# Patient Record
Sex: Male | Born: 1940
Health system: Southern US, Community
[De-identification: ages and names within clinical notes are randomized; demographics above are authoritative.]

## PROBLEM LIST (undated history)

## (undated) DIAGNOSIS — Z794 Long term (current) use of insulin: Secondary | ICD-10-CM

## (undated) DIAGNOSIS — Z973 Presence of spectacles and contact lenses: Secondary | ICD-10-CM

## (undated) DIAGNOSIS — M1990 Unspecified osteoarthritis, unspecified site: Secondary | ICD-10-CM

## (undated) DIAGNOSIS — Z972 Presence of dental prosthetic device (complete) (partial): Secondary | ICD-10-CM

## (undated) DIAGNOSIS — N182 Chronic kidney disease, stage 2 (mild): Secondary | ICD-10-CM

## (undated) DIAGNOSIS — H40003 Preglaucoma, unspecified, bilateral: Secondary | ICD-10-CM

## (undated) DIAGNOSIS — E782 Mixed hyperlipidemia: Secondary | ICD-10-CM

## (undated) DIAGNOSIS — N401 Enlarged prostate with lower urinary tract symptoms: Secondary | ICD-10-CM

## (undated) DIAGNOSIS — E119 Type 2 diabetes mellitus without complications: Secondary | ICD-10-CM

## (undated) DIAGNOSIS — E785 Hyperlipidemia, unspecified: Secondary | ICD-10-CM

## (undated) DIAGNOSIS — I10 Essential (primary) hypertension: Secondary | ICD-10-CM

## (undated) DIAGNOSIS — T8859XA Other complications of anesthesia, initial encounter: Secondary | ICD-10-CM

## (undated) HISTORY — PX: FOOT FRACTURE SURGERY: SHX645

## (undated) HISTORY — PX: OTHER SURGICAL HISTORY: SHX169

## (undated) HISTORY — DX: Essential (primary) hypertension: I10

## (undated) HISTORY — DX: Hyperlipidemia, unspecified: E78.5

## (undated) HISTORY — DX: Type 2 diabetes mellitus without complications: E11.9

---

## 2004-06-24 ENCOUNTER — Ambulatory Visit: Payer: Self-pay | Admitting: Family Medicine

## 2004-07-09 ENCOUNTER — Ambulatory Visit: Payer: Self-pay | Admitting: Family Medicine

## 2004-08-20 ENCOUNTER — Ambulatory Visit: Payer: Self-pay | Admitting: Family Medicine

## 2004-11-27 ENCOUNTER — Ambulatory Visit: Payer: Self-pay | Admitting: Family Medicine

## 2005-03-06 ENCOUNTER — Ambulatory Visit: Payer: Self-pay | Admitting: Family Medicine

## 2005-06-16 HISTORY — PX: INGUINAL HERNIA REPAIR: SUR1180

## 2005-06-17 ENCOUNTER — Ambulatory Visit: Payer: Self-pay | Admitting: Family Medicine

## 2005-07-15 ENCOUNTER — Ambulatory Visit: Payer: Self-pay | Admitting: Family Medicine

## 2005-10-02 ENCOUNTER — Ambulatory Visit: Payer: Self-pay | Admitting: Family Medicine

## 2006-01-08 ENCOUNTER — Ambulatory Visit: Payer: Self-pay | Admitting: Family Medicine

## 2006-04-10 ENCOUNTER — Ambulatory Visit: Payer: Self-pay | Admitting: Family Medicine

## 2006-04-16 ENCOUNTER — Ambulatory Visit: Payer: Self-pay | Admitting: Family Medicine

## 2006-04-30 ENCOUNTER — Ambulatory Visit: Payer: Self-pay | Admitting: Family Medicine

## 2006-09-30 ENCOUNTER — Ambulatory Visit: Payer: Self-pay | Admitting: Family Medicine

## 2006-12-08 ENCOUNTER — Ambulatory Visit: Payer: Self-pay | Admitting: Family Medicine

## 2008-11-20 ENCOUNTER — Ambulatory Visit (HOSPITAL_COMMUNITY): Admission: RE | Admit: 2008-11-20 | Discharge: 2008-11-20 | Payer: Self-pay | Admitting: Ophthalmology

## 2011-06-23 ENCOUNTER — Ambulatory Visit: Payer: Medicare PPO | Attending: Family Medicine | Admitting: Physical Therapy

## 2011-06-23 DIAGNOSIS — M545 Low back pain, unspecified: Secondary | ICD-10-CM | POA: Insufficient documentation

## 2011-06-23 DIAGNOSIS — IMO0001 Reserved for inherently not codable concepts without codable children: Secondary | ICD-10-CM | POA: Insufficient documentation

## 2011-06-23 DIAGNOSIS — R5381 Other malaise: Secondary | ICD-10-CM | POA: Insufficient documentation

## 2011-06-27 ENCOUNTER — Ambulatory Visit: Payer: Medicare PPO | Admitting: Physical Therapy

## 2011-07-02 ENCOUNTER — Ambulatory Visit: Payer: Medicare PPO | Admitting: Physical Therapy

## 2011-07-04 ENCOUNTER — Encounter: Payer: Medicare PPO | Admitting: *Deleted

## 2011-07-09 ENCOUNTER — Encounter: Payer: Medicare PPO | Admitting: Physical Therapy

## 2011-07-11 ENCOUNTER — Ambulatory Visit: Payer: Medicare PPO | Admitting: *Deleted

## 2011-07-16 ENCOUNTER — Ambulatory Visit: Payer: Medicare PPO | Admitting: Physical Therapy

## 2011-07-18 ENCOUNTER — Ambulatory Visit: Payer: Medicare PPO | Attending: Family Medicine | Admitting: Physical Therapy

## 2011-07-18 DIAGNOSIS — R5381 Other malaise: Secondary | ICD-10-CM | POA: Insufficient documentation

## 2011-07-18 DIAGNOSIS — IMO0001 Reserved for inherently not codable concepts without codable children: Secondary | ICD-10-CM | POA: Insufficient documentation

## 2011-07-18 DIAGNOSIS — M545 Low back pain, unspecified: Secondary | ICD-10-CM | POA: Insufficient documentation

## 2011-07-23 ENCOUNTER — Ambulatory Visit: Payer: Medicare PPO | Admitting: Physical Therapy

## 2011-07-25 ENCOUNTER — Ambulatory Visit: Payer: Medicare PPO | Admitting: Physical Therapy

## 2011-07-30 ENCOUNTER — Ambulatory Visit: Payer: Medicare PPO | Admitting: Physical Therapy

## 2011-08-01 ENCOUNTER — Ambulatory Visit: Payer: Medicare PPO | Admitting: *Deleted

## 2011-08-05 ENCOUNTER — Ambulatory Visit: Payer: Medicare PPO | Admitting: Physical Therapy

## 2011-08-08 ENCOUNTER — Encounter: Payer: Medicare PPO | Admitting: *Deleted

## 2013-11-02 DIAGNOSIS — E1121 Type 2 diabetes mellitus with diabetic nephropathy: Secondary | ICD-10-CM | POA: Insufficient documentation

## 2013-11-02 DIAGNOSIS — E1149 Type 2 diabetes mellitus with other diabetic neurological complication: Secondary | ICD-10-CM | POA: Insufficient documentation

## 2013-11-02 DIAGNOSIS — E119 Type 2 diabetes mellitus without complications: Secondary | ICD-10-CM | POA: Insufficient documentation

## 2013-11-02 DIAGNOSIS — B359 Dermatophytosis, unspecified: Secondary | ICD-10-CM | POA: Insufficient documentation

## 2013-11-02 DIAGNOSIS — I10 Essential (primary) hypertension: Secondary | ICD-10-CM | POA: Insufficient documentation

## 2014-08-17 ENCOUNTER — Encounter: Payer: Medicare HMO | Attending: "Endocrinology | Admitting: Nutrition

## 2014-08-17 DIAGNOSIS — E118 Type 2 diabetes mellitus with unspecified complications: Secondary | ICD-10-CM | POA: Insufficient documentation

## 2014-08-17 DIAGNOSIS — Z713 Dietary counseling and surveillance: Secondary | ICD-10-CM | POA: Diagnosis not present

## 2014-08-17 DIAGNOSIS — E1165 Type 2 diabetes mellitus with hyperglycemia: Secondary | ICD-10-CM

## 2014-08-17 DIAGNOSIS — IMO0002 Reserved for concepts with insufficient information to code with codable children: Secondary | ICD-10-CM

## 2014-08-17 NOTE — Progress Notes (Signed)
  Medical Nutrition Therapy:  Appt start time: 1400 end time:  1430.  Assessment:  Primary concerns today: Diabetes Type 2 Walk in from Dr. Dorris Fetch. Unable to obtain safety questions. Lives by himself and doesn't cook a lot. Willing to buy Healthy Choice or Smart Ones for meals. Likes to walk for exercise.   Unknown A1C other than it was more than 7% per patient.   PCP: Dr. Edrick Oh. Take medications as prescribed. PMH: Type 2 DM, HTN and Hyperlipidemia.  Preferred Learning Style:   Auditory  Visual  Hands on   Learning Readiness:  Not ready  Contemplating  Ready  Change in progress   MEDICATIONS: See list   DIETARY INTAKE:  Doesn't cook. Eats out a lot. Willing to change what he eats and make better choices to improve blood sugars. Wants to use Health Choice or Smart Ones for meals to improve food choices.  Usual physical activity: walks  Estimated energy needs: 1800 calories 200 g carbohydrates 135 g protein 50 g fat  Progress Towards Goal(s):  In progress.   Nutritional Diagnosis:  NB-1.1 Food and nutrition-related knowledge deficit As related to Diabetes.  As evidenced by A1C > 7%..    Intervention:  Nutrition counseling on meal planning, carb counting and MY Plate. Will educate on disease process and further education at next vist. Goals:  Follow The Plate Method  Choose Healthy Choice or Smart Ones for meals at lunch or dinner  Increase water intake  Increase fresh fruits and vegetables.  Cut out fried foods, sweets, cakes, cookies, pies, desserts and regular sodas or tea.  Do not skip meals.  Eat three meals per day  Monitor glucose levels as instructed by your doctor  Aim for 30 mins of physical activity daily  Write down what you are eating a few days before you come next visit.  Bring food record and glucose log to your next nutrition visit  Get A1C to 7% or below in the next three months.   Teaching Method Utilized:   Visual Auditory Hands on  Handouts given during visit include:  The Plate Method  The Meal Plan Card.  Barriers to learning/adherence to lifestyle change: none  Demonstrated degree of understanding via:  Teach Back   Monitoring/Evaluation:  Dietary intake, exercise, SBG and body weight in 1 month(s).

## 2014-08-17 NOTE — Patient Instructions (Signed)
Goals:  Follow The Plate Method  Choose Healthy Choice or Smart Ones for meals at lunch or dinner  Increase water intake  Increase fresh fruits and vegetables.  Cut out fried foods, sweets, cakes, cookies, pies, desserts and regular sodas or tea.  Do not skip meals.  Eat three meals per day  Monitor glucose levels as instructed by your doctor  Aim for 30 mins of physical activity daily  Write down what you are eating a few days before you come next visit.  Bring food record and glucose log to your next nutrition visit  Get A1C to 7% or below in the next three months.

## 2014-08-28 ENCOUNTER — Ambulatory Visit: Payer: Medicare PPO | Admitting: Nutrition

## 2014-09-18 ENCOUNTER — Encounter: Payer: Self-pay | Admitting: Nutrition

## 2014-09-18 ENCOUNTER — Encounter: Payer: Medicare HMO | Attending: "Endocrinology | Admitting: Nutrition

## 2014-09-18 VITALS — Ht 66.0 in | Wt 200.8 lb

## 2014-09-18 DIAGNOSIS — Z713 Dietary counseling and surveillance: Secondary | ICD-10-CM | POA: Diagnosis not present

## 2014-09-18 DIAGNOSIS — E118 Type 2 diabetes mellitus with unspecified complications: Secondary | ICD-10-CM | POA: Diagnosis present

## 2014-09-18 DIAGNOSIS — E1165 Type 2 diabetes mellitus with hyperglycemia: Secondary | ICD-10-CM

## 2014-09-18 DIAGNOSIS — IMO0002 Reserved for concepts with insufficient information to code with codable children: Secondary | ICD-10-CM

## 2014-09-18 DIAGNOSIS — E669 Obesity, unspecified: Secondary | ICD-10-CM | POA: Insufficient documentation

## 2014-09-18 DIAGNOSIS — Z6832 Body mass index (BMI) 32.0-32.9, adult: Secondary | ICD-10-CM | POA: Diagnosis not present

## 2014-09-18 NOTE — Patient Instructions (Signed)
Goals: Keep up the good work!!    Continue to Follow The Plate Method  Choose Healthy Choice or Smart Ones for meals at lunch or dinner  Increase fresh fruits and vegetables.  Cut out fried foods, sweets, cakes, cookies, pies, desserts and regular sodas or tea.  Do not skip meals.  Eat three meals per day  Aim for 30 mins of physical activity daily  Lose 2-3 lbs per month  Get A1C to 7% or below in the next three months.

## 2014-09-18 NOTE — Progress Notes (Signed)
  Medical Nutrition Therapy:  Appt start time: 1100 end time:  1115.  Assessment:  Primary concerns today: Diabetes Type 2 Cut out diet sodas, cut out sweets, desserts and fried foods. Eats Lean Cuisine for lunch and dinner since he doesn't cook much.  Occasionally testing blood sugars:  FBS 160's  A1C was 7%. Hasn't had it taken again til May. Has stopped taking Glipizide and has cut down of Metformin now 500 mg twice a day. Still taking Invokana 300 mg once a day. Drinking lots of water.   Has back problems and limited with walking. Goes to [employer] and works out. Feels better and stronger and has lost  6 lbs since last visit.     Taking 4000 mg fish oil daily. (Lovaza) and low dose Crestor.  Walk in from Dr. Dorris Fetch. Unable to obtain safety questions. Lives by himself and doesn't cook a lot. Willing to buy Healthy Choice or Smart Ones for meals. Likes to walk for exercise.  Most recent A1C was 8.2% per patient. Suppose to get it checked again in May with Dr. Dorris Fetch.  PCP: Dr. Edrick Oh. Take medications as prescribed. PMH: Type 2 DM, HTN and Hyperlipidemia.       Diet needs more fruit intake and more lower carb vegetables.   Preferred Learning Style:   Auditory  Visual  Hands on   Learning Readiness:  Ready  Change in progress   MEDICATIONS: See list   DIETARY INTAKE:  Has made a lot of good chances with his diet. Cut out junk food,sweets and processed foods for the most part.   Breakfast:  Old Fashion Oatmeal with apples, Or cherrios with Skim milk   Lunch:  Lean Cuisine OR boiled egg or sweet potato1/2, toss salad,  Water or UnSweet tea  Dinner:  [employer] Cruisine and boiled egg, and toss salad  Usual physical activity: walks  Estimated energy needs: 1800 calories 200 g carbohydrates 135 g protein 50 g fat  Progress Towards Goal(s):  In progress.   Nutritional Diagnosis:  NB-1.1 Food and nutrition-related knowledge deficit As related to Diabetes.  As evidenced by A1C >  7%..    Intervention:  Nutrition counseling on meal planning, carb counting and MY Plate. Stressed need for increased fruit and low carb vegetables.  Goals: Keep up the good work!!    Continue to Follow The Plate Method  Choose Healthy Choice or Smart Ones for meals at lunch or dinner  Increase fresh fruits and vegetables.  Cut out fried foods, sweets, cakes, cookies, pies, desserts and regular sodas or tea.  Do not skip meals.  Eat three meals per day  Aim for 30 mins of physical activity daily  Lose 2-3 lbs per month  Get A1C to 7% or below in the next three months.   Teaching Method Utilized:  Visual Auditory Hands on  Handouts given during visit include:  The Plate Method  The Meal Plan Card.  Barriers to learning/adherence to lifestyle change: none  Demonstrated degree of understanding via:  Teach Back   Monitoring/Evaluation:  Dietary intake, exercise, SBG and body weight in PRN.Marland Kitchen

## 2015-05-16 ENCOUNTER — Ambulatory Visit (INDEPENDENT_AMBULATORY_CARE_PROVIDER_SITE_OTHER): Payer: Medicare HMO | Admitting: "Endocrinology

## 2015-05-16 ENCOUNTER — Encounter: Payer: Self-pay | Admitting: "Endocrinology

## 2015-05-16 VITALS — BP 115/70 | HR 74 | Ht 66.0 in | Wt 195.0 lb

## 2015-05-16 DIAGNOSIS — I10 Essential (primary) hypertension: Secondary | ICD-10-CM | POA: Diagnosis not present

## 2015-05-16 DIAGNOSIS — IMO0001 Reserved for inherently not codable concepts without codable children: Secondary | ICD-10-CM | POA: Insufficient documentation

## 2015-05-16 DIAGNOSIS — E1165 Type 2 diabetes mellitus with hyperglycemia: Secondary | ICD-10-CM

## 2015-05-16 DIAGNOSIS — E785 Hyperlipidemia, unspecified: Secondary | ICD-10-CM | POA: Insufficient documentation

## 2015-05-16 DIAGNOSIS — E7800 Pure hypercholesterolemia, unspecified: Secondary | ICD-10-CM | POA: Insufficient documentation

## 2015-05-16 MED ORDER — CANAGLIFLOZIN 100 MG PO TABS: 100.0000 mg | ORAL_TABLET | Freq: Every day | ORAL | Status: DC

## 2015-05-16 NOTE — Patient Instructions (Signed)

## 2015-05-16 NOTE — Progress Notes (Signed)
Subjective:    Patient ID: Patrick Moss, male    DOB: 1941-03-26, PCP Sherrie Mustache, MD   Past Medical History  Diagnosis Date  . Diabetes mellitus without complication ([place])    No past surgical history on file. Social History   Social History  . Marital Status: Divorced    Spouse Name: N/A  . Number of Children: N/A  . Years of Education: N/A   Social History Main Topics  . Smoking status: Never Smoker   . Smokeless tobacco: None  . Alcohol Use: None  . Drug Use: None  . Sexual Activity: Not Asked   Other Topics Concern  . None   Social History Narrative   Outpatient Encounter Prescriptions as of 05/16/2015  Medication Sig  . amLODipine (NORVASC) 5 MG tablet Take 5 mg by mouth daily.  Marland Kitchen aspirin 81 MG tablet Take 81 mg by mouth daily.  . canagliflozin (INVOKANA) 100 MG TABS tablet Take 1 tablet (100 mg total) by mouth daily before breakfast.  . losartan-hydrochlorothiazide (HYZAAR) 100-25 MG per tablet Take 1 tablet by mouth daily.  . metformin (FORTAMET) 1000 MG (OSM) 24 hr tablet Take 1,000 mg by mouth daily with breakfast.  . omega-3 acid ethyl esters (LOVAZA) 1 G capsule Take by mouth 2 (two) times daily.  . Omega-3 Fatty Acids (FISH OIL) 1000 MG CAPS Take 1,000 mg by mouth 2 (two) times daily.  . rosuvastatin (CRESTOR) 5 MG tablet Take 5 mg by mouth daily.  . [DISCONTINUED] canagliflozin (INVOKANA) 300 MG TABS tablet Take 300 mg by mouth daily before breakfast.  . [DISCONTINUED] glipiZIDE (GLUCOTROL) 5 MG tablet Take by mouth daily before breakfast.   No facility-administered encounter medications on file as of 05/16/2015.   ALLERGIES: Allergies not on file VACCINATION STATUS:  There is no immunization history on file for this patient.  Diabetes He presents for his follow-up diabetic visit. He has type 2 diabetes mellitus. Onset time: He was diagnosed at approximate age of 72 years. His disease course has been improving. There are no  hypoglycemic associated symptoms. Pertinent negatives for hypoglycemia include no confusion, headaches, pallor or seizures. There are no diabetic associated symptoms. Pertinent negatives for diabetes include no chest pain, no fatigue, no polydipsia, no polyphagia, no polyuria and no weakness. There are no hypoglycemic complications. Symptoms are improving. There are no diabetic complications. Risk factors for coronary artery disease include diabetes mellitus, dyslipidemia, male sex, obesity and sedentary lifestyle. He is compliant with treatment most of the time. His weight is decreasing steadily. He is following a generally unhealthy diet. He participates in exercise intermittently. His overall blood glucose range is 140-180 mg/dl. An ACE inhibitor/angiotensin II receptor blocker is being taken.  Hyperlipidemia This is a chronic problem. The current episode started more than 1 year ago. Pertinent negatives include no chest pain, myalgias or shortness of breath. Current antihyperlipidemic treatment includes statins. Risk factors for coronary artery disease include dyslipidemia, hypertension, male sex, obesity and a sedentary lifestyle.  Hypertension Pertinent negatives include no chest pain, headaches, neck pain, palpitations or shortness of breath.     Review of Systems  Constitutional: Negative for fatigue and unexpected weight change.  HENT: Negative for dental problem, mouth sores and trouble swallowing.   Eyes: Negative for visual disturbance.  Respiratory: Negative for cough, choking, chest tightness, shortness of breath and wheezing.   Cardiovascular: Negative for chest pain, palpitations and leg swelling.  Gastrointestinal: Negative for nausea, vomiting, abdominal pain, diarrhea, constipation and abdominal  distention.  Endocrine: Negative for polydipsia, polyphagia and polyuria.  Genitourinary: Negative for dysuria, urgency, hematuria and flank pain.  Musculoskeletal: Negative for myalgias,  back pain, gait problem and neck pain.  Skin: Negative for pallor, rash and wound.  Neurological: Negative for seizures, syncope, weakness, numbness and headaches.  Psychiatric/Behavioral: Negative.  Negative for confusion and dysphoric mood.    Objective:    BP 115/70 mmHg  Pulse 74  Ht 5\' 6"  (1.676 m)  Wt 195 lb (88.451 kg)  BMI 31.49 kg/m2  Wt Readings from Last 3 Encounters:  05/16/15 195 lb (88.451 kg)  09/18/14 200 lb 12.8 oz (91.082 kg)    Physical Exam  Constitutional: He is oriented to person, place, and time. He appears well-developed and well-nourished. He is cooperative. No distress.  HENT:  Head: Normocephalic and atraumatic.  Eyes: EOM are normal.  Neck: Normal range of motion. Neck supple. No tracheal deviation present. No thyromegaly present.  Cardiovascular: Normal rate, S1 normal, S2 normal and normal heart sounds.  Exam reveals no gallop.   No murmur heard. Pulses:      Dorsalis pedis pulses are 1+ on the right side, and 1+ on the left side.       Posterior tibial pulses are 1+ on the right side, and 1+ on the left side.  Pulmonary/Chest: Breath sounds normal. No respiratory distress. He has no wheezes.  Abdominal: Soft. Bowel sounds are normal. He exhibits no distension. There is no tenderness. There is no guarding and no CVA tenderness.  Musculoskeletal: He exhibits no edema.       Right shoulder: He exhibits no swelling and no deformity.  Neurological: He is alert and oriented to person, place, and time. He has normal strength and normal reflexes. No cranial nerve deficit or sensory deficit. Gait normal.  Skin: Skin is warm and dry. No rash noted. No cyanosis. Nails show no clubbing.  Psychiatric: He has a normal mood and affect. His speech is normal and behavior is normal. Judgment and thought content normal. Cognition and memory are normal.    Assessment & Plan:   1. Uncontrolled type 2 diabetes mellitus without complication, without long-term current  use of insulin (HCC)   Pt remains at a high risk for more acute and chronic complications of diabetes which include CAD, CVA, CKD, retinopathy, and neuropathy. These are all discussed in detail with the patient.  Patient came with improved A1c of 7.6%.  Recent labs reviewed.   - I have re-counseled the patient on diet management and weight loss  by adopting a carbohydrate restricted / protein rich  Diet.  - Suggestion is made for patient to avoid simple carbohydrates   from their diet including Cakes , Desserts, Ice Cream,  Soda (  diet and regular) , Sweet Tea , Candies,  Chips, Cookies, Artificial Sweeteners,   and "Sugar-free" Products .  This will help patient to have stable blood glucose profile and potentially avoid unintended  Weight gain.  - Patient is advised to stick to a routine mealtimes to eat 3 meals  a day and avoid unnecessary snacks ( to snack only to correct hypoglycemia).  - The patient  has been  scheduled with Jearld Fenton, RDN, CDE for individualized DM education.  - I have approached patient with the following individualized plan to manage diabetes and patient agrees.  -I will continue MTF ER 1000mg  po qday, Invokana  100 mg po qam.  - SE discussed with the patient. He will need basal  insulin if he  loses control  - Patient specific target  for A1c; LDL, HDL, Triglycerides, and  Waist Circumference were discussed in detail.  2) BP/HTN: Controlled. Continue current medications including ACEI/ARB. 3) Lipids/HPL:  continue statins. 4)  Weight/Diet: CDE consult in progress, exercise, and carbohydrates information provided.  5) Chronic Care/Health Maintenance:  -Patient is on ACEI/ARB and Statin medications and encouraged to continue to follow up with Ophthalmology, Podiatrist at least yearly or according to recommendations, and advised to  stay away from smoking. I have recommended yearly flu vaccine and pneumonia vaccination at least every 5 years; moderate intensity  exercise for up to 150 minutes weekly; and  sleep for at least 7 hours a day.  - 25 minutes of time was spent on the care of this patient , 50% of which was applied for counseling on diabetes complications and their preventions.  - I advised patient to maintain close follow up with Sherrie Mustache, MD for primary care needs.  Patient is asked to bring meter and  blood glucose logs during their next visit.   Follow up plan: -Return in about 3 months (around 08/14/2015) for follow up with pre-visit labs, diabetes, high blood pressure, high cholesterol.  Glade Lloyd, MD Phone: [phone]  Fax: [phone]   05/16/2015, 2:19 PM

## 2015-08-14 ENCOUNTER — Ambulatory Visit: Payer: Medicare HMO | Admitting: "Endocrinology

## 2015-09-12 ENCOUNTER — Ambulatory Visit (INDEPENDENT_AMBULATORY_CARE_PROVIDER_SITE_OTHER): Payer: Medicare HMO | Admitting: "Endocrinology

## 2015-09-12 ENCOUNTER — Encounter: Payer: Self-pay | Admitting: "Endocrinology

## 2015-09-12 VITALS — BP 124/78 | HR 69 | Ht 66.0 in | Wt 189.0 lb

## 2015-09-12 DIAGNOSIS — E785 Hyperlipidemia, unspecified: Secondary | ICD-10-CM

## 2015-09-12 DIAGNOSIS — I10 Essential (primary) hypertension: Secondary | ICD-10-CM | POA: Diagnosis not present

## 2015-09-12 DIAGNOSIS — E1165 Type 2 diabetes mellitus with hyperglycemia: Secondary | ICD-10-CM

## 2015-09-12 DIAGNOSIS — IMO0001 Reserved for inherently not codable concepts without codable children: Secondary | ICD-10-CM

## 2015-09-12 NOTE — Patient Instructions (Signed)

## 2015-09-12 NOTE — Progress Notes (Signed)
Subjective:    Patient ID: Patrick Moss, male    DOB: 03-18-1941, PCP Sherrie Mustache, MD   Past Medical History  Diagnosis Date  . Diabetes mellitus without complication ([place])   . Hyperlipidemia   . Hypertension    Past Surgical History  Procedure Laterality Date  . R foot fx     Social History   Social History  . Marital Status: Divorced    Spouse Name: N/A  . Number of Children: N/A  . Years of Education: N/A   Social History Main Topics  . Smoking status: Never Smoker   . Smokeless tobacco: Not on file  . Alcohol Use: Not on file  . Drug Use: Not on file  . Sexual Activity: Not on file   Other Topics Concern  . Not on file   Social History Narrative   Outpatient Encounter Prescriptions as of 09/12/2015  Medication Sig  . amLODipine (NORVASC) 5 MG tablet Take 5 mg by mouth daily.  Marland Kitchen aspirin 81 MG tablet Take 81 mg by mouth daily.  . canagliflozin (INVOKANA) 100 MG TABS tablet Take 1 tablet (100 mg total) by mouth daily before breakfast.  . losartan-hydrochlorothiazide (HYZAAR) 100-25 MG per tablet Take 1 tablet by mouth daily.  . metFORMIN (GLUCOPHAGE-XR) 500 MG 24 hr tablet Take 1,000 mg by mouth daily after breakfast.  . Omega-3 Fatty Acids (FISH OIL) 1000 MG CAPS Take 1,000 mg by mouth 2 (two) times daily.  . rosuvastatin (CRESTOR) 5 MG tablet Take 5 mg by mouth daily.  . [DISCONTINUED] metformin (FORTAMET) 1000 MG (OSM) 24 hr tablet Take 1,000 mg by mouth daily with breakfast.  . [DISCONTINUED] omega-3 acid ethyl esters (LOVAZA) 1 G capsule Take by mouth 2 (two) times daily.   No facility-administered encounter medications on file as of 09/12/2015.   ALLERGIES: No Known Allergies VACCINATION STATUS:  There is no immunization history on file for this patient.  Diabetes He presents for his follow-up diabetic visit. He has type 2 diabetes mellitus. Onset time: He was diagnosed at approximate age of 58 years. His disease course has been  improving. There are no hypoglycemic associated symptoms. Pertinent negatives for hypoglycemia include no confusion, headaches, pallor or seizures. There are no diabetic associated symptoms. Pertinent negatives for diabetes include no chest pain, no fatigue, no polydipsia, no polyphagia, no polyuria and no weakness. There are no hypoglycemic complications. Symptoms are improving. There are no diabetic complications. Risk factors for coronary artery disease include diabetes mellitus, dyslipidemia, male sex, obesity and sedentary lifestyle. He is compliant with treatment most of the time. His weight is decreasing steadily. He is following a generally unhealthy diet. He participates in exercise intermittently. His overall blood glucose range is 140-180 mg/dl. An ACE inhibitor/angiotensin II receptor blocker is being taken.  Hyperlipidemia This is a chronic problem. The current episode started more than 1 year ago. Pertinent negatives include no chest pain, myalgias or shortness of breath. Current antihyperlipidemic treatment includes statins. Risk factors for coronary artery disease include dyslipidemia, hypertension, male sex, obesity and a sedentary lifestyle.  Hypertension Pertinent negatives include no chest pain, headaches, neck pain, palpitations or shortness of breath.     Review of Systems  Constitutional: Negative for fatigue and unexpected weight change.  HENT: Negative for dental problem, mouth sores and trouble swallowing.   Eyes: Negative for visual disturbance.  Respiratory: Negative for cough, choking, chest tightness, shortness of breath and wheezing.   Cardiovascular: Negative for chest pain, palpitations and  leg swelling.  Gastrointestinal: Negative for nausea, vomiting, abdominal pain, diarrhea, constipation and abdominal distention.  Endocrine: Negative for polydipsia, polyphagia and polyuria.  Genitourinary: Negative for dysuria, urgency, hematuria and flank pain.  Musculoskeletal:  Negative for myalgias, back pain, gait problem and neck pain.  Skin: Negative for pallor, rash and wound.  Neurological: Negative for seizures, syncope, weakness, numbness and headaches.  Psychiatric/Behavioral: Negative.  Negative for confusion and dysphoric mood.    Objective:    BP 124/78 mmHg  Pulse 69  Ht 5\' 6"  (1.676 m)  Wt 189 lb (85.73 kg)  BMI 30.52 kg/m2  SpO2 98%  Wt Readings from Last 3 Encounters:  09/12/15 189 lb (85.73 kg)  05/16/15 195 lb (88.451 kg)  09/18/14 200 lb 12.8 oz (91.082 kg)    Physical Exam  Constitutional: He is oriented to person, place, and time. He appears well-developed and well-nourished. He is cooperative. No distress.  HENT:  Head: Normocephalic and atraumatic.  Eyes: EOM are normal.  Neck: Normal range of motion. Neck supple. No tracheal deviation present. No thyromegaly present.  Cardiovascular: Normal rate, S1 normal, S2 normal and normal heart sounds.  Exam reveals no gallop.   No murmur heard. Pulses:      Dorsalis pedis pulses are 1+ on the right side, and 1+ on the left side.       Posterior tibial pulses are 1+ on the right side, and 1+ on the left side.  Pulmonary/Chest: Breath sounds normal. No respiratory distress. He has no wheezes.  Abdominal: Soft. Bowel sounds are normal. He exhibits no distension. There is no tenderness. There is no guarding and no CVA tenderness.  Musculoskeletal: He exhibits no edema.       Right shoulder: He exhibits no swelling and no deformity.  Neurological: He is alert and oriented to person, place, and time. He has normal strength and normal reflexes. No cranial nerve deficit or sensory deficit. Gait normal.  Skin: Skin is warm and dry. No rash noted. No cyanosis. Nails show no clubbing.  Psychiatric: He has a normal mood and affect. His speech is normal and behavior is normal. Judgment and thought content normal. Cognition and memory are normal.    Assessment & Plan:   1. Uncontrolled type 2  diabetes mellitus without complication, without long-term current use of insulin (HCC)   Pt remains at a high risk for more acute and chronic complications of diabetes which include CAD, CVA, CKD, retinopathy, and neuropathy. These are all discussed in detail with the patient.  Patient came with improved A1c of 7.3% Overall improving from 8.9%.  Recent labs reviewed.   - I have re-counseled the patient on diet management and weight loss  by adopting a carbohydrate restricted / protein rich  Diet.  - Suggestion is made for patient to avoid simple carbohydrates   from their diet including Cakes , Desserts, Ice Cream,  Soda (  diet and regular) , Sweet Tea , Candies,  Chips, Cookies, Artificial Sweeteners,   and "Sugar-free" Products .  This will help patient to have stable blood glucose profile and potentially avoid unintended  Weight gain.  - Patient is advised to stick to a routine mealtimes to eat 3 meals  a day and avoid unnecessary snacks ( to snack only to correct hypoglycemia).  - The patient  has been  scheduled with Jearld Fenton, RDN, CDE for individualized DM education.  - I have approached patient with the following individualized plan to manage diabetes and patient  agrees.  -I will continue MTF ER 1000mg  po qday, Invokana  100 mg po qam.  - SE discussed with the patient. He will need basal insulin if he  loses control  - Patient specific target  for A1c; LDL, HDL, Triglycerides, and  Waist Circumference were discussed in detail.  2) BP/HTN: Controlled. Continue current medications including ACEI/ARB. 3) Lipids/HPL:  continue statins. 4)  Weight/Diet: CDE consult in progress, exercise, and carbohydrates information provided.  5) Chronic Care/Health Maintenance:  -Patient is on ACEI/ARB and Statin medications and encouraged to continue to follow up with Ophthalmology, Podiatrist at least yearly or according to recommendations, and advised to  stay away from smoking. I have  recommended yearly flu vaccine and pneumonia vaccination at least every 5 years; moderate intensity exercise for up to 150 minutes weekly; and  sleep for at least 7 hours a day.  - 25 minutes of time was spent on the care of this patient , 50% of which was applied for counseling on diabetes complications and their preventions.  - I advised patient to maintain close follow up with Sherrie Mustache, MD for primary care needs.  Patient is asked to bring meter and  blood glucose logs during their next visit.   Follow up plan: -Return in about 3 months (around 12/13/2015) for diabetes, high blood pressure, high cholesterol, follow up with pre-visit labs.  Glade Lloyd, MD Phone: [phone]  Fax: [phone]   09/12/2015, 4:35 PM

## 2015-12-05 LAB — HEMOGLOBIN A1C: HEMOGLOBIN A1C: 8.5

## 2015-12-17 ENCOUNTER — Ambulatory Visit (INDEPENDENT_AMBULATORY_CARE_PROVIDER_SITE_OTHER): Payer: Medicare HMO | Admitting: "Endocrinology

## 2015-12-17 ENCOUNTER — Encounter: Payer: Self-pay | Admitting: "Endocrinology

## 2015-12-17 VITALS — BP 141/80 | HR 72 | Ht 66.0 in | Wt 195.0 lb

## 2015-12-17 DIAGNOSIS — E1165 Type 2 diabetes mellitus with hyperglycemia: Secondary | ICD-10-CM

## 2015-12-17 DIAGNOSIS — E785 Hyperlipidemia, unspecified: Secondary | ICD-10-CM | POA: Diagnosis not present

## 2015-12-17 DIAGNOSIS — IMO0001 Reserved for inherently not codable concepts without codable children: Secondary | ICD-10-CM

## 2015-12-17 DIAGNOSIS — I10 Essential (primary) hypertension: Secondary | ICD-10-CM

## 2015-12-17 NOTE — Progress Notes (Signed)
Subjective:    Patient ID: Patrick Moss, male    DOB: 02/14/1941, PCP Sherrie Mustache, MD   Past Medical History  Diagnosis Date  . Diabetes mellitus without complication ([place])   . Hyperlipidemia   . Hypertension    Past Surgical History  Procedure Laterality Date  . R foot fx     Social History   Social History  . Marital Status: Divorced    Spouse Name: N/A  . Number of Children: N/A  . Years of Education: N/A   Social History Main Topics  . Smoking status: Never Smoker   . Smokeless tobacco: None  . Alcohol Use: None  . Drug Use: None  . Sexual Activity: Not Asked   Other Topics Concern  . None   Social History Narrative   Outpatient Encounter Prescriptions as of 12/17/2015  Medication Sig  . amLODipine (NORVASC) 5 MG tablet Take 5 mg by mouth daily.  Marland Kitchen aspirin 81 MG tablet Take 81 mg by mouth daily.  . canagliflozin (INVOKANA) 100 MG TABS tablet Take 1 tablet (100 mg total) by mouth daily before breakfast.  . losartan-hydrochlorothiazide (HYZAAR) 100-25 MG per tablet Take 1 tablet by mouth daily.  . metFORMIN (GLUCOPHAGE-XR) 500 MG 24 hr tablet Take 1,000 mg by mouth daily after breakfast.  . Omega-3 Fatty Acids (FISH OIL) 1000 MG CAPS Take 1,000 mg by mouth 2 (two) times daily.  . rosuvastatin (CRESTOR) 5 MG tablet Take 5 mg by mouth daily.   No facility-administered encounter medications on file as of 12/17/2015.   ALLERGIES: No Known Allergies VACCINATION STATUS:  There is no immunization history on file for this patient.  Diabetes He presents for his follow-up diabetic visit. He has type 2 diabetes mellitus. Onset time: He was diagnosed at approximate age of 80 years. His disease course has been worsening. There are no hypoglycemic associated symptoms. Pertinent negatives for hypoglycemia include no confusion, headaches, pallor or seizures. There are no diabetic associated symptoms. Pertinent negatives for diabetes include no chest pain, no  fatigue, no polydipsia, no polyphagia, no polyuria and no weakness. There are no hypoglycemic complications. Symptoms are worsening. There are no diabetic complications. Risk factors for coronary artery disease include diabetes mellitus, dyslipidemia, male sex, obesity and sedentary lifestyle. He is compliant with treatment most of the time. His weight is decreasing steadily. He is following a generally unhealthy diet. He participates in exercise intermittently. An ACE inhibitor/angiotensin II receptor blocker is being taken.  Hyperlipidemia This is a chronic problem. The current episode started more than 1 year ago. Pertinent negatives include no chest pain, myalgias or shortness of breath. Current antihyperlipidemic treatment includes statins. Risk factors for coronary artery disease include dyslipidemia, hypertension, male sex, obesity and a sedentary lifestyle.  Hypertension Pertinent negatives include no chest pain, headaches, neck pain, palpitations or shortness of breath.     Review of Systems  Constitutional: Negative for fatigue and unexpected weight change.  HENT: Negative for dental problem, mouth sores and trouble swallowing.   Eyes: Negative for visual disturbance.  Respiratory: Negative for cough, choking, chest tightness, shortness of breath and wheezing.   Cardiovascular: Negative for chest pain, palpitations and leg swelling.  Gastrointestinal: Negative for nausea, vomiting, abdominal pain, diarrhea, constipation and abdominal distention.  Endocrine: Negative for polydipsia, polyphagia and polyuria.  Genitourinary: Negative for dysuria, urgency, hematuria and flank pain.  Musculoskeletal: Negative for myalgias, back pain, gait problem and neck pain.  Skin: Negative for pallor, rash and wound.  Neurological: Negative for seizures, syncope, weakness, numbness and headaches.  Psychiatric/Behavioral: Negative.  Negative for confusion and dysphoric mood.    Objective:    BP  141/80 mmHg  Pulse 72  Ht 5\' 6"  (1.676 m)  Wt 195 lb (88.451 kg)  BMI 31.49 kg/m2  Wt Readings from Last 3 Encounters:  12/17/15 195 lb (88.451 kg)  09/12/15 189 lb (85.73 kg)  05/16/15 195 lb (88.451 kg)    Physical Exam  Constitutional: He is oriented to person, place, and time. He appears well-developed and well-nourished. He is cooperative. No distress.  HENT:  Head: Normocephalic and atraumatic.  Eyes: EOM are normal.  Neck: Normal range of motion. Neck supple. No tracheal deviation present. No thyromegaly present.  Cardiovascular: Normal rate, S1 normal, S2 normal and normal heart sounds.  Exam reveals no gallop.   No murmur heard. Pulses:      Dorsalis pedis pulses are 1+ on the right side, and 1+ on the left side.       Posterior tibial pulses are 1+ on the right side, and 1+ on the left side.  Pulmonary/Chest: Breath sounds normal. No respiratory distress. He has no wheezes.  Abdominal: Soft. Bowel sounds are normal. He exhibits no distension. There is no tenderness. There is no guarding and no CVA tenderness.  Musculoskeletal: He exhibits no edema.       Right shoulder: He exhibits no swelling and no deformity.  Neurological: He is alert and oriented to person, place, and time. He has normal strength and normal reflexes. No cranial nerve deficit or sensory deficit. Gait normal.  Skin: Skin is warm and dry. No rash noted. No cyanosis. Nails show no clubbing.  Psychiatric: He has a normal mood and affect. His speech is normal and behavior is normal. Judgment and thought content normal. Cognition and memory are normal.    Assessment & Plan:   1. Uncontrolled type 2 diabetes mellitus without complication, without long-term current use of insulin (HCC)   Pt remains at a high risk for more acute and chronic complications of diabetes which include CAD, CVA, CKD, retinopathy, and neuropathy. These are all discussed in detail with the patient.  Patient came with  A1c of 8.5%  increasing from  7.3% ,  after Overall improving from 8.9%.  Recent labs reviewed.   - I have re-counseled the patient on diet management and weight loss  by adopting a carbohydrate restricted / protein rich  Diet.  - Suggestion is made for patient to avoid simple carbohydrates   from their diet including Cakes , Desserts, Ice Cream,  Soda (  diet and regular) , Sweet Tea , Candies,  Chips, Cookies, Artificial Sweeteners,   and "Sugar-free" Products .  This will help patient to have stable blood glucose profile and potentially avoid unintended  Weight gain.  - Patient is advised to stick to a routine mealtimes to eat 3 meals  a day and avoid unnecessary snacks ( to snack only to correct hypoglycemia).  - The patient  has been  scheduled with Jearld Fenton, RDN, CDE for individualized DM education.  - I have approached patient with the following individualized plan to manage diabetes and patient agrees.  -I will continue MTF ER 1000mg  po qday, Invokana  100 mg po qam.  - he will start monitoring blood glucose before meals and at bedtime and return in 1 week for reevaluation. -If his glycemic burden is significantly above target, he will be considered for brief period of  therapy with insulin.  - Patient specific target  for A1c; LDL, HDL, Triglycerides, and  Waist Circumference were discussed in detail.  2) BP/HTN: Controlled. Continue current medications including ACEI/ARB. 3) Lipids/HPL:  continue statins. 4)  Weight/Diet: CDE consult in progress, exercise, and carbohydrates information provided.  5) Chronic Care/Health Maintenance:  -Patient is on ACEI/ARB and Statin medications and encouraged to continue to follow up with Ophthalmology, Podiatrist at least yearly or according to recommendations, and advised to  stay away from smoking. I have recommended yearly flu vaccine and pneumonia vaccination at least every 5 years; moderate intensity exercise for up to 150 minutes weekly; and  sleep  for at least 7 hours a day.  - 25 minutes of time was spent on the care of this patient , 50% of which was applied for counseling on diabetes complications and their preventions.  - I advised patient to maintain close follow up with Sherrie Mustache, MD for primary care needs.  Patient is asked to bring meter and  blood glucose logs during their next visit.   Follow up plan: -Return in about 1 week (around 12/24/2015) for follow up with meter and logs- no labs.  Glade Lloyd, MD Phone: [phone]  Fax: [phone]   12/17/2015, 11:48 AM

## 2015-12-28 ENCOUNTER — Ambulatory Visit (INDEPENDENT_AMBULATORY_CARE_PROVIDER_SITE_OTHER): Payer: Medicare HMO | Admitting: "Endocrinology

## 2015-12-28 ENCOUNTER — Encounter: Payer: Self-pay | Admitting: "Endocrinology

## 2015-12-28 VITALS — BP 134/74 | HR 72 | Ht 66.0 in | Wt 194.0 lb

## 2015-12-28 DIAGNOSIS — IMO0001 Reserved for inherently not codable concepts without codable children: Secondary | ICD-10-CM

## 2015-12-28 DIAGNOSIS — E785 Hyperlipidemia, unspecified: Secondary | ICD-10-CM | POA: Diagnosis not present

## 2015-12-28 DIAGNOSIS — E1165 Type 2 diabetes mellitus with hyperglycemia: Secondary | ICD-10-CM | POA: Diagnosis not present

## 2015-12-28 DIAGNOSIS — I10 Essential (primary) hypertension: Secondary | ICD-10-CM

## 2015-12-28 NOTE — Progress Notes (Signed)
Subjective:    Patient ID: Patrick Moss, male    DOB: [birth year], PCP Sherrie Mustache, MD   Past Medical History  Diagnosis Date  . Diabetes mellitus without complication ([place])   . Hyperlipidemia   . Hypertension    Past Surgical History  Procedure Laterality Date  . R foot fx     Social History   Social History  . Marital Status: Divorced    Spouse Name: N/A  . Number of Children: N/A  . Years of Education: N/A   Social History Main Topics  . Smoking status: Never Smoker   . Smokeless tobacco: None  . Alcohol Use: None  . Drug Use: None  . Sexual Activity: Not Asked   Other Topics Concern  . None   Social History Narrative   Outpatient Encounter Prescriptions as of 12/28/2015  Medication Sig  . amLODipine (NORVASC) 5 MG tablet Take 5 mg by mouth daily.  Marland Kitchen aspirin 81 MG tablet Take 81 mg by mouth daily.  . canagliflozin (INVOKANA) 100 MG TABS tablet Take 1 tablet (100 mg total) by mouth daily before breakfast.  . losartan-hydrochlorothiazide (HYZAAR) 100-25 MG per tablet Take 1 tablet by mouth daily.  . metFORMIN (GLUCOPHAGE-XR) 500 MG 24 hr tablet Take 1,000 mg by mouth daily after breakfast.  . Omega-3 Fatty Acids (FISH OIL) 1000 MG CAPS Take 1,000 mg by mouth 2 (two) times daily.  . rosuvastatin (CRESTOR) 5 MG tablet Take 5 mg by mouth daily.   No facility-administered encounter medications on file as of 12/28/2015.   ALLERGIES: No Known Allergies VACCINATION STATUS:  There is no immunization history on file for this patient.  Diabetes He presents for his follow-up diabetic visit. He has type 2 diabetes mellitus. Onset time: He was diagnosed at approximate age of 60 years. His disease course has been improving. There are no hypoglycemic associated symptoms. Pertinent negatives for hypoglycemia include no confusion, headaches, pallor or seizures. There are no diabetic associated symptoms. Pertinent negatives for diabetes include no chest pain, no  fatigue, no polydipsia, no polyphagia, no polyuria and no weakness. There are no hypoglycemic complications. Symptoms are improving. There are no diabetic complications. Risk factors for coronary artery disease include diabetes mellitus, dyslipidemia, male sex, obesity and sedentary lifestyle. He is compliant with treatment most of the time. His weight is stable. He is following a generally unhealthy diet. He participates in exercise intermittently. His home blood glucose trend is decreasing steadily. His breakfast blood glucose range is generally 180-200 mg/dl. His overall blood glucose range is 180-200 mg/dl. An ACE inhibitor/angiotensin II receptor blocker is being taken.  Hyperlipidemia This is a chronic problem. The current episode started more than 1 year ago. Pertinent negatives include no chest pain, myalgias or shortness of breath. Current antihyperlipidemic treatment includes statins. Risk factors for coronary artery disease include dyslipidemia, hypertension, male sex, obesity and a sedentary lifestyle.  Hypertension Pertinent negatives include no chest pain, headaches, neck pain, palpitations or shortness of breath.     Review of Systems  Constitutional: Negative for fatigue and unexpected weight change.  HENT: Negative for dental problem, mouth sores and trouble swallowing.   Eyes: Negative for visual disturbance.  Respiratory: Negative for cough, choking, chest tightness, shortness of breath and wheezing.   Cardiovascular: Negative for chest pain, palpitations and leg swelling.  Gastrointestinal: Negative for nausea, vomiting, abdominal pain, diarrhea, constipation and abdominal distention.  Endocrine: Negative for polydipsia, polyphagia and polyuria.  Genitourinary: Negative for dysuria, urgency, hematuria  and flank pain.  Musculoskeletal: Negative for myalgias, back pain, gait problem and neck pain.  Skin: Negative for pallor, rash and wound.  Neurological: Negative for seizures,  syncope, weakness, numbness and headaches.  Psychiatric/Behavioral: Negative.  Negative for confusion and dysphoric mood.    Objective:    BP 134/74 mmHg  Pulse 72  Ht 5\' 6"  (1.676 m)  Wt 194 lb (87.998 kg)  BMI 31.33 kg/m2  Wt Readings from Last 3 Encounters:  12/28/15 194 lb (87.998 kg)  12/17/15 195 lb (88.451 kg)  09/12/15 189 lb (85.73 kg)    Physical Exam  Constitutional: He is oriented to person, place, and time. He appears well-developed and well-nourished. He is cooperative. No distress.  HENT:  Head: Normocephalic and atraumatic.  Eyes: EOM are normal.  Neck: Normal range of motion. Neck supple. No tracheal deviation present. No thyromegaly present.  Cardiovascular: Normal rate, S1 normal, S2 normal and normal heart sounds.  Exam reveals no gallop.   No murmur heard. Pulses:      Dorsalis pedis pulses are 1+ on the right side, and 1+ on the left side.       Posterior tibial pulses are 1+ on the right side, and 1+ on the left side.  Pulmonary/Chest: Breath sounds normal. No respiratory distress. He has no wheezes.  Abdominal: Soft. Bowel sounds are normal. He exhibits no distension. There is no tenderness. There is no guarding and no CVA tenderness.  Musculoskeletal: He exhibits no edema.       Right shoulder: He exhibits no swelling and no deformity.  Neurological: He is alert and oriented to person, place, and time. He has normal strength and normal reflexes. No cranial nerve deficit or sensory deficit. Gait normal.  Skin: Skin is warm and dry. No rash noted. No cyanosis. Nails show no clubbing.  Psychiatric: He has a normal mood and affect. His speech is normal and behavior is normal. Judgment and thought content normal. Cognition and memory are normal.    Assessment & Plan:   1. Uncontrolled type 2 diabetes mellitus without complication, without long-term current use of insulin (HCC)   Pt remains at a high risk for more acute and chronic complications of diabetes  which include CAD, CVA, CKD, retinopathy, and neuropathy. These are all discussed in detail with the patient.  - His recent A1c has been above target at 8.5%, increasing from 7.3%.  -However, over the last 7 days he monitored 24 times average is better at 185.   Recent labs reviewed.   - I have re-counseled the patient on diet management and weight loss  by adopting a carbohydrate restricted / protein rich  Diet.  - Suggestion is made for patient to avoid simple carbohydrates   from their diet including Cakes , Desserts, Ice Cream,  Soda (  diet and regular) , Sweet Tea , Candies,  Chips, Cookies, Artificial Sweeteners,   and "Sugar-free" Products .  This will help patient to have stable blood glucose profile and potentially avoid unintended  Weight gain.  - Patient is advised to stick to a routine mealtimes to eat 3 meals  a day and avoid unnecessary snacks ( to snack only to correct hypoglycemia).  - The patient  has been  scheduled with Jearld Fenton, RDN, CDE for individualized DM education.  - I have approached patient with the following individualized plan to manage diabetes and patient agrees.  -I will continue MTF ER 1000mg  po qday, increase  Invokana  To 200  mg po qam, he gets samples of this medication, I advised him to take 2 pills instead of 1. - he will continue  monitoring blood glucose beforbreakfast.  -If his glycemic burden is significantly above target, he will be considered for brief period of therapy with insulin.  - Patient specific target  for A1c; LDL, HDL, Triglycerides, and  Waist Circumference were discussed in detail.  2) BP/HTN: Controlled. Continue current medications including ACEI/ARB. 3) Lipids/HPL:  continue statins. 4)  Weight/Diet: CDE consult in progress, exercise, and carbohydrates information provided.  5) Chronic Care/Health Maintenance:  -Patient is on ACEI/ARB and Statin medications and encouraged to continue to follow up with Ophthalmology,  Podiatrist at least yearly or according to recommendations, and advised to  stay away from smoking. I have recommended yearly flu vaccine and pneumonia vaccination at least every 5 years; moderate intensity exercise for up to 150 minutes weekly; and  sleep for at least 7 hours a day.  - 25 minutes of time was spent on the care of this patient , 50% of which was applied for counseling on diabetes complications and their preventions.  - I advised patient to maintain close follow up with Sherrie Mustache, MD for primary care needs.  Patient is asked to bring meter and  blood glucose logs during their next visit.   Follow up plan: -Return in about 3 months (around 03/29/2016) for follow up with pre-visit labs, meter, and logs.  Glade Lloyd, MD Phone: [phone]  Fax: [phone]   12/28/2015, 4:00 PM

## 2016-03-12 DIAGNOSIS — E1159 Type 2 diabetes mellitus with other circulatory complications: Secondary | ICD-10-CM | POA: Insufficient documentation

## 2016-03-12 DIAGNOSIS — I152 Hypertension secondary to endocrine disorders: Secondary | ICD-10-CM | POA: Insufficient documentation

## 2016-03-24 ENCOUNTER — Encounter: Payer: Self-pay | Admitting: "Endocrinology

## 2016-03-24 ENCOUNTER — Ambulatory Visit (INDEPENDENT_AMBULATORY_CARE_PROVIDER_SITE_OTHER): Payer: Medicare HMO | Admitting: "Endocrinology

## 2016-03-24 VITALS — BP 143/77 | HR 73 | Ht 66.0 in | Wt 193.0 lb

## 2016-03-24 DIAGNOSIS — E6609 Other obesity due to excess calories: Secondary | ICD-10-CM | POA: Diagnosis not present

## 2016-03-24 DIAGNOSIS — Z6832 Body mass index (BMI) 32.0-32.9, adult: Secondary | ICD-10-CM | POA: Diagnosis not present

## 2016-03-24 DIAGNOSIS — I10 Essential (primary) hypertension: Secondary | ICD-10-CM | POA: Diagnosis not present

## 2016-03-24 DIAGNOSIS — IMO0001 Reserved for inherently not codable concepts without codable children: Secondary | ICD-10-CM

## 2016-03-24 DIAGNOSIS — E1165 Type 2 diabetes mellitus with hyperglycemia: Secondary | ICD-10-CM

## 2016-03-24 DIAGNOSIS — E7800 Pure hypercholesterolemia, unspecified: Secondary | ICD-10-CM | POA: Diagnosis not present

## 2016-03-24 MED ORDER — INSULIN PEN NEEDLE 31G X 8 MM MISC: 1.0000 | 3 refills | Status: DC

## 2016-03-24 MED ORDER — INSULIN GLARGINE 100 UNIT/ML SOLOSTAR PEN: 20.0000 [IU] | PEN_INJECTOR | Freq: Every day | SUBCUTANEOUS | 2 refills | Status: DC

## 2016-03-24 NOTE — Progress Notes (Signed)
Subjective:    Patient ID: Patrick Moss, male    DOB: [birth year], PCP Sherrie Mustache, MD   Past Medical History:  Diagnosis Date  . Diabetes mellitus without complication ([place])   . Hyperlipidemia   . Hypertension    Past Surgical History:  Procedure Laterality Date  . R foot fx     Social History   Social History  . Marital status: Divorced    Spouse name: N/A  . Number of children: N/A  . Years of education: N/A   Social History Main Topics  . Smoking status: Never Smoker  . Smokeless tobacco: Never Used  . Alcohol use None  . Drug use: Unknown  . Sexual activity: Not Asked   Other Topics Concern  . None   Social History Narrative  . None   Outpatient Encounter Prescriptions as of 03/24/2016  Medication Sig  . amLODipine (NORVASC) 5 MG tablet Take 5 mg by mouth daily.  Marland Kitchen aspirin 81 MG tablet Take 81 mg by mouth daily.  . Insulin Glargine (LANTUS SOLOSTAR) 100 UNIT/ML Solostar Pen Inject 20 Units into the skin daily at 10 pm.  . Insulin Pen Needle (B-D ULTRAFINE III SHORT PEN) 31G X 8 MM MISC 1 each by Does not apply route as directed.  Marland Kitchen losartan-hydrochlorothiazide (HYZAAR) 100-25 MG per tablet Take 1 tablet by mouth daily.  . metFORMIN (GLUCOPHAGE-XR) 500 MG 24 hr tablet Take 1,000 mg by mouth daily after breakfast.  . Omega-3 Fatty Acids (FISH OIL) 1000 MG CAPS Take 1,000 mg by mouth 2 (two) times daily.  . rosuvastatin (CRESTOR) 5 MG tablet Take 5 mg by mouth daily.  . [DISCONTINUED] canagliflozin (INVOKANA) 100 MG TABS tablet Take 1 tablet (100 mg total) by mouth daily before breakfast.   No facility-administered encounter medications on file as of 03/24/2016.    ALLERGIES: No Known Allergies VACCINATION STATUS:  There is no immunization history on file for this patient.  Diabetes  He presents for his follow-up diabetic visit. He has type 2 diabetes mellitus. Onset time: He was diagnosed at approximate age of 23 years. His disease course has  been worsening. There are no hypoglycemic associated symptoms. Pertinent negatives for hypoglycemia include no confusion, headaches, pallor or seizures. There are no diabetic associated symptoms. Pertinent negatives for diabetes include no chest pain, no fatigue, no polydipsia, no polyphagia, no polyuria and no weakness. There are no hypoglycemic complications. Symptoms are worsening. There are no diabetic complications. Risk factors for coronary artery disease include diabetes mellitus, dyslipidemia, male sex, obesity and sedentary lifestyle. He is compliant with treatment most of the time. His weight is stable. He is following a generally unhealthy diet. He participates in exercise intermittently. His home blood glucose trend is increasing steadily. His breakfast blood glucose range is generally 180-200 mg/dl. His overall blood glucose range is 180-200 mg/dl. An ACE inhibitor/angiotensin II receptor blocker is being taken.  Hyperlipidemia  This is a chronic problem. The current episode started more than 1 year ago. Pertinent negatives include no chest pain, myalgias or shortness of breath. Current antihyperlipidemic treatment includes statins. Risk factors for coronary artery disease include dyslipidemia, hypertension, male sex, obesity and a sedentary lifestyle.  Hypertension  Pertinent negatives include no chest pain, headaches, neck pain, palpitations or shortness of breath.     Review of Systems  Constitutional: Negative for fatigue and unexpected weight change.  HENT: Negative for dental problem, mouth sores and trouble swallowing.   Eyes: Negative for visual disturbance.  Respiratory: Negative for cough, choking, chest tightness, shortness of breath and wheezing.   Cardiovascular: Negative for chest pain, palpitations and leg swelling.  Gastrointestinal: Negative for abdominal distention, abdominal pain, constipation, diarrhea, nausea and vomiting.  Endocrine: Negative for polydipsia,  polyphagia and polyuria.  Genitourinary: Negative for dysuria, flank pain, hematuria and urgency.  Musculoskeletal: Negative for back pain, gait problem, myalgias and neck pain.  Skin: Negative for pallor, rash and wound.  Neurological: Negative for seizures, syncope, weakness, numbness and headaches.  Psychiatric/Behavioral: Negative.  Negative for confusion and dysphoric mood.    Objective:    BP (!) 143/77   Pulse 73   Ht 5\' 6"  (1.676 m)   Wt 193 lb (87.5 kg)   BMI 31.15 kg/m   Wt Readings from Last 3 Encounters:  03/24/16 193 lb (87.5 kg)  12/28/15 194 lb (88 kg)  12/17/15 195 lb (88.5 kg)    Physical Exam  Constitutional: He is oriented to person, place, and time. He appears well-developed and well-nourished. He is cooperative. No distress.  HENT:  Head: Normocephalic and atraumatic.  Eyes: EOM are normal.  Neck: Normal range of motion. Neck supple. No tracheal deviation present. No thyromegaly present.  Cardiovascular: Normal rate, S1 normal, S2 normal and normal heart sounds.  Exam reveals no gallop.   No murmur heard. Pulses:      Dorsalis pedis pulses are 1+ on the right side, and 1+ on the left side.       Posterior tibial pulses are 1+ on the right side, and 1+ on the left side.  Pulmonary/Chest: Breath sounds normal. No respiratory distress. He has no wheezes.  Abdominal: Soft. Bowel sounds are normal. He exhibits no distension. There is no tenderness. There is no guarding and no CVA tenderness.  Musculoskeletal: He exhibits no edema.       Right shoulder: He exhibits no swelling and no deformity.  Neurological: He is alert and oriented to person, place, and time. He has normal strength and normal reflexes. No cranial nerve deficit or sensory deficit. Gait normal.  Skin: Skin is warm and dry. No rash noted. No cyanosis. Nails show no clubbing.  Psychiatric: He has a normal mood and affect. His speech is normal and behavior is normal. Judgment and thought content  normal. Cognition and memory are normal.    Assessment & Plan:   1. Uncontrolled type 2 diabetes mellitus without complication, without long-term current use of insulin (HCC)   Pt remains at a high risk for more acute and chronic complications of diabetes which include CAD, CVA, CKD, retinopathy, and neuropathy. These are all discussed in detail with the patient.  - His recent A1c has been above target at 8.8%, increasing from 7.3%.  -over the last 30 days he monitored 27 times average is better at 200   Recent labs reviewed.   - I have re-counseled the patient on diet management and weight loss  by adopting a carbohydrate restricted / protein rich  Diet.  - Suggestion is made for patient to avoid simple carbohydrates   from their diet including Cakes , Desserts, Ice Cream,  Soda (  diet and regular) , Sweet Tea , Candies,  Chips, Cookies, Artificial Sweeteners,   and "Sugar-free" Products .  This will help patient to have stable blood glucose profile and potentially avoid unintended  Weight gain.  - Patient is advised to stick to a routine mealtimes to eat 3 meals  a day and avoid unnecessary snacks ( to  snack only to correct hypoglycemia).  - The patient  has been  scheduled with Jearld Fenton, RDN, CDE for individualized DM education.  - I have approached patient with the following individualized plan to manage diabetes and patient agrees.  - His renal function is abnormal. He is losing control on diabetes with A1c of 8.8%. He will need at least basal insulin to treat his diabetes. He agrees with plan.  I will continue MTF ER 1000mg  po qday, d/c Invokana for now . - I have demonstrated insulin use for him in the exam room. I will  initiate Lantus 20 units daily at bedtime associated with monitoring of blood glucose before Breakfast and  at bedtime.  - Patient specific target  for A1c; LDL, HDL, Triglycerides, and  Waist Circumference were discussed in detail.  2) BP/HTN: Controlled.  Continue current medications including ACEI/ARB. 3) Lipids/HPL:  continue statins. 4)  Weight/Diet: CDE consult in progress, exercise, and carbohydrates information provided.  5) Chronic Care/Health Maintenance:  -Patient is on ACEI/ARB and Statin medications and encouraged to continue to follow up with Ophthalmology, Podiatrist at least yearly or according to recommendations, and advised to  stay away from smoking. I have recommended yearly flu vaccine and pneumonia vaccination at least every 5 years; moderate intensity exercise for up to 150 minutes weekly; and  sleep for at least 7 hours a day.  - 25 minutes of time was spent on the care of this patient , 50% of which was applied for counseling on diabetes complications and their preventions.  - I advised patient to maintain close follow up with Sherrie Mustache, MD for primary care needs.  Patient is asked to bring meter and  blood glucose logs during their next visit.   Follow up plan: -Return in about 1 week (around 03/31/2016) for follow up with meter and logs- no labs.  Glade Lloyd, MD Phone: [phone]  Fax: [phone]   03/24/2016, 10:21 AM

## 2016-03-24 NOTE — Patient Instructions (Signed)

## 2016-04-01 ENCOUNTER — Ambulatory Visit (INDEPENDENT_AMBULATORY_CARE_PROVIDER_SITE_OTHER): Payer: Medicare HMO | Admitting: "Endocrinology

## 2016-04-01 ENCOUNTER — Encounter: Payer: Self-pay | Admitting: "Endocrinology

## 2016-04-01 VITALS — BP 145/73 | HR 66 | Ht 66.0 in | Wt 195.0 lb

## 2016-04-01 DIAGNOSIS — IMO0001 Reserved for inherently not codable concepts without codable children: Secondary | ICD-10-CM

## 2016-04-01 DIAGNOSIS — I10 Essential (primary) hypertension: Secondary | ICD-10-CM

## 2016-04-01 DIAGNOSIS — E7800 Pure hypercholesterolemia, unspecified: Secondary | ICD-10-CM | POA: Diagnosis not present

## 2016-04-01 DIAGNOSIS — E1165 Type 2 diabetes mellitus with hyperglycemia: Secondary | ICD-10-CM

## 2016-04-01 MED ORDER — INSULIN GLARGINE 100 UNIT/ML SOLOSTAR PEN: 24.0000 [IU] | PEN_INJECTOR | Freq: Every day | SUBCUTANEOUS | 2 refills | Status: DC

## 2016-04-01 NOTE — Progress Notes (Signed)
Subjective:    Patient ID: Patrick Moss, male    DOB: [birth year], PCP Sherrie Mustache, MD   Past Medical History:  Diagnosis Date  . Diabetes mellitus without complication ([place])   . Hyperlipidemia   . Hypertension    Past Surgical History:  Procedure Laterality Date  . R foot fx     Social History   Social History  . Marital status: Divorced    Spouse name: N/A  . Number of children: N/A  . Years of education: N/A   Social History Main Topics  . Smoking status: Never Smoker  . Smokeless tobacco: Never Used  . Alcohol use None  . Drug use: Unknown  . Sexual activity: Not Asked   Other Topics Concern  . None   Social History Narrative  . None   Outpatient Encounter Prescriptions as of 04/01/2016  Medication Sig  . amLODipine (NORVASC) 5 MG tablet Take 5 mg by mouth daily.  Marland Kitchen aspirin 81 MG tablet Take 81 mg by mouth daily.  . Insulin Glargine (LANTUS SOLOSTAR) 100 UNIT/ML Solostar Pen Inject 24 Units into the skin daily at 10 pm.  . Insulin Pen Needle (B-D ULTRAFINE III SHORT PEN) 31G X 8 MM MISC 1 each by Does not apply route as directed.  Marland Kitchen losartan-hydrochlorothiazide (HYZAAR) 100-25 MG per tablet Take 1 tablet by mouth daily.  . metFORMIN (GLUCOPHAGE-XR) 500 MG 24 hr tablet Take 1,000 mg by mouth daily after breakfast.  . Omega-3 Fatty Acids (FISH OIL) 1000 MG CAPS Take 1,000 mg by mouth 2 (two) times daily.  . rosuvastatin (CRESTOR) 5 MG tablet Take 5 mg by mouth daily.  . [DISCONTINUED] Insulin Glargine (LANTUS SOLOSTAR) 100 UNIT/ML Solostar Pen Inject 20 Units into the skin daily at 10 pm.   No facility-administered encounter medications on file as of 04/01/2016.    ALLERGIES: No Known Allergies VACCINATION STATUS:  There is no immunization history on file for this patient.  Diabetes  He presents for his follow-up diabetic visit. He has type 2 diabetes mellitus. Onset time: He was diagnosed at approximate age of 45 years. His disease course  has been improving. There are no hypoglycemic associated symptoms. Pertinent negatives for hypoglycemia include no confusion, headaches, pallor or seizures. There are no diabetic associated symptoms. Pertinent negatives for diabetes include no chest pain, no fatigue, no polydipsia, no polyphagia, no polyuria and no weakness. There are no hypoglycemic complications. Symptoms are improving. There are no diabetic complications. Risk factors for coronary artery disease include diabetes mellitus, dyslipidemia, male sex, obesity and sedentary lifestyle. He is compliant with treatment most of the time. His weight is stable. He is following a generally unhealthy diet. He participates in exercise intermittently. His home blood glucose trend is increasing steadily. His breakfast blood glucose range is generally 140-180 mg/dl. His lunch blood glucose range is generally 140-180 mg/dl. His dinner blood glucose range is generally 140-180 mg/dl. His overall blood glucose range is 140-180 mg/dl. An ACE inhibitor/angiotensin II receptor blocker is being taken.  Hyperlipidemia  This is a chronic problem. The current episode started more than 1 year ago. Pertinent negatives include no chest pain, myalgias or shortness of breath. Current antihyperlipidemic treatment includes statins. Risk factors for coronary artery disease include dyslipidemia, hypertension, male sex, obesity and a sedentary lifestyle.  Hypertension  Pertinent negatives include no chest pain, headaches, neck pain, palpitations or shortness of breath.     Review of Systems  Constitutional: Negative for fatigue and unexpected weight  change.  HENT: Negative for dental problem, mouth sores and trouble swallowing.   Eyes: Negative for visual disturbance.  Respiratory: Negative for cough, choking, chest tightness, shortness of breath and wheezing.   Cardiovascular: Negative for chest pain, palpitations and leg swelling.  Gastrointestinal: Negative for  abdominal distention, abdominal pain, constipation, diarrhea, nausea and vomiting.  Endocrine: Negative for polydipsia, polyphagia and polyuria.  Genitourinary: Negative for dysuria, flank pain, hematuria and urgency.  Musculoskeletal: Negative for back pain, gait problem, myalgias and neck pain.  Skin: Negative for pallor, rash and wound.  Neurological: Negative for seizures, syncope, weakness, numbness and headaches.  Psychiatric/Behavioral: Negative.  Negative for confusion and dysphoric mood.    Objective:    BP (!) 145/73   Pulse 66   Ht 5\' 6"  (1.676 m)   Wt 195 lb (88.5 kg)   BMI 31.47 kg/m   Wt Readings from Last 3 Encounters:  04/01/16 195 lb (88.5 kg)  03/24/16 193 lb (87.5 kg)  12/28/15 194 lb (88 kg)    Physical Exam  Constitutional: He is oriented to person, place, and time. He appears well-developed and well-nourished. He is cooperative. No distress.  HENT:  Head: Normocephalic and atraumatic.  Eyes: EOM are normal.  Neck: Normal range of motion. Neck supple. No tracheal deviation present. No thyromegaly present.  Cardiovascular: Normal rate, S1 normal, S2 normal and normal heart sounds.  Exam reveals no gallop.   No murmur heard. Pulses:      Dorsalis pedis pulses are 1+ on the right side, and 1+ on the left side.       Posterior tibial pulses are 1+ on the right side, and 1+ on the left side.  Pulmonary/Chest: Breath sounds normal. No respiratory distress. He has no wheezes.  Abdominal: Soft. Bowel sounds are normal. He exhibits no distension. There is no tenderness. There is no guarding and no CVA tenderness.  Musculoskeletal: He exhibits no edema.       Right shoulder: He exhibits no swelling and no deformity.  Neurological: He is alert and oriented to person, place, and time. He has normal strength and normal reflexes. No cranial nerve deficit or sensory deficit. Gait normal.  Skin: Skin is warm and dry. No rash noted. No cyanosis. Nails show no clubbing.   Psychiatric: He has a normal mood and affect. His speech is normal and behavior is normal. Judgment and thought content normal. Cognition and memory are normal.    Assessment & Plan:   1. Uncontrolled type 2 diabetes mellitus without complication, without long-term current use of insulin ([facility])   Pt remains at a high risk for more acute and chronic complications of diabetes which include CAD, CVA, CKD, retinopathy, and neuropathy. These are all discussed in detail with the patient.  - His recent A1c has been above target at 8.8%, increasing from 7.3%.  -over the last 30 days he monitored 27 times average is better at 200   Recent labs reviewed.   - I have re-counseled the patient on diet management and weight loss  by adopting a carbohydrate restricted / protein rich  Diet.  - Suggestion is made for patient to avoid simple carbohydrates   from their diet including Cakes , Desserts, Ice Cream,  Soda (  diet and regular) , Sweet Tea , Candies,  Chips, Cookies, Artificial Sweeteners,   and "Sugar-free" Products .  This will help patient to have stable blood glucose profile and potentially avoid unintended  Weight gain.  - Patient is  advised to stick to a routine mealtimes to eat 3 meals  a day and avoid unnecessary snacks ( to snack only to correct hypoglycemia).  - The patient  has been  scheduled with Jearld Fenton, RDN, CDE for individualized DM education.  - I have approached patient with the following individualized plan to manage diabetes and patient agrees.  -  He is losing control on diabetes with A1c of 8.8%. He will  Continue to need at least basal insulin to treat his diabetes. He agrees with plan.  -I will  increase Lantus to 24 units daily at bedtime associated with monitoring of blood glucose before Breakfast and  at bedtime. I will continue MTF ER 1000mg  po qday, d/c Invokana for now . - Patient specific target  for A1c; LDL, HDL, Triglycerides, and  Waist Circumference were  discussed in detail.  2) BP/HTN: Controlled. Continue current medications including ACEI/ARB. 3) Lipids/HPL:  continue statins. 4)  Weight/Diet: CDE consult in progress, exercise, and carbohydrates information provided.  5) Chronic Care/Health Maintenance:  -Patient is on ACEI/ARB and Statin medications and encouraged to continue to follow up with Ophthalmology, Podiatrist at least yearly or according to recommendations, and advised to  stay away from smoking. I have recommended yearly flu vaccine and pneumonia vaccination at least every 5 years; moderate intensity exercise for up to 150 minutes weekly; and  sleep for at least 7 hours a day.  - 25 minutes of time was spent on the care of this patient , 50% of which was applied for counseling on diabetes complications and their preventions.  - I advised patient to maintain close follow up with Sherrie Mustache, MD for primary care needs.  Patient is asked to bring meter and  blood glucose logs during their next visit.   Follow up plan: -Return in about 3 months (around 07/02/2016) for follow up with pre-visit labs, meter, and logs.  Glade Lloyd, MD Phone: [phone]  Fax: [phone]   04/01/2016, 9:38 AM

## 2016-04-01 NOTE — Patient Instructions (Signed)

## 2016-07-03 ENCOUNTER — Ambulatory Visit: Payer: Medicare HMO | Admitting: "Endocrinology

## 2016-07-14 ENCOUNTER — Encounter: Payer: Self-pay | Admitting: "Endocrinology

## 2016-07-14 ENCOUNTER — Ambulatory Visit (INDEPENDENT_AMBULATORY_CARE_PROVIDER_SITE_OTHER): Payer: Medicare HMO | Admitting: "Endocrinology

## 2016-07-14 VITALS — BP 136/81 | HR 70 | Ht 66.0 in | Wt 202.0 lb

## 2016-07-14 DIAGNOSIS — I10 Essential (primary) hypertension: Secondary | ICD-10-CM

## 2016-07-14 DIAGNOSIS — E1165 Type 2 diabetes mellitus with hyperglycemia: Secondary | ICD-10-CM

## 2016-07-14 DIAGNOSIS — IMO0001 Reserved for inherently not codable concepts without codable children: Secondary | ICD-10-CM

## 2016-07-14 DIAGNOSIS — E7800 Pure hypercholesterolemia, unspecified: Secondary | ICD-10-CM | POA: Diagnosis not present

## 2016-07-14 MED ORDER — INSULIN GLARGINE 100 UNIT/ML SOLOSTAR PEN: 30.0000 [IU] | PEN_INJECTOR | Freq: Every day | SUBCUTANEOUS | 2 refills | Status: DC

## 2016-07-14 NOTE — Progress Notes (Addendum)
Patient has difficulty affording insulin.   When he was here last , he was given a sample of Basaglar 30 units daily at bedtime to use in place of Lantus when he was in clinic. - Basaglar samples can be given to him if Lantus is not available, until his insurance starts to cover Lantus properly.   Subjective:    Patient ID: Patrick Moss, male    DOB: [birth year], PCP Sherrie Mustache, MD   Past Medical History:  Diagnosis Date  . Diabetes mellitus without complication ([place])   . Hyperlipidemia   . Hypertension    Past Surgical History:  Procedure Laterality Date  . R foot fx     Social History   Social History  . Marital status: Divorced    Spouse name: N/A  . Number of children: N/A  . Years of education: N/A   Social History Main Topics  . Smoking status: Never Smoker  . Smokeless tobacco: Never Used  . Alcohol use None  . Drug use: Unknown  . Sexual activity: Not Asked   Other Topics Concern  . None   Social History Narrative  . None   Outpatient Encounter Prescriptions as of 07/14/2016  Medication Sig  . amLODipine (NORVASC) 5 MG tablet Take 5 mg by mouth daily.  Marland Kitchen aspirin 81 MG tablet Take 81 mg by mouth daily.  . Insulin Glargine (LANTUS SOLOSTAR) 100 UNIT/ML Solostar Pen Inject 24 Units into the skin daily at 10 pm.  . Insulin Pen Needle (B-D ULTRAFINE III SHORT PEN) 31G X 8 MM MISC 1 each by Does not apply route as directed.  Marland Kitchen losartan-hydrochlorothiazide (HYZAAR) 100-25 MG per tablet Take 1 tablet by mouth daily.  . metFORMIN (GLUCOPHAGE-XR) 500 MG 24 hr tablet Take 1,000 mg by mouth daily after breakfast.  . Omega-3 Fatty Acids (FISH OIL) 1000 MG CAPS Take 1,000 mg by mouth 2 (two) times daily.  . rosuvastatin (CRESTOR) 5 MG tablet Take 5 mg by mouth daily.   No facility-administered encounter medications on file as of 07/14/2016.    ALLERGIES: No Known Allergies VACCINATION STATUS:  There is no immunization history on file for this  patient.  Diabetes  He presents for his follow-up diabetic visit. He has type 2 diabetes mellitus. Onset time: He was diagnosed at approximate age of 67 years. His disease course has been worsening. There are no hypoglycemic associated symptoms. Pertinent negatives for hypoglycemia include no confusion, headaches, pallor or seizures. There are no diabetic associated symptoms. Pertinent negatives for diabetes include no chest pain, no fatigue, no polydipsia, no polyphagia, no polyuria and no weakness. There are no hypoglycemic complications. Symptoms are worsening. There are no diabetic complications. Risk factors for coronary artery disease include diabetes mellitus, dyslipidemia, male sex, obesity and sedentary lifestyle. He is compliant with treatment most of the time. His weight is increasing steadily. He is following a generally unhealthy diet. He participates in exercise intermittently. His home blood glucose trend is increasing steadily. His breakfast blood glucose range is generally >200 mg/dl. His dinner blood glucose range is generally >200 mg/dl. His overall blood glucose range is >200 mg/dl. An ACE inhibitor/angiotensin II receptor blocker is being taken.  Hyperlipidemia  This is a chronic problem. The current episode started more than 1 year ago. Pertinent negatives include no chest pain, myalgias or shortness of breath. Current antihyperlipidemic treatment includes statins. Risk factors for coronary artery disease include dyslipidemia, hypertension, male sex, obesity and a sedentary lifestyle.  Hypertension  Pertinent  negatives include no chest pain, headaches, neck pain, palpitations or shortness of breath.     Review of Systems  Constitutional: Negative for fatigue and unexpected weight change.  HENT: Negative for dental problem, mouth sores and trouble swallowing.   Eyes: Negative for visual disturbance.  Respiratory: Negative for cough, choking, chest tightness, shortness of breath  and wheezing.   Cardiovascular: Negative for chest pain, palpitations and leg swelling.  Gastrointestinal: Negative for abdominal distention, abdominal pain, constipation, diarrhea, nausea and vomiting.  Endocrine: Negative for polydipsia, polyphagia and polyuria.  Genitourinary: Negative for dysuria, flank pain, hematuria and urgency.  Musculoskeletal: Negative for back pain, gait problem, myalgias and neck pain.  Skin: Negative for pallor, rash and wound.  Neurological: Negative for seizures, syncope, weakness, numbness and headaches.  Psychiatric/Behavioral: Negative.  Negative for confusion and dysphoric mood.    Objective:    BP 136/81   Pulse 70   Ht 5\' 6"  (1.676 m)   Wt 202 lb (91.6 kg)   BMI 32.60 kg/m   Wt Readings from Last 3 Encounters:  07/14/16 202 lb (91.6 kg)  04/01/16 195 lb (88.5 kg)  03/24/16 193 lb (87.5 kg)    Physical Exam  Constitutional: He is oriented to person, place, and time. He appears well-developed and well-nourished. He is cooperative. No distress.  HENT:  Head: Normocephalic and atraumatic.  Eyes: EOM are normal.  Neck: Normal range of motion. Neck supple. No tracheal deviation present. No thyromegaly present.  Cardiovascular: Normal rate, S1 normal, S2 normal and normal heart sounds.  Exam reveals no gallop.   No murmur heard. Pulses:      Dorsalis pedis pulses are 1+ on the right side, and 1+ on the left side.       Posterior tibial pulses are 1+ on the right side, and 1+ on the left side.  Pulmonary/Chest: Breath sounds normal. No respiratory distress. He has no wheezes.  Abdominal: Soft. Bowel sounds are normal. He exhibits no distension. There is no tenderness. There is no guarding and no CVA tenderness.  Musculoskeletal: He exhibits no edema.       Right shoulder: He exhibits no swelling and no deformity.  Neurological: He is alert and oriented to person, place, and time. He has normal strength and normal reflexes. No cranial nerve deficit  or sensory deficit. Gait normal.  Skin: Skin is warm and dry. No rash noted. No cyanosis. Nails show no clubbing.  Psychiatric: He has a normal mood and affect. His speech is normal and behavior is normal. Judgment and thought content normal. Cognition and memory are normal.    Assessment & Plan:   1. Uncontrolled type 2 diabetes mellitus without complication, without long-term current use of insulin (HCC)   Pt remains at a high risk for more acute and chronic complications of diabetes which include CAD, CVA, CKD, retinopathy, and neuropathy. These are all discussed in detail with the patient.  - His recent A1c has been above target at 9.1%,  progressively increasing from 7.3%.  -over the last 30 days he monitored 27 times average is above 200   Recent labs reviewed.   - I have re-counseled the patient on diet management and weight loss  by adopting a carbohydrate restricted / protein rich  Diet.  - Suggestion is made for patient to avoid simple carbohydrates   from their diet including Cakes , Desserts, Ice Cream,  Soda (  diet and regular) , Sweet Tea , Candies,  Chips, Cookies, Artificial Sweeteners,  and "Sugar-free" Products .  This will help patient to have stable blood glucose profile and potentially avoid unintended  Weight gain.  - Patient is advised to stick to a routine mealtimes to eat 3 meals  a day and avoid unnecessary snacks ( to snack only to correct hypoglycemia).  - The patient  has been  scheduled with Jearld Fenton, RDN, CDE for individualized DM education.  - I have approached patient with the following individualized plan to manage diabetes and patient agrees.  - He will  Continue to need at least basal insulin to treat his diabetes. He agrees with plan.  -I will  increase Lantus to 30 units daily at bedtime associated with monitoring of blood glucose before Breakfast and  at bedtime. - He will be considered for basal/bolus insulin if he continues to lose  control. I will continue MTF ER 1000mg  po qday, d/c Invokana for now . - Patient specific target  for A1c; LDL, HDL, Triglycerides, and  Waist Circumference were discussed in detail.  2) BP/HTN: Controlled. Continue current medications including ACEI/ARB. 3) Lipids/HPL:  continue statins. 4)  Weight/Diet: Gained 7 pounds since last visit, CDE consult in progress, exercise, and carbohydrates information provided.  5) Chronic Care/Health Maintenance:  -Patient is on ACEI/ARB and Statin medications and encouraged to continue to follow up with Ophthalmology, Podiatrist at least yearly or according to recommendations, and advised to  stay away from smoking. I have recommended yearly flu vaccine and pneumonia vaccination at least every 5 years; moderate intensity exercise for up to 150 minutes weekly; and  sleep for at least 7 hours a day.  - 25 minutes of time was spent on the care of this patient , 50% of which was applied for counseling on diabetes complications and their preventions.  - I advised patient to maintain close follow up with Sherrie Mustache, MD for primary care needs.  Patient is asked to bring meter and  blood glucose logs during their next visit.   Follow up plan: -Return for follow up with pre-visit labs, meter, and logs.  Glade Lloyd, MD Phone: [phone]  Fax: [phone]   07/14/2016, 1:48 PM

## 2016-07-14 NOTE — Patient Instructions (Signed)

## 2016-10-01 LAB — LIPID PANEL
Cholesterol: 144 (ref 0–200)
HDL: 36 (ref 35–70)
LDL Cholesterol: 61
TRIGLYCERIDES: 235 — AB (ref 40–160)

## 2016-10-01 LAB — HEMOGLOBIN A1C: Hemoglobin A1C: 9.3

## 2016-10-14 ENCOUNTER — Ambulatory Visit (INDEPENDENT_AMBULATORY_CARE_PROVIDER_SITE_OTHER): Payer: Medicare HMO | Admitting: "Endocrinology

## 2016-10-14 ENCOUNTER — Encounter: Payer: Self-pay | Admitting: "Endocrinology

## 2016-10-14 VITALS — BP 145/70 | HR 73 | Ht 66.0 in | Wt 207.0 lb

## 2016-10-14 DIAGNOSIS — I10 Essential (primary) hypertension: Secondary | ICD-10-CM

## 2016-10-14 DIAGNOSIS — IMO0001 Reserved for inherently not codable concepts without codable children: Secondary | ICD-10-CM

## 2016-10-14 DIAGNOSIS — E1165 Type 2 diabetes mellitus with hyperglycemia: Secondary | ICD-10-CM | POA: Diagnosis not present

## 2016-10-14 DIAGNOSIS — E7800 Pure hypercholesterolemia, unspecified: Secondary | ICD-10-CM

## 2016-10-14 DIAGNOSIS — E6609 Other obesity due to excess calories: Secondary | ICD-10-CM | POA: Diagnosis not present

## 2016-10-14 DIAGNOSIS — Z6832 Body mass index (BMI) 32.0-32.9, adult: Secondary | ICD-10-CM

## 2016-10-14 NOTE — Progress Notes (Signed)
Subjective:    Patient ID: Patrick Moss, male    DOB: 05-12-1941, PCP Sherrie Mustache, MD   Past Medical History:  Diagnosis Date  . Diabetes mellitus without complication ([place])   . Hyperlipidemia   . Hypertension    Past Surgical History:  Procedure Laterality Date  . R foot fx     Social History   Social History  . Marital status: Divorced    Spouse name: N/A  . Number of children: N/A  . Years of education: N/A   Social History Main Topics  . Smoking status: Never Smoker  . Smokeless tobacco: Never Used  . Alcohol use None  . Drug use: Unknown  . Sexual activity: Not Asked   Other Topics Concern  . None   Social History Narrative  . None   Outpatient Encounter Prescriptions as of 10/14/2016  Medication Sig  . Insulin Glargine (LANTUS SOLOSTAR [place]) Inject 45 Units into the skin at bedtime.  Marland Kitchen amLODipine (NORVASC) 5 MG tablet Take 5 mg by mouth daily.  Marland Kitchen aspirin 81 MG tablet Take 81 mg by mouth daily.  . Insulin Pen Needle (B-D ULTRAFINE III SHORT PEN) 31G X 8 MM MISC 1 each by Does not apply route as directed.  Marland Kitchen losartan-hydrochlorothiazide (HYZAAR) 100-25 MG per tablet Take 1 tablet by mouth daily.  . metFORMIN (GLUCOPHAGE-XR) 500 MG 24 hr tablet Take 1,000 mg by mouth daily after breakfast.  . Omega-3 Fatty Acids (FISH OIL) 1000 MG CAPS Take 1,000 mg by mouth 2 (two) times daily.  . rosuvastatin (CRESTOR) 5 MG tablet Take 5 mg by mouth daily.  . [DISCONTINUED] Insulin Glargine (LANTUS SOLOSTAR) 100 UNIT/ML Solostar Pen Inject 30 Units into the skin daily at 10 pm.   No facility-administered encounter medications on file as of 10/14/2016.    ALLERGIES: No Known Allergies VACCINATION STATUS:  There is no immunization history on file for this patient.  Diabetes  He presents for his follow-up diabetic visit. He has type 2 diabetes mellitus. Onset time: He was diagnosed at approximate age of 67 years. His disease course has been worsening. There are no  hypoglycemic associated symptoms. Pertinent negatives for hypoglycemia include no confusion, headaches, pallor or seizures. There are no diabetic associated symptoms. Pertinent negatives for diabetes include no chest pain, no fatigue, no polydipsia, no polyphagia, no polyuria and no weakness. There are no hypoglycemic complications. Symptoms are worsening. There are no diabetic complications. Risk factors for coronary artery disease include diabetes mellitus, dyslipidemia, male sex, obesity and sedentary lifestyle. He is compliant with treatment most of the time. His weight is increasing steadily. He is following a generally unhealthy diet. He participates in exercise intermittently. His home blood glucose trend is increasing steadily. His breakfast blood glucose range is generally >200 mg/dl. His dinner blood glucose range is generally >200 mg/dl. His overall blood glucose range is >200 mg/dl. An ACE inhibitor/angiotensin II receptor blocker is being taken.  Hyperlipidemia  This is a chronic problem. The current episode started more than 1 year ago. Pertinent negatives include no chest pain, myalgias or shortness of breath. Current antihyperlipidemic treatment includes statins. Risk factors for coronary artery disease include dyslipidemia, hypertension, male sex, obesity and a sedentary lifestyle.  Hypertension  Pertinent negatives include no chest pain, headaches, neck pain, palpitations or shortness of breath.     Review of Systems  Constitutional: Negative for fatigue and unexpected weight change.  HENT: Negative for dental problem, mouth sores and trouble swallowing.   Eyes:  Negative for visual disturbance.  Respiratory: Negative for cough, choking, chest tightness, shortness of breath and wheezing.   Cardiovascular: Negative for chest pain, palpitations and leg swelling.  Gastrointestinal: Negative for abdominal distention, abdominal pain, constipation, diarrhea, nausea and vomiting.  Endocrine:  Negative for polydipsia, polyphagia and polyuria.  Genitourinary: Negative for dysuria, flank pain, hematuria and urgency.  Musculoskeletal: Negative for back pain, gait problem, myalgias and neck pain.  Skin: Negative for pallor, rash and wound.  Neurological: Negative for seizures, syncope, weakness, numbness and headaches.  Psychiatric/Behavioral: Negative.  Negative for confusion and dysphoric mood.    Objective:    BP (!) 145/70   Pulse 73   Ht 5\' 6"  (1.676 m)   Wt 207 lb (93.9 kg)   BMI 33.41 kg/m   Wt Readings from Last 3 Encounters:  10/14/16 207 lb (93.9 kg)  07/14/16 202 lb (91.6 kg)  04/01/16 195 lb (88.5 kg)    Physical Exam  Constitutional: He is oriented to person, place, and time. He appears well-developed and well-nourished. He is cooperative. No distress.  HENT:  Head: Normocephalic and atraumatic.  Eyes: EOM are normal.  Neck: Normal range of motion. Neck supple. No tracheal deviation present. No thyromegaly present.  Cardiovascular: Normal rate, S1 normal, S2 normal and normal heart sounds.  Exam reveals no gallop.   No murmur heard. Pulses:      Dorsalis pedis pulses are 1+ on the right side, and 1+ on the left side.       Posterior tibial pulses are 1+ on the right side, and 1+ on the left side.  Pulmonary/Chest: Breath sounds normal. No respiratory distress. He has no wheezes.  Abdominal: Soft. Bowel sounds are normal. He exhibits no distension. There is no tenderness. There is no guarding and no CVA tenderness.  Musculoskeletal: He exhibits no edema.       Right shoulder: He exhibits no swelling and no deformity.  Neurological: He is alert and oriented to person, place, and time. He has normal strength and normal reflexes. No cranial nerve deficit or sensory deficit. Gait normal.  Skin: Skin is warm and dry. No rash noted. No cyanosis. Nails show no clubbing.  Psychiatric: He has a normal mood and affect. His speech is normal and behavior is normal.  Judgment and thought content normal. Cognition and memory are normal.    Assessment & Plan:   1. Uncontrolled type 2 diabetes mellitus without complication, without long-term current use of insulin (HCC)   Pt remains at a high risk for more acute and chronic complications of diabetes which include CAD, CVA, CKD, retinopathy, and neuropathy. These are all discussed in detail with the patient.3 - His recent A1c has been above target at 9.1%,  progressively increasing from 7.3%.     Recent labs reviewed.   - I have re-counseled the patient on diet management and weight loss  by adopting a carbohydrate restricted / protein rich  Diet.  - Suggestion is made for patient to avoid simple carbohydrates   from their diet including Cakes , Desserts, Ice Cream,  Soda (  diet and regular) , Sweet Tea , Candies,  Chips, Cookies, Artificial Sweeteners,   and "Sugar-free" Products .  This will help patient to have stable blood glucose profile and potentially avoid unintended  Weight gain.  - Patient is advised to stick to a routine mealtimes to eat 3 meals  a day and avoid unnecessary snacks ( to snack only to correct hypoglycemia).  -  The patient  has been  scheduled with Jearld Fenton, RDN, CDE for individualized DM education.  - I have approached patient with the following individualized plan to manage diabetes and patient agrees.  - He is losing control of his diabetes, A1c higher at 9.3%. - He complains of cost of medications including insulin, he would be considered for premixed Novolin 70/30 after he finishes his current supply of Lantus.  - In the meantime, I advised him to increase Lantus to 45 units daily at bedtime  associated with monitoring of blood glucose 4 times a day-before meals and at bedtime and return in 1 week for reevaluation and initiation of Novolin 70/30. I will continue MTF ER 1000mg  po qday. - He did not tolerate Invokana ,  which was causing  frequent genital candidiasis and  was discontinued.   - Patient specific target  for A1c; LDL, HDL, Triglycerides, and  Waist Circumference were discussed in detail.  2) BP/HTN: Controlled. Continue current medications including ACEI/ARB. 3) Lipids/HPL:  continue statins. 4)  Weight/Diet: Gained 7 pounds since last visit, CDE consult in progress, exercise, and carbohydrates information provided.  5) Chronic Care/Health Maintenance:  -Patient is on ACEI/ARB and Statin medications and encouraged to continue to follow up with Ophthalmology, Podiatrist at least yearly or according to recommendations, and advised to  stay away from smoking. I have recommended yearly flu vaccine and pneumonia vaccination at least every 5 years; moderate intensity exercise for up to 150 minutes weekly; and  sleep for at least 7 hours a day.  - 25 minutes of time was spent on the care of this patient , 50% of which was applied for counseling on diabetes complications and their preventions.  - I advised patient to maintain close follow up with Sherrie Mustache, MD for primary care needs.  Patient is asked to bring meter and  blood glucose logs during their next visit.   Follow up plan: -Return in about 1 week (around 10/21/2016) for follow up with meter and logs- no labs.  Glade Lloyd, MD Phone: [phone]  Fax: [phone]   10/14/2016, 9:52 AM

## 2016-10-14 NOTE — Patient Instructions (Signed)

## 2016-10-20 ENCOUNTER — Ambulatory Visit (INDEPENDENT_AMBULATORY_CARE_PROVIDER_SITE_OTHER): Payer: Medicare HMO | Admitting: "Endocrinology

## 2016-10-20 ENCOUNTER — Encounter: Payer: Self-pay | Admitting: "Endocrinology

## 2016-10-20 VITALS — BP 134/75 | HR 66 | Ht 66.0 in | Wt 200.0 lb

## 2016-10-20 DIAGNOSIS — E7800 Pure hypercholesterolemia, unspecified: Secondary | ICD-10-CM | POA: Diagnosis not present

## 2016-10-20 DIAGNOSIS — E6609 Other obesity due to excess calories: Secondary | ICD-10-CM | POA: Diagnosis not present

## 2016-10-20 DIAGNOSIS — E1165 Type 2 diabetes mellitus with hyperglycemia: Secondary | ICD-10-CM | POA: Diagnosis not present

## 2016-10-20 DIAGNOSIS — I10 Essential (primary) hypertension: Secondary | ICD-10-CM | POA: Diagnosis not present

## 2016-10-20 DIAGNOSIS — Z6832 Body mass index (BMI) 32.0-32.9, adult: Secondary | ICD-10-CM

## 2016-10-20 DIAGNOSIS — IMO0001 Reserved for inherently not codable concepts without codable children: Secondary | ICD-10-CM

## 2016-10-20 NOTE — Progress Notes (Signed)
Subjective:    Patient ID: Patrick Moss, male    DOB: 03/31/1941, PCP Dione Housekeeper, MD   Past Medical History:  Diagnosis Date  . Diabetes mellitus without complication ([place])   . Hyperlipidemia   . Hypertension    Past Surgical History:  Procedure Laterality Date  . R foot fx     Social History   Social History  . Marital status: Divorced    Spouse name: N/A  . Number of children: N/A  . Years of education: N/A   Social History Main Topics  . Smoking status: Never Smoker  . Smokeless tobacco: Never Used  . Alcohol use None  . Drug use: Unknown  . Sexual activity: Not Asked   Other Topics Concern  . None   Social History Narrative  . None   Outpatient Encounter Prescriptions as of 10/20/2016  Medication Sig  . amLODipine (NORVASC) 5 MG tablet Take 5 mg by mouth daily.  Marland Kitchen aspirin 81 MG tablet Take 81 mg by mouth daily.  . Insulin Glargine (LANTUS SOLOSTAR [place]) Inject 45 Units into the skin at bedtime.  . Insulin Pen Needle (B-D ULTRAFINE III SHORT PEN) 31G X 8 MM MISC 1 each by Does not apply route as directed.  Marland Kitchen losartan-hydrochlorothiazide (HYZAAR) 100-25 MG per tablet Take 1 tablet by mouth daily.  . metFORMIN (GLUCOPHAGE-XR) 500 MG 24 hr tablet Take 1,000 mg by mouth daily after breakfast.  . Omega-3 Fatty Acids (FISH OIL) 1000 MG CAPS Take 1,000 mg by mouth 2 (two) times daily.  . rosuvastatin (CRESTOR) 5 MG tablet Take 5 mg by mouth daily.   No facility-administered encounter medications on file as of 10/20/2016.    ALLERGIES: No Known Allergies VACCINATION STATUS:  There is no immunization history on file for this patient.  Diabetes  He presents for his follow-up diabetic visit. He has type 2 diabetes mellitus. Onset time: He was diagnosed at approximate age of 20 years. His disease course has been improving. There are no hypoglycemic associated symptoms. Pertinent negatives for hypoglycemia include no confusion, headaches, pallor or seizures. There are  no diabetic associated symptoms. Pertinent negatives for diabetes include no chest pain, no fatigue, no polydipsia, no polyphagia, no polyuria and no weakness. There are no hypoglycemic complications. Symptoms are improving. There are no diabetic complications. Risk factors for coronary artery disease include diabetes mellitus, dyslipidemia, male sex, obesity and sedentary lifestyle. He is compliant with treatment most of the time. His weight is decreasing steadily. He is following a generally unhealthy diet. He participates in exercise intermittently. His home blood glucose trend is increasing steadily. His breakfast blood glucose range is generally 140-180 mg/dl. His lunch blood glucose range is generally 140-180 mg/dl. His dinner blood glucose range is generally 140-180 mg/dl. His overall blood glucose range is 140-180 mg/dl. An ACE inhibitor/angiotensin II receptor blocker is being taken.  Hyperlipidemia  This is a chronic problem. The current episode started more than 1 year ago. Pertinent negatives include no chest pain, myalgias or shortness of breath. Current antihyperlipidemic treatment includes statins. Risk factors for coronary artery disease include dyslipidemia, hypertension, male sex, obesity and a sedentary lifestyle.  Hypertension  Pertinent negatives include no chest pain, headaches, neck pain, palpitations or shortness of breath.     Review of Systems  Constitutional: Negative for fatigue and unexpected weight change.  HENT: Negative for dental problem, mouth sores and trouble swallowing.   Eyes: Negative for visual disturbance.  Respiratory: Negative for cough, choking, chest tightness,  shortness of breath and wheezing.   Cardiovascular: Negative for chest pain, palpitations and leg swelling.  Gastrointestinal: Negative for abdominal distention, abdominal pain, constipation, diarrhea, nausea and vomiting.  Endocrine: Negative for polydipsia, polyphagia and polyuria.  Genitourinary:  Negative for dysuria, flank pain, hematuria and urgency.  Musculoskeletal: Negative for back pain, gait problem, myalgias and neck pain.  Skin: Negative for pallor, rash and wound.  Neurological: Negative for seizures, syncope, weakness, numbness and headaches.  Psychiatric/Behavioral: Negative.  Negative for confusion and dysphoric mood.    Objective:    BP 134/75   Pulse 66   Ht 5\' 6"  (1.676 m)   Wt 200 lb (90.7 kg)   BMI 32.28 kg/m   Wt Readings from Last 3 Encounters:  10/20/16 200 lb (90.7 kg)  10/14/16 207 lb (93.9 kg)  07/14/16 202 lb (91.6 kg)    Physical Exam  Constitutional: He is oriented to person, place, and time. He appears well-developed and well-nourished. He is cooperative. No distress.  HENT:  Head: Normocephalic and atraumatic.  Eyes: EOM are normal.  Neck: Normal range of motion. Neck supple. No tracheal deviation present. No thyromegaly present.  Cardiovascular: Normal rate, S1 normal, S2 normal and normal heart sounds.  Exam reveals no gallop.   No murmur heard. Pulses:      Dorsalis pedis pulses are 1+ on the right side, and 1+ on the left side.       Posterior tibial pulses are 1+ on the right side, and 1+ on the left side.  Pulmonary/Chest: Breath sounds normal. No respiratory distress. He has no wheezes.  Abdominal: Soft. Bowel sounds are normal. He exhibits no distension. There is no tenderness. There is no guarding and no CVA tenderness.  Musculoskeletal: He exhibits no edema.       Right shoulder: He exhibits no swelling and no deformity.  Neurological: He is alert and oriented to person, place, and time. He has normal strength and normal reflexes. No cranial nerve deficit or sensory deficit. Gait normal.  Skin: Skin is warm and dry. No rash noted. No cyanosis. Nails show no clubbing.  Psychiatric: He has a normal mood and affect. His speech is normal and behavior is normal. Judgment and thought content normal. Cognition and memory are normal.     Assessment & Plan:   1. Uncontrolled type 2 diabetes mellitus without complication, without long-term current use of insulin (HCC)   Pt remains at a high risk for more acute and chronic complications of diabetes which include CAD, CVA, CKD, retinopathy, and neuropathy. These are all discussed in detail with the patient.3 -  He came with controlled glycemic profile after his basal insulin was recently adjusted.  - His recent A1c has been  9.3%,  progressively increasing from 7.3%.     Recent labs reviewed.   - I have re-counseled the patient on diet management and weight loss  by adopting a carbohydrate restricted / protein rich  Diet.  - Suggestion is made for patient to avoid simple carbohydrates   from their diet including Cakes , Desserts, Ice Cream,  Soda (  diet and regular) , Sweet Tea , Candies,  Chips, Cookies, Artificial Sweeteners,   and "Sugar-free" Products .  This will help patient to have stable blood glucose profile and potentially avoid unintended  Weight gain.  - Patient is advised to stick to a routine mealtimes to eat 3 meals  a day and avoid unnecessary snacks ( to snack only to correct hypoglycemia).  -  The patient  has been  scheduled with Jearld Fenton, RDN, CDE for individualized DM education.  - I have approached patient with the following individualized plan to manage diabetes and patient agrees.   - In view of his recent improvement in glycemic profile, he wishes to stay on Lantus rather than switching to premixed Novolin 70/30. - I advised him to continue Lantus  45 units daily at bedtime  associated with monitoring of blood glucose 2 times a day- daily before breakfast and at bedtime.  I will continue MTF ER 1000mg  po qday. - He did not tolerate Invokana ,  which was causing  frequent genital candidiasis and was discontinued.   - Patient specific target  for A1c; LDL, HDL, Triglycerides, and  Waist Circumference were discussed in detail.  2) BP/HTN:  Controlled. Continue current medications including ACEI/ARB. 3) Lipids/HPL:  continue statins. 4)  Weight/Diet: Gained 7 pounds since last visit, CDE consult in progress, exercise, and carbohydrates information provided.  5) Chronic Care/Health Maintenance:  -Patient is on ACEI/ARB and Statin medications and encouraged to continue to follow up with Ophthalmology, Podiatrist at least yearly or according to recommendations, and advised to  stay away from smoking. I have recommended yearly flu vaccine and pneumonia vaccination at least every 5 years; moderate intensity exercise for up to 150 minutes weekly; and  sleep for at least 7 hours a day.  - 25 minutes of time was spent on the care of this patient , 50% of which was applied for counseling on diabetes complications and their preventions.  - I advised patient to maintain close follow up with Dione Housekeeper, MD for primary care needs.  Patient is asked to bring meter and  blood glucose logs during his next visit.   Follow up plan: -Return in about 3 months (around 01/20/2017) for follow up with pre-visit labs, meter, and logs.  Glade Lloyd, MD Phone: [phone]  Fax: [phone]   10/20/2016, 9:51 AM

## 2016-10-20 NOTE — Patient Instructions (Signed)

## 2016-11-28 ENCOUNTER — Other Ambulatory Visit: Payer: Self-pay | Admitting: "Endocrinology

## 2017-01-07 LAB — LIPID PANEL
CHOLESTEROL: 117 (ref 0–200)
HDL: 35 (ref 35–70)
LDL CALC: 48
TRIGLYCERIDES: 168 — AB (ref 40–160)

## 2017-01-07 LAB — HEMOGLOBIN A1C: HEMOGLOBIN A1C: 7.4

## 2017-01-07 LAB — BASIC METABOLIC PANEL
BUN: 15 (ref 4–21)
Creatinine: 1.2 (ref <=1.3)

## 2017-01-13 ENCOUNTER — Other Ambulatory Visit: Payer: Self-pay | Admitting: "Endocrinology

## 2017-01-19 ENCOUNTER — Encounter: Payer: Self-pay | Admitting: "Endocrinology

## 2017-01-19 ENCOUNTER — Ambulatory Visit (INDEPENDENT_AMBULATORY_CARE_PROVIDER_SITE_OTHER): Payer: Medicare HMO | Admitting: "Endocrinology

## 2017-01-19 VITALS — BP 135/82 | HR 64 | Wt 202.0 lb

## 2017-01-19 DIAGNOSIS — E782 Mixed hyperlipidemia: Secondary | ICD-10-CM | POA: Diagnosis not present

## 2017-01-19 DIAGNOSIS — I10 Essential (primary) hypertension: Secondary | ICD-10-CM

## 2017-01-19 DIAGNOSIS — Z6832 Body mass index (BMI) 32.0-32.9, adult: Secondary | ICD-10-CM

## 2017-01-19 DIAGNOSIS — E1165 Type 2 diabetes mellitus with hyperglycemia: Secondary | ICD-10-CM

## 2017-01-19 DIAGNOSIS — E6609 Other obesity due to excess calories: Secondary | ICD-10-CM

## 2017-01-19 DIAGNOSIS — IMO0001 Reserved for inherently not codable concepts without codable children: Secondary | ICD-10-CM

## 2017-01-19 NOTE — Patient Instructions (Signed)

## 2017-01-19 NOTE — Progress Notes (Signed)
Subjective:    Patient ID: Patrick Moss, male    DOB: Nov 16, 1940, PCP Dione Housekeeper, MD   Past Medical History:  Diagnosis Date  . Diabetes mellitus without complication ([place])   . Hyperlipidemia   . Hypertension    Past Surgical History:  Procedure Laterality Date  . R foot fx     Social History   Social History  . Marital status: Divorced    Spouse name: N/A  . Number of children: N/A  . Years of education: N/A   Social History Main Topics  . Smoking status: Never Smoker  . Smokeless tobacco: Never Used  . Alcohol use None  . Drug use: Unknown  . Sexual activity: Not Asked   Other Topics Concern  . None   Social History Narrative  . None   Outpatient Encounter Prescriptions as of 01/19/2017  Medication Sig  . amLODipine (NORVASC) 5 MG tablet Take 5 mg by mouth daily.  Marland Kitchen aspirin 81 MG tablet Take 81 mg by mouth daily.  . B-D ULTRAFINE III SHORT PEN 31G X 8 MM MISC USE  1  PEN  NEEDLE AS DIRECTED  . Insulin Glargine (LANTUS SOLOSTAR) 100 UNIT/ML Solostar Pen Inject 45 Units into the skin at bedtime.  Marland Kitchen losartan-hydrochlorothiazide (HYZAAR) 100-25 MG per tablet Take 1 tablet by mouth daily.  . metFORMIN (GLUCOPHAGE-XR) 500 MG 24 hr tablet Take 1,000 mg by mouth daily after breakfast.  . Omega-3 Fatty Acids (FISH OIL) 1000 MG CAPS Take 1,000 mg by mouth 2 (two) times daily.  . rosuvastatin (CRESTOR) 5 MG tablet Take 5 mg by mouth daily.   No facility-administered encounter medications on file as of 01/19/2017.    ALLERGIES: No Known Allergies VACCINATION STATUS:  There is no immunization history on file for this patient.  Diabetes  He presents for his follow-up diabetic visit. He has type 2 diabetes mellitus. Onset time: He was diagnosed at approximate age of 2 years. His disease course has been improving. There are no hypoglycemic associated symptoms. Pertinent negatives for hypoglycemia include no confusion, headaches, pallor or seizures. There are no  diabetic associated symptoms. Pertinent negatives for diabetes include no chest pain, no fatigue, no polydipsia, no polyphagia, no polyuria and no weakness. There are no hypoglycemic complications. Symptoms are improving. There are no diabetic complications. Risk factors for coronary artery disease include diabetes mellitus, dyslipidemia, male sex, obesity and sedentary lifestyle. He is compliant with treatment most of the time. His weight is stable. He is following a generally unhealthy diet. He participates in exercise intermittently. His home blood glucose trend is increasing steadily. His breakfast blood glucose range is generally 140-180 mg/dl. His bedtime blood glucose range is generally 140-180 mg/dl. His overall blood glucose range is 140-180 mg/dl. An ACE inhibitor/angiotensin II receptor blocker is being taken.  Hyperlipidemia  This is a chronic problem. The current episode started more than 1 year ago. Pertinent negatives include no chest pain, myalgias or shortness of breath. Current antihyperlipidemic treatment includes statins. Risk factors for coronary artery disease include dyslipidemia, hypertension, male sex, obesity and a sedentary lifestyle.  Hypertension  Pertinent negatives include no chest pain, headaches, neck pain, palpitations or shortness of breath.     Review of Systems  Constitutional: Negative for fatigue and unexpected weight change.  HENT: Negative for dental problem, mouth sores and trouble swallowing.   Eyes: Negative for visual disturbance.  Respiratory: Negative for cough, choking, chest tightness, shortness of breath and wheezing.   Cardiovascular: Negative for  chest pain, palpitations and leg swelling.  Gastrointestinal: Negative for abdominal distention, abdominal pain, constipation, diarrhea, nausea and vomiting.  Endocrine: Negative for polydipsia, polyphagia and polyuria.  Genitourinary: Negative for dysuria, flank pain, hematuria and urgency.   Musculoskeletal: Negative for back pain, gait problem, myalgias and neck pain.  Skin: Negative for pallor, rash and wound.  Neurological: Negative for seizures, syncope, weakness, numbness and headaches.  Psychiatric/Behavioral: Negative.  Negative for confusion and dysphoric mood.    Objective:    BP 135/82   Pulse 64   Wt 202 lb (91.6 kg)   SpO2 98%   BMI 32.60 kg/m   Wt Readings from Last 3 Encounters:  01/19/17 202 lb (91.6 kg)  10/20/16 200 lb (90.7 kg)  10/14/16 207 lb (93.9 kg)    Physical Exam  Constitutional: He is oriented to person, place, and time. He appears well-developed and well-nourished. He is cooperative. No distress.  HENT:  Head: Normocephalic and atraumatic.  Eyes: EOM are normal.  Neck: Normal range of motion. Neck supple. No tracheal deviation present. No thyromegaly present.  Cardiovascular: Normal rate, S1 normal, S2 normal and normal heart sounds.  Exam reveals no gallop.   No murmur heard. Pulses:      Dorsalis pedis pulses are 1+ on the right side, and 1+ on the left side.       Posterior tibial pulses are 1+ on the right side, and 1+ on the left side.  Pulmonary/Chest: Breath sounds normal. No respiratory distress. He has no wheezes.  Abdominal: Soft. Bowel sounds are normal. He exhibits no distension. There is no tenderness. There is no guarding and no CVA tenderness.  Musculoskeletal: He exhibits no edema.       Right shoulder: He exhibits no swelling and no deformity.  Neurological: He is alert and oriented to person, place, and time. He has normal strength and normal reflexes. No cranial nerve deficit or sensory deficit. Gait normal.  Skin: Skin is warm and dry. No rash noted. No cyanosis. Nails show no clubbing.  Psychiatric: He has a normal mood and affect. His speech is normal and behavior is normal. Judgment and thought content normal. Cognition and memory are normal.   Recent Results (from the past 2160 hour(s))  Basic metabolic panel      Status: None   Collection Time: 01/07/17 12:00 AM  Result Value Ref Range   BUN 15 4 - 21   Creatinine 1.2 0.6 - 1.3  Lipid panel     Status: Abnormal   Collection Time: 01/07/17 12:00 AM  Result Value Ref Range   Triglycerides 168 (A) 40 - 160   Cholesterol 117 0 - 200   HDL 35 35 - 70   LDL Cholesterol 48   Hemoglobin A1c     Status: None   Collection Time: 01/07/17 12:00 AM  Result Value Ref Range   Hemoglobin A1C 7.4     Assessment & Plan:   1. Uncontrolled type 2 diabetes mellitus without complication, without long-term current use of insulin ([facility])   Pt remains at a high risk for more acute and chronic complications of diabetes which include CAD, CVA, CKD, retinopathy, and neuropathy. These are all discussed in detail with the patient.3 -  He came with controlled glycemic profile . - His recent A1c has to 7.4% from  9.3%,   associated with improving renal function and lipid profile.     Recent labs reviewed.   - I have re-counseled the patient on diet  management and weight loss  by adopting a carbohydrate restricted / protein rich  Diet.  - Suggestion is made for patient to avoid simple carbohydrates   from his diet including Cakes , Desserts, Ice Cream,  Soda (  diet and regular) , Sweet Tea , Candies,  Chips, Cookies, Artificial Sweeteners,   and "Sugar-free" Products .  This will help patient to have stable blood glucose profile and potentially avoid unintended  Weight gain.  - Patient is advised to stick to a routine mealtimes to eat 3 meals  a day and avoid unnecessary snacks ( to snack only to correct hypoglycemia).  - The patient  has been  scheduled with Jearld Fenton, RDN, CDE for individualized DM education.  - I have approached patient with the following individualized plan to manage diabetes and patient agrees.   -  he wishes to stay on Lantus ,  I advised him to continue Lantus  45 units daily at bedtime  associated with monitoring of blood glucose 2 times  a day- daily before breakfast and at bedtime.  I will continue  metformin ER 1000mg  po qday. - He did not tolerate Invokana ,  which was causing  frequent genital candidiasis and was discontinued.   - Patient specific target  for A1c; LDL, HDL, Triglycerides, and  Waist Circumference were discussed in detail.  2) BP/HTN: Controlled. Continue current medications including ACEI/ARB. 3) Lipids/HPL:  continue statins. 4)  Weight/Diet:  he is gaining weight,  CDE consult in progress, exercise, and carbohydrates information provided.  5) Chronic Care/Health Maintenance:  -Patient is on ACEI/ARB and Statin medications and encouraged to continue to follow up with Ophthalmology, Podiatrist at least yearly or according to recommendations, and advised to  stay away from smoking. I have recommended yearly flu vaccine and pneumonia vaccination at least every 5 years; moderate intensity exercise for up to 150 minutes weekly; and  sleep for at least 7 hours a day.  - 20 minutes of time was spent on the care of this patient , 50% of which was applied for counseling on diabetes complications and their preventions.  - I advised patient to maintain close follow up with Dione Housekeeper, MD for primary care needs.  Patient is asked to bring meter and  blood glucose logs during his next visit.   Follow up plan: -Return in about 3 months (around 04/21/2017) for meter, and logs.  Patrick Lloyd, MD Phone: [phone]  Fax: [phone]   01/19/2017, 9:37 AM

## 2017-01-27 ENCOUNTER — Other Ambulatory Visit: Payer: Self-pay

## 2017-01-27 MED ORDER — INSULIN GLARGINE 100 UNIT/ML SOLOSTAR PEN: 45.0000 [IU] | PEN_INJECTOR | Freq: Every day | SUBCUTANEOUS | 2 refills | Status: DC

## 2017-01-29 ENCOUNTER — Other Ambulatory Visit: Payer: Self-pay

## 2017-01-29 MED ORDER — INSULIN GLARGINE 100 UNIT/ML SOLOSTAR PEN: 45.0000 [IU] | PEN_INJECTOR | Freq: Every day | SUBCUTANEOUS | 2 refills | Status: DC

## 2017-02-04 ENCOUNTER — Other Ambulatory Visit: Payer: Self-pay

## 2017-02-04 MED ORDER — INSULIN GLARGINE 100 UNIT/ML SOLOSTAR PEN: 45.0000 [IU] | PEN_INJECTOR | Freq: Every day | SUBCUTANEOUS | 0 refills | Status: DC

## 2017-04-08 LAB — LIPID PANEL
CHOLESTEROL: 145 (ref 0–200)
HDL: 34 — AB (ref 35–70)
LDL Cholesterol: 63
Triglycerides: 234 — AB (ref 40–160)

## 2017-04-08 LAB — BASIC METABOLIC PANEL
BUN: 12 (ref 4–21)
CREATININE: 1.2 (ref <=1.3)

## 2017-04-08 LAB — HEMOGLOBIN A1C: Hemoglobin A1C: 8

## 2017-04-21 ENCOUNTER — Encounter: Payer: Self-pay | Admitting: "Endocrinology

## 2017-04-21 ENCOUNTER — Ambulatory Visit: Payer: Medicare HMO | Admitting: "Endocrinology

## 2017-04-21 VITALS — BP 135/73 | Ht 66.0 in | Wt 204.0 lb

## 2017-04-21 DIAGNOSIS — E782 Mixed hyperlipidemia: Secondary | ICD-10-CM

## 2017-04-21 DIAGNOSIS — E6609 Other obesity due to excess calories: Secondary | ICD-10-CM

## 2017-04-21 DIAGNOSIS — I10 Essential (primary) hypertension: Secondary | ICD-10-CM

## 2017-04-21 DIAGNOSIS — IMO0001 Reserved for inherently not codable concepts without codable children: Secondary | ICD-10-CM

## 2017-04-21 DIAGNOSIS — Z6832 Body mass index (BMI) 32.0-32.9, adult: Secondary | ICD-10-CM

## 2017-04-21 DIAGNOSIS — E1165 Type 2 diabetes mellitus with hyperglycemia: Secondary | ICD-10-CM | POA: Diagnosis not present

## 2017-04-21 MED ORDER — BASAGLAR KWIKPEN 100 UNIT/ML ~~LOC~~ SOPN: 55.0000 [IU] | PEN_INJECTOR | Freq: Every day | SUBCUTANEOUS | 2 refills | Status: DC

## 2017-04-21 NOTE — Progress Notes (Signed)
Subjective:    Patient ID: Patrick Moss, male    DOB: [birth year], PCP Dione Housekeeper, MD   Past Medical History:  Diagnosis Date  . Diabetes mellitus without complication ([place])   . Hyperlipidemia   . Hypertension    Past Surgical History:  Procedure Laterality Date  . R foot fx     Social History   Socioeconomic History  . Marital status: Divorced    Spouse name: None  . Number of children: None  . Years of education: None  . Highest education level: None  Social Needs  . Financial resource strain: None  . Food insecurity - worry: None  . Food insecurity - inability: None  . Transportation needs - medical: None  . Transportation needs - non-medical: None  Occupational History  . None  Tobacco Use  . Smoking status: Never Smoker  . Smokeless tobacco: Never Used  Substance and Sexual Activity  . Alcohol use: No    Alcohol/week: 0.0 oz    Frequency: Never  . Drug use: No  . Sexual activity: None  Other Topics Concern  . None  Social History Narrative  . None   Outpatient Encounter Medications as of 04/21/2017  Medication Sig  . amLODipine (NORVASC) 5 MG tablet Take 5 mg by mouth daily.  Marland Kitchen aspirin 81 MG tablet Take 81 mg by mouth daily.  . B-D ULTRAFINE III SHORT PEN 31G X 8 MM MISC USE  1  PEN  NEEDLE AS DIRECTED  . Insulin Glargine (BASAGLAR KWIKPEN) 100 UNIT/ML SOPN Inject 0.55 mLs (55 Units total) at bedtime into the skin.  Marland Kitchen losartan-hydrochlorothiazide (HYZAAR) 100-25 MG per tablet Take 1 tablet by mouth daily.  . metFORMIN (GLUCOPHAGE-XR) 500 MG 24 hr tablet Take 1,000 mg by mouth daily after breakfast.  . Omega-3 Fatty Acids (FISH OIL) 1000 MG CAPS Take 1,000 mg by mouth 2 (two) times daily.  . rosuvastatin (CRESTOR) 5 MG tablet Take 5 mg by mouth daily.  . [DISCONTINUED] Insulin Glargine (LANTUS SOLOSTAR) 100 UNIT/ML Solostar Pen Inject 45 Units into the skin at bedtime.   No facility-administered encounter medications on file as of 04/21/2017.     ALLERGIES: No Known Allergies VACCINATION STATUS:  There is no immunization history on file for this patient.  Diabetes  He presents for his follow-up diabetic visit. He has type 2 diabetes mellitus. Onset time: He was diagnosed at approximate age of 9 years. His disease course has been stable. There are no hypoglycemic associated symptoms. Pertinent negatives for hypoglycemia include no confusion, headaches, pallor or seizures. There are no diabetic associated symptoms. Pertinent negatives for diabetes include no chest pain, no fatigue, no polydipsia, no polyphagia, no polyuria and no weakness. There are no hypoglycemic complications. Symptoms are stable. There are no diabetic complications. Risk factors for coronary artery disease include diabetes mellitus, dyslipidemia, male sex, obesity and sedentary lifestyle. He is compliant with treatment most of the time. His weight is stable. He is following a generally unhealthy diet. He participates in exercise intermittently. His home blood glucose trend is increasing steadily. His breakfast blood glucose range is generally 140-180 mg/dl. His bedtime blood glucose range is generally 140-180 mg/dl. His overall blood glucose range is 140-180 mg/dl. An ACE inhibitor/angiotensin II receptor blocker is being taken.  Hyperlipidemia  This is a chronic problem. The current episode started more than 1 year ago. Pertinent negatives include no chest pain, myalgias or shortness of breath. Current antihyperlipidemic treatment includes statins. Risk factors for coronary artery  disease include dyslipidemia, hypertension, male sex, obesity and a sedentary lifestyle.  Hypertension  Pertinent negatives include no chest pain, headaches, neck pain, palpitations or shortness of breath.     Review of Systems  Constitutional: Negative for fatigue and unexpected weight change.  HENT: Negative for dental problem, mouth sores and trouble swallowing.   Eyes: Negative for  visual disturbance.  Respiratory: Negative for cough, choking, chest tightness, shortness of breath and wheezing.   Cardiovascular: Negative for chest pain, palpitations and leg swelling.  Gastrointestinal: Negative for abdominal distention, abdominal pain, constipation, diarrhea, nausea and vomiting.  Endocrine: Negative for polydipsia, polyphagia and polyuria.  Genitourinary: Negative for dysuria, flank pain, hematuria and urgency.  Musculoskeletal: Negative for back pain, gait problem, myalgias and neck pain.  Skin: Negative for pallor, rash and wound.  Neurological: Negative for seizures, syncope, weakness, numbness and headaches.  Psychiatric/Behavioral: Negative.  Negative for confusion and dysphoric mood.    Objective:    BP 135/73 (BP Location: Right Arm, Patient Position: Sitting, Cuff Size: Normal)   Ht 5\' 6"  (1.676 m)   Wt 204 lb (92.5 kg)   BMI 32.93 kg/m   Wt Readings from Last 3 Encounters:  04/21/17 204 lb (92.5 kg)  01/19/17 202 lb (91.6 kg)  10/20/16 200 lb (90.7 kg)    Physical Exam  Constitutional: He is oriented to person, place, and time. He appears well-developed and well-nourished. He is cooperative. No distress.  HENT:  Head: Normocephalic and atraumatic.  Eyes: EOM are normal.  Neck: Normal range of motion. Neck supple. No tracheal deviation present. No thyromegaly present.  Cardiovascular: Normal rate, S1 normal, S2 normal and normal heart sounds. Exam reveals no gallop.  No murmur heard. Pulses:      Dorsalis pedis pulses are 1+ on the right side, and 1+ on the left side.       Posterior tibial pulses are 1+ on the right side, and 1+ on the left side.  Pulmonary/Chest: Breath sounds normal. No respiratory distress. He has no wheezes.  Abdominal: Soft. Bowel sounds are normal. He exhibits no distension. There is no tenderness. There is no guarding and no CVA tenderness.  Musculoskeletal: He exhibits no edema.       Right shoulder: He exhibits no  swelling and no deformity.  Neurological: He is alert and oriented to person, place, and time. He has normal strength and normal reflexes. No cranial nerve deficit or sensory deficit. Gait normal.  Skin: Skin is warm and dry. No rash noted. No cyanosis. Nails show no clubbing.  Psychiatric: He has a normal mood and affect. His speech is normal and behavior is normal. Judgment and thought content normal. Cognition and memory are normal.   No results found for this or any previous visit (from the past 2160 hour(s)).  Assessment & Plan:   1. Uncontrolled type 2 diabetes mellitus without complication, without long-term current use of insulin ([facility])   Pt remains at a high risk for more acute and chronic complications of diabetes which include CAD, CVA, CKD, retinopathy, and neuropathy. These are all discussed in detail with the patient.  -  He came with controlled glycemic profile . - His recent A1c increases to 8 %, after progressively improving from  9.3%.    Recent labs reviewed.   - I have re-counseled the patient on diet management and weight loss  by adopting a carbohydrate restricted / protein rich  Diet.  -  Suggestion is made for him to avoid simple  carbohydrates  from his diet including Cakes, Sweet Desserts / Pastries, Ice Cream, Soda (diet and regular), Sweet Tea, Candies, Chips, Cookies, Store Bought Juices, Alcohol in Excess of  1-2 drinks a day, Artificial Sweeteners, and "Sugar-free" Products. This will help patient to have stable blood glucose profile and potentially avoid unintended weight gain.   - Patient is advised to stick to a routine mealtimes to eat 3 meals  a day and avoid unnecessary snacks ( to snack only to correct hypoglycemia).  - I have approached patient with the following individualized plan to manage diabetes and patient agrees.   -  he wishes to get the prescription for Basaglar instead of Lantus ,  I advised him to increase  Basaglar to 55  units daily at  bedtime  associated with monitoring of blood glucose 2 times a day- daily before breakfast and at bedtime.  I will continue  metformin ER 1000mg  po qday.  - Patient specific target  for A1c; LDL, HDL, Triglycerides, and  Waist Circumference were discussed in detail.  2) BP/HTN:   Controlled. I advised him to continue current medications including  ACEI/ARB. 3) Lipids/HPL:  continue statins. 4)  Weight/Diet:   no success and weight loss, CDE consult in progress, exercise, and carbohydrates information provided.  5) Chronic Care/Health Maintenance:  -Patient is on ACEI/ARB and Statin medications and encouraged to continue to follow up with Ophthalmology, Podiatrist at least yearly or according to recommendations, and advised to  stay away from smoking. I have recommended yearly flu vaccine and pneumonia vaccination at least every 5 years; moderate intensity exercise for up to 150 minutes weekly; and  sleep for at least 7 hours a day.  - I advised patient to maintain close follow up with Dione Housekeeper, MD for primary care needs.  - Time spent with the patient: 25 min, of which >50% was spent in reviewing his sugar logs , discussing his hypo- and hyper-glycemic episodes, reviewing his current and  previous labs and insulin doses and developing a plan to avoid hypo- and hyper-glycemia.    Follow up plan: -Return in about 3 months (around 07/22/2017) for meter, and logs, follow up with pre-visit labs, meter, and logs.  Glade Lloyd, MD Phone: [phone]  Fax: [phone]  -  This note was partially dictated with voice recognition software. Similar sounding words can be transcribed inadequately or may not  be corrected upon review.  04/21/2017, 9:19 AM

## 2017-04-21 NOTE — Patient Instructions (Signed)

## 2017-04-22 ENCOUNTER — Other Ambulatory Visit: Payer: Self-pay | Admitting: "Endocrinology

## 2017-04-23 NOTE — Telephone Encounter (Signed)
Basaglar is not covered. Pt is requesting Lantus again

## 2017-07-10 LAB — LIPID PANEL
Cholesterol: 139 (ref 0–200)
HDL: 36 (ref 35–70)
LDL CALC: 52
TRIGLYCERIDES: 256 — AB (ref 40–160)

## 2017-07-10 LAB — BASIC METABOLIC PANEL
BUN: 11 (ref 4–21)
Creatinine: 1.2 (ref <=1.3)

## 2017-07-10 LAB — HEMOGLOBIN A1C: HEMOGLOBIN A1C: 8.5

## 2017-07-20 ENCOUNTER — Ambulatory Visit: Payer: Medicare HMO | Admitting: "Endocrinology

## 2017-07-20 ENCOUNTER — Encounter: Payer: Self-pay | Admitting: "Endocrinology

## 2017-07-20 VITALS — BP 141/75 | HR 62 | Ht 66.0 in | Wt 207.0 lb

## 2017-07-20 DIAGNOSIS — E6609 Other obesity due to excess calories: Secondary | ICD-10-CM | POA: Diagnosis not present

## 2017-07-20 DIAGNOSIS — E782 Mixed hyperlipidemia: Secondary | ICD-10-CM | POA: Diagnosis not present

## 2017-07-20 DIAGNOSIS — E1165 Type 2 diabetes mellitus with hyperglycemia: Secondary | ICD-10-CM | POA: Diagnosis not present

## 2017-07-20 DIAGNOSIS — Z6832 Body mass index (BMI) 32.0-32.9, adult: Secondary | ICD-10-CM

## 2017-07-20 DIAGNOSIS — I10 Essential (primary) hypertension: Secondary | ICD-10-CM | POA: Diagnosis not present

## 2017-07-20 DIAGNOSIS — IMO0001 Reserved for inherently not codable concepts without codable children: Secondary | ICD-10-CM

## 2017-07-20 MED ORDER — INSULIN GLARGINE 100 UNIT/ML SOLOSTAR PEN: 60.0000 [IU] | PEN_INJECTOR | Freq: Every day | SUBCUTANEOUS | 2 refills | Status: DC

## 2017-07-20 NOTE — Progress Notes (Signed)
Subjective:    Patient ID: Patrick Moss, male    DOB: 1941/05/13, PCP Dione Housekeeper, MD   Past Medical History:  Diagnosis Date  . Diabetes mellitus without complication ([place])   . Hyperlipidemia   . Hypertension    Past Surgical History:  Procedure Laterality Date  . R foot fx     Social History   Socioeconomic History  . Marital status: Divorced    Spouse name: None  . Number of children: None  . Years of education: None  . Highest education level: None  Social Needs  . Financial resource strain: None  . Food insecurity - worry: None  . Food insecurity - inability: None  . Transportation needs - medical: None  . Transportation needs - non-medical: None  Occupational History  . None  Tobacco Use  . Smoking status: Never Smoker  . Smokeless tobacco: Never Used  Substance and Sexual Activity  . Alcohol use: No    Alcohol/week: 0.0 oz    Frequency: Never  . Drug use: No  . Sexual activity: None  Other Topics Concern  . None  Social History Narrative  . None   Outpatient Encounter Medications as of 07/20/2017  Medication Sig  . amLODipine (NORVASC) 5 MG tablet Take 5 mg by mouth daily.  Marland Kitchen aspirin 81 MG tablet Take 81 mg by mouth daily.  . B-D ULTRAFINE III SHORT PEN 31G X 8 MM MISC USE  1  PEN  NEEDLE AS DIRECTED  . Insulin Glargine (LANTUS SOLOSTAR) 100 UNIT/ML Solostar Pen Inject 60 Units into the skin daily at 10 pm.  . losartan-hydrochlorothiazide (HYZAAR) 100-25 MG per tablet Take 1 tablet by mouth daily.  . metFORMIN (GLUCOPHAGE-XR) 500 MG 24 hr tablet Take 1,000 mg by mouth daily after breakfast.  . Omega-3 Fatty Acids (FISH OIL) 1000 MG CAPS Take 1,000 mg by mouth 2 (two) times daily.  . rosuvastatin (CRESTOR) 5 MG tablet Take 5 mg by mouth daily.  . [DISCONTINUED] LANTUS SOLOSTAR 100 UNIT/ML Solostar Pen INJECT 45 UNITS INTO THE SKIN AT BEDTIME. (Patient taking differently: INJECT 55 UNITS INTO THE SKIN AT BEDTIME.)   No facility-administered  encounter medications on file as of 07/20/2017.    ALLERGIES: No Known Allergies VACCINATION STATUS:  There is no immunization history on file for this patient.  Diabetes  He presents for his follow-up diabetic visit. He has type 2 diabetes mellitus. Onset time: He was diagnosed at approximate age of 83 years. His disease course has been worsening. There are no hypoglycemic associated symptoms. Pertinent negatives for hypoglycemia include no confusion, headaches, pallor or seizures. Associated symptoms include polydipsia and polyuria. Pertinent negatives for diabetes include no chest pain, no fatigue, no polyphagia and no weakness. There are no hypoglycemic complications. Symptoms are worsening. There are no diabetic complications. Risk factors for coronary artery disease include diabetes mellitus, dyslipidemia, male sex, obesity and sedentary lifestyle. He is compliant with treatment most of the time. His weight is increasing steadily. He is following a generally unhealthy diet. He participates in exercise intermittently. His home blood glucose trend is increasing steadily. His breakfast blood glucose range is generally 140-180 mg/dl. His bedtime blood glucose range is generally 140-180 mg/dl. His overall blood glucose range is 140-180 mg/dl. An ACE inhibitor/angiotensin II receptor blocker is being taken.  Hyperlipidemia  This is a chronic problem. The current episode started more than 1 year ago. Exacerbating diseases include diabetes and obesity. Pertinent negatives include no chest pain, myalgias or shortness  of breath. Current antihyperlipidemic treatment includes statins. Risk factors for coronary artery disease include dyslipidemia, hypertension, male sex, obesity and a sedentary lifestyle.  Hypertension  This is a chronic problem. The current episode started more than 1 year ago. The problem is uncontrolled. Pertinent negatives include no chest pain, headaches, neck pain, palpitations or  shortness of breath. Risk factors for coronary artery disease include dyslipidemia, diabetes mellitus, obesity and sedentary lifestyle. Past treatments include ACE inhibitors and angiotensin blockers.     Review of Systems  Constitutional: Negative for fatigue and unexpected weight change.  HENT: Negative for dental problem, mouth sores and trouble swallowing.   Eyes: Negative for visual disturbance.  Respiratory: Negative for cough, choking, chest tightness, shortness of breath and wheezing.   Cardiovascular: Negative for chest pain, palpitations and leg swelling.  Gastrointestinal: Negative for abdominal distention, abdominal pain, constipation, diarrhea, nausea and vomiting.  Endocrine: Positive for polydipsia and polyuria. Negative for polyphagia.  Genitourinary: Negative for dysuria, flank pain, hematuria and urgency.  Musculoskeletal: Negative for back pain, gait problem, myalgias and neck pain.  Skin: Negative for pallor, rash and wound.  Neurological: Negative for seizures, syncope, weakness, numbness and headaches.  Psychiatric/Behavioral: Negative.  Negative for confusion and dysphoric mood.    Objective:    BP (!) 141/75   Pulse 62   Ht 5\' 6"  (1.676 m)   Wt 207 lb (93.9 kg)   BMI 33.41 kg/m   Wt Readings from Last 3 Encounters:  07/20/17 207 lb (93.9 kg)  04/21/17 204 lb (92.5 kg)  01/19/17 202 lb (91.6 kg)    Physical Exam  Constitutional: He is oriented to person, place, and time. He appears well-developed and well-nourished. He is cooperative. No distress.  HENT:  Head: Normocephalic and atraumatic.  Eyes: EOM are normal.  Neck: Normal range of motion. Neck supple. No tracheal deviation present. No thyromegaly present.  Cardiovascular: Normal rate, S1 normal, S2 normal and normal heart sounds. Exam reveals no gallop.  No murmur heard. Pulses:      Dorsalis pedis pulses are 1+ on the right side, and 1+ on the left side.       Posterior tibial pulses are 1+ on  the right side, and 1+ on the left side.  Pulmonary/Chest: Breath sounds normal. No respiratory distress. He has no wheezes.  Abdominal: Soft. Bowel sounds are normal. He exhibits no distension. There is no tenderness. There is no guarding and no CVA tenderness.  Musculoskeletal: He exhibits no edema.       Right shoulder: He exhibits no swelling and no deformity.  Neurological: He is alert and oriented to person, place, and time. He has normal strength and normal reflexes. No cranial nerve deficit or sensory deficit. Gait normal.  Skin: Skin is warm and dry. No rash noted. No cyanosis. Nails show no clubbing.  Psychiatric: He has a normal mood and affect. His speech is normal and behavior is normal. Judgment and thought content normal. Cognition and memory are normal.   Recent Results (from the past 2160 hour(s))  Basic metabolic panel     Status: None   Collection Time: 07/10/17 12:00 AM  Result Value Ref Range   BUN 11 4 - 21   Creatinine 1.2 0.6 - 1.3  Lipid panel     Status: Abnormal   Collection Time: 07/10/17 12:00 AM  Result Value Ref Range   Triglycerides 256 (A) 40 - 160   Cholesterol 139 0 - 200   HDL 36 35 -  70   LDL Cholesterol 52   Hemoglobin A1c     Status: None   Collection Time: 07/10/17 12:00 AM  Result Value Ref Range   Hemoglobin A1C 8.5     Assessment & Plan:   1. Uncontrolled type 2 diabetes mellitus without complication, without long-term current use of insulin ([facility])   Pt remains at a high risk for more acute and chronic complications of diabetes which include CAD, CVA, CKD, retinopathy, and neuropathy. These are all discussed in detail with the patient.  -  He came with controlled fasting, significantly above target postprandial blood glucose readings averaging 174.  Most recent A1c is higher at 8.5% from 8%  -No documented nor reported hypoglycemia.   Recent labs reviewed. - I have re-counseled the patient on diet management and weight loss  by adopting a  carbohydrate restricted / protein rich  Diet.  -  Suggestion is made for him to avoid simple carbohydrates  from his diet including Cakes, Sweet Desserts / Pastries, Ice Cream, Soda (diet and regular), Sweet Tea, Candies, Chips, Cookies, Store Bought Juices, Alcohol in Excess of  1-2 drinks a day, Artificial Sweeteners, and "Sugar-free" Products. This will help patient to have stable blood glucose profile and potentially avoid unintended weight gain.  - Patient is advised to stick to a routine mealtimes to eat 3 meals  a day and avoid unnecessary snacks ( to snack only to correct hypoglycemia).  - I have approached patient with the following individualized plan to manage diabetes and patient agrees.   -He will need higher dose of insulin for him to achieve control of diabetes target.  -he has some  room on his basal insulin before considering prandial insulin.  - I advised him to increase  Basaglar to 60  units daily at bedtime  associated with monitoring of blood glucose 2 times a day- daily before breakfast and at bedtime.  I will continue  metformin ER 1000mg  po qday.  - Patient specific target  for A1c; LDL, HDL, Triglycerides, and  Waist Circumference were discussed in detail.  2) BP/HTN: His blood pressures not controlled to target, I advised him to continue his current blood pressure medications including HCTZ/ARB.  3) Lipids/HPL:  continue statins. 4)  Weight/Diet:   no success and weight loss, CDE consult in progress, exercise, and carbohydrates information provided.  5) Chronic Care/Health Maintenance:  -Patient is on ACEI/ARB and Statin medications and encouraged to continue to follow up with Ophthalmology, Podiatrist at least yearly or according to recommendations, and advised to  stay away from smoking. I have recommended yearly flu vaccine and pneumonia vaccination at least every 5 years; moderate intensity exercise for up to 150 minutes weekly; and  sleep for at least 7 hours a  day.  - I advised patient to maintain close follow up with Dione Housekeeper, MD for primary care needs.  - Time spent with the patient: 25 min, of which >50% was spent in reviewing his blood glucose logs , discussing his hypo- and hyper-glycemic episodes, reviewing his current and  previous labs and insulin doses and developing a plan to avoid hypo- and hyper-glycemia. Please refer to Patient Instructions for Blood Glucose Monitoring and Insulin/Medications Dosing Guide"  in media tab for additional information.  Follow up plan: -Return in about 3 months (around 10/17/2017) for follow up with pre-visit labs, meter, and logs.  Glade Lloyd, MD Phone: [phone]  Fax: [phone]  -  This note was partially dictated with voice recognition  software. Similar sounding words can be transcribed inadequately or may not  be corrected upon review.  07/20/2017, 10:16 AM

## 2017-07-20 NOTE — Patient Instructions (Signed)

## 2017-10-07 LAB — LIPID PANEL
Cholesterol: 142 (ref 0–200)
HDL: 36 (ref 35–70)
LDL CALC: 57
Triglycerides: 246 — AB (ref 40–160)

## 2017-10-07 LAB — BASIC METABOLIC PANEL
BUN: 15 (ref 4–21)
CREATININE: 1.3 (ref <=1.3)

## 2017-10-07 LAB — HEMOGLOBIN A1C: Hemoglobin A1C: 8.8

## 2017-10-19 ENCOUNTER — Ambulatory Visit: Payer: Medicare HMO | Admitting: "Endocrinology

## 2017-10-20 ENCOUNTER — Ambulatory Visit: Payer: Medicare HMO | Admitting: "Endocrinology

## 2017-10-22 ENCOUNTER — Encounter: Payer: Self-pay | Admitting: "Endocrinology

## 2017-10-22 ENCOUNTER — Ambulatory Visit: Payer: Medicare HMO | Admitting: "Endocrinology

## 2017-10-22 VITALS — BP 142/70 | HR 70 | Ht 66.0 in | Wt 209.0 lb

## 2017-10-22 DIAGNOSIS — E782 Mixed hyperlipidemia: Secondary | ICD-10-CM

## 2017-10-22 DIAGNOSIS — Z6832 Body mass index (BMI) 32.0-32.9, adult: Secondary | ICD-10-CM | POA: Diagnosis not present

## 2017-10-22 DIAGNOSIS — E1165 Type 2 diabetes mellitus with hyperglycemia: Secondary | ICD-10-CM | POA: Diagnosis not present

## 2017-10-22 DIAGNOSIS — IMO0001 Reserved for inherently not codable concepts without codable children: Secondary | ICD-10-CM

## 2017-10-22 DIAGNOSIS — I10 Essential (primary) hypertension: Secondary | ICD-10-CM

## 2017-10-22 DIAGNOSIS — E6609 Other obesity due to excess calories: Secondary | ICD-10-CM

## 2017-10-22 MED ORDER — INSULIN GLARGINE 100 UNIT/ML SOLOSTAR PEN: 70.0000 [IU] | PEN_INJECTOR | Freq: Every day | SUBCUTANEOUS | 2 refills | Status: DC

## 2017-10-22 NOTE — Progress Notes (Signed)
Subjective:    Patient ID: Patrick Moss, male    DOB: 02-12-1941, PCP Dione Housekeeper, MD   Past Medical History:  Diagnosis Date  . Diabetes mellitus without complication ([place])   . Hyperlipidemia   . Hypertension    Past Surgical History:  Procedure Laterality Date  . R foot fx     Social History   Socioeconomic History  . Marital status: Divorced    Spouse name: Not on file  . Number of children: Not on file  . Years of education: Not on file  . Highest education level: Not on file  Occupational History  . Not on file  Social Needs  . Financial resource strain: Not on file  . Food insecurity:    Worry: Not on file    Inability: Not on file  . Transportation needs:    Medical: Not on file    Non-medical: Not on file  Tobacco Use  . Smoking status: Never Smoker  . Smokeless tobacco: Never Used  Substance and Sexual Activity  . Alcohol use: No    Alcohol/week: 0.0 oz    Frequency: Never  . Drug use: No  . Sexual activity: Not on file  Lifestyle  . Physical activity:    Days per week: Not on file    Minutes per session: Not on file  . Stress: Not on file  Relationships  . Social connections:    Talks on phone: Not on file    Gets together: Not on file    Attends religious service: Not on file    Active member of club or organization: Not on file    Attends meetings of clubs or organizations: Not on file    Relationship status: Not on file  Other Topics Concern  . Not on file  Social History Narrative  . Not on file   Outpatient Encounter Medications as of 10/22/2017  Medication Sig  . amLODipine (NORVASC) 5 MG tablet Take 5 mg by mouth daily.  Marland Kitchen aspirin 81 MG tablet Take 81 mg by mouth daily.  . B-D ULTRAFINE III SHORT PEN 31G X 8 MM MISC USE  1  PEN  NEEDLE AS DIRECTED  . Insulin Glargine (LANTUS SOLOSTAR) 100 UNIT/ML Solostar Pen Inject 70 Units into the skin daily at 10 pm.  . losartan-hydrochlorothiazide (HYZAAR) 100-25 MG per tablet Take 1 tablet  by mouth daily.  . metFORMIN (GLUCOPHAGE-XR) 500 MG 24 hr tablet Take 1,000 mg by mouth daily after breakfast.  . Omega-3 Fatty Acids (FISH OIL) 1000 MG CAPS Take 1,000 mg by mouth 2 (two) times daily.  . rosuvastatin (CRESTOR) 5 MG tablet Take 5 mg by mouth daily.  . [DISCONTINUED] Insulin Glargine (LANTUS SOLOSTAR) 100 UNIT/ML Solostar Pen Inject 60 Units into the skin daily at 10 pm.   No facility-administered encounter medications on file as of 10/22/2017.    ALLERGIES: No Known Allergies VACCINATION STATUS:  There is no immunization history on file for this patient.  Diabetes  He presents for his follow-up diabetic visit. He has type 2 diabetes mellitus. Onset time: He was diagnosed at approximate age of 67 years. His disease course has been worsening. There are no hypoglycemic associated symptoms. Pertinent negatives for hypoglycemia include no confusion, headaches, pallor or seizures. Associated symptoms include polydipsia and polyuria. Pertinent negatives for diabetes include no chest pain, no fatigue, no polyphagia and no weakness. There are no hypoglycemic complications. Symptoms are worsening. There are no diabetic complications. Risk factors for coronary  artery disease include diabetes mellitus, dyslipidemia, male sex, obesity and sedentary lifestyle. He is compliant with treatment most of the time. His weight is fluctuating minimally. He is following a generally unhealthy diet. He participates in exercise intermittently. His home blood glucose trend is increasing steadily. His breakfast blood glucose range is generally 140-180 mg/dl. His bedtime blood glucose range is generally 140-180 mg/dl. His overall blood glucose range is 140-180 mg/dl. (He came with average blood glucose of 161 over the last 90 days, however his A1c is 8.8%) An ACE inhibitor/angiotensin II receptor blocker is being taken.  Hyperlipidemia  This is a chronic problem. The current episode started more than 1 year ago.  The problem is uncontrolled. Exacerbating diseases include diabetes and obesity. Pertinent negatives include no chest pain, myalgias or shortness of breath. Current antihyperlipidemic treatment includes statins. Risk factors for coronary artery disease include dyslipidemia, hypertension, male sex, obesity and a sedentary lifestyle.  Hypertension  This is a chronic problem. The current episode started more than 1 year ago. The problem is uncontrolled. Pertinent negatives include no chest pain, headaches, neck pain, palpitations or shortness of breath. Risk factors for coronary artery disease include dyslipidemia, diabetes mellitus, obesity and sedentary lifestyle. Past treatments include ACE inhibitors and angiotensin blockers.     Review of Systems  Constitutional: Negative for fatigue and unexpected weight change.  HENT: Negative for dental problem, mouth sores and trouble swallowing.   Eyes: Negative for visual disturbance.  Respiratory: Negative for cough, choking, chest tightness, shortness of breath and wheezing.   Cardiovascular: Negative for chest pain, palpitations and leg swelling.  Gastrointestinal: Negative for abdominal distention, abdominal pain, constipation, diarrhea, nausea and vomiting.  Endocrine: Positive for polydipsia and polyuria. Negative for polyphagia.  Genitourinary: Negative for dysuria, flank pain, hematuria and urgency.  Musculoskeletal: Negative for back pain, gait problem, myalgias and neck pain.  Skin: Negative for pallor, rash and wound.  Neurological: Negative for seizures, syncope, weakness, numbness and headaches.  Psychiatric/Behavioral: Negative.  Negative for confusion and dysphoric mood.    Objective:    BP (!) 142/70   Pulse 70   Ht 5\' 6"  (1.676 m)   Wt 209 lb (94.8 kg)   BMI 33.73 kg/m   Wt Readings from Last 3 Encounters:  10/22/17 209 lb (94.8 kg)  07/20/17 207 lb (93.9 kg)  04/21/17 204 lb (92.5 kg)    Physical Exam  Constitutional: He  is oriented to person, place, and time. He appears well-developed and well-nourished. He is cooperative. No distress.  HENT:  Head: Normocephalic and atraumatic.  Eyes: EOM are normal.  Neck: Normal range of motion. Neck supple. No tracheal deviation present. No thyromegaly present.  Cardiovascular: Normal rate, S1 normal and S2 normal. Exam reveals no gallop.  No murmur heard. Pulses:      Dorsalis pedis pulses are 1+ on the right side, and 1+ on the left side.       Posterior tibial pulses are 1+ on the right side, and 1+ on the left side.  Pulmonary/Chest: Effort normal. No respiratory distress. He has no wheezes.  Abdominal: He exhibits no distension. There is no tenderness. There is no guarding and no CVA tenderness.  Musculoskeletal: He exhibits no edema.       Right shoulder: He exhibits no swelling and no deformity.  Neurological: He is alert and oriented to person, place, and time. He has normal strength and normal reflexes. No cranial nerve deficit or sensory deficit. Gait normal.  Skin: Skin is  warm and dry. No rash noted. No cyanosis. Nails show no clubbing.  Psychiatric: He has a normal mood and affect. His speech is normal. Judgment normal. Cognition and memory are normal.   Recent Results (from the past 2160 hour(s))  Basic metabolic panel     Status: None   Collection Time: 10/07/17 12:00 AM  Result Value Ref Range   BUN 15 4 - 21   Creatinine 1.3 0.6 - 1.3  Lipid panel     Status: Abnormal   Collection Time: 10/07/17 12:00 AM  Result Value Ref Range   Triglycerides 246 (A) 40 - 160   Cholesterol 142 0 - 200   HDL 36 35 - 70   LDL Cholesterol 57   Hemoglobin A1c     Status: None   Collection Time: 10/07/17 12:00 AM  Result Value Ref Range   Hemoglobin A1C 8.8     Assessment & Plan:   1. Uncontrolled type 2 diabetes mellitus without complication, without long-term current use of insulin ([facility])   Pt remains at a high risk for more acute and chronic complications  of diabetes which include CAD, CVA, CKD, retinopathy, and neuropathy. These are all discussed in detail with the patient.  -  He came with controlled fasting, significantly above target postprandial blood glucose readings averaging 161, improving from 174.  Most recent A1c is higher at 8.8% from 8%  -No documented nor reported hypoglycemia.   Recent labs reviewed. - I have re-counseled the patient on diet management and weight loss  by adopting a carbohydrate restricted / protein rich  Diet.  -  Suggestion is made for him to avoid simple carbohydrates  from his diet including Cakes, Sweet Desserts / Pastries, Ice Cream, Soda (diet and regular), Sweet Tea, Candies, Chips, Cookies, Store Bought Juices, Alcohol in Excess of  1-2 drinks a day, Artificial Sweeteners, and "Sugar-free" Products. This will help patient to have stable blood glucose profile and potentially avoid unintended weight gain.   - Patient is advised to stick to a routine mealtimes to eat 3 meals  a day and avoid unnecessary snacks ( to snack only to correct hypoglycemia).  - I have approached patient with the following individualized plan to manage diabetes and patient agrees.   -He will need higher dose of insulin for him to achieve control of diabetes target.  -he has some  room on his basal insulin before considering prandial insulin.  - I advised him to increase Lantus to 70  units daily at bedtime  associated with monitoring of blood glucose 2 times a day- daily before breakfast and at bedtime.  I will continue  metformin ER 1000mg  po qday.  - Patient specific target  for A1c; LDL, HDL, Triglycerides, and  Waist Circumference were discussed in detail.  2) BP/HTN: His blood pressures not controlled to target, I advised him to continue his current blood pressure medications including HCTZ/ARB.  3) Lipids/HPL: His recent lipid panel showed controlled LDL at 57, uncontrolled triglyceride 246.  He is advised to continue  Crestor 5 mg p.o. nightly.   4)  Weight/Diet:   no success and weight loss, CDE consult in progress, exercise, and carbohydrates information provided.  5) Chronic Care/Health Maintenance:  -Patient is on ACEI/ARB and Statin medications and encouraged to continue to follow up with Ophthalmology, Podiatrist at least yearly or according to recommendations, and advised to  stay away from smoking. I have recommended yearly flu vaccine and pneumonia vaccination at least every 5  years; moderate intensity exercise for up to 150 minutes weekly; and  sleep for at least 7 hours a day.  - I advised patient to maintain close follow up with Dione Housekeeper, MD for primary care needs.  - Time spent with the patient: 25 min, of which >50% was spent in reviewing his blood glucose logs , discussing his hypo- and hyper-glycemic episodes, reviewing his current and  previous labs and insulin doses and developing a plan to avoid hypo- and hyper-glycemia. Please refer to Patient Instructions for Blood Glucose Monitoring and Insulin/Medications Dosing Guide"  in media tab for additional information. Tad L Grenda participated in the discussions, expressed understanding, and voiced agreement with the above plans.  All questions were answered to his satisfaction. he is encouraged to contact clinic should he have any questions or concerns prior to his return visit.  Follow up plan: -Return in about 3 months (around 01/22/2018) for meter, and logs.  Glade Lloyd, MD Phone: [phone]  Fax: [phone]  -  This note was partially dictated with voice recognition software. Similar sounding words can be transcribed inadequately or may not  be corrected upon review.  10/22/2017, 12:12 PM

## 2017-10-22 NOTE — Patient Instructions (Signed)

## 2017-12-14 ENCOUNTER — Other Ambulatory Visit: Payer: Self-pay

## 2017-12-14 MED ORDER — GLUCOSE BLOOD VI STRP: 1.0000 | ORAL_STRIP | 1 refills | Status: DC

## 2018-01-20 LAB — BASIC METABOLIC PANEL
BUN: 15 (ref 4–21)
Creatinine: 1.3 (ref 0.6–1.3)

## 2018-01-20 LAB — HEMOGLOBIN A1C: Hemoglobin A1C: 7.4

## 2018-01-22 ENCOUNTER — Encounter: Payer: Self-pay | Admitting: "Endocrinology

## 2018-02-02 ENCOUNTER — Ambulatory Visit: Payer: Medicare HMO | Admitting: "Endocrinology

## 2018-02-05 ENCOUNTER — Ambulatory Visit: Payer: Medicare HMO | Admitting: "Endocrinology

## 2018-02-10 ENCOUNTER — Other Ambulatory Visit: Payer: Self-pay | Admitting: "Endocrinology

## 2018-02-16 ENCOUNTER — Ambulatory Visit: Payer: Medicare HMO | Admitting: "Endocrinology

## 2018-02-16 ENCOUNTER — Encounter: Payer: Self-pay | Admitting: "Endocrinology

## 2018-02-16 VITALS — BP 149/80 | HR 63 | Ht 66.0 in | Wt 203.0 lb

## 2018-02-16 DIAGNOSIS — E1165 Type 2 diabetes mellitus with hyperglycemia: Secondary | ICD-10-CM

## 2018-02-16 DIAGNOSIS — E782 Mixed hyperlipidemia: Secondary | ICD-10-CM

## 2018-02-16 DIAGNOSIS — I10 Essential (primary) hypertension: Secondary | ICD-10-CM

## 2018-02-16 DIAGNOSIS — IMO0001 Reserved for inherently not codable concepts without codable children: Secondary | ICD-10-CM

## 2018-02-16 DIAGNOSIS — E6609 Other obesity due to excess calories: Secondary | ICD-10-CM | POA: Diagnosis not present

## 2018-02-16 DIAGNOSIS — Z6832 Body mass index (BMI) 32.0-32.9, adult: Secondary | ICD-10-CM

## 2018-02-16 MED ORDER — INSULIN GLARGINE 100 UNIT/ML SOLOSTAR PEN: 60.0000 [IU] | PEN_INJECTOR | Freq: Every day | SUBCUTANEOUS | 2 refills | Status: DC

## 2018-02-16 NOTE — Patient Instructions (Signed)

## 2018-02-16 NOTE — Progress Notes (Signed)
Endocrinology follow-up note   Subjective:    Patient ID: Patrick Moss, male    DOB: 1940/07/04, PCP Dione Housekeeper, MD   Past Medical History:  Diagnosis Date  . Diabetes mellitus without complication ([place])   . Hyperlipidemia   . Hypertension    Past Surgical History:  Procedure Laterality Date  . R foot fx     Social History   Socioeconomic History  . Marital status: Divorced    Spouse name: Not on file  . Number of children: Not on file  . Years of education: Not on file  . Highest education level: Not on file  Occupational History  . Not on file  Social Needs  . Financial resource strain: Not on file  . Food insecurity:    Worry: Not on file    Inability: Not on file  . Transportation needs:    Medical: Not on file    Non-medical: Not on file  Tobacco Use  . Smoking status: Never Smoker  . Smokeless tobacco: Never Used  Substance and Sexual Activity  . Alcohol use: No    Alcohol/week: 0.0 standard drinks    Frequency: Never  . Drug use: No  . Sexual activity: Not on file  Lifestyle  . Physical activity:    Days per week: Not on file    Minutes per session: Not on file  . Stress: Not on file  Relationships  . Social connections:    Talks on phone: Not on file    Gets together: Not on file    Attends religious service: Not on file    Active member of club or organization: Not on file    Attends meetings of clubs or organizations: Not on file    Relationship status: Not on file  Other Topics Concern  . Not on file  Social History Narrative  . Not on file   Outpatient Encounter Medications as of 02/16/2018  Medication Sig  . amLODipine (NORVASC) 5 MG tablet Take 5 mg by mouth daily.  Marland Kitchen aspirin 81 MG tablet Take 81 mg by mouth daily.  . B-D ULTRAFINE III SHORT PEN 31G X 8 MM MISC USE  1  PEN  NEEDLE AS DIRECTED  . glucose blood test strip 1 each by Other route as directed. Use as instructed 2-3 x daily. E11.65 Accu-chek nano  . Insulin Glargine  (LANTUS SOLOSTAR) 100 UNIT/ML Solostar Pen Inject 60 Units into the skin at bedtime.  Marland Kitchen losartan-hydrochlorothiazide (HYZAAR) 100-25 MG per tablet Take 1 tablet by mouth daily.  . metFORMIN (GLUCOPHAGE-XR) 500 MG 24 hr tablet Take 1,000 mg by mouth daily after breakfast.  . Omega-3 Fatty Acids (FISH OIL) 1000 MG CAPS Take 1,000 mg by mouth 2 (two) times daily.  . rosuvastatin (CRESTOR) 5 MG tablet Take 5 mg by mouth daily.  . [DISCONTINUED] Insulin Glargine (LANTUS SOLOSTAR) 100 UNIT/ML Solostar Pen Inject 70 Units into the skin at bedtime. (Patient taking differently: Inject 60 Units into the skin at bedtime. )   No facility-administered encounter medications on file as of 02/16/2018.    ALLERGIES: No Known Allergies VACCINATION STATUS:  There is no immunization history on file for this patient.  Diabetes  He presents for his follow-up diabetic visit. He has type 2 diabetes mellitus. Onset time: He was diagnosed at approximate age of 82 years. His disease course has been improving. There are no hypoglycemic associated symptoms. Pertinent negatives for hypoglycemia include no confusion, headaches, pallor or seizures. Associated symptoms  include polydipsia and polyuria. Pertinent negatives for diabetes include no chest pain, no fatigue, no polyphagia and no weakness. There are no hypoglycemic complications. Symptoms are improving. There are no diabetic complications. Risk factors for coronary artery disease include diabetes mellitus, dyslipidemia, male sex, obesity and sedentary lifestyle. He is compliant with treatment most of the time. His weight is decreasing steadily. He is following a generally unhealthy diet. He participates in exercise intermittently. His home blood glucose trend is increasing steadily. His breakfast blood glucose range is generally 140-180 mg/dl. His bedtime blood glucose range is generally 140-180 mg/dl. His overall blood glucose range is 140-180 mg/dl. An ACE  inhibitor/angiotensin II receptor blocker is being taken.  Hyperlipidemia  This is a chronic problem. The current episode started more than 1 year ago. The problem is uncontrolled. Exacerbating diseases include diabetes and obesity. Pertinent negatives include no chest pain, myalgias or shortness of breath. Current antihyperlipidemic treatment includes statins. Risk factors for coronary artery disease include dyslipidemia, hypertension, male sex, obesity and a sedentary lifestyle.  Hypertension  This is a chronic problem. The current episode started more than 1 year ago. The problem is uncontrolled. Pertinent negatives include no chest pain, headaches, neck pain, palpitations or shortness of breath. Risk factors for coronary artery disease include dyslipidemia, diabetes mellitus, obesity and sedentary lifestyle. Past treatments include ACE inhibitors and angiotensin blockers.     Review of Systems  Constitutional: Negative for fatigue and unexpected weight change.  HENT: Negative for dental problem, mouth sores and trouble swallowing.   Eyes: Negative for visual disturbance.  Respiratory: Negative for cough, choking, chest tightness, shortness of breath and wheezing.   Cardiovascular: Negative for chest pain, palpitations and leg swelling.  Gastrointestinal: Negative for abdominal distention, abdominal pain, constipation, diarrhea, nausea and vomiting.  Endocrine: Positive for polydipsia and polyuria. Negative for polyphagia.  Genitourinary: Negative for dysuria, flank pain, hematuria and urgency.  Musculoskeletal: Negative for back pain, gait problem, myalgias and neck pain.  Skin: Negative for pallor, rash and wound.  Neurological: Negative for seizures, syncope, weakness, numbness and headaches.  Psychiatric/Behavioral: Negative.  Negative for confusion and dysphoric mood.    Objective:    BP (!) 149/80   Pulse 63   Ht 5\' 6"  (1.676 m)   Wt 203 lb (92.1 kg)   BMI 32.77 kg/m   Wt  Readings from Last 3 Encounters:  02/16/18 203 lb (92.1 kg)  10/22/17 209 lb (94.8 kg)  07/20/17 207 lb (93.9 kg)    Physical Exam  Constitutional: He is oriented to person, place, and time. He appears well-developed and well-nourished. He is cooperative. No distress.  HENT:  Head: Normocephalic and atraumatic.  Eyes: EOM are normal.  Neck: Normal range of motion. Neck supple. No tracheal deviation present. No thyromegaly present.  Cardiovascular: Normal rate, S1 normal and S2 normal. Exam reveals no gallop.  No murmur heard. Pulses:      Dorsalis pedis pulses are 1+ on the right side, and 1+ on the left side.       Posterior tibial pulses are 1+ on the right side, and 1+ on the left side.  Pulmonary/Chest: Effort normal. No respiratory distress. He has no wheezes.  Abdominal: He exhibits no distension. There is no tenderness. There is no guarding and no CVA tenderness.  Musculoskeletal: He exhibits no edema.       Right shoulder: He exhibits no swelling and no deformity.  Neurological: He is alert and oriented to person, place, and time. He has  normal strength and normal reflexes. No cranial nerve deficit or sensory deficit. Gait normal.  Skin: Skin is warm and dry. No rash noted. No cyanosis. Nails show no clubbing.  Psychiatric: He has a normal mood and affect. His speech is normal. Judgment normal. Cognition and memory are normal.   Recent Results (from the past 2160 hour(s))  Basic metabolic panel     Status: None   Collection Time: 01/20/18 12:00 AM  Result Value Ref Range   BUN 15 4 - 21   Creatinine 1.3 0.6 - 1.3  Hemoglobin A1c     Status: None   Collection Time: 01/20/18 12:00 AM  Result Value Ref Range   Hemoglobin A1C 7.4     Assessment & Plan:   1. Uncontrolled type 2 diabetes mellitus without complication, without long-term current use of insulin ([facility])   Pt remains at a high risk for more acute and chronic complications of diabetes which include CAD, CVA, CKD,  retinopathy, and neuropathy. These are all discussed in detail with the patient.  -  He came with near target blood glucose profile both fasting and postprandial.  His A1c is 7.4% improving from 8.8%.    -No documented nor reported hypoglycemia.  -  Recent labs reviewed. - I have re-counseled the patient on diet management and weight loss  by adopting a carbohydrate restricted / protein rich  Diet.  -  Suggestion is made for him to avoid simple carbohydrates  from his diet including Cakes, Sweet Desserts / Pastries, Ice Cream, Soda (diet and regular), Sweet Tea, Candies, Chips, Cookies, Store Bought Juices, Alcohol in Excess of  1-2 drinks a day, Artificial Sweeteners, and "Sugar-free" Products. This will help patient to have stable blood glucose profile and potentially avoid unintended weight gain.  - Patient is advised to stick to a routine mealtimes to eat 3 meals  a day and avoid unnecessary snacks ( to snack only to correct hypoglycemia).  - I have approached patient with the following individualized plan to manage diabetes and patient agrees.   -Based on his presentation with near target glycemic profile, he will be continued on his current regimen. I advised him to continue Lantus 60 units nightly associated with monitoring of blood glucose 2 times a day-daily before breakfast and at bedtime. -I advised him to continue metformin 1000 mg ER p.o. daily after breakfast.    - Patient specific target  for A1c; LDL, HDL, Triglycerides, and  Waist Circumference were discussed in detail.  2) BP/HTN: His blood pressures not controlled to target.  He is advised to continue his current blood pressure medications including amlodipine 5 mg p.o. daily, Hyzaar 100-25 mg p.o. daily.   3) Lipids/HPL: His recent lipid panel showed controlled LDL at 57, uncontrolled triglyceride 246.  He is advised to continue Crestor 5 mg p.o. nightly.   4)  Weight/Diet:   no success and weight loss, CDE consult in  progress, exercise, and carbohydrates information provided.  5) Chronic Care/Health Maintenance:  -Patient is on ACEI/ARB and Statin medications and encouraged to continue to follow up with Ophthalmology, Podiatrist at least yearly or according to recommendations, and advised to  stay away from smoking. I have recommended yearly flu vaccine and pneumonia vaccination at least every 5 years; moderate intensity exercise for up to 150 minutes weekly; and  sleep for at least 7 hours a day.  - I advised patient to maintain close follow up with Dione Housekeeper, MD for primary care needs.  -  Time spent with the patient: 25 min, of which >50% was spent in reviewing his blood glucose logs , discussing his hypo- and hyper-glycemic episodes, reviewing his current and  previous labs and insulin doses and developing a plan to avoid hypo- and hyper-glycemia. Please refer to Patient Instructions for Blood Glucose Monitoring and Insulin/Medications Dosing Guide"  in media tab for additional information. Patrick Moss participated in the discussions, expressed understanding, and voiced agreement with the above plans.  All questions were answered to his satisfaction. he is encouraged to contact clinic should he have any questions or concerns prior to his return visit.   Follow up plan: -Return in about 4 months (around 06/18/2018) for Follow up with Pre-visit Labs, Meter, and Logs.  Glade Lloyd, MD Phone: [phone]  Fax: [phone]  -  This note was partially dictated with voice recognition software. Similar sounding words can be transcribed inadequately or may not  be corrected upon review.  02/16/2018, 9:50 AM

## 2018-05-19 LAB — LIPID PANEL
CHOLESTEROL: 130 (ref 0–200)
HDL: 38 (ref 35–70)
LDL Cholesterol: 54
Triglycerides: 190 — AB (ref 40–160)

## 2018-05-19 LAB — BASIC METABOLIC PANEL
BUN: 15 (ref 4–21)
CREATININE: 1.2 (ref 0.6–1.3)

## 2018-05-19 LAB — HEMOGLOBIN A1C: Hemoglobin A1C: 7.6

## 2018-06-01 ENCOUNTER — Ambulatory Visit: Payer: Medicare HMO | Admitting: "Endocrinology

## 2018-06-01 ENCOUNTER — Encounter: Payer: Self-pay | Admitting: "Endocrinology

## 2018-06-01 VITALS — BP 133/84 | HR 77 | Ht 66.0 in | Wt 205.0 lb

## 2018-06-01 DIAGNOSIS — I10 Essential (primary) hypertension: Secondary | ICD-10-CM | POA: Diagnosis not present

## 2018-06-01 DIAGNOSIS — IMO0001 Reserved for inherently not codable concepts without codable children: Secondary | ICD-10-CM

## 2018-06-01 DIAGNOSIS — E782 Mixed hyperlipidemia: Secondary | ICD-10-CM | POA: Diagnosis not present

## 2018-06-01 DIAGNOSIS — E1165 Type 2 diabetes mellitus with hyperglycemia: Secondary | ICD-10-CM | POA: Diagnosis not present

## 2018-06-01 NOTE — Progress Notes (Signed)
Endocrinology follow-up note   Subjective:    Patient ID: Patrick Moss, male    DOB: 01-04-1941, PCP Dione Housekeeper, MD   Past Medical History:  Diagnosis Date  . Diabetes mellitus without complication ([place])   . Hyperlipidemia   . Hypertension    Past Surgical History:  Procedure Laterality Date  . R foot fx     Social History   Socioeconomic History  . Marital status: Divorced    Spouse name: Not on file  . Number of children: Not on file  . Years of education: Not on file  . Highest education level: Not on file  Occupational History  . Not on file  Social Needs  . Financial resource strain: Not on file  . Food insecurity:    Worry: Not on file    Inability: Not on file  . Transportation needs:    Medical: Not on file    Non-medical: Not on file  Tobacco Use  . Smoking status: Never Smoker  . Smokeless tobacco: Never Used  Substance and Sexual Activity  . Alcohol use: No    Alcohol/week: 0.0 standard drinks    Frequency: Never  . Drug use: No  . Sexual activity: Not on file  Lifestyle  . Physical activity:    Days per week: Not on file    Minutes per session: Not on file  . Stress: Not on file  Relationships  . Social connections:    Talks on phone: Not on file    Gets together: Not on file    Attends religious service: Not on file    Active member of club or organization: Not on file    Attends meetings of clubs or organizations: Not on file    Relationship status: Not on file  Other Topics Concern  . Not on file  Social History Narrative  . Not on file   Outpatient Encounter Medications as of 06/01/2018  Medication Sig  . amLODipine (NORVASC) 5 MG tablet Take 5 mg by mouth daily.  Marland Kitchen aspirin 81 MG tablet Take 81 mg by mouth daily.  . B-D ULTRAFINE III SHORT PEN 31G X 8 MM MISC USE  1  PEN  NEEDLE AS DIRECTED  . glucose blood test strip 1 each by Other route as directed. Use as instructed 2-3 x daily. E11.65 Accu-chek nano  . Insulin Glargine  (LANTUS SOLOSTAR) 100 UNIT/ML Solostar Pen Inject 60 Units into the skin at bedtime.  Marland Kitchen losartan-hydrochlorothiazide (HYZAAR) 100-25 MG per tablet Take 1 tablet by mouth daily.  . metFORMIN (GLUCOPHAGE-XR) 500 MG 24 hr tablet Take 1,000 mg by mouth daily after breakfast.  . Omega-3 Fatty Acids (FISH OIL) 1000 MG CAPS Take 1,000 mg by mouth 2 (two) times daily.  . rosuvastatin (CRESTOR) 5 MG tablet Take 5 mg by mouth daily.   No facility-administered encounter medications on file as of 06/01/2018.    ALLERGIES: No Known Allergies VACCINATION STATUS:  There is no immunization history on file for this patient.  Diabetes  He presents for his follow-up diabetic visit. He has type 2 diabetes mellitus. Onset time: He was diagnosed at approximate age of 21 years. His disease course has been worsening. There are no hypoglycemic associated symptoms. Pertinent negatives for hypoglycemia include no confusion, headaches, pallor or seizures. Associated symptoms include polydipsia and polyuria. Pertinent negatives for diabetes include no chest pain, no fatigue, no polyphagia and no weakness. There are no hypoglycemic complications. Symptoms are worsening. There are no diabetic  complications. Risk factors for coronary artery disease include diabetes mellitus, dyslipidemia, male sex, obesity and sedentary lifestyle. He is compliant with treatment most of the time. His weight is increasing steadily. He is following a generally unhealthy diet. He participates in exercise intermittently. His home blood glucose trend is increasing steadily. His breakfast blood glucose range is generally 140-180 mg/dl. His bedtime blood glucose range is generally 140-180 mg/dl. His overall blood glucose range is 140-180 mg/dl. An ACE inhibitor/angiotensin II receptor blocker is being taken.  Hyperlipidemia  This is a chronic problem. The current episode started more than 1 year ago. The problem is uncontrolled. Exacerbating diseases  include diabetes and obesity. Pertinent negatives include no chest pain, myalgias or shortness of breath. Current antihyperlipidemic treatment includes statins. Risk factors for coronary artery disease include dyslipidemia, hypertension, male sex, obesity and a sedentary lifestyle.  Hypertension  This is a chronic problem. The current episode started more than 1 year ago. The problem is uncontrolled. Pertinent negatives include no chest pain, headaches, neck pain, palpitations or shortness of breath. Risk factors for coronary artery disease include dyslipidemia, diabetes mellitus, obesity and sedentary lifestyle. Past treatments include ACE inhibitors and angiotensin blockers.    Review of Systems  Constitutional: Negative for fatigue and unexpected weight change.  HENT: Negative for dental problem, mouth sores and trouble swallowing.   Eyes: Negative for visual disturbance.  Respiratory: Negative for cough, choking, chest tightness, shortness of breath and wheezing.   Cardiovascular: Negative for chest pain, palpitations and leg swelling.  Gastrointestinal: Negative for abdominal distention, abdominal pain, constipation, diarrhea, nausea and vomiting.  Endocrine: Positive for polydipsia and polyuria. Negative for polyphagia.  Genitourinary: Negative for dysuria, flank pain, hematuria and urgency.  Musculoskeletal: Negative for back pain, gait problem, myalgias and neck pain.  Skin: Negative for pallor, rash and wound.  Neurological: Negative for seizures, syncope, weakness, numbness and headaches.  Psychiatric/Behavioral: Negative.  Negative for confusion and dysphoric mood.    Objective:    BP 133/84   Pulse 77   Ht 5\' 6"  (1.676 m)   Wt 205 lb (93 kg)   BMI 33.09 kg/m   Wt Readings from Last 3 Encounters:  06/01/18 205 lb (93 kg)  02/16/18 203 lb (92.1 kg)  10/22/17 209 lb (94.8 kg)    Physical Exam  Constitutional: He is oriented to person, place, and time. He appears  well-developed and well-nourished. He is cooperative. No distress.  HENT:  Head: Normocephalic and atraumatic.  Eyes: EOM are normal.  Neck: Normal range of motion. Neck supple. No tracheal deviation present. No thyromegaly present.  Cardiovascular: Normal rate, S1 normal and S2 normal. Exam reveals no gallop.  No murmur heard. Pulses:      Dorsalis pedis pulses are 1+ on the right side and 1+ on the left side.       Posterior tibial pulses are 1+ on the right side and 1+ on the left side.  Pulmonary/Chest: Effort normal. No respiratory distress. He has no wheezes.  Abdominal: He exhibits no distension. There is no abdominal tenderness. There is no guarding and no CVA tenderness.  Musculoskeletal:        General: No edema.     Right shoulder: He exhibits no swelling and no deformity.  Neurological: He is alert and oriented to person, place, and time. He has normal strength and normal reflexes. No cranial nerve deficit or sensory deficit. Gait normal.  Skin: Skin is warm and dry. No rash noted. No cyanosis. Nails show  no clubbing.  Psychiatric: He has a normal mood and affect. His speech is normal. Judgment normal. Cognition and memory are normal.   Recent Results (from the past 2160 hour(s))  Basic metabolic panel     Status: None   Collection Time: 05/19/18 12:00 AM  Result Value Ref Range   BUN 15 4 - 21   Creatinine 1.2 0.6 - 1.3  Lipid panel     Status: Abnormal   Collection Time: 05/19/18 12:00 AM  Result Value Ref Range   Triglycerides 190 (A) 40 - 160   Cholesterol 130 0 - 200   HDL 38 35 - 70   LDL Cholesterol 54   Hemoglobin A1c     Status: None   Collection Time: 05/19/18 12:00 AM  Result Value Ref Range   Hemoglobin A1C 7.6     Assessment & Plan:   1. Uncontrolled type 2 diabetes mellitus without complication, without long-term current use of insulin (HCC)   Pt remains at a high risk for more acute and chronic complications of diabetes which include CAD, CVA, CKD,  retinopathy, and neuropathy. These are all discussed in detail with the patient.  -  He came with increased A1c of 7.6% from  7.4%  After improving from 8.8%.    -No documented nor reported hypoglycemia.  -  Recent labs reviewed. - I have re-counseled the patient on diet management and weight loss  by adopting a carbohydrate restricted / protein rich  Diet.  Admits to diet indiscretions including consumption of sweetened beverages. -  Suggestion is made for him to avoid simple carbohydrates  from his diet including Cakes, Sweet Desserts / Pastries, Ice Cream, Soda (diet and regular), Sweet Tea, Candies, Chips, Cookies, Store Bought Juices, Alcohol in Excess of  1-2 drinks a day, Artificial Sweeteners, and "Sugar-free" Products. This will help patient to have stable blood glucose profile and potentially avoid unintended weight gain.  - Patient is advised to stick to a routine mealtimes to eat 3 meals  a day and avoid unnecessary snacks ( to snack only to correct hypoglycemia).  - I have approached patient with the following individualized plan to manage diabetes and patient agrees.   -He will not require prandial insulin for now.  He is fasting glycemic profile is near target, advised to continue Lantus 60 units nightly associated with strict monitoring of blood glucose 2 times a day-daily before breakfast and at bedtime.  He is advised to continue metformin 1000 mg ER p.o. daily after breakfast.  - Patient specific target  for A1c; LDL, HDL, Triglycerides, and  Waist Circumference were discussed in detail.  2) BP/HTN: His blood pressures not controlled to target.   He is advised to continue his current blood pressure medications including amlodipine 5 mg p.o. daily, Hyzaar 100-25 mg p.o. daily.   3) Lipids/HPL: His recent lipid panel showed controlled LDL at 57, uncontrolled triglyceride 246.  He is advised to continue Crestor 5 mg p.o. nightly.    4)  Weight/Diet:   no success and weight  loss, CDE consult in progress, exercise, and carbohydrates information provided.  5) Chronic Care/Health Maintenance:  -Patient is on ACEI/ARB and Statin medications and encouraged to continue to follow up with Ophthalmology, Podiatrist at least yearly or according to recommendations, and advised to  stay away from smoking. I have recommended yearly flu vaccine and pneumonia vaccination at least every 5 years; moderate intensity exercise for up to 150 minutes weekly; and  sleep for at  least 7 hours a day.  - I advised patient to maintain close follow up with Dione Housekeeper, MD for primary care needs.  - Time spent with the patient: 25 min, of which >50% was spent in reviewing his blood glucose logs , discussing his hypo- and hyper-glycemic episodes, reviewing his current and  previous labs and insulin doses and developing a plan to avoid hypo- and hyper-glycemia. Please refer to Patient Instructions for Blood Glucose Monitoring and Insulin/Medications Dosing Guide"  in media tab for additional information. Macsen L Bouyer participated in the discussions, expressed understanding, and voiced agreement with the above plans.  All questions were answered to his satisfaction. he is encouraged to contact clinic should he have any questions or concerns prior to his return visit.   Follow up plan: -Return in about 4 months (around 10/01/2018) for Meter, and Logs.  Glade Lloyd, MD Phone: [phone]  Fax: [phone]  -  This note was partially dictated with voice recognition software. Similar sounding words can be transcribed inadequately or may not  be corrected upon review.  06/01/2018, 1:24 PM

## 2018-06-01 NOTE — Patient Instructions (Signed)

## 2018-06-02 ENCOUNTER — Ambulatory Visit: Payer: Medicare HMO | Admitting: "Endocrinology

## 2018-06-29 ENCOUNTER — Other Ambulatory Visit: Payer: Self-pay | Admitting: "Endocrinology

## 2018-07-23 ENCOUNTER — Other Ambulatory Visit: Payer: Self-pay | Admitting: "Endocrinology

## 2018-09-10 DIAGNOSIS — R066 Hiccough: Secondary | ICD-10-CM | POA: Insufficient documentation

## 2018-10-04 ENCOUNTER — Ambulatory Visit: Payer: Medicare HMO | Admitting: "Endocrinology

## 2018-10-07 ENCOUNTER — Ambulatory Visit: Payer: Medicare HMO | Admitting: "Endocrinology

## 2018-10-25 ENCOUNTER — Other Ambulatory Visit: Payer: Self-pay | Admitting: "Endocrinology

## 2019-01-07 ENCOUNTER — Ambulatory Visit: Payer: Medicare HMO | Admitting: Urology

## 2019-01-26 DIAGNOSIS — H5210 Myopia, unspecified eye: Secondary | ICD-10-CM | POA: Diagnosis not present

## 2019-01-26 DIAGNOSIS — I10 Essential (primary) hypertension: Secondary | ICD-10-CM | POA: Diagnosis not present

## 2019-01-26 DIAGNOSIS — E7800 Pure hypercholesterolemia, unspecified: Secondary | ICD-10-CM | POA: Diagnosis not present

## 2019-01-26 DIAGNOSIS — Z961 Presence of intraocular lens: Secondary | ICD-10-CM | POA: Diagnosis not present

## 2019-01-26 DIAGNOSIS — H40023 Open angle with borderline findings, high risk, bilateral: Secondary | ICD-10-CM | POA: Diagnosis not present

## 2019-01-26 DIAGNOSIS — Z0100 Encounter for examination of eyes and vision without abnormal findings: Secondary | ICD-10-CM | POA: Diagnosis not present

## 2019-02-01 ENCOUNTER — Ambulatory Visit: Payer: Medicare HMO | Admitting: Family Medicine

## 2019-02-08 ENCOUNTER — Ambulatory Visit (INDEPENDENT_AMBULATORY_CARE_PROVIDER_SITE_OTHER): Payer: Medicare HMO | Admitting: Urology

## 2019-02-08 DIAGNOSIS — R972 Elevated prostate specific antigen [PSA]: Secondary | ICD-10-CM

## 2019-02-08 DIAGNOSIS — N400 Enlarged prostate without lower urinary tract symptoms: Secondary | ICD-10-CM

## 2019-02-09 DIAGNOSIS — H40023 Open angle with borderline findings, high risk, bilateral: Secondary | ICD-10-CM | POA: Diagnosis not present

## 2019-02-09 DIAGNOSIS — Z961 Presence of intraocular lens: Secondary | ICD-10-CM | POA: Diagnosis not present

## 2019-02-09 DIAGNOSIS — Z794 Long term (current) use of insulin: Secondary | ICD-10-CM | POA: Diagnosis not present

## 2019-02-09 DIAGNOSIS — E119 Type 2 diabetes mellitus without complications: Secondary | ICD-10-CM | POA: Diagnosis not present

## 2019-03-07 ENCOUNTER — Other Ambulatory Visit: Payer: Self-pay

## 2019-03-07 ENCOUNTER — Ambulatory Visit (INDEPENDENT_AMBULATORY_CARE_PROVIDER_SITE_OTHER): Payer: Medicare HMO | Admitting: Family Medicine

## 2019-03-07 ENCOUNTER — Encounter: Payer: Self-pay | Admitting: Family Medicine

## 2019-03-07 DIAGNOSIS — L240 Irritant contact dermatitis due to detergents: Secondary | ICD-10-CM | POA: Diagnosis not present

## 2019-03-07 MED ORDER — TRIAMCINOLONE ACETONIDE 0.1 % EX CREA: 1.0000 "application " | TOPICAL_CREAM | Freq: Three times a day (TID) | CUTANEOUS | 0 refills | Status: DC

## 2019-03-07 NOTE — Progress Notes (Signed)
    Subjective:    Patient ID: Patrick Moss, male    DOB: [birth year], 78 y.o.   MRN: [id number]   HPI: Patrick Moss is a 78 y.o. male presenting for rash. It was caused by a laundry detergent at underwear, on thigh ligh. Also at toes wear socks fit. Red bumps. Onset 3 days ago. Had tried a new laundry detergent additive. Putting a cream (clotrimazole and betamethasone) on it that Dr. Edrick Oh had given him in the past. Also using benadryl q 6 hours.   Depression screen [facility] 2/9 10/22/2017 04/21/2017 10/14/2016 07/14/2016 04/01/2016  Decreased Interest 0 0 0 - 0  Down, Depressed, Hopeless 0 0 0 0 0  PHQ - 2 Score 0 0 0 0 0     Relevant past medical, surgical, family and social history reviewed and updated as indicated.  Interim medical history since our last visit reviewed. Allergies and medications reviewed and updated.  ROS:  Review of Systems  Respiratory: Negative for shortness of breath.   Skin: Positive for rash (pruritic).     Social History   Tobacco Use  Smoking Status Never Smoker  Smokeless Tobacco Never Used       Objective:     Wt Readings from Last 3 Encounters:  06/01/18 205 lb (93 kg)  02/16/18 203 lb (92.1 kg)  10/22/17 209 lb (94.8 kg)     Exam deferred. Pt. Harboring due to COVID 19. Phone visit performed.   Assessment & Plan:   1. Irritant contact dermatitis due to detergent     Meds ordered this encounter  Medications  . triamcinolone cream (KENALOG) 0.1 %    Sig: Apply 1 application topically 3 (three) times daily. Avoid face and genitalia    Dispense:  45 g    Refill:  0    No orders of the defined types were placed in this encounter.     Diagnoses and all orders for this visit:  Irritant contact dermatitis due to detergent  Other orders -     triamcinolone cream (KENALOG) 0.1 %; Apply 1 application topically 3 (three) times daily. Avoid face and genitalia    Virtual Visit via telephone Note  I discussed the limitations,  risks, security and privacy concerns of performing an evaluation and management service by telephone and the availability of in person appointments. The patient was identified with two identifiers. Pt.expressed understanding and agreed to proceed. Pt. Is at home. Dr. Livia Snellen is in his office.  Follow Up Instructions:   I discussed the assessment and treatment plan with the patient. The patient was provided an opportunity to ask questions and all were answered. The patient agreed with the plan and demonstrated an understanding of the instructions.   The patient was advised to call back or seek an in-person evaluation if the symptoms worsen or if the condition fails to improve as anticipated.   Total minutes including chart review and phone contact time: 8   Follow up plan: Return in about 22 days (around 03/29/2019) for initial visit, as planned previously.  [name], MD [facility]

## 2019-03-10 ENCOUNTER — Telehealth: Payer: Self-pay | Admitting: Family Medicine

## 2019-03-10 ENCOUNTER — Other Ambulatory Visit: Payer: Self-pay | Admitting: Family Medicine

## 2019-03-10 MED ORDER — TRIAMCINOLONE ACETONIDE 0.1 % EX CREA: 1.0000 "application " | TOPICAL_CREAM | Freq: Three times a day (TID) | CUTANEOUS | 0 refills | Status: DC

## 2019-03-10 NOTE — Telephone Encounter (Signed)
I sent in the requested prescription 

## 2019-03-10 NOTE — Telephone Encounter (Signed)
What is the name of the medication? Kenalog 0.1 % Needs more called in. Has enough for today  Have you contacted your pharmacy to request a refill? NO  Which pharmacy would you like this sent to? [facility] in [place]  Patient notified that their request is being sent to the clinical staff for review and that they should receive a call once it is complete. If they do not receive a call within 24 hours they can check with their pharmacy or our office.

## 2019-03-10 NOTE — Telephone Encounter (Signed)
Patient will pay out of pocket

## 2019-03-10 NOTE — Telephone Encounter (Signed)
lmtcb

## 2019-03-16 DIAGNOSIS — Z961 Presence of intraocular lens: Secondary | ICD-10-CM | POA: Diagnosis not present

## 2019-03-16 DIAGNOSIS — H40023 Open angle with borderline findings, high risk, bilateral: Secondary | ICD-10-CM | POA: Diagnosis not present

## 2019-03-28 ENCOUNTER — Other Ambulatory Visit: Payer: Self-pay

## 2019-03-29 ENCOUNTER — Other Ambulatory Visit: Payer: Self-pay

## 2019-03-29 ENCOUNTER — Ambulatory Visit (INDEPENDENT_AMBULATORY_CARE_PROVIDER_SITE_OTHER): Payer: Medicare HMO | Admitting: Family Medicine

## 2019-03-29 ENCOUNTER — Encounter: Payer: Self-pay | Admitting: Family Medicine

## 2019-03-29 VITALS — BP 136/69 | HR 73 | Temp 98.6°F | Ht 66.0 in | Wt 202.8 lb

## 2019-03-29 DIAGNOSIS — Z794 Long term (current) use of insulin: Secondary | ICD-10-CM

## 2019-03-29 DIAGNOSIS — E1159 Type 2 diabetes mellitus with other circulatory complications: Secondary | ICD-10-CM | POA: Diagnosis not present

## 2019-03-29 DIAGNOSIS — Z6832 Body mass index (BMI) 32.0-32.9, adult: Secondary | ICD-10-CM | POA: Diagnosis not present

## 2019-03-29 DIAGNOSIS — N401 Enlarged prostate with lower urinary tract symptoms: Secondary | ICD-10-CM

## 2019-03-29 DIAGNOSIS — E119 Type 2 diabetes mellitus without complications: Secondary | ICD-10-CM | POA: Diagnosis not present

## 2019-03-29 DIAGNOSIS — E6609 Other obesity due to excess calories: Secondary | ICD-10-CM

## 2019-03-29 DIAGNOSIS — M519 Unspecified thoracic, thoracolumbar and lumbosacral intervertebral disc disorder: Secondary | ICD-10-CM | POA: Diagnosis not present

## 2019-03-29 DIAGNOSIS — R3912 Poor urinary stream: Secondary | ICD-10-CM | POA: Diagnosis not present

## 2019-03-29 DIAGNOSIS — I10 Essential (primary) hypertension: Secondary | ICD-10-CM

## 2019-03-29 DIAGNOSIS — E782 Mixed hyperlipidemia: Secondary | ICD-10-CM

## 2019-03-29 DIAGNOSIS — I152 Hypertension secondary to endocrine disorders: Secondary | ICD-10-CM

## 2019-03-29 LAB — URINALYSIS
Bilirubin, UA: NEGATIVE
Glucose, UA: NEGATIVE
Ketones, UA: NEGATIVE
Leukocytes,UA: NEGATIVE
Nitrite, UA: NEGATIVE
Protein,UA: NEGATIVE
RBC, UA: NEGATIVE
Specific Gravity, UA: 1.025 (ref 1.005–1.030)
Urobilinogen, Ur: 0.2 mg/dL (ref 0.2–1.0)
pH, UA: 5.5 (ref 5.0–7.5)

## 2019-03-29 LAB — BAYER DCA HB A1C WAIVED: HB A1C (BAYER DCA - WAIVED): 7.4 % — ABNORMAL HIGH (ref <7.0)

## 2019-03-29 NOTE — Progress Notes (Signed)
Subjective:  Patient ID: Patrick Moss, male    DOB: [birth year]  Age: 78 y.o. MRN: [id number]  CC: New Patient (Initial Visit) ([place])   HPI [place] presents for follow-up of diabetes. Patient does not check blood sugar at home Patient denies symptoms such as polyuria, polydipsia, excessive hunger, nausea No significant hypoglycemic spells noted. Medications as noted below. Taking them regularly without complication/adverse reaction being reported today.   Follow-up of hypertension. Patient has no history of headache chest pain or shortness of breath or recent cough. Patient also denies symptoms of TIA such as numbness weakness lateralizing. Patient checks  blood pressure at home and has not had any elevated readings recently. Patient denies side effects from his medication. States taking it regularly.   Patient in for follow-up of elevated cholesterol. Doing well without complaints on current medication. Denies side effects of statin including myalgia and arthralgia and nausea. Also in today for liver function testing. Currently no chest pain, shortness of breath or other cardiovascular related symptoms noted.  Patient's only other concern is that he has 2 bulging disks in his back.  They do not cause him pain routinely.  However, they do limit his ability to do strenuous activities.  Lastly he says his urine flow is rather slow.  He saw his urologist 2 weeks ago.  He is being monitored for BPH.  Depression screen [facility] 2/9 03/29/2019 10/22/2017 04/21/2017  Decreased Interest 0 0 0  Down, Depressed, Hopeless 0 0 0  PHQ - 2 Score 0 0 0    History Dy[place] has a past medical history of Diabetes mellitus without complication ([place]), Hyperlipidemia, and Hypertension.   He has a past surgical history that includes R foot fx.   His family history includes Diabetes in his brother, father, and mother.He reports that he has never smoked. He has never used smokeless tobacco. He reports that  he does not drink alcohol or use drugs.    ROS Review of Systems  Constitutional: Negative.   HENT: Negative.   Eyes: Negative for visual disturbance.  Respiratory: Negative for cough and shortness of breath.   Cardiovascular: Negative for chest pain and leg swelling.  Gastrointestinal: Negative for abdominal pain, diarrhea, nausea and vomiting.  Genitourinary: Positive for difficulty urinating (hesitancy).  Musculoskeletal: Positive for arthralgias and back pain. Negative for myalgias.  Skin: Negative for rash.  Neurological: Negative for headaches.  Psychiatric/Behavioral: Negative for sleep disturbance.    Objective:  BP 136/69   Pulse 73   Temp 98.6 F (37 C) (Temporal)   Ht 5' 6"  (1.676 m)   Wt 202 lb 12.8 oz (92 kg)   SpO2 97%   BMI 32.73 kg/m   BP Readings from Last 3 Encounters:  03/29/19 136/69  06/01/18 133/84  02/16/18 (!) 149/80    Wt Readings from Last 3 Encounters:  03/29/19 202 lb 12.8 oz (92 kg)  06/01/18 205 lb (93 kg)  02/16/18 203 lb (92.1 kg)     Physical Exam Constitutional:      General: He is not in acute distress.    Appearance: He is well-developed.  HENT:     Head: Normocephalic and atraumatic.     Right Ear: External ear normal.     Left Ear: External ear normal.     Nose: Nose normal.  Eyes:     Conjunctiva/sclera: Conjunctivae normal.     Pupils: Pupils are equal, round, and reactive to light.  Neck:     Musculoskeletal:  Normal range of motion and neck supple.  Cardiovascular:     Rate and Rhythm: Normal rate and regular rhythm.     Heart sounds: Normal heart sounds. No murmur.  Pulmonary:     Effort: Pulmonary effort is normal. No respiratory distress.     Breath sounds: Normal breath sounds. No wheezing or rales.  Abdominal:     Palpations: Abdomen is soft.     Tenderness: There is no abdominal tenderness.  Musculoskeletal: Normal range of motion.  Skin:    General: Skin is warm and dry.  Neurological:     Mental  Status: He is alert and oriented to person, place, and time.     Deep Tendon Reflexes: Reflexes are normal and symmetric.  Psychiatric:        Behavior: Behavior normal.        Thought Content: Thought content normal.        Judgment: Judgment normal.       Assessment & Plan:   Avien was seen today for new patient (initial visit).  Diagnoses and all orders for this visit:  Hypertension associated with diabetes ([place]) -     CBC with Differential/Platelet -     CMP14+EGFR -     Urinalysis  Insulin dependent type 2 diabetes mellitus (HCC) -     CBC with Differential/Platelet -     CMP14+EGFR -     Lipid panel -     PSA Total (Reflex To Free) -     Bayer DCA Hb A1c Waived -     Urinalysis  Essential hypertension, benign -     CBC with Differential/Platelet -     CMP14+EGFR  Mixed hyperlipidemia -     CBC with Differential/Platelet -     CMP14+EGFR -     Lipid panel  Class 1 obesity due to excess calories without serious comorbidity with body mass index (BMI) of 32.0 to 32.9 in adult -     CBC with Differential/Platelet -     CMP14+EGFR  Benign prostatic hyperplasia with weak urinary stream -     CBC with Differential/Platelet -     CMP14+EGFR -     PSA Total (Reflex To Free) -     Urinalysis  Lumbar disc disease -     CBC with Differential/Platelet -     CMP14+EGFR       I have discontinued Amdrew L. Centanni's Fish Oil and triamcinolone cream. I am also having him maintain his aspirin, amLODipine, rosuvastatin, losartan-hydrochlorothiazide, metFORMIN, B-D ULTRAFINE III SHORT PEN, Accu-Chek SmartView, and Lantus SoloStar.  Allergies as of 03/29/2019      Reactions   Sulfamethoxazole-trimethoprim Hives      Medication List       Accurate as of March 29, 2019  1:54 PM. If you have any questions, ask your nurse or doctor.        STOP taking these medications   Fish Oil 1000 MG Caps Stopped by: [name], MD   triamcinolone cream 0.1 % Commonly  known as: KENALOG Stopped by: [name], MD     TAKE these medications   Accu-Chek SmartView test strip Generic drug: glucose blood TEST BLOOD SUGAR TWO TO THREE TIMES DAILY AS DIRECTED   amLODipine 5 MG tablet Commonly known as: NORVASC Take 5 mg by mouth daily.   aspirin 81 MG tablet Take 81 mg by mouth daily.   B-D ULTRAFINE III SHORT PEN 31G X 8 MM Misc Generic drug: Insulin Pen  Needle USE  1  PEN  NEEDLE AS DIRECTED   Lantus SoloStar 100 UNIT/ML Solostar Pen Generic drug: Insulin Glargine INJECT 60 UNITS INTO THE SKIN AT BEDTIME (DISCARD USED PEN AFTER 28 DAYS)   losartan-hydrochlorothiazide 100-25 MG tablet Commonly known as: HYZAAR Take 1 tablet by mouth daily.   metFORMIN 500 MG 24 hr tablet Commonly known as: GLUCOPHAGE-XR Take 1,000 mg by mouth daily after breakfast.   rosuvastatin 5 MG tablet Commonly known as: CRESTOR Take 5 mg by mouth daily.        Follow-up: Return in about 6 months (around 09/27/2019).  [name], M.D.

## 2019-03-30 LAB — CBC WITH DIFFERENTIAL/PLATELET
Basophils Absolute: 0 10*3/uL (ref 0.0–0.2)
Basos: 0 %
EOS (ABSOLUTE): 0.1 10*3/uL (ref 0.0–0.4)
Eos: 1 %
Hematocrit: 45 % (ref 37.5–51.0)
Hemoglobin: 15.5 g/dL (ref 13.0–17.7)
Immature Grans (Abs): 0 10*3/uL (ref 0.0–0.1)
Immature Granulocytes: 0 %
Lymphocytes Absolute: 2.9 10*3/uL (ref 0.7–3.1)
Lymphs: 37 %
MCH: 30.9 pg (ref 26.6–33.0)
MCHC: 34.4 g/dL (ref 31.5–35.7)
MCV: 90 fL (ref 79–97)
Monocytes Absolute: 0.5 10*3/uL (ref 0.1–0.9)
Monocytes: 6 %
Neutrophils Absolute: 4.4 10*3/uL (ref 1.4–7.0)
Neutrophils: 56 %
Platelets: 227 10*3/uL (ref 150–450)
RBC: 5.01 x10E6/uL (ref 4.14–5.80)
RDW: 12.9 % (ref 11.6–15.4)
WBC: 7.9 10*3/uL (ref 3.4–10.8)

## 2019-03-30 LAB — CMP14+EGFR
ALT: 27 IU/L (ref 0–44)
AST: 18 IU/L (ref 0–40)
Albumin/Globulin Ratio: 1.8 (ref 1.2–2.2)
Albumin: 4.6 g/dL (ref 3.7–4.7)
Alkaline Phosphatase: 90 IU/L (ref 39–117)
BUN/Creatinine Ratio: 12 (ref 10–24)
BUN: 15 mg/dL (ref 8–27)
Bilirubin Total: 0.6 mg/dL (ref 0.0–1.2)
CO2: 24 mmol/L (ref 20–29)
Calcium: 10.1 mg/dL (ref 8.6–10.2)
Chloride: 102 mmol/L (ref 96–106)
Creatinine, Ser: 1.28 mg/dL — ABNORMAL HIGH (ref 0.76–1.27)
GFR calc Af Amer: 62 mL/min/{1.73_m2} (ref >59)
GFR calc non Af Amer: 54 mL/min/{1.73_m2} — ABNORMAL LOW (ref >59)
Globulin, Total: 2.5 g/dL (ref 1.5–4.5)
Glucose: 121 mg/dL — ABNORMAL HIGH (ref 65–99)
Potassium: 4.3 mmol/L (ref 3.5–5.2)
Sodium: 141 mmol/L (ref 134–144)
Total Protein: 7.1 g/dL (ref 6.0–8.5)

## 2019-03-30 LAB — FPSA% REFLEX
% FREE PSA: 18.6 %
PSA, FREE: 1.49 ng/mL

## 2019-03-30 LAB — LIPID PANEL
Chol/HDL Ratio: 3.5 ratio (ref 0.0–5.0)
Cholesterol, Total: 132 mg/dL (ref 100–199)
HDL: 38 mg/dL — ABNORMAL LOW (ref >39)
LDL Chol Calc (NIH): 66 mg/dL (ref 0–99)
Triglycerides: 166 mg/dL — ABNORMAL HIGH (ref 0–149)
VLDL Cholesterol Cal: 28 mg/dL (ref 5–40)

## 2019-03-30 LAB — PSA TOTAL (REFLEX TO FREE): Prostate Specific Ag, Serum: 8 ng/mL — ABNORMAL HIGH (ref 0.0–4.0)

## 2019-03-31 ENCOUNTER — Telehealth: Payer: Self-pay | Admitting: Family Medicine

## 2019-03-31 NOTE — Telephone Encounter (Signed)
Sent through [employer].

## 2019-04-04 ENCOUNTER — Telehealth: Payer: Self-pay | Admitting: Family Medicine

## 2019-04-04 ENCOUNTER — Other Ambulatory Visit: Payer: Self-pay | Admitting: Family Medicine

## 2019-04-04 MED ORDER — HALOBETASOL PROPIONATE 0.05 % EX CREA: TOPICAL_CREAM | Freq: Two times a day (BID) | CUTANEOUS | 0 refills | Status: DC

## 2019-04-04 NOTE — Telephone Encounter (Signed)
I sent in the requested prescription 

## 2019-04-04 NOTE — Telephone Encounter (Signed)
Please advise 

## 2019-04-04 NOTE — Telephone Encounter (Signed)
Patient notified and verbalized understanding. 

## 2019-04-06 ENCOUNTER — Telehealth: Payer: Self-pay | Admitting: "Endocrinology

## 2019-04-06 NOTE — Telephone Encounter (Signed)
Can you update lab order and hes going to [place] so I am sure its needing to be [facility]

## 2019-04-06 NOTE — Telephone Encounter (Signed)
Pt notified that we have labs

## 2019-04-07 ENCOUNTER — Other Ambulatory Visit: Payer: Self-pay

## 2019-04-07 ENCOUNTER — Ambulatory Visit (INDEPENDENT_AMBULATORY_CARE_PROVIDER_SITE_OTHER): Payer: Medicare HMO | Admitting: "Endocrinology

## 2019-04-07 ENCOUNTER — Encounter: Payer: Self-pay | Admitting: "Endocrinology

## 2019-04-07 DIAGNOSIS — E782 Mixed hyperlipidemia: Secondary | ICD-10-CM

## 2019-04-07 DIAGNOSIS — I10 Essential (primary) hypertension: Secondary | ICD-10-CM | POA: Diagnosis not present

## 2019-04-07 DIAGNOSIS — E1165 Type 2 diabetes mellitus with hyperglycemia: Secondary | ICD-10-CM | POA: Diagnosis not present

## 2019-04-07 MED ORDER — LANTUS SOLOSTAR 100 UNIT/ML ~~LOC~~ SOPN: PEN_INJECTOR | SUBCUTANEOUS | 2 refills | Status: DC

## 2019-04-08 NOTE — Progress Notes (Signed)
04/07/2019                                                    Endocrinology Telehealth Visit Follow up Note -During COVID -19 Pandemic  This visit type was conducted due to national recommendations for restrictions regarding the COVID-19 Pandemic  in an effort to limit this patient's exposure and mitigate transmission of the corona virus.  Due to his co-morbid illnesses, Patrick Moss is at  moderate to high risk for complications without adequate follow up.  This format is felt to be most appropriate for him at this time.  I connected with this patient on 04/07/2019 by telephone and verified that I am speaking with the correct person using two identifiers. Patrick Moss, 01-20-1941. he has verbally consented to this visit. All issues noted in this document were discussed and addressed. The format was not optimal for physical exam.     Subjective:    Patient ID: Patrick Moss, male    DOB: [birth year], PCP [name], MD   Past Medical History:  Diagnosis Date  . Diabetes mellitus without complication ([place])   . Hyperlipidemia   . Hypertension    Past Surgical History:  Procedure Laterality Date  . R foot fx     Social History   Socioeconomic History  . Marital status: Divorced    Spouse name: Not on file  . Number of children: Not on file  . Years of education: Not on file  . Highest education level: Not on file  Occupational History  . Not on file  Social Needs  . Financial resource strain: Not on file  . Food insecurity    Worry: Not on file    Inability: Not on file  . Transportation needs    Medical: Not on file    Non-medical: Not on file  Tobacco Use  . Smoking status: Never Smoker  . Smokeless tobacco: Never Used  Substance and Sexual Activity  . Alcohol use: No    Alcohol/week: 0.0 standard drinks    Frequency: Never  . Drug use: No  . Sexual activity: Not on file  Lifestyle  . Physical activity    Days per week: Not on file    Minutes per  session: Not on file  . Stress: Not on file  Relationships  . Social Herbalist on phone: Not on file    Gets together: Not on file    Attends religious service: Not on file    Active member of club or organization: Not on file    Attends meetings of clubs or organizations: Not on file    Relationship status: Not on file  Other Topics Concern  . Not on file  Social History Narrative  . Not on file   Outpatient Encounter Medications as of 04/07/2019  Medication Sig  . ACCU-CHEK SMARTVIEW test strip TEST BLOOD SUGAR TWO TO THREE TIMES DAILY AS DIRECTED  . amLODipine (NORVASC) 5 MG tablet Take 5 mg by mouth daily.  Marland Kitchen aspirin 81 MG tablet Take 81 mg by mouth daily.  . B-D ULTRAFINE III SHORT PEN 31G X 8 MM MISC USE  1  PEN  NEEDLE AS DIRECTED  . halobetasol (ULTRAVATE) 0.05 % cream Apply topically 2 (two) times daily.  . Insulin Glargine (LANTUS SOLOSTAR) 100  UNIT/ML Solostar Pen INJECT 70 UNITS INTO THE SKIN AT BEDTIME (DISCARD USED PEN AFTER 28 DAYS)  . losartan-hydrochlorothiazide (HYZAAR) 100-25 MG per tablet Take 1 tablet by mouth daily.  . metFORMIN (GLUCOPHAGE-XR) 500 MG 24 hr tablet Take 1,000 mg by mouth daily after breakfast.  . rosuvastatin (CRESTOR) 5 MG tablet Take 5 mg by mouth daily.  . [DISCONTINUED] LANTUS SOLOSTAR 100 UNIT/ML Solostar Pen INJECT 60 UNITS INTO THE SKIN AT BEDTIME (DISCARD USED PEN AFTER 28 DAYS)   No facility-administered encounter medications on file as of 04/07/2019.    ALLERGIES: Allergies  Allergen Reactions  . Sulfamethoxazole-Trimethoprim Hives   VACCINATION STATUS: Immunization History  Administered Date(s) Administered  . Influenza, High Dose Seasonal PF 03/28/2015, 04/07/2016, 04/30/2016, 03/20/2017, 03/17/2018, 03/07/2019  . Pneumococcal Conjugate-13 06/30/2016  . Pneumococcal Polysaccharide-23 07/15/2017    Diabetes He presents for his follow-up diabetic visit. He has type 2 diabetes mellitus. Onset time: He was diagnosed  at approximate age of 29 years. His disease course has been worsening. There are no hypoglycemic associated symptoms. Pertinent negatives for hypoglycemia include no confusion, headaches, pallor or seizures. Associated symptoms include polydipsia and polyuria. Pertinent negatives for diabetes include no chest pain, no fatigue, no polyphagia and no weakness. There are no hypoglycemic complications. Symptoms are worsening. There are no diabetic complications. Risk factors for coronary artery disease include diabetes mellitus, dyslipidemia, male sex, obesity and sedentary lifestyle. He is compliant with treatment most of the time. His weight is increasing steadily. He is following a generally unhealthy diet. He participates in exercise intermittently. His home blood glucose trend is increasing steadily. His breakfast blood glucose range is generally 140-180 mg/dl. His bedtime blood glucose range is generally 180-200 mg/dl. His overall blood glucose range is 180-200 mg/dl. An ACE inhibitor/angiotensin II receptor blocker is being taken.  Hyperlipidemia This is a chronic problem. The current episode started more than 1 year ago. The problem is uncontrolled. Exacerbating diseases include diabetes and obesity. Pertinent negatives include no chest pain, myalgias or shortness of breath. Current antihyperlipidemic treatment includes statins. Risk factors for coronary artery disease include dyslipidemia, hypertension, male sex, obesity and a sedentary lifestyle.  Hypertension This is a chronic problem. The current episode started more than 1 year ago. The problem is uncontrolled. Pertinent negatives include no chest pain, headaches, neck pain, palpitations or shortness of breath. Risk factors for coronary artery disease include dyslipidemia, diabetes mellitus, obesity and sedentary lifestyle. Past treatments include ACE inhibitors and angiotensin blockers.   Review of systems: Limited as above.  Objective:    There  were no vitals taken for this visit.  Wt Readings from Last 3 Encounters:  03/29/19 202 lb 12.8 oz (92 kg)  06/01/18 205 lb (93 kg)  02/16/18 203 lb (92.1 kg)     Recent Results (from the past 2160 hour(s))  CBC with Differential/Platelet     Status: None   Collection Time: 03/29/19  1:58 PM  Result Value Ref Range   WBC 7.9 3.4 - 10.8 x10E3/uL   RBC 5.01 4.14 - 5.80 x10E6/uL   Hemoglobin 15.5 13.0 - 17.7 g/dL   Hematocrit 45.0 37.5 - 51.0 %   MCV 90 79 - 97 fL   MCH 30.9 26.6 - 33.0 pg   MCHC 34.4 31.5 - 35.7 g/dL   RDW 12.9 11.6 - 15.4 %   Platelets 227 150 - 450 x10E3/uL   Neutrophils 56 Not Estab. %   Lymphs 37 Not Estab. %   Monocytes 6 Not Estab. %  Eos 1 Not Estab. %   Basos 0 Not Estab. %   Neutrophils Absolute 4.4 1.4 - 7.0 x10E3/uL   Lymphocytes Absolute 2.9 0.7 - 3.1 x10E3/uL   Monocytes Absolute 0.5 0.1 - 0.9 x10E3/uL   EOS (ABSOLUTE) 0.1 0.0 - 0.4 x10E3/uL   Basophils Absolute 0.0 0.0 - 0.2 x10E3/uL   Immature Granulocytes 0 Not Estab. %   Immature Grans (Abs) 0.0 0.0 - 0.1 x10E3/uL  CMP14+EGFR     Status: Abnormal   Collection Time: 03/29/19  1:58 PM  Result Value Ref Range   Glucose 121 (H) 65 - 99 mg/dL   BUN 15 8 - 27 mg/dL   Creatinine, Ser 1.28 (H) 0.76 - 1.27 mg/dL   GFR calc non Af Amer 54 (L) >59 mL/min/1.73   GFR calc Af Amer 62 >59 mL/min/1.73   BUN/Creatinine Ratio 12 10 - 24   Sodium 141 134 - 144 mmol/L   Potassium 4.3 3.5 - 5.2 mmol/L   Chloride 102 96 - 106 mmol/L   CO2 24 20 - 29 mmol/L   Calcium 10.1 8.6 - 10.2 mg/dL   Total Protein 7.1 6.0 - 8.5 g/dL   Albumin 4.6 3.7 - 4.7 g/dL   Globulin, Total 2.5 1.5 - 4.5 g/dL   Albumin/Globulin Ratio 1.8 1.2 - 2.2   Bilirubin Total 0.6 0.0 - 1.2 mg/dL   Alkaline Phosphatase 90 39 - 117 IU/L   AST 18 0 - 40 IU/L   ALT 27 0 - 44 IU/L  Lipid panel     Status: Abnormal   Collection Time: 03/29/19  1:58 PM  Result Value Ref Range   Cholesterol, Total 132 100 - 199 mg/dL   Triglycerides 166  (H) 0 - 149 mg/dL   HDL 38 (L) >39 mg/dL   VLDL Cholesterol Cal 28 5 - 40 mg/dL   LDL Chol Calc (NIH) 66 0 - 99 mg/dL   Chol/HDL Ratio 3.5 0.0 - 5.0 ratio    Comment:                                   T. Chol/HDL Ratio                                             Men  Women                               1/2 Avg.Risk  3.4    3.3                                   Avg.Risk  5.0    4.4                                2X Avg.Risk  9.6    7.1                                3X Avg.Risk 23.4   11.0   PSA Total (Reflex To Free)     Status: Abnormal   Collection Time: 03/29/19  1:58  PM  Result Value Ref Range   Prostate Specific Ag, Serum 8.0 (H) 0.0 - 4.0 ng/mL    Comment: Roche ECLIA methodology. According to the American Urological Association, Serum PSA should decrease and remain at undetectable levels after radical prostatectomy. The AUA defines biochemical recurrence as an initial PSA value 0.2 ng/mL or greater followed by a subsequent confirmatory PSA value 0.2 ng/mL or greater. Values obtained with different assay methods or kits cannot be used interchangeably. Results cannot be interpreted as absolute evidence of the presence or absence of malignant disease.    Reflex Criteria Comment     Comment: The percent free PSA is performed on a reflex basis only when the total PSA is between 4.0 and 10.0 ng/mL.   Bayer DCA Hb A1c Waived     Status: Abnormal   Collection Time: 03/29/19  1:58 PM  Result Value Ref Range   HB A1C (BAYER DCA - WAIVED) 7.4 (H) <7.0 %    Comment:                                       Diabetic Adult            <7.0                                       Healthy Adult        4.3 - 5.7                                                           (DCCT/NGSP) American Diabetes Association's Summary of Glycemic Recommendations for Adults with Diabetes: Hemoglobin A1c <7.0%. More stringent glycemic goals (A1c <6.0%) may further reduce complications at the cost of increased  risk of hypoglycemia.   %fPSA Reflex     Status: None   Collection Time: 03/29/19  1:58 PM  Result Value Ref Range   PSA, FREE 1.49 N/A ng/mL    Comment: Roche ECLIA methodology.   % FREE PSA 18.6 %    Comment: The table below lists the probability of prostate cancer for men with non-suspicious DRE results and total PSA between 4 and 10 ng/mL, by patient age Ricci Barker, [place], [id number]).                   % Free PSA       50-64 yr        65-75 yr                   0.00-10.00%        56%             55%                  10.01-15.00%        24%             35%                  15.01-20.00%        17%             23%  20.01-25.00%        10%             20%                       >25.00%         5%              9% Please note:  Catalona et al did not make specific               recommendations regarding the use of               percent free PSA for any other population               of men.   Urinalysis     Status: None   Collection Time: 03/29/19  2:19 PM  Result Value Ref Range   Specific Gravity, UA 1.025 1.005 - 1.030   pH, UA 5.5 5.0 - 7.5   Color, UA Yellow Yellow   Appearance Ur Clear Clear   Leukocytes,UA Negative Negative   Protein,UA Negative Negative/Trace   Glucose, UA Negative Negative   Ketones, UA Negative Negative   RBC, UA Negative Negative   Bilirubin, UA Negative Negative   Urobilinogen, Ur 0.2 0.2 - 1.0 mg/dL   Nitrite, UA Negative Negative    Assessment & Plan:   1. Uncontrolled type 2 diabetes mellitus without complication, without long-term current use of insulin ([facility])   Pt remains at a high risk for more acute and chronic complications of diabetes which include CAD, CVA, CKD, retinopathy, and neuropathy. These are all discussed in detail with the patient.  -He missed his appointment since December 2019.  He reports above target glycemic profile and his A1c is higher at 8% increasing from 7.4%.     -No documented nor reported  hypoglycemia.  -  Recent labs reviewed. - I have re-counseled the patient on diet management and weight loss  by adopting a carbohydrate restricted / protein rich  Diet.  - he  admits there is a room for improvement in his diet and drink choices. -  Suggestion is made for him to avoid simple carbohydrates  from his diet including Cakes, Sweet Desserts / Pastries, Ice Cream, Soda (diet and regular), Sweet Tea, Candies, Chips, Cookies, Sweet Pastries,  Store Bought Juices, Alcohol in Excess of  1-2 drinks a day, Artificial Sweeteners, Coffee Creamer, and "Sugar-free" Products. This will help patient to have stable blood glucose profile and potentially avoid unintended weight gain.   - Patient is advised to stick to a routine mealtimes to eat 3 meals  a day and avoid unnecessary snacks ( to snack only to correct hypoglycemia).  - I have approached patient with the following individualized plan to manage diabetes and patient agrees.   -He will not require prandial insulin for now.  He is advised to increase his Lantus to 70 units nightly  associated with strict monitoring of blood glucose 2 times a day-daily before breakfast and at bedtime.  He is advised to continue metformin 1000 mg ER p.o. daily after breakfast.    -He expresses concern on cost of insulin, will be considered for either Novolin 70/30 to use twice a day or addition of glipizide if his A1c remains above 8% during his next visit.  - Patient specific target  for A1c; LDL, HDL, Triglycerides, and  Waist Circumference were discussed in detail.  2) BP/HTN: he  is advised to home monitor blood pressure and report if > 140/90 on 2 separate readings.    He is advised to continue his current blood pressure medications including amlodipine 5 mg p.o. daily, Hyzaar 100-25 mg p.o. daily.   3) Lipids/HPL: His recent lipid panel showed controlled LDL at 57, uncontrolled triglyceride 246.  He is advised to continue Crestor 5 mg p.o. nightly.       4)  Weight/Diet:   no success and weight loss, CDE consult in progress, exercise, and carbohydrates information provided.  5) Chronic Care/Health Maintenance:  -Patient is on ACEI/ARB and Statin medications and encouraged to continue to follow up with Ophthalmology, Podiatrist at least yearly or according to recommendations, and advised to  stay away from smoking. I have recommended yearly flu vaccine and pneumonia vaccination at least every 5 years; moderate intensity exercise for up to 150 minutes weekly; and  sleep for at least 7 hours a day.  - I advised patient to maintain close follow up with [name], MD for primary care needs.  - Patient Care Time Today:  25 min, of which >50% was spent in  counseling and the rest reviewing his  current and  previous labs/studies, previous treatments, his blood glucose readings, and medications' doses and developing a plan for long-term care based on the latest recommendations for standards of care.   Patrick Moss participated in the discussions, expressed understanding, and voiced agreement with the above plans.  All questions were answered to his satisfaction. he is encouraged to contact clinic should he have any questions or concerns prior to his return visit.  Follow up plan: -Return in about 4 months (around 08/08/2019) for Follow up with Meter and Logs Only - no Labs, Include 8 log sheets.  Glade Lloyd, MD Phone: [phone]  Fax: [phone]  -  This note was partially dictated with voice recognition software. Similar sounding words can be transcribed inadequately or may not  be corrected upon review.  04/08/2019, 12:33 PM

## 2019-07-11 ENCOUNTER — Other Ambulatory Visit: Payer: Self-pay | Admitting: *Deleted

## 2019-07-11 MED ORDER — LOSARTAN POTASSIUM-HCTZ 100-25 MG PO TABS: 1.0000 | ORAL_TABLET | Freq: Every day | ORAL | 3 refills | Status: DC

## 2019-07-11 MED ORDER — ROSUVASTATIN CALCIUM 5 MG PO TABS: 5.0000 mg | ORAL_TABLET | Freq: Every day | ORAL | 1 refills | Status: DC

## 2019-07-11 MED ORDER — AMLODIPINE BESYLATE 5 MG PO TABS: 5.0000 mg | ORAL_TABLET | Freq: Every day | ORAL | 1 refills | Status: DC

## 2019-08-12 ENCOUNTER — Other Ambulatory Visit: Payer: Self-pay

## 2019-08-12 ENCOUNTER — Ambulatory Visit: Payer: Medicare HMO | Admitting: "Endocrinology

## 2019-08-12 ENCOUNTER — Encounter: Payer: Self-pay | Admitting: "Endocrinology

## 2019-08-12 VITALS — BP 165/81 | HR 74 | Ht 66.0 in | Wt 202.8 lb

## 2019-08-12 DIAGNOSIS — E1165 Type 2 diabetes mellitus with hyperglycemia: Secondary | ICD-10-CM | POA: Diagnosis not present

## 2019-08-12 DIAGNOSIS — E782 Mixed hyperlipidemia: Secondary | ICD-10-CM

## 2019-08-12 DIAGNOSIS — I10 Essential (primary) hypertension: Secondary | ICD-10-CM | POA: Diagnosis not present

## 2019-08-12 LAB — POCT GLYCOSYLATED HEMOGLOBIN (HGB A1C): Hemoglobin A1C: 7.6 % — AB (ref 4.0–5.6)

## 2019-08-12 NOTE — Patient Instructions (Signed)

## 2019-08-12 NOTE — Progress Notes (Signed)
04/07/2019                                                    Endocrinology Telehealth Visit Follow up Note -During COVID -19 Pandemic  This visit type was conducted due to national recommendations for restrictions regarding the COVID-19 Pandemic  in an effort to limit this patient's exposure and mitigate transmission of the corona virus.  Due to his co-morbid illnesses, Patrick Moss is at  moderate to high risk for complications without adequate follow up.  This format is felt to be most appropriate for him at this time.  I connected with this patient on 04/07/2019 by telephone and verified that I am speaking with the correct person using two identifiers. Patrick Moss, 1940/11/26. he has verbally consented to this visit. All issues noted in this document were discussed and addressed. The format was not optimal for physical exam.     Subjective:    Patient ID: Patrick Moss, male    DOB: 07-22-1940, PCP [name], MD   Past Medical History:  Diagnosis Date  . Diabetes mellitus without complication ([place])   . Hyperlipidemia   . Hypertension    Past Surgical History:  Procedure Laterality Date  . R foot fx     Social History   Socioeconomic History  . Marital status: Divorced    Spouse name: Not on file  . Number of children: Not on file  . Years of education: Not on file  . Highest education level: Not on file  Occupational History  . Not on file  Tobacco Use  . Smoking status: Never Smoker  . Smokeless tobacco: Never Used  Substance and Sexual Activity  . Alcohol use: No    Alcohol/week: 0.0 standard drinks  . Drug use: No  . Sexual activity: Not on file  Other Topics Concern  . Not on file  Social History Narrative  . Not on file   Social Determinants of Health   Financial Resource Strain:   . Difficulty of Paying Living Expenses: Not on file  Food Insecurity:   . Worried About Charity fundraiser in the Last Year: Not on file  . Ran Out of Food in  the Last Year: Not on file  Transportation Needs:   . Lack of Transportation (Medical): Not on file  . Lack of Transportation (Non-Medical): Not on file  Physical Activity:   . Days of Exercise per Week: Not on file  . Minutes of Exercise per Session: Not on file  Stress:   . Feeling of Stress : Not on file  Social Connections:   . Frequency of Communication with Friends and Family: Not on file  . Frequency of Social Gatherings with Friends and Family: Not on file  . Attends Religious Services: Not on file  . Active Member of Clubs or Organizations: Not on file  . Attends Archivist Meetings: Not on file  . Marital Status: Not on file   Outpatient Encounter Medications as of 08/12/2019  Medication Sig  . ACCU-CHEK SMARTVIEW test strip TEST BLOOD SUGAR TWO TO THREE TIMES DAILY AS DIRECTED  . amLODipine (NORVASC) 5 MG tablet Take 1 tablet (5 mg total) by mouth daily.  Marland Kitchen aspirin 81 MG tablet Take 81 mg by mouth daily.  . B-D ULTRAFINE III SHORT PEN  31G X 8 MM MISC USE  1  PEN  NEEDLE AS DIRECTED  . halobetasol (ULTRAVATE) 0.05 % cream Apply topically 2 (two) times daily.  . Insulin Glargine (LANTUS SOLOSTAR) 100 UNIT/ML Solostar Pen INJECT 70 UNITS INTO THE SKIN AT BEDTIME (DISCARD USED PEN AFTER 28 DAYS)  . losartan-hydrochlorothiazide (HYZAAR) 100-25 MG tablet Take 1 tablet by mouth daily.  . metFORMIN (GLUCOPHAGE-XR) 500 MG 24 hr tablet Take 1,000 mg by mouth daily after breakfast.  . rosuvastatin (CRESTOR) 5 MG tablet Take 1 tablet (5 mg total) by mouth daily.   No facility-administered encounter medications on file as of 08/12/2019.   ALLERGIES: Allergies  Allergen Reactions  . Sulfamethoxazole-Trimethoprim Hives   VACCINATION STATUS: Immunization History  Administered Date(s) Administered  . Influenza, High Dose Seasonal PF 03/28/2015, 04/07/2016, 04/30/2016, 03/20/2017, 03/17/2018, 03/07/2019  . Pneumococcal Conjugate-13 06/30/2016  . Pneumococcal  Polysaccharide-23 07/15/2017    Diabetes He presents for his follow-up diabetic visit. He has type 2 diabetes mellitus. Onset time: He was diagnosed at approximate age of 22 years. His disease course has been worsening. There are no hypoglycemic associated symptoms. Pertinent negatives for hypoglycemia include no confusion, headaches, pallor or seizures. Associated symptoms include polydipsia and polyuria. Pertinent negatives for diabetes include no chest pain, no fatigue, no polyphagia and no weakness. There are no hypoglycemic complications. Symptoms are improving. There are no diabetic complications. Risk factors for coronary artery disease include diabetes mellitus, dyslipidemia, male sex, obesity and sedentary lifestyle. He is compliant with treatment most of the time. His weight is increasing steadily. He is following a generally unhealthy diet. He participates in exercise intermittently. His home blood glucose trend is increasing steadily. His breakfast blood glucose range is generally 140-180 mg/dl. His bedtime blood glucose range is generally 140-180 mg/dl. His overall blood glucose range is 140-180 mg/dl. (Presents with controlled glycemic profile at fasting and at bedtime.  His point-of-care A1c 7.6%, unchanged from his last time A1c.) An ACE inhibitor/angiotensin II receptor blocker is being taken.  Hyperlipidemia This is a chronic problem. The current episode started more than 1 year ago. The problem is uncontrolled. Exacerbating diseases include diabetes and obesity. Pertinent negatives include no chest pain, myalgias or shortness of breath. Current antihyperlipidemic treatment includes statins. Risk factors for coronary artery disease include dyslipidemia, hypertension, male sex, obesity and a sedentary lifestyle.  Hypertension This is a chronic problem. The current episode started more than 1 year ago. The problem is uncontrolled. Pertinent negatives include no chest pain, headaches, neck  pain, palpitations or shortness of breath. Risk factors for coronary artery disease include dyslipidemia, diabetes mellitus, obesity and sedentary lifestyle. Past treatments include ACE inhibitors and angiotensin blockers.    Review of systems  Constitutional: + Minimally fluctuating body weight,  current  Body mass index is 32.73 kg/m. , no fatigue, no subjective hyperthermia, no subjective hypothermia Eyes: no blurry vision, no xerophthalmia ENT: no sore throat, no nodules palpated in throat, no dysphagia/odynophagia, no hoarseness Cardiovascular: no Chest Pain, no Shortness of Breath, no palpitations, no leg swelling Respiratory: no cough, no shortness of breath Gastrointestinal: no Nausea/Vomiting/Diarhhea Musculoskeletal: no muscle/joint aches Skin: no rashes, no hyperemia Neurological: no tremors, no numbness, no tingling, no dizziness Psychiatric: no depression, no anxiety   Objective:    BP (!) 165/81   Pulse 74   Ht 5\' 6"  (1.676 m)   Wt 202 lb 12.8 oz (92 kg)   BMI 32.73 kg/m   Wt Readings from Last 3 Encounters:  08/12/19 202  lb 12.8 oz (92 kg)  03/29/19 202 lb 12.8 oz (92 kg)  06/01/18 205 lb (93 kg)     Physical Exam- Limited  Constitutional:  Body mass index is 32.73 kg/m. , not in acute distress, normal state of mind Eyes:  EOMI, no exophthalmos Neck: Supple Thyroid: No gross goiter Respiratory: Adequate breathing efforts Musculoskeletal: no gross deformities, strength intact in all four extremities, no gross restriction of joint movements Skin:  no rashes, no hyperemia Neurological: no tremor with outstretched hands,   Recent Results (from the past 2160 hour(s))  HgB A1c     Status: Abnormal   Collection Time: 08/12/19 11:55 AM  Result Value Ref Range   Hemoglobin A1C 7.6 (A) 4.0 - 5.6 %   HbA1c POC (<> result, manual entry)     HbA1c, POC (prediabetic range)     HbA1c, POC (controlled diabetic range)      Assessment & Plan:   1. Uncontrolled  type 2 diabetes mellitus without complication, without long-term current use of insulin ([facility])   Pt remains at a high risk for more acute and chronic complications of diabetes which include CAD, CVA, CKD, retinopathy, and neuropathy. These are all discussed in detail with the patient. Presents with controlled glycemic profile at fasting and at bedtime.  His point-of-care A1c 7.6%, unchanged from his last time A1c.  -No documented nor reported hypoglycemia.  -  Recent labs reviewed. - I have re-counseled the patient on diet management and weight loss  by adopting a carbohydrate restricted / protein rich  Diet.  - he  admits there is a room for improvement in his diet and drink choices. -  Suggestion is made for him to avoid simple carbohydrates  from his diet including Cakes, Sweet Desserts / Pastries, Ice Cream, Soda (diet and regular), Sweet Tea, Candies, Chips, Cookies, Sweet Pastries,  Store Bought Juices, Alcohol in Excess of  1-2 drinks a day, Artificial Sweeteners, Coffee Creamer, and "Sugar-free" Products. This will help patient to have stable blood glucose profile and potentially avoid unintended weight gain.   - Patient is advised to stick to a routine mealtimes to eat 3 meals  a day and avoid unnecessary snacks ( to snack only to correct hypoglycemia).  - I have approached patient with the following individualized plan to manage diabetes and patient agrees.   -Based on his presentation with near target glycemic profile, he will not need prandial insulin for now.  He is advised to continue  Lantus  70 units nightly  associated with strict monitoring of blood glucose 2 times a day-daily before breakfast and at bedtime.  He is advised to continue metformin 1000 mg ER p.o. daily after breakfast.    -He expresses concern on cost of insulin, will be considered for either Novolin 70/30 to use twice a day or addition of glipizide if his A1c remains above 8% during his next visit.  - Patient  specific target  for A1c; LDL, HDL, Triglycerides, and  Waist Circumference were discussed in detail.  2) BP/HTN:  His blood pressure is not controlled to target.    He is advised to continue his current blood pressure medications including amlodipine 5 mg p.o. daily, Hyzaar 100-25 mg p.o. daily.   3) Lipids/HPL: His recent lipid panel showed controlled LDL at 57, uncontrolled triglyceride 246.  He is advised to continue Crestor 5 mg p.o. nightly.      4)  Weight/Diet:   no success and weight loss, CDE consult in  progress, exercise, and carbohydrates information provided.  5) Chronic Care/Health Maintenance:  -Patient is on ACEI/ARB and Statin medications and encouraged to continue to follow up with Ophthalmology, Podiatrist at least yearly or according to recommendations, and advised to  stay away from smoking. I have recommended yearly flu vaccine and pneumonia vaccination at least every 5 years; moderate intensity exercise for up to 150 minutes weekly; and  sleep for at least 7 hours a day.  - I advised patient to maintain close follow up with [name], MD for primary care needs.  - Time spent on this patient care encounter:  35 min, of which > 50% was spent in  counseling and the rest reviewing his blood glucose logs , discussing his hypoglycemia and hyperglycemia episodes, reviewing his current and  previous labs / studies  ( including abstraction from other facilities) and medications  doses and developing a  long term treatment plan and documenting his care.   Please refer to Patient Instructions for Blood Glucose Monitoring and Insulin/Medications Dosing Guide"  in media tab for additional information. Please  also refer to " Patient Self Inventory" in the Media  tab for reviewed elements of pertinent patient history.  Patrick Moss participated in the discussions, expressed understanding, and voiced agreement with the above plans.  All questions were answered to his satisfaction.  he is encouraged to contact clinic should he have any questions or concerns prior to his return visit.  Follow up plan: -Return in about 4 months (around 12/10/2019) for Bring Meter and Logs- A1c in Office, Follow up with Pre-visit Labs.  Glade Lloyd, MD Phone: [phone]  Fax: [phone]  -  This note was partially dictated with voice recognition software. Similar sounding words can be transcribed inadequately or may not  be corrected upon review.  08/12/2019, 1:58 PM

## 2019-08-29 ENCOUNTER — Ambulatory Visit (INDEPENDENT_AMBULATORY_CARE_PROVIDER_SITE_OTHER): Payer: Medicare HMO | Admitting: Nurse Practitioner

## 2019-08-29 ENCOUNTER — Encounter: Payer: Self-pay | Admitting: Nurse Practitioner

## 2019-08-29 ENCOUNTER — Other Ambulatory Visit: Payer: Self-pay

## 2019-08-29 VITALS — BP 132/70 | HR 71 | Temp 97.3°F | Ht 66.0 in | Wt 204.8 lb

## 2019-08-29 DIAGNOSIS — E559 Vitamin D deficiency, unspecified: Secondary | ICD-10-CM | POA: Diagnosis not present

## 2019-08-29 DIAGNOSIS — E1165 Type 2 diabetes mellitus with hyperglycemia: Secondary | ICD-10-CM | POA: Diagnosis not present

## 2019-08-29 DIAGNOSIS — M5441 Lumbago with sciatica, right side: Secondary | ICD-10-CM

## 2019-08-29 MED ORDER — DICLOFENAC SODIUM 1 % EX GEL: 4.0000 g | Freq: Four times a day (QID) | CUTANEOUS | 1 refills | Status: DC

## 2019-08-29 NOTE — Patient Instructions (Signed)
Acute Back Pain, Adult Acute back pain is sudden and usually short-lived. It is often caused by an injury to the muscles and tissues in the back. The injury may result from:  A muscle or ligament getting overstretched or torn (strained). Ligaments are tissues that connect bones to each other. Lifting something improperly can cause a back strain.  Wear and tear (degeneration) of the spinal disks. Spinal disks are circular tissue that provides cushioning between the bones of the spine (vertebrae).  Twisting motions, such as while playing sports or doing yard work.  A hit to the back.  Arthritis. You may have a physical exam, lab tests, and imaging tests to find the cause of your pain. Acute back pain usually goes away with rest and home care. Follow these instructions at home: Managing pain, stiffness, and swelling  Take over-the-counter and prescription medicines only as told by your health care provider.  Your health care provider may recommend applying ice during the first 24-48 hours after your pain starts. To do this: ? Put ice in a plastic bag. ? Place a towel between your skin and the bag. ? Leave the ice on for 20 minutes, 2-3 times a day.  If directed, apply heat to the affected area as often as told by your health care provider. Use the heat source that your health care provider recommends, such as a moist heat pack or a heating pad. ? Place a towel between your skin and the heat source. ? Leave the heat on for 20-30 minutes. ? Remove the heat if your skin turns bright red. This is especially important if you are unable to feel pain, heat, or cold. You have a greater risk of getting burned. Activity   Do not stay in bed. Staying in bed for more than 1-2 days can delay your recovery.  Sit up and stand up straight. Avoid leaning forward when you sit, or hunching over when you stand. ? If you work at a desk, sit close to it so you do not need to lean over. Keep your chin tucked  in. Keep your neck drawn back, and keep your elbows bent at a right angle. Your arms should look like the letter "L." ? Sit high and close to the steering wheel when you drive. Add lower back (lumbar) support to your car seat, if needed.  Take short walks on even surfaces as soon as you are able. Try to increase the length of time you walk each day.  Do not sit, drive, or stand in one place for more than 30 minutes at a time. Sitting or standing for long periods of time can put stress on your back.  Do not drive or use heavy machinery while taking prescription pain medicine.  Use proper lifting techniques. When you bend and lift, use positions that put less stress on your back: ? Bend your knees. ? Keep the load close to your body. ? Avoid twisting.  Exercise regularly as told by your health care provider. Exercising helps your back heal faster and helps prevent back injuries by keeping muscles strong and flexible.  Work with a physical therapist to make a safe exercise program, as recommended by your health care provider. Do any exercises as told by your physical therapist. Lifestyle  Maintain a healthy weight. Extra weight puts stress on your back and makes it difficult to have good posture.  Avoid activities or situations that make you feel anxious or stressed. Stress and anxiety increase muscle   tension and can make back pain worse. Learn ways to manage anxiety and stress, such as through exercise. General instructions  Sleep on a firm mattress in a comfortable position. Try lying on your side with your knees slightly bent. If you lie on your back, put a pillow under your knees.  Follow your treatment plan as told by your health care provider. This may include: ? Cognitive or behavioral therapy. ? Acupuncture or massage therapy. ? Meditation or yoga. Contact a health care provider if:  You have pain that is not relieved with rest or medicine.  You have increasing pain going down  into your legs or buttocks.  Your pain does not improve after 2 weeks.  You have pain at night.  You lose weight without trying.  You have a fever or chills. Get help right away if:  You develop new bowel or bladder control problems.  You have unusual weakness or numbness in your arms or legs.  You develop nausea or vomiting.  You develop abdominal pain.  You feel faint. Summary  Acute back pain is sudden and usually short-lived.  Use proper lifting techniques. When you bend and lift, use positions that put less stress on your back.  Take over-the-counter and prescription medicines and apply heat or ice as directed by your health care provider. This information is not intended to replace advice given to you by your health care provider. Make sure you discuss any questions you have with your health care provider. Document Revised: 09/21/2018 Document Reviewed: 01/14/2017 Elsevier Patient Education  2020 [employer]  

## 2019-08-29 NOTE — Progress Notes (Signed)
   Subjective:    Patient ID: Patrick Moss, male    DOB: 19-Apr-1941, 79 y.o.   MRN: [id number]   Chief Complaint: Back Pain   HPI Patient come sin c/o low back pain. He has history of bulging dic that has recently flared up. He describes the pain stabbing pain. Radiates down his right leg when he sits down. Rates pain 0/10 currently but at night  Rated a 4/10. Just enough pain to keep you awake. It has improved the last 2 days with ibuprofen.   Review of Systems  Constitutional: Negative for diaphoresis.  Eyes: Negative for pain.  Respiratory: Negative for shortness of breath.   Cardiovascular: Negative for chest pain, palpitations and leg swelling.  Gastrointestinal: Negative for abdominal pain.  Endocrine: Negative for polydipsia.  Musculoskeletal: Positive for back pain.  Skin: Negative for rash.  Neurological: Negative for dizziness, weakness and headaches.  Hematological: Does not bruise/bleed easily.  Psychiatric/Behavioral: Negative.   All other systems reviewed and are negative.      Objective:   Physical Exam Vitals and nursing note reviewed.  Constitutional:      Appearance: Normal appearance.  Cardiovascular:     Rate and Rhythm: Normal rate and regular rhythm.     Heart sounds: Normal heart sounds.  Pulmonary:     Breath sounds: Normal breath sounds.  Musculoskeletal:     Comments: FROM of lumbar spine with pain on rotation (-) SLR bil motor strength and sensation distally intact  Skin:    General: Skin is warm and dry.  Neurological:     General: No focal deficit present.     Mental Status: He is alert and oriented to person, place, and time.    BP 132/70   Pulse 71   Temp (!) 97.3 F (36.3 C) (Temporal)   Ht 5\' 6"  (1.676 m)   Wt 204 lb 12.8 oz (92.9 kg)   SpO2 98%   BMI 33.06 kg/m          Assessment & Plan:  Patrick Moss in today with chief complaint of Back Pain   1. Acute right-sided low back pain with right-sided  sciatica Moist heat Rest Meds ordered this encounter  Medications  . diclofenac Sodium (VOLTAREN) 1 % GEL    Sig: Apply 4 g topically 4 (four) times daily.    Dispense:  350 g    Refill:  1    Order Specific Question:   Supervising Provider    Answer:   Caryl Pina A [id number]   Follow up with DR. [name] as needed    The above assessment and management plan was discussed with the patient. The patient verbalized understanding of and has agreed to the management plan. Patient is aware to call the clinic if symptoms persist or worsen. Patient is aware when to return to the clinic for a follow-up visit. Patient educated on when it is appropriate to go to the emergency department.   Mary-Margaret Hassell Done, FNP

## 2019-08-30 ENCOUNTER — Other Ambulatory Visit: Payer: Self-pay | Admitting: "Endocrinology

## 2019-08-30 LAB — COMPREHENSIVE METABOLIC PANEL
ALT: 21 IU/L (ref 0–44)
AST: 18 IU/L (ref 0–40)
Albumin/Globulin Ratio: 2.1 (ref 1.2–2.2)
Albumin: 4.8 g/dL — ABNORMAL HIGH (ref 3.7–4.7)
Alkaline Phosphatase: 82 IU/L (ref 39–117)
BUN/Creatinine Ratio: 15 (ref 10–24)
BUN: 21 mg/dL (ref 8–27)
Bilirubin Total: 0.6 mg/dL (ref 0.0–1.2)
CO2: 22 mmol/L (ref 20–29)
Calcium: 10 mg/dL (ref 8.6–10.2)
Chloride: 100 mmol/L (ref 96–106)
Creatinine, Ser: 1.42 mg/dL — ABNORMAL HIGH (ref 0.76–1.27)
GFR calc Af Amer: 55 mL/min/{1.73_m2} — ABNORMAL LOW (ref >59)
GFR calc non Af Amer: 47 mL/min/{1.73_m2} — ABNORMAL LOW (ref >59)
Globulin, Total: 2.3 g/dL (ref 1.5–4.5)
Glucose: 116 mg/dL — ABNORMAL HIGH (ref 65–99)
Potassium: 4.6 mmol/L (ref 3.5–5.2)
Sodium: 139 mmol/L (ref 134–144)
Total Protein: 7.1 g/dL (ref 6.0–8.5)

## 2019-08-30 LAB — VITAMIN D 25 HYDROXY (VIT D DEFICIENCY, FRACTURES): Vit D, 25-Hydroxy: 14.7 ng/mL — ABNORMAL LOW (ref 30.0–100.0)

## 2019-08-30 LAB — MICROALBUMIN / CREATININE URINE RATIO
Creatinine, Urine: 140.5 mg/dL
Microalb/Creat Ratio: 20 mg/g creat (ref 0–29)
Microalbumin, Urine: 28.8 ug/mL

## 2019-08-30 LAB — T4, FREE: Free T4: 1.27 ng/dL (ref 0.82–1.77)

## 2019-08-30 LAB — TSH: TSH: 3.45 u[IU]/mL (ref 0.450–4.500)

## 2019-08-30 MED ORDER — VITAMIN D (ERGOCALCIFEROL) 1.25 MG (50000 UNIT) PO CAPS: 50000.0000 [IU] | ORAL_CAPSULE | ORAL | 0 refills | Status: DC

## 2019-09-12 DIAGNOSIS — H40023 Open angle with borderline findings, high risk, bilateral: Secondary | ICD-10-CM | POA: Diagnosis not present

## 2019-09-12 DIAGNOSIS — E119 Type 2 diabetes mellitus without complications: Secondary | ICD-10-CM | POA: Diagnosis not present

## 2019-09-12 DIAGNOSIS — Z961 Presence of intraocular lens: Secondary | ICD-10-CM | POA: Diagnosis not present

## 2019-09-12 DIAGNOSIS — Z794 Long term (current) use of insulin: Secondary | ICD-10-CM | POA: Diagnosis not present

## 2019-09-15 LAB — HM DIABETES EYE EXAM

## 2019-09-27 ENCOUNTER — Encounter: Payer: Self-pay | Admitting: Family Medicine

## 2019-09-27 ENCOUNTER — Ambulatory Visit (INDEPENDENT_AMBULATORY_CARE_PROVIDER_SITE_OTHER): Payer: Medicare HMO | Admitting: Family Medicine

## 2019-09-27 ENCOUNTER — Other Ambulatory Visit: Payer: Self-pay

## 2019-09-27 VITALS — BP 153/75 | HR 66 | Temp 98.2°F | Resp 20 | Ht 66.0 in | Wt 205.2 lb

## 2019-09-27 DIAGNOSIS — E1149 Type 2 diabetes mellitus with other diabetic neurological complication: Secondary | ICD-10-CM | POA: Diagnosis not present

## 2019-09-27 DIAGNOSIS — E6609 Other obesity due to excess calories: Secondary | ICD-10-CM | POA: Diagnosis not present

## 2019-09-27 DIAGNOSIS — Z6832 Body mass index (BMI) 32.0-32.9, adult: Secondary | ICD-10-CM | POA: Diagnosis not present

## 2019-09-27 DIAGNOSIS — M47816 Spondylosis without myelopathy or radiculopathy, lumbar region: Secondary | ICD-10-CM | POA: Diagnosis not present

## 2019-09-27 DIAGNOSIS — I10 Essential (primary) hypertension: Secondary | ICD-10-CM | POA: Diagnosis not present

## 2019-09-27 DIAGNOSIS — E782 Mixed hyperlipidemia: Secondary | ICD-10-CM

## 2019-09-27 DIAGNOSIS — I152 Hypertension secondary to endocrine disorders: Secondary | ICD-10-CM

## 2019-09-27 DIAGNOSIS — E1159 Type 2 diabetes mellitus with other circulatory complications: Secondary | ICD-10-CM | POA: Diagnosis not present

## 2019-09-27 MED ORDER — AMLODIPINE BESYLATE 5 MG PO TABS: 5.0000 mg | ORAL_TABLET | Freq: Every day | ORAL | 1 refills | Status: DC

## 2019-09-27 MED ORDER — ROSUVASTATIN CALCIUM 5 MG PO TABS: 5.0000 mg | ORAL_TABLET | Freq: Every day | ORAL | 1 refills | Status: DC

## 2019-09-27 MED ORDER — VALSARTAN-HYDROCHLOROTHIAZIDE 320-25 MG PO TABS: 1.0000 | ORAL_TABLET | Freq: Every day | ORAL | 2 refills | Status: DC

## 2019-09-27 MED ORDER — CELECOXIB 200 MG PO CAPS: 200.0000 mg | ORAL_CAPSULE | Freq: Every day | ORAL | 5 refills | Status: DC

## 2019-09-27 NOTE — Progress Notes (Signed)
Subjective:  Patient ID: Patrick Moss,  male    DOB: 12-Nov-1940  Age: 79 y.o.    CC: Medical Management of Chronic Issues   HPI Patrick Moss presents for  follow-up of hypertension. Patient has no history of headache chest pain or shortness of breath or recent cough. Patient also denies symptoms of TIA such as numbness weakness lateralizing. Patient denies side effects from medication. States taking it regularly.  Patient also  in for follow-up of elevated cholesterol. Doing well without complaints on current medication. Denies side effects  including myalgia and arthralgia and nausea. Also in today for liver function testing. Currently no chest pain, shortness of breath or other cardiovascular related symptoms noted.  Follow-up of diabetes. Patient does check blood sugar at home. Readings run between 90 and 125 fasting and 148-180 PP.  Patient denies symptoms such as excessive hunger or urinary frequency, excessive hunger, nausea No significant hypoglycemic spells noted. Medications reviewed. Pt reports taking them regularly. Pt. denies complication/adverse reaction today.    History Patrick Moss has a past medical history of Diabetes mellitus without complication ([place]), Hyperlipidemia, and Hypertension.   He has a past surgical history that includes R foot fx.   His family history includes Diabetes in his brother, father, and mother.He reports that he has never smoked. He has never used smokeless tobacco. He reports that he does not drink alcohol or use drugs.  Current Outpatient Medications on File Prior to Visit  Medication Sig Dispense Refill  . ACCU-CHEK SMARTVIEW test strip TEST BLOOD SUGAR TWO TO THREE TIMES DAILY AS DIRECTED 300 each 1  . aspirin 81 MG tablet Take 81 mg by mouth daily.    . B-D ULTRAFINE III SHORT PEN 31G X 8 MM MISC USE  1  PEN  NEEDLE AS DIRECTED 270 each 3  . Insulin Glargine (LANTUS SOLOSTAR) 100 UNIT/ML Solostar Pen INJECT 70 UNITS INTO THE SKIN AT  BEDTIME (DISCARD USED PEN AFTER 28 DAYS) 60 mL 2  . metFORMIN (GLUCOPHAGE-XR) 500 MG 24 hr tablet Take 1,000 mg by mouth daily after breakfast.    . Vitamin D, Ergocalciferol, (DRISDOL) 1.25 MG (50000 UNIT) CAPS capsule Take 1 capsule (50,000 Units total) by mouth every 7 (seven) days. 12 capsule 0   No current facility-administered medications on file prior to visit.    ROS Review of Systems  Constitutional: Negative.   HENT: Negative.   Eyes: Negative for visual disturbance.  Respiratory: Negative for cough and shortness of breath.   Cardiovascular: Negative for chest pain and leg swelling.  Gastrointestinal: Negative for abdominal pain, diarrhea, nausea and vomiting.  Genitourinary: Negative for difficulty urinating.  Musculoskeletal: Negative for arthralgias and myalgias.  Skin: Negative for rash.  Neurological: Positive for numbness (legs go numb with exertion, but no cramping). Negative for headaches.  Psychiatric/Behavioral: Negative for sleep disturbance.    Objective:  BP (!) 153/75   Pulse 66   Temp 98.2 F (36.8 C) (Oral)   Resp 20   Ht _0  (1.676 m)   Wt 205 lb 4 oz (93.1 kg)   SpO2 97%   BMI 33.13 kg/m   BP Readings from Last 3 Encounters:  09/27/19 (!) 153/75  08/29/19 132/70  08/12/19 (!) 165/81    Wt Readings from Last 3 Encounters:  09/27/19 205 lb 4 oz (93.1 kg)  08/29/19 204 lb 12.8 oz (92.9 kg)  08/12/19 202 lb 12.8 oz (92 kg)     Physical Exam Constitutional:  General: He is not in acute distress.    Appearance: He is well-developed.  HENT:     Head: Normocephalic and atraumatic.     Right Ear: External ear normal.     Left Ear: External ear normal.     Nose: Nose normal.  Eyes:     Conjunctiva/sclera: Conjunctivae normal.     Pupils: Pupils are equal, round, and reactive to light.  Cardiovascular:     Rate and Rhythm: Normal rate and regular rhythm.     Heart sounds: Normal heart sounds. No murmur.  Pulmonary:     Effort:  Pulmonary effort is normal. No respiratory distress.     Breath sounds: Normal breath sounds. No wheezing or rales.  Abdominal:     Palpations: Abdomen is soft.     Tenderness: There is no abdominal tenderness.  Musculoskeletal:        General: Normal range of motion.     Cervical back: Normal range of motion and neck supple.  Skin:    General: Skin is warm and dry.  Neurological:     Mental Status: He is alert and oriented to person, place, and time.     Deep Tendon Reflexes: Reflexes are normal and symmetric.  Psychiatric:        Behavior: Behavior normal.        Thought Content: Thought content normal.        Judgment: Judgment normal.     Diabetic Foot Exam - Simple   Simple Foot Form Diabetic Foot exam was performed with the following findings: Yes 09/27/2019  9:25 AM  Visual Inspection No deformities, no ulcerations, no other skin breakdown bilaterally: Yes Sensation Testing See comments: Yes Pulse Check Posterior Tibialis and Dorsalis pulse intact bilaterally: Yes Comments No sensation at tip of great toe bilaterally. Diminished at the ball of each foot.        Assessment & Plan:   Patrick Moss was seen today for medical management of chronic issues.  Diagnoses and all orders for this visit:  Class 1 obesity due to excess calories without serious comorbidity with body mass index (BMI) of 32.0 to 32.9 in adult -     CBC with Differential/Platelet -     CMP14+EGFR  Mixed hyperlipidemia -     rosuvastatin (CRESTOR) 5 MG tablet; Take 1 tablet (5 mg total) by mouth daily. -     CBC with Differential/Platelet -     CMP14+EGFR -     Lipid panel  Hypertension associated with diabetes (HCC) -     amLODipine (NORVASC) 5 MG tablet; Take 1 tablet (5 mg total) by mouth daily. -     valsartan-hydrochlorothiazide (DIOVAN HCT) 320-25 MG tablet; Take 1 tablet by mouth daily. For Blood Pressure -     CBC with Differential/Platelet -     CMP14+EGFR  Type 2 diabetes mellitus with  neurological complications (HCC) -     CBC with Differential/Platelet -     CMP14+EGFR  Lumbar spondylosis -     celecoxib (CELEBREX) 200 MG capsule; Take 1 capsule (200 mg total) by mouth daily. With food -     CBC with Differential/Platelet -     CMP14+EGFR   I have discontinued Amos L. Kiel's losartan-hydrochlorothiazide and diclofenac Sodium. I am also having him start on celecoxib and valsartan-hydrochlorothiazide. Additionally, I am having him maintain his aspirin, metFORMIN, B-D ULTRAFINE III SHORT PEN, Accu-Chek SmartView, Lantus SoloStar, Vitamin D (Ergocalciferol), rosuvastatin, and amLODipine.  Meds ordered this encounter  Medications  . rosuvastatin (CRESTOR) 5 MG tablet    Sig: Take 1 tablet (5 mg total) by mouth daily.    Dispense:  90 tablet    Refill:  1  . amLODipine (NORVASC) 5 MG tablet    Sig: Take 1 tablet (5 mg total) by mouth daily.    Dispense:  90 tablet    Refill:  1  . celecoxib (CELEBREX) 200 MG capsule    Sig: Take 1 capsule (200 mg total) by mouth daily. With food    Dispense:  30 capsule    Refill:  5  . valsartan-hydrochlorothiazide (DIOVAN HCT) 320-25 MG tablet    Sig: Take 1 tablet by mouth daily. For Blood Pressure    Dispense:  30 tablet    Refill:  2   In light of decreased sensation, we discussed diabetes foot care.  Follow-up: Return in about 6 months (around 03/28/2020).  [name], M.D.

## 2019-09-28 LAB — CBC WITH DIFFERENTIAL/PLATELET
Basophils Absolute: 0 10*3/uL (ref 0.0–0.2)
Basos: 1 %
EOS (ABSOLUTE): 0.1 10*3/uL (ref 0.0–0.4)
Eos: 2 %
Hematocrit: 46.1 % (ref 37.5–51.0)
Hemoglobin: 15.9 g/dL (ref 13.0–17.7)
Immature Grans (Abs): 0 10*3/uL (ref 0.0–0.1)
Immature Granulocytes: 0 %
Lymphocytes Absolute: 2.3 10*3/uL (ref 0.7–3.1)
Lymphs: 35 %
MCH: 30.9 pg (ref 26.6–33.0)
MCHC: 34.5 g/dL (ref 31.5–35.7)
MCV: 90 fL (ref 79–97)
Monocytes Absolute: 0.4 10*3/uL (ref 0.1–0.9)
Monocytes: 7 %
Neutrophils Absolute: 3.6 10*3/uL (ref 1.4–7.0)
Neutrophils: 55 %
Platelets: 234 10*3/uL (ref 150–450)
RBC: 5.15 x10E6/uL (ref 4.14–5.80)
RDW: 12.8 % (ref 11.6–15.4)
WBC: 6.4 10*3/uL (ref 3.4–10.8)

## 2019-09-28 LAB — CMP14+EGFR
ALT: 23 IU/L (ref 0–44)
AST: 13 IU/L (ref 0–40)
Albumin/Globulin Ratio: 1.7 (ref 1.2–2.2)
Albumin: 4.6 g/dL (ref 3.7–4.7)
Alkaline Phosphatase: 100 IU/L (ref 39–117)
BUN/Creatinine Ratio: 11 (ref 10–24)
BUN: 14 mg/dL (ref 8–27)
Bilirubin Total: 0.5 mg/dL (ref 0.0–1.2)
CO2: 21 mmol/L (ref 20–29)
Calcium: 10.1 mg/dL (ref 8.6–10.2)
Chloride: 102 mmol/L (ref 96–106)
Creatinine, Ser: 1.25 mg/dL (ref 0.76–1.27)
GFR calc Af Amer: 64 mL/min/{1.73_m2} (ref >59)
GFR calc non Af Amer: 55 mL/min/{1.73_m2} — ABNORMAL LOW (ref >59)
Globulin, Total: 2.7 g/dL (ref 1.5–4.5)
Glucose: 166 mg/dL — ABNORMAL HIGH (ref 65–99)
Potassium: 4.3 mmol/L (ref 3.5–5.2)
Sodium: 141 mmol/L (ref 134–144)
Total Protein: 7.3 g/dL (ref 6.0–8.5)

## 2019-09-28 LAB — LIPID PANEL
Chol/HDL Ratio: 3.5 ratio (ref 0.0–5.0)
Cholesterol, Total: 137 mg/dL (ref 100–199)
HDL: 39 mg/dL — ABNORMAL LOW (ref >39)
LDL Chol Calc (NIH): 70 mg/dL (ref 0–99)
Triglycerides: 166 mg/dL — ABNORMAL HIGH (ref 0–149)
VLDL Cholesterol Cal: 28 mg/dL (ref 5–40)

## 2019-09-28 NOTE — Progress Notes (Signed)
Hello Patrick Moss,  Your lab result is normal and/or stable.Some minor variations that are not significant are commonly marked abnormal, but do not represent any medical problem for you.  Best regards, Paizley Ramella, M.D.

## 2019-09-30 ENCOUNTER — Ambulatory Visit (INDEPENDENT_AMBULATORY_CARE_PROVIDER_SITE_OTHER): Payer: Medicare HMO | Admitting: *Deleted

## 2019-09-30 DIAGNOSIS — Z0000 Encounter for general adult medical examination without abnormal findings: Secondary | ICD-10-CM

## 2019-09-30 NOTE — Progress Notes (Signed)
MEDICARE ANNUAL WELLNESS VISIT  09/30/2019  Telephone Visit Disclaimer This Medicare AWV was conducted by telephone due to national recommendations for restrictions regarding the COVID-19 Pandemic (e.g. social distancing).  I verified, using two identifiers, that I am speaking with Patrick Moss or their authorized healthcare agent. I discussed the limitations, risks, security, and privacy concerns of performing an evaluation and management service by telephone and the potential availability of an in-person appointment in the future. The patient expressed understanding and agreed to proceed.   Subjective:  Patrick Moss is a 79 y.o. male patient of [name], [name], MD who had a Medicare Annual Wellness Visit today via telephone. Patrick Moss is Retired and lives alone. Patrick Moss has 2 children. Patrick Moss reports that Patrick Moss is socially active and does interact with friends/family regularly. Patrick Moss is minimally physically active and enjoys woodworking.  Patient Care Team: [name], MD as PCP - General (Family Medicine)  Advanced Directives 09/30/2019 09/18/2014  Does Patient Have a Medical Advance Directive? Yes No  Type of Paramedic of [place];Living will -  Does patient want to make changes to medical advance directive? No - Patient declined -  Copy of [place] in Chart? No - copy requested -  Would patient like information on creating a medical advance directive? - No - patient declined information    Hospital Utilization Over the Past 12 Months: # of hospitalizations or ER visits: 0 # of surgeries: 0  Review of Systems    Patient reports that his overall health is unchanged compared to last year.  History obtained from chart review and the patient General ROS: negative  Patient Reported Readings (BP, Pulse, CBG, Weight, etc) none  Pain Assessment Pain : No/denies pain     Current Medications & Allergies (verified) Allergies as of 09/30/2019     Reactions   Sulfamethoxazole-trimethoprim Hives      Medication List       Accurate as of September 30, 2019  8:56 AM. If you have any questions, ask your nurse or doctor.        Accu-Chek SmartView test strip Generic drug: glucose blood TEST BLOOD SUGAR TWO TO THREE TIMES DAILY AS DIRECTED   amLODipine 5 MG tablet Commonly known as: NORVASC Take 1 tablet (5 mg total) by mouth daily.   aspirin 81 MG tablet Take 81 mg by mouth daily.   B-D ULTRAFINE III SHORT PEN 31G X 8 MM Misc Generic drug: Insulin Pen Needle USE  1  PEN  NEEDLE AS DIRECTED   celecoxib 200 MG capsule Commonly known as: CeleBREX Take 1 capsule (200 mg total) by mouth daily. With food   Lantus SoloStar 100 UNIT/ML Solostar Pen Generic drug: insulin glargine INJECT 70 UNITS INTO THE SKIN AT BEDTIME (DISCARD USED PEN AFTER 28 DAYS)   metFORMIN 500 MG 24 hr tablet Commonly known as: GLUCOPHAGE-XR Take 1,000 mg by mouth daily after breakfast.   rosuvastatin 5 MG tablet Commonly known as: CRESTOR Take 1 tablet (5 mg total) by mouth daily.   valsartan-hydrochlorothiazide 320-25 MG tablet Commonly known as: Diovan HCT Take 1 tablet by mouth daily. For Blood Pressure   Vitamin D (Ergocalciferol) 1.25 MG (50000 UNIT) Caps capsule Commonly known as: DRISDOL Take 1 capsule (50,000 Units total) by mouth every 7 (seven) days.       History (reviewed): Past Medical History:  Diagnosis Date  . Diabetes mellitus without complication ([place])   . Hyperlipidemia   . Hypertension  Past Surgical History:  Procedure Laterality Date  . R foot fx     Family History  Problem Relation Age of Onset  . Diabetes Mother   . Diabetes Father   . Diabetes Brother    Social History   Socioeconomic History  . Marital status: Divorced    Spouse name: Not on file  . Number of children: 2  . Years of education: 10  . Highest education level: 10th grade  Occupational History  . Not on file  Tobacco Use  .  Smoking status: Never Smoker  . Smokeless tobacco: Never Used  Substance and Sexual Activity  . Alcohol use: No    Alcohol/week: 0.0 standard drinks  . Drug use: No  . Sexual activity: Not Currently  Other Topics Concern  . Not on file  Social History Narrative  . Not on file   Social Determinants of Health   Financial Resource Strain: Low Risk   . Difficulty of Paying Living Expenses: Not hard at all  Food Insecurity: No Food Insecurity  . Worried About Charity fundraiser in the Last Year: Never true  . Ran Out of Food in the Last Year: Never true  Transportation Needs: No Transportation Needs  . Lack of Transportation (Medical): No  . Lack of Transportation (Non-Medical): No  Physical Activity: Inactive  . Days of Exercise per Week: 0 days  . Minutes of Exercise per Session: 0 min  Stress: No Stress Concern Present  . Feeling of Stress : Not at all  Social Connections: Slightly Isolated  . Frequency of Communication with Friends and Family: More than three times a week  . Frequency of Social Gatherings with Friends and Family: More than three times a week  . Attends Religious Services: More than 4 times per year  . Active Member of Clubs or Organizations: Yes  . Attends Archivist Meetings: More than 4 times per year  . Marital Status: Divorced    Activities of Daily Living In your present state of health, do you have any difficulty performing the following activities: 09/30/2019  Hearing? N  Vision? Y  Difficulty concentrating or making decisions? Y  Walking or climbing stairs? Y  Dressing or bathing? N  Doing errands, shopping? N  Preparing Food and eating ? N  Using the Toilet? N  In the past six months, have you accidently leaked urine? N  Do you have problems with loss of bowel control? N  Managing your Medications? N  Managing your Finances? N  Housekeeping or managing your Housekeeping? N  Some recent data might be hidden    Patient Education/  Literacy How often do you need to have someone help you when you read instructions, pamphlets, or other written materials from your doctor or pharmacy?: 1 - Never What is the last grade level you completed in school?: 10th Grade  Exercise Current Exercise Habits: The patient does not participate in regular exercise at present, Exercise limited by: None identified  Diet Patient reports consuming 3 meals a day and 1 snack(s) a day Patient reports that his primary diet is: Regular Patient reports that she does have regular access to food.   Depression Screen PHQ 2/9 Scores 09/30/2019 09/27/2019 03/29/2019 10/22/2017 04/21/2017 10/14/2016 07/14/2016  PHQ - 2 Score 0 0 0 0 0 0 0     Fall Risk Fall Risk  09/30/2019 09/27/2019 08/29/2019 03/29/2019 10/22/2017  Falls in the past year? 0 0 0 0 No  Number falls  in past yr: 0 - - - -  Injury with Fall? 0 - - - -  Risk for fall due to : No Fall Risks - - - -  Follow up Falls evaluation completed - - - -     Objective:  Patrick Moss seemed alert and oriented and Patrick Moss participated appropriately during our telephone visit.  Blood Pressure Weight BMI  BP Readings from Last 3 Encounters:  09/27/19 (!) 153/75  08/29/19 132/70  08/12/19 (!) 165/81   Wt Readings from Last 3 Encounters:  09/27/19 205 lb 4 oz (93.1 kg)  08/29/19 204 lb 12.8 oz (92.9 kg)  08/12/19 202 lb 12.8 oz (92 kg)   BMI Readings from Last 1 Encounters:  09/27/19 33.13 kg/m    *Unable to obtain current vital signs, weight, and BMI due to telephone visit type  Hearing/Vision  . Patrick Moss did not seem to have difficulty with hearing/understanding during the telephone conversation . Reports that Patrick Moss has had a formal eye exam by an eye care professional within the past year . Reports that Patrick Moss has not had a formal hearing evaluation within the past year *Unable to fully assess hearing and vision during telephone visit type  Cognitive Function: 6CIT Screen 09/30/2019  What Year? 0 points    What month? 0 points  What time? 0 points  Count back from 20 0 points  Months in reverse 0 points  Repeat phrase 2 points  Total Score 2   (Normal:0-7, Significant for Dysfunction: >8)  Normal Cognitive Function Screening: Yes   Immunization & Health Maintenance Record Immunization History  Administered Date(s) Administered  . Influenza, High Dose Seasonal PF 03/28/2015, 04/07/2016, 04/30/2016, 03/20/2017, 03/17/2018, 03/07/2019  . Pneumococcal Conjugate-13 06/30/2016  . Pneumococcal Polysaccharide-23 07/15/2017    Health Maintenance  Topic Date Due  . OPHTHALMOLOGY EXAM  Never done  . TETANUS/TDAP  03/28/2020 (Originally 02/28/1960)  . INFLUENZA VACCINE  01/15/2020  . HEMOGLOBIN A1C  02/09/2020  . FOOT EXAM  09/26/2020  . PNA vac Low Risk Adult  Completed       Assessment  This is a routine wellness examination for Patrick Moss.  Health Maintenance: Due or Overdue Health Maintenance Due  Topic Date Due  . OPHTHALMOLOGY EXAM  Never done    Patrick Moss does not need a referral for Community Assistance: Care Management:   no Social Work:    no Prescription Assistance:  no Nutrition/Diabetes Education:  no   Plan:  Personalized Goals Goals Addressed            This Visit's Progress   . Exercise 3x per week (30 min per time)       09/30/2019 AWV Goal: Exercise for General Health   Patient will verbalize understanding of the benefits of increased physical activity:  Exercising regularly is important. It will improve your overall fitness, flexibility, and endurance.  Regular exercise also will improve your overall health. It can help you control your weight, reduce stress, and improve your bone density.  Over the next year, patient will increase physical activity as tolerated with a goal of at least 150 minutes of moderate physical activity per week.   You can tell that you are exercising at a moderate intensity if your heart starts beating faster  and you start breathing faster but can still hold a conversation.  Moderate-intensity exercise ideas include:  Walking 1 mile (1.6 km) in about 15 minutes  Biking  Hiking  Golfing  Dancing  Water aerobics  Patient will verbalize understanding of everyday activities that increase physical activity by providing examples like the following: ? Yard work, such as: ? Pushing a Conservation officer, nature ? Raking and bagging leaves ? Washing your car ? Pushing a stroller ? Shoveling snow ? Gardening ? Washing windows or floors  Patient will be able to explain general safety guidelines for exercising:   Before you start a new exercise program, talk with your health care provider.  Do not exercise so much that you hurt yourself, feel dizzy, or get very short of breath.  Wear comfortable clothes and wear shoes with good support.  Drink plenty of water while you exercise to prevent dehydration or heat stroke.  Work out until your breathing and your heartbeat get faster.       Personalized Health Maintenance & Screening Recommendations  Td vaccine Glaucoma screening Shingrix  Lung Cancer Screening Recommended: no (Low Dose CT Chest recommended if Age 43-80 years, 30 pack-year currently smoking OR have quit w/in past 15 years) Hepatitis C Screening recommended: no HIV Screening recommended: no  Advanced Directives: Written information was not prepared per patient's request.  Referrals & Orders No orders of the defined types were placed in this encounter.   Follow-up Plan . Follow-up with [name], MD as planned    I have personally reviewed and noted the following in the patient's chart:   . Medical and social history . Use of alcohol, tobacco or illicit drugs  . Current medications and supplements . Functional ability and status . Nutritional status . Physical activity . Advanced directives . List of other physicians . Hospitalizations, surgeries, and ER visits in  previous 12 months . Vitals . Screenings to include cognitive, depression, and falls . Referrals and appointments  In addition, I have reviewed and discussed with Patrick Moss certain preventive protocols, quality metrics, and best practice recommendations. A written personalized care plan for preventive services as well as general preventive health recommendations is available and can be mailed to the patient at his request.      Patrick Heath, LPN  [id number]    AVS printed and mailed to patient

## 2019-09-30 NOTE — Patient Instructions (Signed)
  [place] Maintenance Summary and Written Plan of Care  [name] ,  Thank you for allowing me to perform your Medicare Annual Wellness Visit and for your ongoing commitment to your health.   Health Maintenance & Immunization History Health Maintenance  Topic Date Due  . OPHTHALMOLOGY EXAM  Never done  . TETANUS/TDAP  03/28/2020 (Originally 02/28/1960)  . INFLUENZA VACCINE  01/15/2020  . HEMOGLOBIN A1C  02/09/2020  . FOOT EXAM  09/26/2020  . PNA vac Low Risk Adult  Completed   Immunization History  Administered Date(s) Administered  . Influenza, High Dose Seasonal PF 03/28/2015, 04/07/2016, 04/30/2016, 03/20/2017, 03/17/2018, 03/07/2019  . Pneumococcal Conjugate-13 06/30/2016  . Pneumococcal Polysaccharide-23 07/15/2017    These are the patient goals that we discussed: Goals Addressed            This Visit's Progress   . Exercise 3x per week (30 min per time)       09/30/2019 AWV Goal: Exercise for General Health   Patient will verbalize understanding of the benefits of increased physical activity:  Exercising regularly is important. It will improve your overall fitness, flexibility, and endurance.  Regular exercise also will improve your overall health. It can help you control your weight, reduce stress, and improve your bone density.  Over the next year, patient will increase physical activity as tolerated with a goal of at least 150 minutes of moderate physical activity per week.   You can tell that you are exercising at a moderate intensity if your heart starts beating faster and you start breathing faster but can still hold a conversation.  Moderate-intensity exercise ideas include:  Walking 1 mile (1.6 km) in about 15 minutes  Biking  Hiking  Golfing  Dancing  Water aerobics  Patient will verbalize understanding of everyday activities that increase physical activity by providing examples like the following: ? Yard work,  such as: ? Pushing a Conservation officer, nature ? Raking and bagging leaves ? Washing your car ? Pushing a stroller ? Shoveling snow ? Gardening ? Washing windows or floors  Patient will be able to explain general safety guidelines for exercising:   Before you start a new exercise program, talk with your health care provider.  Do not exercise so much that you hurt yourself, feel dizzy, or get very short of breath.  Wear comfortable clothes and wear shoes with good support.  Drink plenty of water while you exercise to prevent dehydration or heat stroke.  Work out until your breathing and your heartbeat get faster.         This is a list of Health Maintenance Items that are overdue or due now: Health Maintenance Due  Topic Date Due  . OPHTHALMOLOGY EXAM  Never done     Orders/Referrals Placed Today: No orders of the defined types were placed in this encounter.  (Contact our referral department at [phone] if you have not spoken with someone about your referral appointment within the next 5 days)    Follow-up Plan Follow up with Dr. Livia Snellen as planned

## 2019-10-05 ENCOUNTER — Telehealth: Payer: Self-pay | Admitting: Family Medicine

## 2019-10-05 NOTE — Telephone Encounter (Signed)
Pt aware - per notes he will stay on both the amlodipine and the valsartan -hctz

## 2019-10-07 ENCOUNTER — Other Ambulatory Visit: Payer: Self-pay | Admitting: *Deleted

## 2019-10-07 DIAGNOSIS — E1159 Type 2 diabetes mellitus with other circulatory complications: Secondary | ICD-10-CM

## 2019-10-07 MED ORDER — VALSARTAN-HYDROCHLOROTHIAZIDE 320-25 MG PO TABS: 1.0000 | ORAL_TABLET | Freq: Every day | ORAL | 0 refills | Status: DC

## 2019-10-19 ENCOUNTER — Other Ambulatory Visit: Payer: Self-pay | Admitting: *Deleted

## 2019-10-19 DIAGNOSIS — M47816 Spondylosis without myelopathy or radiculopathy, lumbar region: Secondary | ICD-10-CM

## 2019-10-19 MED ORDER — CELECOXIB 200 MG PO CAPS: 200.0000 mg | ORAL_CAPSULE | Freq: Every day | ORAL | 1 refills | Status: DC

## 2019-12-02 ENCOUNTER — Other Ambulatory Visit: Payer: Self-pay

## 2019-12-02 ENCOUNTER — Other Ambulatory Visit: Payer: Medicare HMO

## 2019-12-02 DIAGNOSIS — E1165 Type 2 diabetes mellitus with hyperglycemia: Secondary | ICD-10-CM

## 2019-12-02 DIAGNOSIS — I10 Essential (primary) hypertension: Secondary | ICD-10-CM

## 2019-12-02 DIAGNOSIS — E559 Vitamin D deficiency, unspecified: Secondary | ICD-10-CM | POA: Diagnosis not present

## 2019-12-02 DIAGNOSIS — E6609 Other obesity due to excess calories: Secondary | ICD-10-CM

## 2019-12-03 LAB — MICROALBUMIN / CREATININE URINE RATIO
Creatinine, Urine: 196.7 mg/dL
Microalb/Creat Ratio: 21 mg/g creat (ref 0–29)
Microalbumin, Urine: 41.3 ug/mL

## 2019-12-03 LAB — COMPREHENSIVE METABOLIC PANEL
ALT: 32 IU/L (ref 0–44)
AST: 20 IU/L (ref 0–40)
Albumin/Globulin Ratio: 2 (ref 1.2–2.2)
Albumin: 4.5 g/dL (ref 3.7–4.7)
Alkaline Phosphatase: 78 IU/L (ref 48–121)
BUN/Creatinine Ratio: 10 (ref 10–24)
BUN: 13 mg/dL (ref 8–27)
Bilirubin Total: 0.6 mg/dL (ref 0.0–1.2)
CO2: 22 mmol/L (ref 20–29)
Calcium: 9.6 mg/dL (ref 8.6–10.2)
Chloride: 103 mmol/L (ref 96–106)
Creatinine, Ser: 1.24 mg/dL (ref 0.76–1.27)
GFR calc Af Amer: 64 mL/min/{1.73_m2} (ref >59)
GFR calc non Af Amer: 56 mL/min/{1.73_m2} — ABNORMAL LOW (ref >59)
Globulin, Total: 2.2 g/dL (ref 1.5–4.5)
Glucose: 135 mg/dL — ABNORMAL HIGH (ref 65–99)
Potassium: 4.5 mmol/L (ref 3.5–5.2)
Sodium: 143 mmol/L (ref 134–144)
Total Protein: 6.7 g/dL (ref 6.0–8.5)

## 2019-12-03 LAB — T4, FREE: Free T4: 1.41 ng/dL (ref 0.82–1.77)

## 2019-12-03 LAB — VITAMIN D 25 HYDROXY (VIT D DEFICIENCY, FRACTURES): Vit D, 25-Hydroxy: 31.6 ng/mL (ref 30.0–100.0)

## 2019-12-03 LAB — TSH: TSH: 3.61 u[IU]/mL (ref 0.450–4.500)

## 2019-12-12 ENCOUNTER — Encounter: Payer: Self-pay | Admitting: "Endocrinology

## 2019-12-12 ENCOUNTER — Other Ambulatory Visit: Payer: Self-pay

## 2019-12-12 ENCOUNTER — Ambulatory Visit (INDEPENDENT_AMBULATORY_CARE_PROVIDER_SITE_OTHER): Payer: Medicare HMO | Admitting: "Endocrinology

## 2019-12-12 VITALS — BP 148/71 | HR 80 | Ht 66.0 in | Wt 205.8 lb

## 2019-12-12 DIAGNOSIS — I10 Essential (primary) hypertension: Secondary | ICD-10-CM

## 2019-12-12 DIAGNOSIS — E1165 Type 2 diabetes mellitus with hyperglycemia: Secondary | ICD-10-CM

## 2019-12-12 DIAGNOSIS — E782 Mixed hyperlipidemia: Secondary | ICD-10-CM

## 2019-12-12 DIAGNOSIS — E559 Vitamin D deficiency, unspecified: Secondary | ICD-10-CM | POA: Diagnosis not present

## 2019-12-12 LAB — POCT GLYCOSYLATED HEMOGLOBIN (HGB A1C): Hemoglobin A1C: 7.9 % — AB (ref 4.0–5.6)

## 2019-12-12 MED ORDER — LANTUS SOLOSTAR 100 UNIT/ML ~~LOC~~ SOPN: PEN_INJECTOR | SUBCUTANEOUS | 1 refills | Status: DC

## 2019-12-12 NOTE — Patient Instructions (Signed)

## 2019-12-12 NOTE — Progress Notes (Signed)
04/07/2019       Endocrinology follow-up note   Subjective:    Patient ID: Patrick Moss, male    DOB: [birth year], PCP Patrick Fraise, MD   Past Medical History:  Diagnosis Date  . Diabetes mellitus without complication ([place])   . Hyperlipidemia   . Hypertension    Past Surgical History:  Procedure Laterality Date  . R foot fx     Social History   Socioeconomic History  . Marital status: Divorced    Spouse name: Not on file  . Number of children: 2  . Years of education: 10  . Highest education level: 10th grade  Occupational History  . Not on file  Tobacco Use  . Smoking status: Never Smoker  . Smokeless tobacco: Never Used  Vaping Use  . Vaping Use: Never used  Substance and Sexual Activity  . Alcohol use: No    Alcohol/week: 0.0 standard drinks  . Drug use: No  . Sexual activity: Not Currently  Other Topics Concern  . Not on file  Social History Narrative  . Not on file   Social Determinants of Health   Financial Resource Strain: Low Risk   . Difficulty of Paying Living Expenses: Not hard at all  Food Insecurity: No Food Insecurity  . Worried About Charity fundraiser in the Last Year: Never true  . Ran Out of Food in the Last Year: Never true  Transportation Needs: No Transportation Needs  . Lack of Transportation (Medical): No  . Lack of Transportation (Non-Medical): No  Physical Activity: Inactive  . Days of Exercise per Week: 0 days  . Minutes of Exercise per Session: 0 min  Stress: No Stress Concern Present  . Feeling of Stress : Not at all  Social Connections: Moderately Integrated  . Frequency of Communication with Friends and Family: More than three times a week  . Frequency of Social Gatherings with Friends and Family: More than three times a week  . Attends Religious Services: More than 4 times per year  . Active Member of Clubs or Organizations: Yes  . Attends Archivist Meetings: More than 4 times per year  . Marital Status:  Divorced   Outpatient Encounter Medications as of 12/12/2019  Medication Sig  . ACCU-CHEK SMARTVIEW test strip TEST BLOOD SUGAR TWO TO THREE TIMES DAILY AS DIRECTED  . amLODipine (NORVASC) 5 MG tablet Take 1 tablet (5 mg total) by mouth daily.  Marland Kitchen aspirin 81 MG tablet Take 81 mg by mouth daily.  . B-D ULTRAFINE III SHORT PEN 31G X 8 MM MISC USE  1  PEN  NEEDLE AS DIRECTED  . celecoxib (CELEBREX) 200 MG capsule Take 1 capsule (200 mg total) by mouth daily. With food  . insulin glargine (LANTUS SOLOSTAR) 100 UNIT/ML Solostar Pen INJECT 80 UNITS INTO THE SKIN AT BEDTIME (DISCARD USED PEN AFTER 28 DAYS)  . metFORMIN (GLUCOPHAGE-XR) 500 MG 24 hr tablet Take 1,000 mg by mouth daily after breakfast.  . rosuvastatin (CRESTOR) 5 MG tablet Take 1 tablet (5 mg total) by mouth daily.  . valsartan-hydrochlorothiazide (DIOVAN HCT) 320-25 MG tablet Take 1 tablet by mouth daily. For Blood Pressure  . Vitamin D, Ergocalciferol, (DRISDOL) 1.25 MG (50000 UNIT) CAPS capsule Take 1 capsule (50,000 Units total) by mouth every 7 (seven) days.  . [DISCONTINUED] Insulin Glargine (LANTUS SOLOSTAR) 100 UNIT/ML Solostar Pen INJECT 70 UNITS INTO THE SKIN AT BEDTIME (DISCARD USED PEN AFTER 28 DAYS)   No facility-administered encounter  medications on file as of 12/12/2019.   ALLERGIES: Allergies  Allergen Reactions  . Sulfamethoxazole-Trimethoprim Hives   VACCINATION STATUS: Immunization History  Administered Date(s) Administered  . Influenza, High Dose Seasonal PF 03/28/2015, 04/07/2016, 04/30/2016, 03/20/2017, 03/17/2018, 03/07/2019  . Pneumococcal Conjugate-13 06/30/2016  . Pneumococcal Polysaccharide-23 07/15/2017    Diabetes He presents for his follow-up diabetic visit. He has type 2 diabetes mellitus. Onset time: He was diagnosed at approximate age of 56 years. His disease course has been fluctuating. There are no hypoglycemic associated symptoms. Pertinent negatives for hypoglycemia include no confusion,  headaches, pallor or seizures. Associated symptoms include polydipsia and polyuria. Pertinent negatives for diabetes include no chest pain, no fatigue, no polyphagia and no weakness. There are no hypoglycemic complications. Symptoms are improving. There are no diabetic complications. Risk factors for coronary artery disease include diabetes mellitus, dyslipidemia, male sex, obesity and sedentary lifestyle. He is compliant with treatment most of the time. His weight is fluctuating minimally. He is following a generally unhealthy diet. He participates in exercise intermittently. His home blood glucose trend is fluctuating minimally. His breakfast blood glucose range is generally 130-140 mg/dl. His bedtime blood glucose range is generally 140-180 mg/dl. His overall blood glucose range is 140-180 mg/dl. (Presents with slightly above target glycemic profile both fasting and postprandial, point-of-care A1c of 7.9%.  He did not document or report hypoglycemia. ) An ACE inhibitor/angiotensin II receptor blocker is being taken.  Hyperlipidemia This is a chronic problem. The current episode started more than 1 year ago. The problem is uncontrolled. Exacerbating diseases include diabetes and obesity. Pertinent negatives include no chest pain, myalgias or shortness of breath. Current antihyperlipidemic treatment includes statins. Risk factors for coronary artery disease include dyslipidemia, hypertension, male sex, obesity and a sedentary lifestyle.  Hypertension This is a chronic problem. The current episode started more than 1 year ago. The problem is uncontrolled. Pertinent negatives include no chest pain, headaches, neck pain, palpitations or shortness of breath. Risk factors for coronary artery disease include dyslipidemia, diabetes mellitus, obesity and sedentary lifestyle. Past treatments include ACE inhibitors and angiotensin blockers.    Review of systems  Constitutional: + Minimally fluctuating body weight,   current  Body mass index is 33.22 kg/m. , no fatigue, no subjective hyperthermia, no subjective hypothermia Eyes: no blurry vision, no xerophthalmia ENT: no sore throat, no nodules palpated in throat, no dysphagia/odynophagia, no hoarseness Cardiovascular: no Chest Pain, no Shortness of Breath, no palpitations, no leg swelling Respiratory: no cough, no shortness of breath Gastrointestinal: no Nausea/Vomiting/Diarhhea Musculoskeletal: no muscle/joint aches Skin: no rashes, no hyperemia Neurological: no tremors, no numbness, no tingling, no dizziness Psychiatric: no depression, no anxiety   Objective:    BP (!) 148/71   Pulse 80   Ht 5' 6"  (1.676 m)   Wt 205 lb 12.8 oz (93.4 kg)   BMI 33.22 kg/m   Wt Readings from Last 3 Encounters:  12/12/19 205 lb 12.8 oz (93.4 kg)  09/27/19 205 lb 4 oz (93.1 kg)  08/29/19 204 lb 12.8 oz (92.9 kg)      Physical Exam- Limited  Constitutional:  Body mass index is 33.22 kg/m. , not in acute distress, normal state of mind Eyes:  EOMI, no exophthalmos Neck: Supple Thyroid: No gross goiter Respiratory: Adequate breathing efforts Musculoskeletal: no gross deformities, strength intact in all four extremities, no gross restriction of joint movements Skin:  no rashes, no hyperemia Neurological: no tremor with outstretched hands   Recent Results (from the past 2160 hour(s))  HM DIABETES EYE EXAM     Status: None   Collection Time: 09/15/19 12:00 AM  Result Value Ref Range   HM Diabetic Eye Exam No Retinopathy No Retinopathy    Comment: Warden Fillers, MD  CBC with Differential/Platelet     Status: None   Collection Time: 09/27/19  9:35 AM  Result Value Ref Range   WBC 6.4 3.4 - 10.8 x10E3/uL   RBC 5.15 4.14 - 5.80 x10E6/uL   Hemoglobin 15.9 13.0 - 17.7 g/dL   Hematocrit 46.1 37.5 - 51.0 %   MCV 90 79 - 97 fL   MCH 30.9 26.6 - 33.0 pg   MCHC 34.5 31 - 35 g/dL   RDW 12.8 11.6 - 15.4 %   Platelets 234 150 - 450 x10E3/uL   Neutrophils  55 Not Estab. %   Lymphs 35 Not Estab. %   Monocytes 7 Not Estab. %   Eos 2 Not Estab. %   Basos 1 Not Estab. %   Neutrophils Absolute 3.6 1 - 7 x10E3/uL   Lymphocytes Absolute 2.3 0 - 3 x10E3/uL   Monocytes Absolute 0.4 0 - 0 x10E3/uL   EOS (ABSOLUTE) 0.1 0.0 - 0.4 x10E3/uL   Basophils Absolute 0.0 0 - 0 x10E3/uL   Immature Granulocytes 0 Not Estab. %   Immature Grans (Abs) 0.0 0.0 - 0.1 x10E3/uL  CMP14+EGFR     Status: Abnormal   Collection Time: 09/27/19  9:35 AM  Result Value Ref Range   Glucose 166 (H) 65 - 99 mg/dL   BUN 14 8 - 27 mg/dL   Creatinine, Ser 1.25 0.76 - 1.27 mg/dL   GFR calc non Af Amer 55 (L) >59 mL/min/1.73   GFR calc Af Amer 64 >59 mL/min/1.73   BUN/Creatinine Ratio 11 10 - 24   Sodium 141 134 - 144 mmol/L   Potassium 4.3 3.5 - 5.2 mmol/L   Chloride 102 96 - 106 mmol/L   CO2 21 20 - 29 mmol/L   Calcium 10.1 8.6 - 10.2 mg/dL   Total Protein 7.3 6.0 - 8.5 g/dL   Albumin 4.6 3.7 - 4.7 g/dL   Globulin, Total 2.7 1.5 - 4.5 g/dL   Albumin/Globulin Ratio 1.7 1.2 - 2.2   Bilirubin Total 0.5 0.0 - 1.2 mg/dL   Alkaline Phosphatase 100 39 - 117 IU/L   AST 13 0 - 40 IU/L   ALT 23 0 - 44 IU/L  Lipid panel     Status: Abnormal   Collection Time: 09/27/19  9:35 AM  Result Value Ref Range   Cholesterol, Total 137 100 - 199 mg/dL   Triglycerides 166 (H) 0 - 149 mg/dL   HDL 39 (L) >39 mg/dL   VLDL Cholesterol Cal 28 5 - 40 mg/dL   LDL Chol Calc (NIH) 70 0 - 99 mg/dL   Chol/HDL Ratio 3.5 0.0 - 5.0 ratio    Comment:                                   T. Chol/HDL Ratio                                             Men  Women  1/2 Avg.Risk  3.4    3.3                                   Avg.Risk  5.0    4.4                                2X Avg.Risk  9.6    7.1                                3X Avg.Risk 23.4   11.0   TSH     Status: None   Collection Time: 12/02/19  9:10 AM  Result Value Ref Range   TSH 3.610 0.450 - 4.500 uIU/mL  T4, Free      Status: None   Collection Time: 12/02/19  9:10 AM  Result Value Ref Range   Free T4 1.41 0.82 - 1.77 ng/dL  VITAMIN D 25 Hydroxy (Vit-D Deficiency, Fractures)     Status: None   Collection Time: 12/02/19  9:10 AM  Result Value Ref Range   Vit D, 25-Hydroxy 31.6 30.0 - 100.0 ng/mL    Comment: Vitamin D deficiency has been defined by the [place] and an Endocrine Society practice guideline as a level of serum 25-OH vitamin D less than 20 ng/mL (1,2). The Endocrine Society went on to further define vitamin D insufficiency as a level between 21 and 29 ng/mL (2). 1. IOM (Institute of Medicine). 2010. Dietary reference    intakes for calcium and D. [place]: The    [employer]. 2. Holick MF, Binkley Seymour, Bischoff-Ferrari HA, et al.    Evaluation, treatment, and prevention of vitamin D    deficiency: an Endocrine Society clinical practice    guideline. JCEM. 2011 Jul; 96(7):1911-30.   Comprehensive metabolic panel     Status: Abnormal   Collection Time: 12/02/19  9:10 AM  Result Value Ref Range   Glucose 135 (H) 65 - 99 mg/dL   BUN 13 8 - 27 mg/dL   Creatinine, Ser 1.24 0.76 - 1.27 mg/dL   GFR calc non Af Amer 56 (L) >59 mL/min/1.73   GFR calc Af Amer 64 >59 mL/min/1.73    Comment: **[facility] currently reports eGFR in compliance with the current**   recommendations of the [employer]. [facility] will   update reporting as new guidelines are published from the NKF-ASN   Task force.    BUN/Creatinine Ratio 10 10 - 24   Sodium 143 134 - 144 mmol/L   Potassium 4.5 3.5 - 5.2 mmol/L   Chloride 103 96 - 106 mmol/L   CO2 22 20 - 29 mmol/L   Calcium 9.6 8.6 - 10.2 mg/dL   Total Protein 6.7 6.0 - 8.5 g/dL   Albumin 4.5 3.7 - 4.7 g/dL   Globulin, Total 2.2 1.5 - 4.5 g/dL   Albumin/Globulin Ratio 2.0 1.2 - 2.2   Bilirubin Total 0.6 0.0 - 1.2 mg/dL   Alkaline Phosphatase 78 48 - 121 IU/L   AST 20 0 - 40 IU/L   ALT 32 0 - 44 IU/L   Microalbumin/Creatinine Ratio, Urine     Status: None   Collection Time: 12/02/19  9:10 AM  Result Value Ref Range   Creatinine, Urine 196.7 Not Estab. mg/dL   Microalbumin,  Urine 41.3 Not Estab. ug/mL   Microalb/Creat Ratio 21 0 - 29 mg/g creat    Comment:                        Normal:                0 -  29                        Moderately increased: 30 - 300                        Severely increased:       >300   HgB A1c     Status: Abnormal   Collection Time: 12/12/19  9:34 AM  Result Value Ref Range   Hemoglobin A1C 7.9 (A) 4.0 - 5.6 %   HbA1c POC (<> result, manual entry)     HbA1c, POC (prediabetic range)     HbA1c, POC (controlled diabetic range)      Assessment & Plan:   1. Uncontrolled type 2 diabetes mellitus without complication, without long-term current use of insulin (HCC)   Pt remains at a high risk for more acute and chronic complications of diabetes which include CAD, CVA, CKD, retinopathy, and neuropathy. These are all discussed in detail with the patient.  Presents with slightly above target glycemic profile both fasting and postprandial, point-of-care A1c of 7.9%.  He did not document or report hypoglycemia.  He anticipates busy yardwork season coming up.   -  Recent labs reviewed. - I have re-counseled the patient on diet management and weight loss  by adopting a carbohydrate restricted / protein rich  Diet.  - he  admits there is a room for improvement in his diet and drink choices. -  Suggestion is made for him to avoid simple carbohydrates  from his diet including Cakes, Sweet Desserts / Pastries, Ice Cream, Soda (diet and regular), Sweet Tea, Candies, Chips, Cookies, Sweet Pastries,  Store Bought Juices, Alcohol in Excess of  1-2 drinks a day, Artificial Sweeteners, Coffee Creamer, and "Sugar-free" Products. This will help patient to have stable blood glucose profile and potentially avoid unintended weight gain.  - Patient is advised to stick to a  routine mealtimes to eat 3 meals  a day and avoid unnecessary snacks ( to snack only to correct hypoglycemia).  - I have approached patient with the following individualized plan to manage diabetes and patient agrees.   -Based on his presentation with above target glycemic profile, he will tolerate  higher dose of basal insulin.   -I discussed and increase his Lantus to 80 units nightly,  associated with strict monitoring of blood glucose 2 times a day-daily before breakfast and at bedtime. -Sample pens of Toujeo max were given to him from clinic.  He is advised to continue metformin 1000 mg ER p.o. daily after breakfast.    -He expresses concern on cost of insulin, will be considered for either Novolin 70/30 to use twice a day .  He has documented allergy to sulfa-based medications, not a candidate for glipizide.    - Patient specific target  for A1c; LDL, HDL, Triglycerides were discussed in detail.  2) BP/HTN:  -His blood pressure is not controlled to target.    He is advised to continue his current blood pressure medications including amlodipine 5 mg p.o. daily, Hyzaar 100-25 mg p.o. daily.   3)  Lipids/HPL: His recent lipid panel showed controlled LDL at 57, uncontrolled triglyceride 246.  He is advised to continue Crestor 5 mg p.o. nightly.  Side effects and precautions discussed with him.     4)  Weight/Diet: His current BMI 33.2, a candidate for some weight loss.  No success and weight loss, CDE consult in progress, exercise, and carbohydrates information provided.  5) Chronic Care/Health Maintenance:  -Patient is on ACEI/ARB and Statin medications and encouraged to continue to follow up with Ophthalmology, Podiatrist at least yearly or according to recommendations, and advised to  stay away from smoking. I have recommended yearly flu vaccine and pneumonia vaccination at least every 5 years; moderate intensity exercise for up to 150 minutes weekly; and  sleep for at least 7 hours a  day.  - I advised patient to maintain close follow up with Patrick Fraise, MD for primary care needs.  - Time spent on this patient care encounter:  35 min, of which > 50% was spent in  counseling and the rest reviewing his blood glucose logs , discussing his hypoglycemia and hyperglycemia episodes, reviewing his current and  previous labs / studies  ( including abstraction from other facilities) and medications  doses and developing a  long term treatment plan and documenting his care.   Please refer to Patient Instructions for Blood Glucose Monitoring and Insulin/Medications Dosing Guide"  in media tab for additional information. Please  also refer to " Patient Self Inventory" in the Media  tab for reviewed elements of pertinent patient history.  Patrick Moss participated in the discussions, expressed understanding, and voiced agreement with the above plans.  All questions were answered to his satisfaction. he is encouraged to contact clinic should he have any questions or concerns prior to his return visit.   Follow up plan: -Return in about 4 months (around 04/12/2020) for [place] Office Urine MA, Bring Meter and Logs- A1c in Office.  Glade Lloyd, MD Phone: [phone]  Fax: [phone]  -  This note was partially dictated with voice recognition software. Similar sounding words can be transcribed inadequately or may not  be corrected upon review.  12/12/2019, 9:59 AM

## 2020-01-06 ENCOUNTER — Other Ambulatory Visit: Payer: Self-pay | Admitting: Family Medicine

## 2020-01-06 DIAGNOSIS — E1159 Type 2 diabetes mellitus with other circulatory complications: Secondary | ICD-10-CM

## 2020-01-06 NOTE — Telephone Encounter (Signed)
[name] NTBS 6 mos ckup should have been in April mail order not sent

## 2020-01-16 ENCOUNTER — Other Ambulatory Visit: Payer: Self-pay | Admitting: *Deleted

## 2020-01-16 DIAGNOSIS — E1159 Type 2 diabetes mellitus with other circulatory complications: Secondary | ICD-10-CM

## 2020-01-16 DIAGNOSIS — I152 Hypertension secondary to endocrine disorders: Secondary | ICD-10-CM

## 2020-01-16 MED ORDER — VALSARTAN-HYDROCHLOROTHIAZIDE 320-25 MG PO TABS: 1.0000 | ORAL_TABLET | Freq: Every day | ORAL | 0 refills | Status: DC

## 2020-01-16 NOTE — Telephone Encounter (Signed)
OV 09/27/19

## 2020-03-13 DIAGNOSIS — H40023 Open angle with borderline findings, high risk, bilateral: Secondary | ICD-10-CM | POA: Diagnosis not present

## 2020-03-13 DIAGNOSIS — Z794 Long term (current) use of insulin: Secondary | ICD-10-CM | POA: Diagnosis not present

## 2020-03-13 DIAGNOSIS — E119 Type 2 diabetes mellitus without complications: Secondary | ICD-10-CM | POA: Diagnosis not present

## 2020-03-13 DIAGNOSIS — Z961 Presence of intraocular lens: Secondary | ICD-10-CM | POA: Diagnosis not present

## 2020-03-14 ENCOUNTER — Other Ambulatory Visit: Payer: Self-pay | Admitting: Family Medicine

## 2020-03-14 DIAGNOSIS — M47816 Spondylosis without myelopathy or radiculopathy, lumbar region: Secondary | ICD-10-CM

## 2020-03-19 ENCOUNTER — Other Ambulatory Visit: Payer: Self-pay | Admitting: "Endocrinology

## 2020-03-25 ENCOUNTER — Other Ambulatory Visit: Payer: Self-pay | Admitting: Family Medicine

## 2020-03-25 DIAGNOSIS — E1159 Type 2 diabetes mellitus with other circulatory complications: Secondary | ICD-10-CM

## 2020-03-29 ENCOUNTER — Encounter: Payer: Self-pay | Admitting: Family Medicine

## 2020-03-29 ENCOUNTER — Ambulatory Visit (INDEPENDENT_AMBULATORY_CARE_PROVIDER_SITE_OTHER): Payer: Medicare HMO | Admitting: Family Medicine

## 2020-03-29 ENCOUNTER — Other Ambulatory Visit: Payer: Self-pay

## 2020-03-29 VITALS — BP 131/74 | HR 77 | Temp 98.0°F | Ht 66.0 in | Wt 203.8 lb

## 2020-03-29 DIAGNOSIS — E119 Type 2 diabetes mellitus without complications: Secondary | ICD-10-CM | POA: Diagnosis not present

## 2020-03-29 DIAGNOSIS — E559 Vitamin D deficiency, unspecified: Secondary | ICD-10-CM | POA: Diagnosis not present

## 2020-03-29 DIAGNOSIS — Z794 Long term (current) use of insulin: Secondary | ICD-10-CM

## 2020-03-29 DIAGNOSIS — E6609 Other obesity due to excess calories: Secondary | ICD-10-CM

## 2020-03-29 DIAGNOSIS — Z23 Encounter for immunization: Secondary | ICD-10-CM

## 2020-03-29 DIAGNOSIS — Z1159 Encounter for screening for other viral diseases: Secondary | ICD-10-CM | POA: Diagnosis not present

## 2020-03-29 DIAGNOSIS — E1149 Type 2 diabetes mellitus with other diabetic neurological complication: Secondary | ICD-10-CM | POA: Diagnosis not present

## 2020-03-29 DIAGNOSIS — E1159 Type 2 diabetes mellitus with other circulatory complications: Secondary | ICD-10-CM

## 2020-03-29 DIAGNOSIS — E782 Mixed hyperlipidemia: Secondary | ICD-10-CM | POA: Diagnosis not present

## 2020-03-29 DIAGNOSIS — I152 Hypertension secondary to endocrine disorders: Secondary | ICD-10-CM

## 2020-03-29 DIAGNOSIS — Z6832 Body mass index (BMI) 32.0-32.9, adult: Secondary | ICD-10-CM

## 2020-03-29 LAB — BAYER DCA HB A1C WAIVED: HB A1C (BAYER DCA - WAIVED): 7.5 % — ABNORMAL HIGH (ref <7.0)

## 2020-03-29 NOTE — Progress Notes (Signed)
Subjective:  Patient ID: Patrick Moss,  male    DOB: 08/09/1940  Age: 80 y.o.    CC: Follow-up (6 month)   HPI Patrick Moss presents for  follow-up of hypertension. Patient has no history of headache chest pain or shortness of breath or recent cough. Patient also denies symptoms of TIA such as numbness weakness lateralizing. Patient denies side effects from medication. States taking it regularly.  Patient also  in for follow-up of elevated cholesterol. Doing well without complaints on current medication. Denies side effects  including myalgia and arthralgia and nausea. Also in today for liver function testing. Currently no chest pain, shortness of breath or other cardiovascular related symptoms noted.  Follow-up of diabetes. Patient does check blood sugar at home.  No readings available.  Patrick Moss did increase his insulin to 80 per Dr. Dorris Fetch.  Patient denies symptoms such as excessive hunger or urinary frequency, excessive hunger, nausea No significant hypoglycemic spells noted. Medications reviewed. Pt reports taking them regularly. Pt. denies complication/adverse reaction today.    History Patrick Moss has a past medical history of Diabetes mellitus without complication ([place]), Hyperlipidemia, and Hypertension.   Patrick Moss has a past surgical history that includes R foot fx.   His family history includes Diabetes in his brother, father, and mother.Patrick Moss reports that Patrick Moss has never smoked. Patrick Moss has never used smokeless tobacco. Patrick Moss reports that Patrick Moss does not drink alcohol and does not use drugs.  Current Outpatient Medications on File Prior to Visit  Medication Sig Dispense Refill  . ACCU-CHEK SMARTVIEW test strip TEST BLOOD SUGAR TWO TO THREE TIMES DAILY AS DIRECTED 300 each 1  . amLODipine (NORVASC) 5 MG tablet Take 1 tablet (5 mg total) by mouth daily. 90 tablet 1  . aspirin 81 MG tablet Take 81 mg by mouth daily.    . B-D ULTRAFINE III SHORT PEN 31G X 8 MM MISC USE  1  PEN  NEEDLE AS DIRECTED 270 each 3    . celecoxib (CELEBREX) 200 MG capsule TAKE 1 CAPSULE (200 MG TOTAL) BY MOUTH DAILY. WITH FOOD 90 capsule 0  . LANTUS SOLOSTAR 100 UNIT/ML Solostar Pen INJECT 80 UNITS INTO THE SKIN AT BEDTIME (DISCARD USED PEN AFTER 28 DAYS) 75 mL 0  . metFORMIN (GLUCOPHAGE-XR) 500 MG 24 hr tablet Take 1,000 mg by mouth daily after breakfast.    . rosuvastatin (CRESTOR) 5 MG tablet Take 1 tablet (5 mg total) by mouth daily. 90 tablet 1  . valsartan-hydrochlorothiazide (DIOVAN-HCT) 320-25 MG tablet TAKE 1 TABLET BY MOUTH DAILY. FOR BLOOD PRESSURE 90 tablet 0   No current facility-administered medications on file prior to visit.    ROS Review of Systems  Constitutional: Negative for fever.  Eyes: Positive for visual disturbance (macular degeneration with ongoainevauation).  Respiratory: Negative for shortness of breath.   Cardiovascular: Negative for chest pain.  Musculoskeletal: Negative for arthralgias.  Skin: Negative for rash.    Objective:  BP 131/74   Pulse 77   Temp 98 F (36.7 C) (Temporal)   Ht 5' 6"  (1.676 m)   Wt 203 lb 12.8 oz (92.4 kg)   BMI 32.89 kg/m   BP Readings from Last 3 Encounters:  03/29/20 131/74  12/12/19 (!) 148/71  09/27/19 (!) 153/75    Wt Readings from Last 3 Encounters:  03/29/20 203 lb 12.8 oz (92.4 kg)  12/12/19 205 lb 12.8 oz (93.4 kg)  09/27/19 205 lb 4 oz (93.1 kg)     Physical Exam Vitals reviewed.  Constitutional:      Appearance: Patrick Moss is well-developed.  HENT:     Head: Normocephalic and atraumatic.     Right Ear: External ear normal.     Left Ear: External ear normal.     Mouth/Throat:     Pharynx: No oropharyngeal exudate or posterior oropharyngeal erythema.  Eyes:     Pupils: Pupils are equal, round, and reactive to light.  Cardiovascular:     Rate and Rhythm: Normal rate and regular rhythm.     Heart sounds: No murmur heard.   Pulmonary:     Effort: No respiratory distress.     Breath sounds: Normal breath sounds.  Musculoskeletal:      Cervical back: Normal range of motion and neck supple.  Neurological:     Mental Status: Patrick Moss is alert and oriented to person, place, and time.     Diabetic Foot Exam - Simple   No data filed        Assessment & Plan:   Patrick Moss was seen today for follow-up.  Diagnoses and all orders for this visit:  Class 1 obesity due to excess calories without serious comorbidity with body mass index (BMI) of 32.0 to 32.9 in adult -     CBC with Differential/Platelet -     CMP14+EGFR -     VITAMIN D 25 Hydroxy (Vit-D Deficiency, Fractures) -     Lipid panel  Mixed hyperlipidemia -     CBC with Differential/Platelet -     CMP14+EGFR -     VITAMIN D 25 Hydroxy (Vit-D Deficiency, Fractures) -     Lipid panel  Hypertension associated with diabetes ([place]) -     CBC with Differential/Platelet -     CMP14+EGFR -     VITAMIN D 25 Hydroxy (Vit-D Deficiency, Fractures) -     Lipid panel  Type 2 diabetes mellitus with neurological complications (HCC) -     CBC with Differential/Platelet -     CMP14+EGFR -     VITAMIN D 25 Hydroxy (Vit-D Deficiency, Fractures) -     Bayer DCA Hb A1c Waived -     Lipid panel  Insulin dependent type 2 diabetes mellitus (HCC) -     CBC with Differential/Platelet -     CMP14+EGFR -     VITAMIN D 25 Hydroxy (Vit-D Deficiency, Fractures) -     Lipid panel  Need for hepatitis C screening test -     Hepatitis C antibody  Need for immunization against influenza -     Flu Vaccine QUAD High Dose([facility])   I am having Patrick Moss maintain his aspirin, metFORMIN, B-D ULTRAFINE III SHORT PEN, Accu-Chek SmartView, rosuvastatin, amLODipine, celecoxib, Lantus SoloStar, and valsartan-hydrochlorothiazide.  No orders of the defined types were placed in this encounter.    Follow-up: Return in about 6 months (around 09/27/2020).  [name], M.D.

## 2020-03-30 LAB — CMP14+EGFR
ALT: 29 IU/L (ref 0–44)
AST: 17 IU/L (ref 0–40)
Albumin/Globulin Ratio: 2 (ref 1.2–2.2)
Albumin: 4.6 g/dL (ref 3.7–4.7)
Alkaline Phosphatase: 92 IU/L (ref 44–121)
BUN/Creatinine Ratio: 16 (ref 10–24)
BUN: 23 mg/dL (ref 8–27)
Bilirubin Total: 0.4 mg/dL (ref 0.0–1.2)
CO2: 23 mmol/L (ref 20–29)
Calcium: 10.1 mg/dL (ref 8.6–10.2)
Chloride: 104 mmol/L (ref 96–106)
Creatinine, Ser: 1.42 mg/dL — ABNORMAL HIGH (ref 0.76–1.27)
GFR calc Af Amer: 54 mL/min/{1.73_m2} — ABNORMAL LOW (ref >59)
GFR calc non Af Amer: 47 mL/min/{1.73_m2} — ABNORMAL LOW (ref >59)
Globulin, Total: 2.3 g/dL (ref 1.5–4.5)
Glucose: 191 mg/dL — ABNORMAL HIGH (ref 65–99)
Potassium: 4.6 mmol/L (ref 3.5–5.2)
Sodium: 141 mmol/L (ref 134–144)
Total Protein: 6.9 g/dL (ref 6.0–8.5)

## 2020-03-30 LAB — CBC WITH DIFFERENTIAL/PLATELET
Basophils Absolute: 0 10*3/uL (ref 0.0–0.2)
Basos: 0 %
EOS (ABSOLUTE): 0.1 10*3/uL (ref 0.0–0.4)
Eos: 2 %
Hematocrit: 42.5 % (ref 37.5–51.0)
Hemoglobin: 14.8 g/dL (ref 13.0–17.7)
Immature Grans (Abs): 0 10*3/uL (ref 0.0–0.1)
Immature Granulocytes: 0 %
Lymphocytes Absolute: 2.3 10*3/uL (ref 0.7–3.1)
Lymphs: 34 %
MCH: 31.5 pg (ref 26.6–33.0)
MCHC: 34.8 g/dL (ref 31.5–35.7)
MCV: 90 fL (ref 79–97)
Monocytes Absolute: 0.5 10*3/uL (ref 0.1–0.9)
Monocytes: 7 %
Neutrophils Absolute: 3.9 10*3/uL (ref 1.4–7.0)
Neutrophils: 57 %
Platelets: 212 10*3/uL (ref 150–450)
RBC: 4.7 x10E6/uL (ref 4.14–5.80)
RDW: 12.3 % (ref 11.6–15.4)
WBC: 6.9 10*3/uL (ref 3.4–10.8)

## 2020-03-30 LAB — LIPID PANEL
Chol/HDL Ratio: 3.7 ratio (ref 0.0–5.0)
Cholesterol, Total: 142 mg/dL (ref 100–199)
HDL: 38 mg/dL — ABNORMAL LOW (ref >39)
LDL Chol Calc (NIH): 65 mg/dL (ref 0–99)
Triglycerides: 243 mg/dL — ABNORMAL HIGH (ref 0–149)
VLDL Cholesterol Cal: 39 mg/dL (ref 5–40)

## 2020-03-30 LAB — HEPATITIS C ANTIBODY: Hep C Virus Ab: <0.1 s/co ratio (ref 0.0–0.9)

## 2020-03-30 LAB — VITAMIN D 25 HYDROXY (VIT D DEFICIENCY, FRACTURES): Vit D, 25-Hydroxy: 19.4 ng/mL — ABNORMAL LOW (ref 30.0–100.0)

## 2020-04-01 ENCOUNTER — Other Ambulatory Visit: Payer: Self-pay | Admitting: Family Medicine

## 2020-04-01 ENCOUNTER — Encounter: Payer: Self-pay | Admitting: Family Medicine

## 2020-04-01 MED ORDER — VITAMIN D (ERGOCALCIFEROL) 1.25 MG (50000 UNIT) PO CAPS
50000.0000 [IU] | ORAL_CAPSULE | ORAL | 0 refills | Status: DC
Start: 2020-04-01 — End: 2020-07-26

## 2020-04-16 ENCOUNTER — Ambulatory Visit: Payer: Medicare HMO | Admitting: "Endocrinology

## 2020-04-16 ENCOUNTER — Encounter: Payer: Self-pay | Admitting: "Endocrinology

## 2020-04-16 ENCOUNTER — Other Ambulatory Visit: Payer: Self-pay

## 2020-04-16 VITALS — BP 142/82 | HR 76 | Ht 66.0 in | Wt 204.0 lb

## 2020-04-16 DIAGNOSIS — I10 Essential (primary) hypertension: Secondary | ICD-10-CM

## 2020-04-16 DIAGNOSIS — E559 Vitamin D deficiency, unspecified: Secondary | ICD-10-CM

## 2020-04-16 DIAGNOSIS — E782 Mixed hyperlipidemia: Secondary | ICD-10-CM

## 2020-04-16 DIAGNOSIS — E1165 Type 2 diabetes mellitus with hyperglycemia: Secondary | ICD-10-CM | POA: Diagnosis not present

## 2020-04-16 NOTE — Progress Notes (Signed)
04/07/2019       Endocrinology follow-up note   Subjective:    Patient ID: Patrick Moss, male    DOB: 07-14-1940, PCP [name], MD   Past Medical History:  Diagnosis Date  . Diabetes mellitus without complication ([place])   . Hyperlipidemia   . Hypertension    Past Surgical History:  Procedure Laterality Date  . R foot fx     Social History   Socioeconomic History  . Marital status: Divorced    Spouse name: Not on file  . Number of children: 2  . Years of education: 10  . Highest education level: 10th grade  Occupational History  . Not on file  Tobacco Use  . Smoking status: Never Smoker  . Smokeless tobacco: Never Used  Vaping Use  . Vaping Use: Never used  Substance and Sexual Activity  . Alcohol use: No    Alcohol/week: 0.0 standard drinks  . Drug use: No  . Sexual activity: Not Currently  Other Topics Concern  . Not on file  Social History Narrative  . Not on file   Social Determinants of Health   Financial Resource Strain: Low Risk   . Difficulty of Paying Living Expenses: Not hard at all  Food Insecurity: No Food Insecurity  . Worried About Charity fundraiser in the Last Year: Never true  . Ran Out of Food in the Last Year: Never true  Transportation Needs: No Transportation Needs  . Lack of Transportation (Medical): No  . Lack of Transportation (Non-Medical): No  Physical Activity: Inactive  . Days of Exercise per Week: 0 days  . Minutes of Exercise per Session: 0 min  Stress: No Stress Concern Present  . Feeling of Stress : Not at all  Social Connections: Moderately Integrated  . Frequency of Communication with Friends and Family: More than three times a week  . Frequency of Social Gatherings with Friends and Family: More than three times a week  . Attends Religious Services: More than 4 times per year  . Active Member of Clubs or Organizations: Yes  . Attends Archivist Meetings: More than 4 times per year  . Marital Status:  Divorced   Outpatient Encounter Medications as of 04/16/2020  Medication Sig  . ACCU-CHEK SMARTVIEW test strip TEST BLOOD SUGAR TWO TO THREE TIMES DAILY AS DIRECTED  . amLODipine (NORVASC) 5 MG tablet Take 1 tablet (5 mg total) by mouth daily.  Marland Kitchen aspirin 81 MG tablet Take 81 mg by mouth daily.  . B-D ULTRAFINE III SHORT PEN 31G X 8 MM MISC USE  1  PEN  NEEDLE AS DIRECTED  . celecoxib (CELEBREX) 200 MG capsule TAKE 1 CAPSULE (200 MG TOTAL) BY MOUTH DAILY. WITH FOOD  . LANTUS SOLOSTAR 100 UNIT/ML Solostar Pen INJECT 80 UNITS INTO THE SKIN AT BEDTIME (DISCARD USED PEN AFTER 28 DAYS)  . metFORMIN (GLUCOPHAGE-XR) 500 MG 24 hr tablet Take 1,000 mg by mouth daily after breakfast.  . rosuvastatin (CRESTOR) 5 MG tablet Take 1 tablet (5 mg total) by mouth daily.  . valsartan-hydrochlorothiazide (DIOVAN-HCT) 320-25 MG tablet TAKE 1 TABLET BY MOUTH DAILY. FOR BLOOD PRESSURE  . Vitamin D, Ergocalciferol, (DRISDOL) 1.25 MG (50000 UNIT) CAPS capsule Take 1 capsule (50,000 Units total) by mouth every 7 (seven) days.   No facility-administered encounter medications on file as of 04/16/2020.   ALLERGIES: Allergies  Allergen Reactions  . Sulfamethoxazole-Trimethoprim Hives   VACCINATION STATUS: Immunization History  Administered Date(s) Administered  .  Fluad Quad(high Dose 65+) 03/29/2020  . Influenza, High Dose Seasonal PF 03/28/2015, 04/07/2016, 04/30/2016, 03/20/2017, 03/17/2018, 03/07/2019  . Influenza,inj,Quad PF,6+ Mos 04/07/2016, 04/30/2016, 03/20/2017, 03/17/2018  . Moderna SARS-COVID-2 Vaccination 07/06/2019, 08/03/2019  . Pneumococcal Conjugate-13 06/30/2016  . Pneumococcal Polysaccharide-23 07/15/2017  . Tdap 12/27/2014    Diabetes He presents for his follow-up diabetic visit. He has type 2 diabetes mellitus. Onset time: He was diagnosed at approximate age of 37 years. His disease course has been improving. There are no hypoglycemic associated symptoms. Pertinent negatives for hypoglycemia  include no confusion, headaches, pallor or seizures. Associated symptoms include polydipsia and polyuria. Pertinent negatives for diabetes include no chest pain, no fatigue, no polyphagia and no weakness. There are no hypoglycemic complications. Symptoms are improving. There are no diabetic complications. Risk factors for coronary artery disease include diabetes mellitus, dyslipidemia, male sex, obesity and sedentary lifestyle. He is compliant with treatment most of the time. His weight is fluctuating minimally. He is following a generally unhealthy diet. When asked about meal planning, he reported none. He participates in exercise intermittently. His home blood glucose trend is decreasing steadily. His breakfast blood glucose range is generally 130-140 mg/dl. His bedtime blood glucose range is generally 140-180 mg/dl. His overall blood glucose range is 140-180 mg/dl. (Patient presents with improving glycemic profile both fasting and postprandial.  His previsit labs show A1c of 7.5%, overall improving.  He did not document or report hypoglycemia.   ) An ACE inhibitor/angiotensin II receptor blocker is being taken.  Hyperlipidemia This is a chronic problem. The current episode started more than 1 year ago. The problem is uncontrolled. Exacerbating diseases include diabetes and obesity. Pertinent negatives include no chest pain, myalgias or shortness of breath. Current antihyperlipidemic treatment includes statins. Risk factors for coronary artery disease include dyslipidemia, hypertension, male sex, obesity and a sedentary lifestyle.  Hypertension This is a chronic problem. The current episode started more than 1 year ago. The problem is uncontrolled. Pertinent negatives include no chest pain, headaches, neck pain, palpitations or shortness of breath. Risk factors for coronary artery disease include dyslipidemia, diabetes mellitus, obesity and sedentary lifestyle. Past treatments include ACE inhibitors and  angiotensin blockers.    Review of systems  Constitutional: + Minimally fluctuating body weight,  current  Body mass index is 32.93 kg/m. , no fatigue, no subjective hyperthermia, no subjective hypothermia Eyes: no blurry vision, no xerophthalmia ENT: no sore throat, no nodules palpated in throat, no dysphagia/odynophagia, no hoarseness Cardiovascular: no Chest Pain, no Shortness of Breath, no palpitations, no leg swelling Respiratory: no cough, no shortness of breath Gastrointestinal: no Nausea/Vomiting/Diarhhea Musculoskeletal: no muscle/joint aches Skin: no rashes, no hyperemia Neurological: no tremors, no numbness, no tingling, no dizziness Psychiatric: no depression, no anxiety   Objective:    BP (!) 142/82   Pulse 76   Ht 5' 6"  (1.676 m)   Wt 204 lb (92.5 kg)   BMI 32.93 kg/m   Wt Readings from Last 3 Encounters:  04/16/20 204 lb (92.5 kg)  03/29/20 203 lb 12.8 oz (92.4 kg)  12/12/19 205 lb 12.8 oz (93.4 kg)      Physical Exam- Limited  Constitutional:  Body mass index is 32.93 kg/m. , not in acute distress, normal state of mind Eyes:  EOMI, no exophthalmos Neck: Supple Thyroid: No gross goiter Respiratory: Adequate breathing efforts Musculoskeletal: no gross deformities, strength intact in all four extremities, no gross restriction of joint movements Skin:  no rashes, no hyperemia Neurological: no tremor with outstretched hands  Recent Results (from the past 2160 hour(s))  Bayer DCA Hb A1c Waived     Status: Abnormal   Collection Time: 03/29/20  9:40 AM  Result Value Ref Range   HB A1C (BAYER DCA - WAIVED) 7.5 (H) <7.0 %    Comment:                                       Diabetic Adult            <7.0                                       Healthy Adult        4.3 - 5.7                                                           (DCCT/NGSP) American Diabetes Association's Summary of Glycemic Recommendations for Adults with Diabetes: Hemoglobin A1c <7.0%.  More stringent glycemic goals (A1c <6.0%) may further reduce complications at the cost of increased risk of hypoglycemia.   CBC with Differential/Platelet     Status: None   Collection Time: 03/29/20  9:48 AM  Result Value Ref Range   WBC 6.9 3.4 - 10.8 x10E3/uL   RBC 4.70 4.14 - 5.80 x10E6/uL   Hemoglobin 14.8 13.0 - 17.7 g/dL   Hematocrit 42.5 37.5 - 51.0 %   MCV 90 79 - 97 fL   MCH 31.5 26.6 - 33.0 pg   MCHC 34.8 31 - 35 g/dL   RDW 12.3 11.6 - 15.4 %   Platelets 212 150 - 450 x10E3/uL   Neutrophils 57 Not Estab. %   Lymphs 34 Not Estab. %   Monocytes 7 Not Estab. %   Eos 2 Not Estab. %   Basos 0 Not Estab. %   Neutrophils Absolute 3.9 1.40 - 7.00 x10E3/uL   Lymphocytes Absolute 2.3 0 - 3 x10E3/uL   Monocytes Absolute 0.5 0 - 0 x10E3/uL   EOS (ABSOLUTE) 0.1 0.0 - 0.4 x10E3/uL   Basophils Absolute 0.0 0 - 0 x10E3/uL   Immature Granulocytes 0 Not Estab. %   Immature Grans (Abs) 0.0 0.0 - 0.1 x10E3/uL  CMP14+EGFR     Status: Abnormal   Collection Time: 03/29/20  9:48 AM  Result Value Ref Range   Glucose 191 (H) 65 - 99 mg/dL   BUN 23 8 - 27 mg/dL   Creatinine, Ser 1.42 (H) 0.76 - 1.27 mg/dL   GFR calc non Af Amer 47 (L) >59 mL/min/1.73   GFR calc Af Amer 54 (L) >59 mL/min/1.73    Comment: **In accordance with recommendations from the NKF-ASN Task force,**   [facility] is in the process of updating its eGFR calculation to the   2021 CKD-EPI creatinine equation that estimates kidney function   without a race variable.    BUN/Creatinine Ratio 16 10 - 24   Sodium 141 134 - 144 mmol/L   Potassium 4.6 3.5 - 5.2 mmol/L   Chloride 104 96 - 106 mmol/L   CO2 23 20 - 29 mmol/L   Calcium 10.1 8.6 - 10.2 mg/dL   Total  Protein 6.9 6.0 - 8.5 g/dL   Albumin 4.6 3.7 - 4.7 g/dL   Globulin, Total 2.3 1.5 - 4.5 g/dL   Albumin/Globulin Ratio 2.0 1.2 - 2.2   Bilirubin Total 0.4 0.0 - 1.2 mg/dL   Alkaline Phosphatase 92 44 - 121 IU/L    Comment:               **Please note reference  interval change**   AST 17 0 - 40 IU/L   ALT 29 0 - 44 IU/L  VITAMIN D 25 Hydroxy (Vit-D Deficiency, Fractures)     Status: Abnormal   Collection Time: 03/29/20  9:48 AM  Result Value Ref Range   Vit D, 25-Hydroxy 19.4 (L) 30.0 - 100.0 ng/mL    Comment: Vitamin D deficiency has been defined by the [place] and an Endocrine Society practice guideline as a level of serum 25-OH vitamin D less than 20 ng/mL (1,2). The Endocrine Society went on to further define vitamin D insufficiency as a level between 21 and 29 ng/mL (2). 1. IOM (Institute of Medicine). 2010. Dietary reference    intakes for calcium and D. [place]: The    [employer]. 2. Holick MF, Bink[place], Bischoff-Ferrari HA, et al.    Evaluation, treatment, and prevention of vitamin D    deficiency: an Endocrine Society clinical practice    guideline. JCEM. 2011 Jul; 96(7):1911-30.   Lipid panel     Status: Abnormal   Collection Time: 03/29/20  9:48 AM  Result Value Ref Range   Cholesterol, Total 142 100 - 199 mg/dL   Triglycerides 243 (H) 0 - 149 mg/dL   HDL 38 (L) >39 mg/dL   VLDL Cholesterol Cal 39 5 - 40 mg/dL   LDL Chol Calc (NIH) 65 0 - 99 mg/dL   Chol/HDL Ratio 3.7 0.0 - 5.0 ratio    Comment:                                   T. Chol/HDL Ratio                                             Men  Women                               1/2 Avg.Risk  3.4    3.3                                   Avg.Risk  5.0    4.4                                2X Avg.Risk  9.6    7.1                                3X Avg.Risk 23.4   11.0   Hepatitis C antibody     Status: None   Collection Time: 03/29/20  9:48 AM  Result Value Ref Range   Hep C Virus Ab <0.1 0.0 - 0.9 s/co ratio  Comment:                                   Negative:     < 0.8                              Indeterminate: 0.8 - 0.9                                   Positive:     > 0.9  The CDC recommends that a positive HCV antibody result   be followed up with a HCV Nucleic Acid Amplification  test (333545).     Assessment & Plan:   1. Uncontrolled type 2 diabetes mellitus without complication, without long-term current use of insulin (HCC)   Pt remains at a high risk for more acute and chronic complications of diabetes which include CAD, CVA, CKD, retinopathy, and neuropathy. These are all discussed in detail with the patient.  Patient presents with improving glycemic profile both fasting and postprandial.  His previsit labs show A1c of 7.5%, overall improving.  He did not document or report hypoglycemia.     -  Recent labs reviewed. - I have re-counseled the patient on diet management and weight loss  by adopting a carbohydrate restricted / protein rich  Diet.  - he  admits there is a room for improvement in his diet and drink choices. -  Suggestion is made for him to avoid simple carbohydrates  from his diet including Cakes, Sweet Desserts / Pastries, Ice Cream, Soda (diet and regular), Sweet Tea, Candies, Chips, Cookies, Sweet Pastries,  Store Bought Juices, Alcohol in Excess of  1-2 drinks a day, Artificial Sweeteners, Coffee Creamer, and "Sugar-free" Products. This will help patient to have stable blood glucose profile and potentially avoid unintended weight gain.   - Patient is advised to stick to a routine mealtimes to eat 3 meals  a day and avoid unnecessary snacks ( to snack only to correct hypoglycemia).  - I have approached patient with the following individualized plan to manage diabetes and patient agrees.   -Based on his presentation with near target glycemic profile, he will not need prandial insulin for now.   -He is advised to continue Lantus 80 units nightly, a sample of [place] was provided to him from clinic,  associated with strict monitoring of blood glucose 2 times a day-daily before breakfast and at bedtime.  He is advised to continue metformin 1000 mg ER p.o. daily after breakfast.    -He  expresses concern on cost of insulin, will be considered for either Novolin 70/30 to use twice a day .  He has documented allergy to sulfa-based medications, not a candidate for glipizide.    - Patient specific target  for A1c; LDL, HDL, Triglycerides were discussed in detail.  2) BP/HTN:  -His blood pressure is not controlled to target.    He is advised to continue his current blood pressure medications including amlodipine 5 mg p.o. daily, Hyzaar 100-25 mg p.o. daily.   3) Lipids/HPL: His recent lipid panel showed controlled LDL at  65, uncontrolled triglyceride 246.  He is advised to continue Crestor 5 mg p.o. nightly.  Side effects and precautions discussed with him.     4)  Weight/Diet: His current BMI 32.9,  a candidate for some weight loss.  No success and weight loss, CDE consult in progress, exercise, and carbohydrates information provided.  5) vitamin D deficiency: He is advised to continue 5000 units of vitamin D3 p.o. daily.  6) Chronic Care/Health Maintenance:  -Patient is on ACEI/ARB and Statin medications and encouraged to continue to follow up with Ophthalmology, Podiatrist at least yearly or according to recommendations, and advised to  stay away from smoking. I have recommended yearly flu vaccine and pneumonia vaccination at least every 5 years; moderate intensity exercise for up to 150 minutes weekly; and  sleep for at least 7 hours a day.  - I advised patient to maintain close follow up with [name], MD for primary care needs.  - Time spent on this patient care encounter:  35 min, of which > 50% was spent in  counseling and the rest reviewing his blood glucose logs , discussing his hypoglycemia and hyperglycemia episodes, reviewing his current and  previous labs / studies  ( including abstraction from other facilities) and medications  doses and developing a  long term treatment plan and documenting his care.   Please refer to Patient Instructions for Blood Glucose  Monitoring and Insulin/Medications Dosing Guide"  in media tab for additional information. Please  also refer to " Patient Self Inventory" in the Media  tab for reviewed elements of pertinent patient history.  Tery L Trevathan participated in the discussions, expressed understanding, and voiced agreement with the above plans.  All questions were answered to his satisfaction. he is encouraged to contact clinic should he have any questions or concerns prior to his return visit.    Follow up plan: -Return in about 4 months (around 08/14/2020) for Bring Meter and Logs- A1c in Office.  Glade Lloyd, MD Phone: [phone]  Fax: [phone]  -  This note was partially dictated with voice recognition software. Similar sounding words can be transcribed inadequately or may not  be corrected upon review.  04/16/2020, 2:05 PM

## 2020-04-16 NOTE — Patient Instructions (Signed)

## 2020-05-31 ENCOUNTER — Other Ambulatory Visit: Payer: Self-pay | Admitting: Family Medicine

## 2020-05-31 ENCOUNTER — Other Ambulatory Visit: Payer: Self-pay | Admitting: "Endocrinology

## 2020-05-31 DIAGNOSIS — I152 Hypertension secondary to endocrine disorders: Secondary | ICD-10-CM

## 2020-07-10 ENCOUNTER — Other Ambulatory Visit: Payer: Self-pay | Admitting: Family Medicine

## 2020-07-10 DIAGNOSIS — E1159 Type 2 diabetes mellitus with other circulatory complications: Secondary | ICD-10-CM

## 2020-07-10 DIAGNOSIS — E782 Mixed hyperlipidemia: Secondary | ICD-10-CM

## 2020-07-10 DIAGNOSIS — I152 Hypertension secondary to endocrine disorders: Secondary | ICD-10-CM

## 2020-07-26 ENCOUNTER — Other Ambulatory Visit: Payer: Self-pay

## 2020-07-26 ENCOUNTER — Encounter: Payer: Self-pay | Admitting: Family Medicine

## 2020-07-26 ENCOUNTER — Ambulatory Visit (INDEPENDENT_AMBULATORY_CARE_PROVIDER_SITE_OTHER): Payer: Medicare HMO | Admitting: Family Medicine

## 2020-07-26 VITALS — BP 135/78 | HR 64 | Temp 98.1°F | Ht 66.0 in | Wt 206.0 lb

## 2020-07-26 DIAGNOSIS — E1149 Type 2 diabetes mellitus with other diabetic neurological complication: Secondary | ICD-10-CM

## 2020-07-26 DIAGNOSIS — E1159 Type 2 diabetes mellitus with other circulatory complications: Secondary | ICD-10-CM

## 2020-07-26 DIAGNOSIS — Z794 Long term (current) use of insulin: Secondary | ICD-10-CM | POA: Diagnosis not present

## 2020-07-26 DIAGNOSIS — E782 Mixed hyperlipidemia: Secondary | ICD-10-CM

## 2020-07-26 DIAGNOSIS — E119 Type 2 diabetes mellitus without complications: Secondary | ICD-10-CM

## 2020-07-26 DIAGNOSIS — E559 Vitamin D deficiency, unspecified: Secondary | ICD-10-CM | POA: Diagnosis not present

## 2020-07-26 DIAGNOSIS — I152 Hypertension secondary to endocrine disorders: Secondary | ICD-10-CM

## 2020-07-26 LAB — BAYER DCA HB A1C WAIVED: HB A1C (BAYER DCA - WAIVED): 8.4 % — ABNORMAL HIGH (ref <7.0)

## 2020-07-27 LAB — CMP14+EGFR
ALT: 25 IU/L (ref 0–44)
AST: 18 IU/L (ref 0–40)
Albumin/Globulin Ratio: 2.1 (ref 1.2–2.2)
Albumin: 4.7 g/dL (ref 3.7–4.7)
Alkaline Phosphatase: 93 IU/L (ref 44–121)
BUN/Creatinine Ratio: 14 (ref 10–24)
BUN: 21 mg/dL (ref 8–27)
Bilirubin Total: 0.4 mg/dL (ref 0.0–1.2)
CO2: 23 mmol/L (ref 20–29)
Calcium: 10.1 mg/dL (ref 8.6–10.2)
Chloride: 105 mmol/L (ref 96–106)
Creatinine, Ser: 1.48 mg/dL — ABNORMAL HIGH (ref 0.76–1.27)
GFR calc Af Amer: 52 mL/min/{1.73_m2} — ABNORMAL LOW (ref >59)
GFR calc non Af Amer: 45 mL/min/{1.73_m2} — ABNORMAL LOW (ref >59)
Globulin, Total: 2.2 g/dL (ref 1.5–4.5)
Glucose: 203 mg/dL — ABNORMAL HIGH (ref 65–99)
Potassium: 4.8 mmol/L (ref 3.5–5.2)
Sodium: 145 mmol/L — ABNORMAL HIGH (ref 134–144)
Total Protein: 6.9 g/dL (ref 6.0–8.5)

## 2020-07-27 LAB — LIPID PANEL
Chol/HDL Ratio: 3.5 ratio (ref 0.0–5.0)
Cholesterol, Total: 138 mg/dL (ref 100–199)
HDL: 39 mg/dL — ABNORMAL LOW (ref >39)
LDL Chol Calc (NIH): 70 mg/dL (ref 0–99)
Triglycerides: 169 mg/dL — ABNORMAL HIGH (ref 0–149)
VLDL Cholesterol Cal: 29 mg/dL (ref 5–40)

## 2020-07-27 LAB — CBC WITH DIFFERENTIAL/PLATELET
Basophils Absolute: 0 10*3/uL (ref 0.0–0.2)
Basos: 1 %
EOS (ABSOLUTE): 0.1 10*3/uL (ref 0.0–0.4)
Eos: 1 %
Hematocrit: 44.9 % (ref 37.5–51.0)
Hemoglobin: 15.1 g/dL (ref 13.0–17.7)
Immature Grans (Abs): 0 10*3/uL (ref 0.0–0.1)
Immature Granulocytes: 0 %
Lymphocytes Absolute: 2.4 10*3/uL (ref 0.7–3.1)
Lymphs: 38 %
MCH: 30.2 pg (ref 26.6–33.0)
MCHC: 33.6 g/dL (ref 31.5–35.7)
MCV: 90 fL (ref 79–97)
Monocytes Absolute: 0.4 10*3/uL (ref 0.1–0.9)
Monocytes: 7 %
Neutrophils Absolute: 3.4 10*3/uL (ref 1.4–7.0)
Neutrophils: 53 %
Platelets: 203 10*3/uL (ref 150–450)
RBC: 5 x10E6/uL (ref 4.14–5.80)
RDW: 12.8 % (ref 11.6–15.4)
WBC: 6.4 10*3/uL (ref 3.4–10.8)

## 2020-07-27 LAB — VITAMIN D 25 HYDROXY (VIT D DEFICIENCY, FRACTURES): Vit D, 25-Hydroxy: 40.4 ng/mL (ref 30.0–100.0)

## 2020-07-30 ENCOUNTER — Encounter: Payer: Self-pay | Admitting: Family Medicine

## 2020-07-30 MED ORDER — ROSUVASTATIN CALCIUM 5 MG PO TABS: 5.0000 mg | ORAL_TABLET | Freq: Every day | ORAL | 3 refills | Status: DC

## 2020-07-30 MED ORDER — VALSARTAN-HYDROCHLOROTHIAZIDE 320-25 MG PO TABS: 1.0000 | ORAL_TABLET | Freq: Every day | ORAL | 3 refills | Status: DC

## 2020-07-30 NOTE — Progress Notes (Signed)
Subjective:  Patient ID: Patrick Moss, male    DOB: [birth year]  Age: 80 y.o. MRN: [id number]  CC: Medical Management of Chronic Issues (4 month/)   HPI Patrick Moss presents forFollow-up of diabetes. Patient checks blood sugar at home.   150 fasting and 200Patient denies symptoms such as polyuria, polydipsia, excessive hunger, nausea No significant hypoglycemic spells noted. Medications reviewed. Pt reports taking them regularly without complication/adverse reaction being reported today.   Patient in for follow-up of elevated cholesterol. Doing well without complaints on current medication. Denies side effects of statin including myalgia and arthralgia and nausea. Also in today for liver function testing. Currently no chest pain, shortness of breath or other cardiovascular related symptoms noted.   Follow-up of hypertension. Patient has no history of headache chest pain or shortness of breath or recent cough. Patient also denies symptoms of TIA such as numbness weakness lateralizing. Patient checks  blood pressure at home and has not had any elevated readings recently. Patient denies side effects from his medication. States taking it regularly.   History Patrick Moss has a past medical history of Diabetes mellitus without complication (Patrick Moss), Hyperlipidemia, and Hypertension.   Patrick Moss has a past surgical history that includes R foot fx.   His family history includes Diabetes in his brother, father, and mother.Patrick Moss reports that Patrick Moss has never smoked. Patrick Moss has never used smokeless tobacco. Patrick Moss reports that Patrick Moss does not drink alcohol and does not use drugs.  Current Outpatient Medications on File Prior to Visit  Medication Sig Dispense Refill  . ACCU-CHEK SMARTVIEW test strip TEST BLOOD SUGAR TWO TO THREE TIMES DAILY AS DIRECTED 300 each 1  . amLODipine (NORVASC) 5 MG tablet TAKE 1 TABLET (5 MG TOTAL) BY MOUTH DAILY. 90 tablet 1  . aspirin 81 MG tablet Take 81 mg by mouth daily.    . B-D ULTRAFINE III  SHORT PEN 31G X 8 MM MISC USE  1  PEN  NEEDLE AS DIRECTED 270 each 3  . celecoxib (CELEBREX) 200 MG capsule TAKE 1 CAPSULE (200 MG TOTAL) BY MOUTH DAILY. WITH FOOD 90 capsule 0  . Cholecalciferol (VITAMIN D3) 75 MCG (3000 UT) TABS Take by mouth.    Marland Kitchen LANTUS SOLOSTAR 100 UNIT/ML Solostar Pen INJECT 80 UNITS INTO THE SKIN AT BEDTIME (DISCARD USED PEN AFTER 28 DAYS) 75 mL 0  . metFORMIN (GLUCOPHAGE-XR) 500 MG 24 hr tablet Take 1,000 mg by mouth daily after breakfast.     No current facility-administered medications on file prior to visit.    ROS Review of Systems  Constitutional: Negative for fever.  Respiratory: Negative for shortness of breath.   Cardiovascular: Negative for chest pain.  Musculoskeletal: Negative for arthralgias.  Skin: Negative for rash.    Objective:  BP 135/78   Pulse 64   Temp 98.1 F (36.7 C) (Temporal)   Ht 5' 6"  (1.676 m)   Wt 206 lb (93.4 kg)   BMI 33.25 kg/m   BP Readings from Last 3 Encounters:  07/26/20 135/78  04/16/20 (!) 142/82  03/29/20 131/74    Wt Readings from Last 3 Encounters:  07/26/20 206 lb (93.4 kg)  04/16/20 204 lb (92.5 kg)  03/29/20 203 lb 12.8 oz (92.4 kg)     Physical Exam Vitals reviewed.  Constitutional:      Appearance: Patrick Moss is well-developed and well-nourished.  HENT:     Head: Normocephalic and atraumatic.     Right Ear: Tympanic membrane and external ear normal. No decreased hearing  noted.     Left Ear: Tympanic membrane and external ear normal. No decreased hearing noted.     Mouth/Throat:     Pharynx: No oropharyngeal exudate or posterior oropharyngeal erythema.  Eyes:     Pupils: Pupils are equal, round, and reactive to light.  Cardiovascular:     Rate and Rhythm: Normal rate and regular rhythm.     Heart sounds: No murmur heard.   Pulmonary:     Effort: No respiratory distress.     Breath sounds: Normal breath sounds.  Abdominal:     General: Bowel sounds are normal.     Palpations: Abdomen is soft.  There is no mass.     Tenderness: There is no abdominal tenderness.  Musculoskeletal:     Cervical back: Normal range of motion and neck supple.       Assessment & Plan:   Patrick Moss was seen today for medical management of chronic issues.  Diagnoses and all orders for this visit:  Type 2 diabetes mellitus with neurological complications ([place]) -     Bayer DCA Hb A1c Waived  Hypertension associated with diabetes ([place]) -     valsartan-hydrochlorothiazide (DIOVAN-HCT) 320-25 MG tablet; Take 1 tablet by mouth daily. For Blood Pressure -     CMP14+EGFR -     CBC with Differential/Platelet  Mixed hyperlipidemia -     rosuvastatin (CRESTOR) 5 MG tablet; Take 1 tablet (5 mg total) by mouth daily. -     CMP14+EGFR -     CBC with Differential/Platelet -     Lipid panel  Insulin dependent type 2 diabetes mellitus (HCC) -     Bayer DCA Hb A1c Waived  Vitamin D deficiency -     VITAMIN D 25 Hydroxy (Vit-D Deficiency, Fractures)      I have discontinued Patrick Moss's Vitamin D (Ergocalciferol). I am also having him maintain his aspirin, metFORMIN, B-D ULTRAFINE III SHORT PEN, Accu-Chek SmartView, celecoxib, Lantus SoloStar, amLODipine, valsartan-hydrochlorothiazide, rosuvastatin, and Vitamin D3.  Meds ordered this encounter  Medications  . valsartan-hydrochlorothiazide (DIOVAN-HCT) 320-25 MG tablet    Sig: Take 1 tablet by mouth daily. For Blood Pressure    Dispense:  90 tablet    Refill:  3  . rosuvastatin (CRESTOR) 5 MG tablet    Sig: Take 1 tablet (5 mg total) by mouth daily.    Dispense:  90 tablet    Refill:  3     Follow-up: No follow-ups on file.  [name], M.D.

## 2020-08-15 ENCOUNTER — Ambulatory Visit: Payer: Medicare HMO | Admitting: "Endocrinology

## 2020-08-15 ENCOUNTER — Other Ambulatory Visit: Payer: Self-pay

## 2020-08-15 ENCOUNTER — Encounter: Payer: Self-pay | Admitting: "Endocrinology

## 2020-08-15 VITALS — BP 142/76 | HR 65 | Ht 66.0 in | Wt 201.2 lb

## 2020-08-15 DIAGNOSIS — E782 Mixed hyperlipidemia: Secondary | ICD-10-CM | POA: Diagnosis not present

## 2020-08-15 DIAGNOSIS — I10 Essential (primary) hypertension: Secondary | ICD-10-CM

## 2020-08-15 DIAGNOSIS — E1165 Type 2 diabetes mellitus with hyperglycemia: Secondary | ICD-10-CM

## 2020-08-15 DIAGNOSIS — E559 Vitamin D deficiency, unspecified: Secondary | ICD-10-CM

## 2020-08-15 MED ORDER — GLIPIZIDE ER 2.5 MG PO TB24: 2.5000 mg | ORAL_TABLET | Freq: Every day | ORAL | 1 refills | Status: DC

## 2020-08-15 NOTE — Patient Instructions (Signed)

## 2020-08-15 NOTE — Progress Notes (Signed)
04/07/2019       Endocrinology follow-up note   Subjective:    Patient ID: Patrick Moss, male    DOB: 02-10-1941, PCP [name], MD   Past Medical History:  Diagnosis Date  . Diabetes mellitus without complication ([place])   . Hyperlipidemia   . Hypertension    Past Surgical History:  Procedure Laterality Date  . R foot fx     Social History   Socioeconomic History  . Marital status: Divorced    Spouse name: Not on file  . Number of children: 2  . Years of education: 10  . Highest education level: 10th grade  Occupational History  . Not on file  Tobacco Use  . Smoking status: Never Smoker  . Smokeless tobacco: Never Used  Vaping Use  . Vaping Use: Never used  Substance and Sexual Activity  . Alcohol use: No    Alcohol/week: 0.0 standard drinks  . Drug use: No  . Sexual activity: Not Currently  Other Topics Concern  . Not on file  Social History Narrative  . Not on file   Social Determinants of Health   Financial Resource Strain: Low Risk   . Difficulty of Paying Living Expenses: Not hard at all  Food Insecurity: No Food Insecurity  . Worried About Charity fundraiser in the Last Year: Never true  . Ran Out of Food in the Last Year: Never true  Transportation Needs: No Transportation Needs  . Lack of Transportation (Medical): No  . Lack of Transportation (Non-Medical): No  Physical Activity: Inactive  . Days of Exercise per Week: 0 days  . Minutes of Exercise per Session: 0 min  Stress: No Stress Concern Present  . Feeling of Stress : Not at all  Social Connections: Moderately Integrated  . Frequency of Communication with Friends and Family: More than three times a week  . Frequency of Social Gatherings with Friends and Family: More than three times a week  . Attends Religious Services: More than 4 times per year  . Active Member of Clubs or Organizations: Yes  . Attends Archivist Meetings: More than 4 times per year  . Marital Status:  Divorced   Outpatient Encounter Medications as of 08/15/2020  Medication Sig  . Cholecalciferol (VITAMIN D3) 50 MCG (2000 UT) TABS Take by mouth.  Marland Kitchen glipiZIDE (GLUCOTROL XL) 2.5 MG 24 hr tablet Take 1 tablet (2.5 mg total) by mouth daily with breakfast.  . ACCU-CHEK SMARTVIEW test strip TEST BLOOD SUGAR TWO TO THREE TIMES DAILY AS DIRECTED  . amLODipine (NORVASC) 5 MG tablet TAKE 1 TABLET (5 MG TOTAL) BY MOUTH DAILY.  Marland Kitchen aspirin 81 MG tablet Take 81 mg by mouth daily.  . B-D ULTRAFINE III SHORT PEN 31G X 8 MM MISC USE  1  PEN  NEEDLE AS DIRECTED  . celecoxib (CELEBREX) 200 MG capsule TAKE 1 CAPSULE (200 MG TOTAL) BY MOUTH DAILY. WITH FOOD  . LANTUS SOLOSTAR 100 UNIT/ML Solostar Pen INJECT 80 UNITS INTO THE SKIN AT BEDTIME (DISCARD USED PEN AFTER 28 DAYS)  . metFORMIN (GLUCOPHAGE-XR) 500 MG 24 hr tablet Take 1,000 mg by mouth daily after breakfast.  . rosuvastatin (CRESTOR) 5 MG tablet Take 1 tablet (5 mg total) by mouth daily.  . valsartan-hydrochlorothiazide (DIOVAN-HCT) 320-25 MG tablet Take 1 tablet by mouth daily. For Blood Pressure  . [DISCONTINUED] Cholecalciferol (VITAMIN D3) 75 MCG (3000 UT) TABS Take by mouth.   No facility-administered encounter medications on file as  of 08/15/2020.   ALLERGIES: Allergies  Allergen Reactions  . Sulfamethoxazole-Trimethoprim Hives   VACCINATION STATUS: Immunization History  Administered Date(s) Administered  . Fluad Quad(high Dose 65+) 03/29/2020  . Influenza, High Dose Seasonal PF 03/28/2015, 04/07/2016, 04/30/2016, 03/20/2017, 03/17/2018, 03/07/2019  . Influenza,inj,Quad PF,6+ Mos 04/07/2016, 04/30/2016, 03/20/2017, 03/17/2018  . Moderna Sars-Covid-2 Vaccination 07/06/2019, 08/03/2019, 05/02/2020  . Pneumococcal Conjugate-13 06/30/2016  . Pneumococcal Polysaccharide-23 07/15/2017  . Tdap 12/27/2014    Diabetes He presents for his follow-up diabetic visit. He has type 2 diabetes mellitus. Onset time: He was diagnosed at approximate age of 56  years. His disease course has been improving. There are no hypoglycemic associated symptoms. Pertinent negatives for hypoglycemia include no confusion, headaches, pallor or seizures. Associated symptoms include polydipsia and polyuria. Pertinent negatives for diabetes include no chest pain, no fatigue, no polyphagia and no weakness. There are no hypoglycemic complications. Symptoms are improving. There are no diabetic complications. Risk factors for coronary artery disease include diabetes mellitus, dyslipidemia, male sex, obesity and sedentary lifestyle. He is compliant with treatment most of the time. His weight is fluctuating minimally. He is following a generally unhealthy diet. When asked about meal planning, he reported none. He participates in exercise intermittently. His home blood glucose trend is decreasing steadily. His breakfast blood glucose range is generally 140-180 mg/dl. His bedtime blood glucose range is generally 140-180 mg/dl. His overall blood glucose range is 140-180 mg/dl. (He presents with slight worsening in his glycemic profile both fasting and postprandial.  His point-of-care A1c is 8.4% increasing from 7.5%.  He did not document any hypoglycemia.   ) An ACE inhibitor/angiotensin II receptor blocker is being taken.  Hyperlipidemia This is a chronic problem. The current episode started more than 1 year ago. The problem is uncontrolled. Exacerbating diseases include diabetes and obesity. Pertinent negatives include no chest pain, myalgias or shortness of breath. Current antihyperlipidemic treatment includes statins. Risk factors for coronary artery disease include dyslipidemia, hypertension, male sex, obesity and a sedentary lifestyle.  Hypertension This is a chronic problem. The current episode started more than 1 year ago. The problem is uncontrolled. Pertinent negatives include no chest pain, headaches, neck pain, palpitations or shortness of breath. Risk factors for coronary artery  disease include dyslipidemia, diabetes mellitus, obesity and sedentary lifestyle. Past treatments include ACE inhibitors and angiotensin blockers.    Review of systems  Constitutional: + Minimally fluctuating body weight,  current  Body mass index is 32.47 kg/m. , no fatigue, no subjective hyperthermia, no subjective hypothermia    Objective:    BP (!) 142/76   Pulse 65   Ht _0  (1.676 m)   Wt 201 lb 3.2 oz (91.3 kg)   BMI 32.47 kg/m   Wt Readings from Last 3 Encounters:  08/15/20 201 lb 3.2 oz (91.3 kg)  07/26/20 206 lb (93.4 kg)  04/16/20 204 lb (92.5 kg)      Physical Exam- Limited  Constitutional:  Body mass index is 32.47 kg/m. , not in acute distress, normal state of mind    Recent Results (from the past 2160 hour(s))  Bayer DCA Hb A1c Waived     Status: Abnormal   Collection Time: 07/26/20  8:51 AM  Result Value Ref Range   HB A1C (BAYER DCA - WAIVED) 8.4 (H) <7.0 %    Comment:  Diabetic Adult            <7.0                                       Healthy Adult        4.3 - 5.7                                                           (DCCT/NGSP) American Diabetes Association's Summary of Glycemic Recommendations for Adults with Diabetes: Hemoglobin A1c <7.0%. More stringent glycemic goals (A1c <6.0%) may further reduce complications at the cost of increased risk of hypoglycemia.   CMP14+EGFR     Status: Abnormal   Collection Time: 07/26/20  9:01 AM  Result Value Ref Range   Glucose 203 (H) 65 - 99 mg/dL   BUN 21 8 - 27 mg/dL   Creatinine, Ser 1.48 (H) 0.76 - 1.27 mg/dL   GFR calc non Af Amer 45 (L) >59 mL/min/1.73   GFR calc Af Amer 52 (L) >59 mL/min/1.73    Comment: **In accordance with recommendations from the NKF-ASN Task force,**   [facility] is in the process of updating its eGFR calculation to the   2021 CKD-EPI creatinine equation that estimates kidney function   without a race variable.    BUN/Creatinine  Ratio 14 10 - 24   Sodium 145 (H) 134 - 144 mmol/L   Potassium 4.8 3.5 - 5.2 mmol/L   Chloride 105 96 - 106 mmol/L   CO2 23 20 - 29 mmol/L   Calcium 10.1 8.6 - 10.2 mg/dL   Total Protein 6.9 6.0 - 8.5 g/dL   Albumin 4.7 3.7 - 4.7 g/dL   Globulin, Total 2.2 1.5 - 4.5 g/dL   Albumin/Globulin Ratio 2.1 1.2 - 2.2   Bilirubin Total 0.4 0.0 - 1.2 mg/dL   Alkaline Phosphatase 93 44 - 121 IU/L   AST 18 0 - 40 IU/L   ALT 25 0 - 44 IU/L  CBC with Differential/Platelet     Status: None   Collection Time: 07/26/20  9:01 AM  Result Value Ref Range   WBC 6.4 3.4 - 10.8 x10E3/uL   RBC 5.00 4.14 - 5.80 x10E6/uL   Hemoglobin 15.1 13.0 - 17.7 g/dL   Hematocrit 44.9 37.5 - 51.0 %   MCV 90 79 - 97 fL   MCH 30.2 26.6 - 33.0 pg   MCHC 33.6 31.5 - 35.7 g/dL   RDW 12.8 11.6 - 15.4 %   Platelets 203 150 - 450 x10E3/uL   Neutrophils 53 Not Estab. %   Lymphs 38 Not Estab. %   Monocytes 7 Not Estab. %   Eos 1 Not Estab. %   Basos 1 Not Estab. %   Neutrophils Absolute 3.4 1.4 - 7.0 x10E3/uL   Lymphocytes Absolute 2.4 0.7 - 3.1 x10E3/uL   Monocytes Absolute 0.4 0.1 - 0.9 x10E3/uL   EOS (ABSOLUTE) 0.1 0.0 - 0.4 x10E3/uL   Basophils Absolute 0.0 0.0 - 0.2 x10E3/uL   Immature Granulocytes 0 Not Estab. %   Immature Grans (Abs) 0.0 0.0 - 0.1 x10E3/uL  VITAMIN D 25 Hydroxy (Vit-D Deficiency, Fractures)     Status: None   Collection Time: 07/26/20  9:01 AM  Result Value Ref Range   Vit D, 25-Hydroxy 40.4 30.0 - 100.0 ng/mL    Comment: Vitamin D deficiency has been defined by the [place] practice guideline as a level of serum 25-OH vitamin D less than 20 ng/mL (1,2). The Endocrine Society went on to further define vitamin D insufficiency as a level between 21 and 29 ng/mL (2). 1. IOM (Institute of Medicine). 2010. Dietary reference    intakes for calcium and D. [place]: The    [employer]. 2. Holick MF, Binkley Maxbass, Bischoff-Ferrari HA, et al.     Evaluation, treatment, and prevention of vitamin D    deficiency: an Endocrine Society clinical practice    guideline. JCEM. 2011 Jul; 96(7):1911-30.   Lipid panel     Status: Abnormal   Collection Time: 07/26/20  9:01 AM  Result Value Ref Range   Cholesterol, Total 138 100 - 199 mg/dL   Triglycerides 169 (H) 0 - 149 mg/dL   HDL 39 (L) >39 mg/dL   VLDL Cholesterol Cal 29 5 - 40 mg/dL   LDL Chol Calc (NIH) 70 0 - 99 mg/dL   Chol/HDL Ratio 3.5 0.0 - 5.0 ratio    Comment:                                   T. Chol/HDL Ratio                                             Men  Women                               1/2 Avg.Risk  3.4    3.3                                   Avg.Risk  5.0    4.4                                2X Avg.Risk  9.6    7.1                                3X Avg.Risk 23.4   11.0     Assessment & Plan:   1. Uncontrolled type 2 diabetes mellitus without complication, without long-term current use of insulin ([facility])   Pt remains at a high risk for more acute and chronic complications of diabetes which include CAD, CVA, CKD, retinopathy, and neuropathy. These are all discussed in detail with the patient.  He presents with slight worsening in his glycemic profile both fasting and postprandial.  His point-of-care A1c is 8.4% increasing from 7.5%.  He did not document any hypoglycemia.     -  Recent labs reviewed. - I have re-counseled the patient on diet management and weight loss  by adopting a carbohydrate restricted / protein rich  Diet.  - he acknowledges that there is a room for improvement in his food and drink choices. - Suggestion is made for him to avoid simple carbohydrates  from his diet including Cakes, Sweet Desserts, Ice Cream, Soda (diet and regular), Sweet Tea, Candies, Chips, Cookies, Store Bought Juices, Alcohol in Excess of  1-2 drinks a day, Artificial Sweeteners,  Coffee Creamer, and "Sugar-free" Products, Lemonade. This will help patient to have more stable  blood glucose profile and potentially avoid unintended weight gain.   - Patient is advised to stick to a routine mealtimes to eat 3 meals  a day and avoid unnecessary snacks ( to snack only to correct hypoglycemia).  - I have approached patient with the following individualized plan to manage diabetes and patient agrees.   -Based on his presentation with tightly controlled fasting glycemic profile, he will not tolerate any higher dose of basal insulin.  He will not need prandial insulin for now.    -He is advised to continue Lantus 80 units nightly, a sample of Toujeo was provided to him from clinic, advised to continue monitoring blood glucose twice a day-daily before breakfast and at bedtime.    -Due to his mild CKD, he is advised to lower his Metformin to 500 mg ER p.o. daily at breakfast.  -He has documented history of mild skin hives from sulfa medications.  He may benefit from a trial of low-dose glipizide.  I discussed safe slow reintroduction of glipizide at a low dose.  -I discussed and prescribed glipizide 2.5 mg XL daily at breakfast.   -He expresses concern on cost of insulin, will be considered for either Novolin 70/30 to use twice a day .  He will not afford GLP-1 receptor agonist.  - Patient specific target  for A1c; LDL, HDL, Triglycerides were discussed in detail.  2) BP/HTN:  His blood pressure is near target.    He is advised to continue his current blood pressure medications including amlodipine 5 mg p.o. daily, Hyzaar 100-25 mg p.o. daily.   3) Lipids/HPL: His recent lipid panel showed controlled LDL at  65, uncontrolled triglyceride 246.  He is advised to continue Crestor 5 mg p.o. nightly.  Side effects and precautions discussed with him.     4)  Weight/Diet: His current BMI 32.4-a candidate for some weight loss.  No success and weight loss, CDE consult in progress, exercise, and carbohydrates information provided.  5) vitamin D deficiency: He is advised to  continue 5000 units of vitamin D3 p.o. daily.     6) Chronic Care/Health Maintenance:  -Patient is on ACEI/ARB and Statin medications and encouraged to continue to follow up with Ophthalmology, Podiatrist at least yearly or according to recommendations, and advised to  stay away from smoking. I have recommended yearly flu vaccine and pneumonia vaccination at least every 5 years; moderate intensity exercise for up to 150 minutes weekly; and  sleep for at least 7 hours a day.  - I advised patient to maintain close follow up with [name], MD for primary care needs.  - Time spent on this patient care encounter:  40 min, of which > 50% was spent in  counseling and the rest reviewing his blood glucose logs , discussing his hypoglycemia and hyperglycemia episodes, reviewing his current and  previous labs / studies  ( including abstraction from other facilities) and medications  doses and developing a  long term treatment plan and documenting his care.   Please refer to Patient Instructions for Blood Glucose Monitoring and Insulin/Medications Dosing Guide"  in media tab for additional information. Please  also refer to " Patient Self Inventory" in the Media  tab  for reviewed elements of pertinent patient history.  Murrell L Mccutchan participated in the discussions, expressed understanding, and voiced agreement with the above plans.  All questions were answered to his satisfaction. he is encouraged to contact clinic should he have any questions or concerns prior to his return visit.   Follow up plan: -Return in about 3 months (around 11/15/2020) for Bring Meter and Logs- A1c in Office.  Glade Lloyd, MD Phone: [phone]  Fax: [phone]  -  This note was partially dictated with voice recognition software. Similar sounding words can be transcribed inadequately or may not  be corrected upon review.  08/15/2020, 11:18 AM

## 2020-08-16 ENCOUNTER — Other Ambulatory Visit: Payer: Self-pay | Admitting: "Endocrinology

## 2020-09-10 ENCOUNTER — Other Ambulatory Visit: Payer: Self-pay

## 2020-09-10 ENCOUNTER — Ambulatory Visit (INDEPENDENT_AMBULATORY_CARE_PROVIDER_SITE_OTHER): Payer: Medicare HMO

## 2020-09-10 ENCOUNTER — Encounter: Payer: Self-pay | Admitting: Family Medicine

## 2020-09-10 ENCOUNTER — Ambulatory Visit (INDEPENDENT_AMBULATORY_CARE_PROVIDER_SITE_OTHER): Payer: Medicare HMO | Admitting: Family Medicine

## 2020-09-10 VITALS — BP 118/66 | HR 68 | Temp 98.2°F | Ht 66.0 in | Wt 201.8 lb

## 2020-09-10 DIAGNOSIS — R2241 Localized swelling, mass and lump, right lower limb: Secondary | ICD-10-CM | POA: Diagnosis not present

## 2020-09-10 DIAGNOSIS — M79671 Pain in right foot: Secondary | ICD-10-CM | POA: Diagnosis not present

## 2020-09-10 NOTE — Progress Notes (Signed)
Chief Complaint  Patient presents with  . Foot Pain   ressure dosne  HPI  Patient presents today for pain in the dorsal right foot. Onset yesterday when he stepped up into his truck. The foot was feeling numb, then it lost its strength as he stepped up and he fell. He now has a knot on the dorsal foot.  Hurts a little when he walks. Just had leg circulation checked with his DM doctor, Dr. Dorris Fetch,  two weeks ago one was 23 & the other 31. Was told it was good.   PMH: Smoking status noted ROS: Per HPI  Objective: BP 118/66   Pulse 68   Temp 98.2 F (36.8 C)   Ht 5\' 6"  (1.676 m)   Wt 201 lb 12.8 oz (91.5 kg)   SpO2 98%   BMI 32.57 kg/m  Gen: NAD, alert, cooperative with exam HEENT: NCAT, EOMI, PERRL CV: RRR, good S1/S2, no murmur Resp: CTABL, no wheezes, non-labored Abd: SNTND, BS present, no guarding or organomegaly Ext: No edema, warm Neuro: Alert and oriented, No gross deficits  Assessment and plan:  1. Subcutaneous nodule of right foot     No orders of the defined types were placed in this encounter.   Orders Placed This Encounter  Procedures  . DG Foot Complete Right    Standing Status:   Future    Standing Expiration Date:   10/11/2020    Order Specific Question:   Reason for Exam (SYMPTOM  OR DIAGNOSIS REQUIRED)    Answer:   fell, knot dorsally    Order Specific Question:   Preferred imaging location?    Answer:   Internal    Order Specific Question:   Release to patient    Answer:   Immediate    Follow up as needed.  [name], MD

## 2020-09-11 DIAGNOSIS — H40023 Open angle with borderline findings, high risk, bilateral: Secondary | ICD-10-CM | POA: Diagnosis not present

## 2020-09-11 LAB — HM DIABETES EYE EXAM

## 2020-09-27 ENCOUNTER — Other Ambulatory Visit: Payer: Self-pay | Admitting: Family Medicine

## 2020-09-27 ENCOUNTER — Ambulatory Visit: Payer: Medicare HMO | Admitting: Family Medicine

## 2020-09-27 DIAGNOSIS — M47816 Spondylosis without myelopathy or radiculopathy, lumbar region: Secondary | ICD-10-CM

## 2020-10-02 ENCOUNTER — Ambulatory Visit (INDEPENDENT_AMBULATORY_CARE_PROVIDER_SITE_OTHER): Payer: Medicare HMO

## 2020-10-02 VITALS — Ht 66.0 in | Wt 203.0 lb

## 2020-10-02 DIAGNOSIS — Z0000 Encounter for general adult medical examination without abnormal findings: Secondary | ICD-10-CM | POA: Diagnosis not present

## 2020-10-02 NOTE — Progress Notes (Signed)
Subjective:   Patrick Moss is a 80 y.o. male who presents for Medicare Annual/Subsequent preventive examination.  Virtual Visit via Telephone Note  I connected with  Patrick Moss on 10/02/20 at  9:00 AM EDT by telephone and verified that I am speaking with the correct person using two identifiers.  Location: Patient: Home Provider: [facility] Persons participating in the virtual visit: patient/Nurse Health Advisor   I discussed the limitations, risks, security and privacy concerns of performing an evaluation and management service by telephone and the availability of in person appointments. The patient expressed understanding and agreed to proceed.  Interactive audio and video telecommunications were attempted between this nurse and patient, however failed, due to patient having technical difficulties OR patient did not have access to video capability.  We continued and completed visit with audio only.  Some vital signs may be absent or patient reported.   Noya Santarelli E Aliene Tamura, LPN   Review of Systems     Cardiac Risk Factors include: advanced age (>30men, >27 women);diabetes mellitus;dyslipidemia;family history of premature cardiovascular disease;hypertension;male gender;obesity (BMI >30kg/m2);sedentary lifestyle     Objective:    Today's Vitals   10/02/20 0835  Weight: 203 lb (92.1 kg)  Height: 5\' 6"  (1.676 m)   Body mass index is 32.77 kg/m.  Advanced Directives 10/02/2020 09/30/2019 09/18/2014  Does Patient Have a Medical Advance Directive? Yes Yes No  Type of Paramedic of [place];Living will [place];Living will -  Does patient want to make changes to medical advance directive? - No - Patient declined -  Copy of [place] in Chart? No - copy requested No - copy requested -  Would patient like information on creating a medical advance directive? - - No - patient declined information    Current Medications  (verified) Outpatient Encounter Medications as of 10/02/2020  Medication Sig  . ACCU-CHEK SMARTVIEW test strip TEST BLOOD SUGAR TWO TO THREE TIMES DAILY AS DIRECTED  . amLODipine (NORVASC) 5 MG tablet TAKE 1 TABLET (5 MG TOTAL) BY MOUTH DAILY.  Marland Kitchen aspirin 81 MG tablet Take 81 mg by mouth daily.  . B-D ULTRAFINE III SHORT PEN 31G X 8 MM MISC USE  1  PEN  NEEDLE AS DIRECTED  . celecoxib (CELEBREX) 200 MG capsule TAKE 1 CAPSULE (200 MG TOTAL) BY MOUTH DAILY WITH FOOD  . Cholecalciferol (VITAMIN D3) 50 MCG (2000 UT) TABS Take by mouth.  Marland Kitchen glipiZIDE (GLUCOTROL XL) 2.5 MG 24 hr tablet Take 1 tablet (2.5 mg total) by mouth daily with breakfast.  . LANTUS SOLOSTAR 100 UNIT/ML Solostar Pen INJECT 80 UNITS INTO THE SKIN AT BEDTIME (DISCARD USED PEN AFTER 28 DAYS)  . metFORMIN (GLUCOPHAGE-XR) 500 MG 24 hr tablet Take 1,000 mg by mouth daily after breakfast.  . rosuvastatin (CRESTOR) 5 MG tablet Take 1 tablet (5 mg total) by mouth daily.  . valsartan-hydrochlorothiazide (DIOVAN-HCT) 320-25 MG tablet Take 1 tablet by mouth daily. For Blood Pressure   No facility-administered encounter medications on file as of 10/02/2020.    Allergies (verified) Sulfamethoxazole-trimethoprim   History: Past Medical History:  Diagnosis Date  . Diabetes mellitus without complication ([place])   . Hyperlipidemia   . Hypertension    Past Surgical History:  Procedure Laterality Date  . R foot fx     Family History  Problem Relation Age of Onset  . Diabetes Mother   . Diabetes Father   . Diabetes Brother    Social History  Socioeconomic History  . Marital status: Divorced    Spouse name: Not on file  . Number of children: 2  . Years of education: 10  . Highest education level: 10th grade  Occupational History  . Occupation: retired  Tobacco Use  . Smoking status: Never Smoker  . Smokeless tobacco: Never Used  Vaping Use  . Vaping Use: Never used  Substance and Sexual Activity  . Alcohol use: No     Alcohol/week: 0.0 standard drinks  . Drug use: No  . Sexual activity: Not Currently  Other Topics Concern  . Not on file  Social History Narrative   Lives alone - son lives next door   [place] friend on his street - they check on each other several times per day and spend time together   Social Determinants of Health   Financial Resource Strain: Low Risk   . Difficulty of Paying Living Expenses: Not hard at all  Food Insecurity: No Food Insecurity  . Worried About Charity fundraiser in the Last Year: Never true  . Ran Out of Food in the Last Year: Never true  Transportation Needs: No Transportation Needs  . Lack of Transportation (Medical): No  . Lack of Transportation (Non-Medical): No  Physical Activity: Inactive  . Days of Exercise per Week: 0 days  . Minutes of Exercise per Session: 0 min  Stress: No Stress Concern Present  . Feeling of Stress : Only a little  Social Connections: Moderately Integrated  . Frequency of Communication with Friends and Family: More than three times a week  . Frequency of Social Gatherings with Friends and Family: More than three times a week  . Attends Religious Services: More than 4 times per year  . Active Member of Clubs or Organizations: Yes  . Attends Archivist Meetings: More than 4 times per year  . Marital Status: Divorced    Tobacco Counseling Counseling given: Not Answered   Clinical Intake:  Pre-visit preparation completed: Yes  Pain : No/denies pain     BMI - recorded: 32.77 Nutritional Status: BMI > 30  Obese Nutritional Risks: None Diabetes: Yes CBG done?: No Did pt. bring in CBG monitor from home?: No  How often do you need to have someone help you when you read instructions, pamphlets, or other written materials from your doctor or pharmacy?: 1 - Never  Nutrition Risk Assessment:  Has the patient had any N/V/D within the last 2 months?  No  Does the patient have any non-healing wounds?  No  Has the  patient had any unintentional weight loss or weight gain?  No   Diabetes:  Is the patient diabetic?  Yes  If diabetic, was a CBG obtained today?  No  Did the patient bring in their glucometer from home?  No  How often do you monitor your CBG's? BID - 123 today fasting at home per patient..   Financial Strains and Diabetes Management:  Are you having any financial strains with the device, your supplies or your medication? No .  Does the patient want to be seen by Chronic Care Management for management of their diabetes?  No  Would the patient like to be referred to a Nutritionist or for Diabetic Management?  No   Diabetic Exams:  Diabetic Eye Exam: Completed 03/2020. Semi-annual visits with Dr Katy Fitch  Diabetic Foot Exam: Completed 09/27/2019. Pt has been advised about the importance in completing this exam. Pt is scheduled for diabetic foot exam on  10/24/20.    Interpreter Needed?: No  Information entered by :: Jowan Skillin, LPN   Activities of Daily Living In your present state of health, do you have any difficulty performing the following activities: 10/02/2020  Hearing? N  Vision? N  Difficulty concentrating or making decisions? N  Walking or climbing stairs? N  Dressing or bathing? N  Doing errands, shopping? N  Preparing Food and eating ? N  Using the Toilet? N  In the past six months, have you accidently leaked urine? N  Do you have problems with loss of bowel control? N  Managing your Medications? N  Managing your Finances? N  Housekeeping or managing your Housekeeping? N  Some recent data might be hidden    Patient Care Team: [name], MD as PCP - General (Family Medicine)  Indicate any recent Medical Services you may have received from other than [facility] providers in the past year (date may be approximate).     Assessment:   This is a routine wellness examination for [place].  Hearing/Vision screen  Hearing Screening   125Hz  250Hz  500Hz  1000Hz  2000Hz  3000Hz   4000Hz  6000Hz  8000Hz   Right ear:           Left ear:           Comments: Denies hearing difficulties  Vision Screening Comments: Semi-annual visits with Dr Katy Fitch - up to date with eye exams - no vision complaints - wears reading glasses only  Dietary issues and exercise activities discussed: Current Exercise Habits: The patient does not participate in regular exercise at present, Exercise limited by: orthopedic condition(s)  Goals    . Exercise 3x per week (30 min per time)     09/30/2019 AWV Goal: Exercise for General Health   Patient will verbalize understanding of the benefits of increased physical activity:  Exercising regularly is important. It will improve your overall fitness, flexibility, and endurance.  Regular exercise also will improve your overall health. It can help you control your weight, reduce stress, and improve your bone density.  Over the next year, patient will increase physical activity as tolerated with a goal of at least 150 minutes of moderate physical activity per week.   You can tell that you are exercising at a moderate intensity if your heart starts beating faster and you start breathing faster but can still hold a conversation.  Moderate-intensity exercise ideas include:  Walking 1 mile (1.6 km) in about 15 minutes  Biking  Hiking  Golfing  Dancing  Water aerobics  Patient will verbalize understanding of everyday activities that increase physical activity by providing examples like the following: ? Yard work, such as: ? Pushing a Conservation officer, nature ? Raking and bagging leaves ? Washing your car ? Pushing a stroller ? Shoveling snow ? Gardening ? Washing windows or floors  Patient will be able to explain general safety guidelines for exercising:   Before you start a new exercise program, talk with your health care provider.  Do not exercise so much that you hurt yourself, feel dizzy, or get very short of breath.  Wear comfortable clothes and  wear shoes with good support.  Drink plenty of water while you exercise to prevent dehydration or heat stroke.  Work out until your breathing and your heartbeat get faster.   Goals Addressed             This Visit's Progress   . Exercise 3x per week (30 min per time)   On track  09/30/2019 AWV Goal: Exercise for General Health   Patient will verbalize understanding of the benefits of increased physical activity:  Exercising regularly is important. It will improve your overall fitness, flexibility, and endurance.  Regular exercise also will improve your overall health. It can help you control your weight, reduce stress, and improve your bone density.  Over the next year, patient will increase physical activity as tolerated with a goal of at least 150 minutes of moderate physical activity per week.   You can tell that you are exercising at a moderate intensity if your heart starts beating faster and you start breathing faster but can still hold a conversation.  Moderate-intensity exercise ideas include:  Walking 1 mile (1.6 km) in about 15 minutes  Biking  Hiking  Golfing  Dancing  Water aerobics  Patient will verbalize understanding of everyday activities that increase physical activity by providing examples like the following: ? Yard work, such as: ? Pushing a Conservation officer, nature ? Raking and bagging leaves ? Washing your car ? Pushing a stroller ? Shoveling snow ? Gardening ? Washing windows or floors  Patient will be able to explain general safety guidelines for exercising:   Before you start a new exercise program, talk with your health care provider.  Do not exercise so much that you hurt yourself, feel dizzy, or get very short of breath.  Wear comfortable clothes and wear shoes with good support.  Drink plenty of water while you exercise to prevent dehydration or heat stroke.  Work out until your breathing and your heartbeat get faster.                 Depression Screen PHQ 2/9 Scores 10/02/2020 09/10/2020 07/26/2020 03/29/2020 09/30/2019 09/27/2019 03/29/2019  PHQ - 2 Score 0 0 0 0 0 0 0    Fall Risk Fall Risk  10/02/2020 09/10/2020 07/26/2020 03/29/2020 09/30/2019  Falls in the past year? 1 1 0 0 0  Number falls in past yr: 1 1 - - 0  Injury with Fall? 1 1 - - 0  Risk for fall due to : Impaired balance/gait;History of fall(s);Orthopedic patient History of fall(s);Impaired balance/gait No Fall Risks No Fall Risks No Fall Risks  Follow up Falls prevention discussed;Education provided - Falls evaluation completed Falls evaluation completed Falls evaluation completed    FALL RISK PREVENTION PERTAINING TO THE HOME:  Any stairs in or around the home? Yes  If so, are there any without handrails? No  Home free of loose throw rugs in walkways, pet beds, electrical cords, etc? Yes  Adequate lighting in your home to reduce risk of falls? Yes   ASSISTIVE DEVICES UTILIZED TO PREVENT FALLS:  Life alert? No  Use of a cane, walker or w/c? No  Grab bars in the bathroom? Yes  Shower chair or bench in shower? Yes  Elevated toilet seat or a handicapped toilet? Yes   TIMED UP AND GO:  Was the test performed? No .    Cognitive Function:     6CIT Screen 10/02/2020 09/30/2019  What Year? 0 points 0 points  What month? 0 points 0 points  What time? 0 points 0 points  Count back from 20 0 points 0 points  Months in reverse 0 points 0 points  Repeat phrase 0 points 2 points  Total Score 0 2    Immunizations Immunization History  Administered Date(s) Administered  . Fluad Quad(high Dose 65+) 03/29/2020  . Influenza, High Dose Seasonal PF 03/28/2015, 04/07/2016, 04/30/2016, 03/20/2017,  03/17/2018, 03/07/2019  . Influenza,inj,Quad PF,6+ Mos 04/07/2016, 04/30/2016, 03/20/2017, 03/17/2018  . Moderna Sars-Covid-2 Vaccination 07/06/2019, 08/03/2019, 05/02/2020  . Pneumococcal Conjugate-13 06/30/2016  . Pneumococcal Polysaccharide-23 07/15/2017  .  Tdap 12/27/2014    TDAP status: Up to date  Flu Vaccine status: Up to date  Pneumococcal vaccine status: Up to date  Covid-19 vaccine status: Completed vaccines  Qualifies for Shingles Vaccine? Yes   Zostavax completed Yes   Shingrix Completed?: No.    Education has been provided regarding the importance of this vaccine. Patient has been advised to call insurance company to determine out of pocket expense if they have not yet received this vaccine. Advised may also receive vaccine at local pharmacy or Health Dept. Verbalized acceptance and understanding.  Screening Tests Health Maintenance  Topic Date Due  . OPHTHALMOLOGY EXAM  09/14/2020  . FOOT EXAM  09/26/2020  . INFLUENZA VACCINE  01/14/2021  . HEMOGLOBIN A1C  01/23/2021  . TETANUS/TDAP  12/26/2024  . COVID-19 Vaccine  Completed  . Hepatitis C Screening  Completed  . PNA vac Low Risk Adult  Completed  . HPV VACCINES  Aged Out    Health Maintenance  Health Maintenance Due  Topic Date Due  . OPHTHALMOLOGY EXAM  09/14/2020  . FOOT EXAM  09/26/2020    Colorectal cancer screening: No longer required.   Lung Cancer Screening: (Low Dose CT Chest recommended if Age 18-80 years, 30 pack-year currently smoking OR have quit w/in 15years.) does not qualify.   Additional Screening:  Hepatitis C Screening: does qualify; Completed 03/29/2020  Vision Screening: Recommended annual ophthalmology exams for early detection of glaucoma and other disorders of the eye. Is the patient up to date with their annual eye exam?  Yes  Who is the provider or what is the name of the office in which the patient attends annual eye exams? Dr Katy Fitch If pt is not established with a provider, would they like to be referred to a provider to establish care? No .   Dental Screening: Recommended annual dental exams for proper oral hygiene  Community Resource Referral / Chronic Care Management: CRR required this visit?  No   CCM required this visit?   No      Plan:     I have personally reviewed and noted the following in the patient's chart:   . Medical and social history . Use of alcohol, tobacco or illicit drugs  . Current medications and supplements . Functional ability and status . Nutritional status . Physical activity . Advanced directives . List of other physicians . Hospitalizations, surgeries, and ER visits in previous 12 months . Vitals . Screenings to include cognitive, depression, and falls . Referrals and appointments  In addition, I have reviewed and discussed with patient certain preventive protocols, quality metrics, and best practice recommendations. A written personalized care plan for preventive services as well as general preventive health recommendations were provided to patient.     Sandrea Hammond, LPN   7/34/2876   Nurse Notes: None

## 2020-10-02 NOTE — Patient Instructions (Signed)
Patrick Moss , Thank you for taking time to come for your Medicare Wellness Visit. I appreciate your ongoing commitment to your health goals. Please review the following plan we discussed and let me know if I can assist you in the future.   Screening recommendations/referrals: Colonoscopy: No longer required Recommended yearly ophthalmology/optometry visit for glaucoma screening and checkup Recommended yearly dental visit for hygiene and checkup  Vaccinations: Influenza vaccine: Done 03/29/2020 - Repeat annually Pneumococcal vaccine: Done 06/30/2016 & 07/15/2017 Tdap vaccine: Done 12/27/2014 - Repeat every 10 years Shingles vaccine: Shingrix discussed. Please contact your pharmacy for coverage information.    Covid-19: Done 07/06/19, 08/03/19, & 05/02/2020  Advanced directives: Please bring a copy of your health care power of attorney and living will to the office to be added to your chart at your convenience.  Conditions/risks identified: Try to increase physical activity to 30 minutes each day, eat plenty of fruits and vegetables, limit sugars and carbs and drink 6-8 glasses of water per day.  Next appointment: Follow up in one year for your annual wellness visit.   Preventive Care 80 Years and Older, Male  Preventive care refers to lifestyle choices and visits with your health care provider that can promote health and wellness. What does preventive care include?  A yearly physical exam. This is also called an annual well check.  Dental exams once or twice a year.  Routine eye exams. Ask your health care provider how often you should have your eyes checked.  Personal lifestyle choices, including:  Daily care of your teeth and gums.  Regular physical activity.  Eating a healthy diet.  Avoiding tobacco and drug use.  Limiting alcohol use.  Practicing safe sex.  Taking low doses of aspirin every day.  Taking vitamin and mineral supplements as recommended by your health care  provider. What happens during an annual well check? The services and screenings done by your health care provider during your annual well check will depend on your age, overall health, lifestyle risk factors, and family history of disease. Counseling  Your health care provider may ask you questions about your:  Alcohol use.  Tobacco use.  Drug use.  Emotional well-being.  Home and relationship well-being.  Sexual activity.  Eating habits.  History of falls.  Memory and ability to understand (cognition).  Work and work Statistician. Screening  You may have the following tests or measurements:  Height, weight, and BMI.  Blood pressure.  Lipid and cholesterol levels. These may be checked every 5 years, or more frequently if you are over 38 years old.  Skin check.  Lung cancer screening. You may have this screening every year starting at age 65 if you have a 30-pack-year history of smoking and currently smoke or have quit within the past 15 years.  Fecal occult blood test (FOBT) of the stool. You may have this test every year starting at age 108.  Flexible sigmoidoscopy or colonoscopy. You may have a sigmoidoscopy every 5 years or a colonoscopy every 10 years starting at age 48.  Prostate cancer screening. Recommendations will vary depending on your family history and other risks.  Hepatitis C blood test.  Hepatitis B blood test.  Sexually transmitted disease (STD) testing.  Diabetes screening. This is done by checking your blood sugar (glucose) after you have not eaten for a while (fasting). You may have this done every 1-3 years.  Abdominal aortic aneurysm (AAA) screening. You may need this if you are a current or former  smoker.  Osteoporosis. You may be screened starting at age 26 if you are at high risk. Talk with your health care provider about your test results, treatment options, and if necessary, the need for more tests. Vaccines  Your health care provider  may recommend certain vaccines, such as:  Influenza vaccine. This is recommended every year.  Tetanus, diphtheria, and acellular pertussis (Tdap, Td) vaccine. You may need a Td booster every 10 years.  Zoster vaccine. You may need this after age 40.  Pneumococcal 13-valent conjugate (PCV13) vaccine. One dose is recommended after age 23.  Pneumococcal polysaccharide (PPSV23) vaccine. One dose is recommended after age 85. Talk to your health care provider about which screenings and vaccines you need and how often you need them. This information is not intended to replace advice given to you by your health care provider. Make sure you discuss any questions you have with your health care provider. Document Released: 06/29/2015 Document Revised: 02/20/2016 Document Reviewed: 04/03/2015 Elsevier Interactive Patient Education  2017 [employer] Prevention in the Home Falls can cause injuries. They can happen to people of all ages. There are many things you can do to make your home safe and to help prevent falls. What can I do on the outside of my home?  Regularly fix the edges of walkways and driveways and fix any cracks.  Remove anything that might make you trip as you walk through a door, such as a raised step or threshold.  Trim any bushes or trees on the path to your home.  Use bright outdoor lighting.  Clear any walking paths of anything that might make someone trip, such as rocks or tools.  Regularly check to see if handrails are loose or broken. Make sure that both sides of any steps have handrails.  Any raised decks and porches should have guardrails on the edges.  Have any leaves, snow, or ice cleared regularly.  Use sand or salt on walking paths during winter.  Clean up any spills in your garage right away. This includes oil or grease spills. What can I do in the bathroom?  Use night lights.  Install grab bars by the toilet and in the tub and shower. Do not use  towel bars as grab bars.  Use non-skid mats or decals in the tub or shower.  If you need to sit down in the shower, use a plastic, non-slip stool.  Keep the floor dry. Clean up any water that spills on the floor as soon as it happens.  Remove soap buildup in the tub or shower regularly.  Attach bath mats securely with double-sided non-slip rug tape.  Do not have throw rugs and other things on the floor that can make you trip. What can I do in the bedroom?  Use night lights.  Make sure that you have a light by your bed that is easy to reach.  Do not use any sheets or blankets that are too big for your bed. They should not hang down onto the floor.  Have a firm chair that has side arms. You can use this for support while you get dressed.  Do not have throw rugs and other things on the floor that can make you trip. What can I do in the kitchen?  Clean up any spills right away.  Avoid walking on wet floors.  Keep items that you use a lot in easy-to-reach places.  If you need to reach something above you, use a  strong step stool that has a grab bar.  Keep electrical cords out of the way.  Do not use floor polish or wax that makes floors slippery. If you must use wax, use non-skid floor wax.  Do not have throw rugs and other things on the floor that can make you trip. What can I do with my stairs?  Do not leave any items on the stairs.  Make sure that there are handrails on both sides of the stairs and use them. Fix handrails that are broken or loose. Make sure that handrails are as long as the stairways.  Check any carpeting to make sure that it is firmly attached to the stairs. Fix any carpet that is loose or worn.  Avoid having throw rugs at the top or bottom of the stairs. If you do have throw rugs, attach them to the floor with carpet tape.  Make sure that you have a light switch at the top of the stairs and the bottom of the stairs. If you do not have them, ask  someone to add them for you. What else can I do to help prevent falls?  Wear shoes that:  Do not have high heels.  Have rubber bottoms.  Are comfortable and fit you well.  Are closed at the toe. Do not wear sandals.  If you use a stepladder:  Make sure that it is fully opened. Do not climb a closed stepladder.  Make sure that both sides of the stepladder are locked into place.  Ask someone to hold it for you, if possible.  Clearly mark and make sure that you can see:  Any grab bars or handrails.  First and last steps.  Where the edge of each step is.  Use tools that help you move around (mobility aids) if they are needed. These include:  Canes.  Walkers.  Scooters.  Crutches.  Turn on the lights when you go into a dark area. Replace any light bulbs as soon as they burn out.  Set up your furniture so you have a clear path. Avoid moving your furniture around.  If any of your floors are uneven, fix them.  If there are any pets around you, be aware of where they are.  Review your medicines with your doctor. Some medicines can make you feel dizzy. This can increase your chance of falling. Ask your doctor what other things that you can do to help prevent falls. This information is not intended to replace advice given to you by your health care provider. Make sure you discuss any questions you have with your health care provider. Document Released: 03/29/2009 Document Revised: 11/08/2015 Document Reviewed: 07/07/2014 Elsevier Interactive Patient Education  2017 [employer].

## 2020-10-24 ENCOUNTER — Encounter: Payer: Self-pay | Admitting: Family Medicine

## 2020-10-24 ENCOUNTER — Ambulatory Visit (INDEPENDENT_AMBULATORY_CARE_PROVIDER_SITE_OTHER): Payer: Medicare HMO | Admitting: Family Medicine

## 2020-10-24 ENCOUNTER — Other Ambulatory Visit: Payer: Self-pay

## 2020-10-24 VITALS — BP 159/69 | HR 67 | Temp 97.8°F | Ht 66.0 in | Wt 205.6 lb

## 2020-10-24 DIAGNOSIS — E1149 Type 2 diabetes mellitus with other diabetic neurological complication: Secondary | ICD-10-CM | POA: Diagnosis not present

## 2020-10-24 DIAGNOSIS — E782 Mixed hyperlipidemia: Secondary | ICD-10-CM

## 2020-10-24 DIAGNOSIS — I10 Essential (primary) hypertension: Secondary | ICD-10-CM

## 2020-10-24 LAB — BAYER DCA HB A1C WAIVED: HB A1C (BAYER DCA - WAIVED): 8 % — ABNORMAL HIGH (ref <7.0)

## 2020-10-24 NOTE — Progress Notes (Signed)
Subjective:  Patient ID: Patrick Moss, male    DOB: [birth year]  Age: 80 y.o. MRN: [id number]  CC: Medical Management of Chronic Issues   HPI Audel L Clapham presents forFollow-up of diabetes. Patient checks blood sugar at home.   100-130 fasting and 140-180 postprandial Log attached. Patient denies symptoms such as polyuria, polydipsia, excessive hunger, nausea No significant hypoglycemic spells noted. Medications reviewed. Pt reports taking them regularly without complication/adverse reaction being reported today.    presents for  follow-up of hypertension. Patient has no history of headache chest pain or shortness of breath or recent cough. Patient also denies symptoms of TIA such as focal numbness or weakness. Patient denies side effects from medication. States taking it regularly.   in for follow-up of elevated cholesterol. Doing well without complaints on current medication. Denies side effects of statin including myalgia and arthralgia and nausea. Currently no chest pain, shortness of breath or other cardiovascular related symptoms noted.  History Ahmari has a past medical history of Diabetes mellitus without complication ([place]), Hyperlipidemia, and Hypertension.   He has a past surgical history that includes R foot fx.   His family history includes Diabetes in his brother, father, and mother.He reports that he has never smoked. He has never used smokeless tobacco. He reports that he does not drink alcohol and does not use drugs.  Current Outpatient Medications on File Prior to Visit  Medication Sig Dispense Refill  . ACCU-CHEK SMARTVIEW test strip TEST BLOOD SUGAR TWO TO THREE TIMES DAILY AS DIRECTED 300 each 1  . amLODipine (NORVASC) 5 MG tablet TAKE 1 TABLET (5 MG TOTAL) BY MOUTH DAILY. 90 tablet 1  . aspirin 81 MG tablet Take 81 mg by mouth daily.    . B-D ULTRAFINE III SHORT PEN 31G X 8 MM MISC USE  1  PEN  NEEDLE AS DIRECTED 270 each 3  . celecoxib (CELEBREX) 200 MG  capsule TAKE 1 CAPSULE (200 MG TOTAL) BY MOUTH DAILY WITH FOOD 90 capsule 0  . Cholecalciferol (VITAMIN D3) 50 MCG (2000 UT) TABS Take by mouth.    Marland Kitchen glipiZIDE (GLUCOTROL XL) 2.5 MG 24 hr tablet Take 1 tablet (2.5 mg total) by mouth daily with breakfast. 90 tablet 1  . LANTUS SOLOSTAR 100 UNIT/ML Solostar Pen INJECT 80 UNITS INTO THE SKIN AT BEDTIME (DISCARD USED PEN AFTER 28 DAYS) 75 mL 0  . metFORMIN (GLUCOPHAGE-XR) 500 MG 24 hr tablet Take 1,000 mg by mouth daily after breakfast.    . rosuvastatin (CRESTOR) 5 MG tablet Take 1 tablet (5 mg total) by mouth daily. 90 tablet 3  . valsartan-hydrochlorothiazide (DIOVAN-HCT) 320-25 MG tablet Take 1 tablet by mouth daily. For Blood Pressure 90 tablet 3   No current facility-administered medications on file prior to visit.    ROS Review of Systems  Constitutional: Negative for fever.  Respiratory: Negative for shortness of breath.   Cardiovascular: Negative for chest pain.  Musculoskeletal: Negative for arthralgias.  Skin: Negative for rash.    Objective:  BP (!) 159/69   Pulse 67   Temp 97.8 F (36.6 C)   Ht 5' 6"  (1.676 m)   Wt 205 lb 9.6 oz (93.3 kg)   SpO2 100%   BMI 33.18 kg/m   BP Readings from Last 3 Encounters:  10/24/20 (!) 159/69  09/10/20 118/66  08/15/20 (!) 142/76    Wt Readings from Last 3 Encounters:  10/24/20 205 lb 9.6 oz (93.3 kg)  10/02/20 203 lb (92.1 kg)  09/10/20 201 lb 12.8 oz (91.5 kg)     Physical Exam Vitals reviewed.  Constitutional:      Appearance: He is well-developed.  HENT:     Head: Normocephalic and atraumatic.     Right Ear: Tympanic membrane and external ear normal. No decreased hearing noted.     Left Ear: Tympanic membrane and external ear normal. No decreased hearing noted.     Mouth/Throat:     Pharynx: No oropharyngeal exudate or posterior oropharyngeal erythema.  Eyes:     Pupils: Pupils are equal, round, and reactive to light.  Cardiovascular:     Rate and Rhythm: Normal  rate and regular rhythm.     Heart sounds: No murmur heard.   Pulmonary:     Effort: No respiratory distress.     Breath sounds: Normal breath sounds.  Abdominal:     General: Bowel sounds are normal.     Palpations: Abdomen is soft. There is no mass.     Tenderness: There is no abdominal tenderness.  Musculoskeletal:     Cervical back: Normal range of motion and neck supple.       Assessment & Plan:   Augustin was seen today for medical management of chronic issues.  Diagnoses and all orders for this visit:  Mixed hyperlipidemia -     Lipid panel  Type 2 diabetes mellitus with neurological complications ([place]) -     Bayer DCA Hb A1c Waived -     CMP14+EGFR -     CBC with Differential/Platelet  Essential hypertension, benign      I am having Gomer L. Macomber maintain his aspirin, metFORMIN, B-D ULTRAFINE III SHORT PEN, Accu-Chek SmartView, amLODipine, valsartan-hydrochlorothiazide, rosuvastatin, Vitamin D3, glipiZIDE, Lantus SoloStar, and celecoxib.  No orders of the defined types were placed in this encounter.    Follow-up: Return in about 3 months (around 01/24/2021).  [name], M.D.

## 2020-10-25 LAB — CMP14+EGFR
ALT: 24 IU/L (ref 0–44)
AST: 19 IU/L (ref 0–40)
Albumin/Globulin Ratio: 1.9 (ref 1.2–2.2)
Albumin: 4.6 g/dL (ref 3.7–4.7)
Alkaline Phosphatase: 76 IU/L (ref 44–121)
BUN/Creatinine Ratio: 13 (ref 10–24)
BUN: 18 mg/dL (ref 8–27)
Bilirubin Total: 0.5 mg/dL (ref 0.0–1.2)
CO2: 24 mmol/L (ref 20–29)
Calcium: 10.1 mg/dL (ref 8.6–10.2)
Chloride: 103 mmol/L (ref 96–106)
Creatinine, Ser: 1.36 mg/dL — ABNORMAL HIGH (ref 0.76–1.27)
Globulin, Total: 2.4 g/dL (ref 1.5–4.5)
Glucose: 146 mg/dL — ABNORMAL HIGH (ref 65–99)
Potassium: 4.9 mmol/L (ref 3.5–5.2)
Sodium: 141 mmol/L (ref 134–144)
Total Protein: 7 g/dL (ref 6.0–8.5)
eGFR: 53 mL/min/{1.73_m2} — ABNORMAL LOW (ref >59)

## 2020-10-25 LAB — CBC WITH DIFFERENTIAL/PLATELET
Basophils Absolute: 0 10*3/uL (ref 0.0–0.2)
Basos: 1 %
EOS (ABSOLUTE): 0.1 10*3/uL (ref 0.0–0.4)
Eos: 2 %
Hematocrit: 44.2 % (ref 37.5–51.0)
Hemoglobin: 15.2 g/dL (ref 13.0–17.7)
Immature Grans (Abs): 0 10*3/uL (ref 0.0–0.1)
Immature Granulocytes: 0 %
Lymphocytes Absolute: 2.5 10*3/uL (ref 0.7–3.1)
Lymphs: 36 %
MCH: 30.5 pg (ref 26.6–33.0)
MCHC: 34.4 g/dL (ref 31.5–35.7)
MCV: 89 fL (ref 79–97)
Monocytes Absolute: 0.4 10*3/uL (ref 0.1–0.9)
Monocytes: 6 %
Neutrophils Absolute: 3.8 10*3/uL (ref 1.4–7.0)
Neutrophils: 55 %
Platelets: 164 10*3/uL (ref 150–450)
RBC: 4.98 x10E6/uL (ref 4.14–5.80)
RDW: 12.9 % (ref 11.6–15.4)
WBC: 6.9 10*3/uL (ref 3.4–10.8)

## 2020-10-25 LAB — LIPID PANEL
Chol/HDL Ratio: 3.8 ratio (ref 0.0–5.0)
Cholesterol, Total: 139 mg/dL (ref 100–199)
HDL: 37 mg/dL — ABNORMAL LOW (ref >39)
LDL Chol Calc (NIH): 67 mg/dL (ref 0–99)
Triglycerides: 209 mg/dL — ABNORMAL HIGH (ref 0–149)
VLDL Cholesterol Cal: 35 mg/dL (ref 5–40)

## 2020-10-25 NOTE — Progress Notes (Signed)
Hello Patrick Moss,  Your lab result is normal and/or stable.Some minor variations that are not significant are commonly marked abnormal, but do not represent any medical problem for you.  Best regards, Scarlet Abad, M.D.

## 2020-11-02 ENCOUNTER — Ambulatory Visit: Payer: Medicare HMO | Admitting: Pharmacist

## 2020-11-15 ENCOUNTER — Encounter: Payer: Self-pay | Admitting: "Endocrinology

## 2020-11-15 ENCOUNTER — Ambulatory Visit (INDEPENDENT_AMBULATORY_CARE_PROVIDER_SITE_OTHER): Payer: Medicare HMO | Admitting: "Endocrinology

## 2020-11-15 ENCOUNTER — Other Ambulatory Visit: Payer: Self-pay

## 2020-11-15 VITALS — BP 138/62 | HR 68 | Ht 66.0 in | Wt 206.4 lb

## 2020-11-15 DIAGNOSIS — E559 Vitamin D deficiency, unspecified: Secondary | ICD-10-CM

## 2020-11-15 DIAGNOSIS — E1165 Type 2 diabetes mellitus with hyperglycemia: Secondary | ICD-10-CM | POA: Diagnosis not present

## 2020-11-15 DIAGNOSIS — E782 Mixed hyperlipidemia: Secondary | ICD-10-CM

## 2020-11-15 DIAGNOSIS — I10 Essential (primary) hypertension: Secondary | ICD-10-CM | POA: Diagnosis not present

## 2020-11-15 MED ORDER — GLIPIZIDE ER 5 MG PO TB24: 5.0000 mg | ORAL_TABLET | Freq: Every day | ORAL | 1 refills | Status: DC

## 2020-11-15 NOTE — Patient Instructions (Signed)

## 2020-11-15 NOTE — Progress Notes (Signed)
04/07/2019       Endocrinology follow-up note   Subjective:    Patient ID: Patrick Moss, male    DOB: [birth year], PCP [name], MD   Past Medical History:  Diagnosis Date  . Diabetes mellitus without complication ([place])   . Hyperlipidemia   . Hypertension    Past Surgical History:  Procedure Laterality Date  . R foot fx     Social History   Socioeconomic History  . Marital status: Divorced    Spouse name: Not on file  . Number of children: 2  . Years of education: 10  . Highest education level: 10th grade  Occupational History  . Occupation: retired  Tobacco Use  . Smoking status: Never Smoker  . Smokeless tobacco: Never Used  Vaping Use  . Vaping Use: Never used  Substance and Sexual Activity  . Alcohol use: No    Alcohol/week: 0.0 standard drinks  . Drug use: No  . Sexual activity: Not Currently  Other Topics Concern  . Not on file  Social History Narrative   Lives alone - son lives next door   [place] friend on his street - they check on each other several times per day and spend time together   Social Determinants of Health   Financial Resource Strain: Low Risk   . Difficulty of Paying Living Expenses: Not hard at all  Food Insecurity: No Food Insecurity  . Worried About Charity fundraiser in the Last Year: Never true  . Ran Out of Food in the Last Year: Never true  Transportation Needs: No Transportation Needs  . Lack of Transportation (Medical): No  . Lack of Transportation (Non-Medical): No  Physical Activity: Inactive  . Days of Exercise per Week: 0 days  . Minutes of Exercise per Session: 0 min  Stress: No Stress Concern Present  . Feeling of Stress : Only a little  Social Connections: Moderately Integrated  . Frequency of Communication with Friends and Family: More than three times a week  . Frequency of Social Gatherings with Friends and Family: More than three times a week  . Attends Religious Services: More than 4 times per year  .  Active Member of Clubs or Organizations: Yes  . Attends Archivist Meetings: More than 4 times per year  . Marital Status: Divorced   Outpatient Encounter Medications as of 11/15/2020  Medication Sig  . ACCU-CHEK SMARTVIEW test strip TEST BLOOD SUGAR TWO TO THREE TIMES DAILY AS DIRECTED  . amLODipine (NORVASC) 5 MG tablet TAKE 1 TABLET (5 MG TOTAL) BY MOUTH DAILY.  Marland Kitchen aspirin 81 MG tablet Take 81 mg by mouth daily.  . B-D ULTRAFINE III SHORT PEN 31G X 8 MM MISC USE  1  PEN  NEEDLE AS DIRECTED  . celecoxib (CELEBREX) 200 MG capsule TAKE 1 CAPSULE (200 MG TOTAL) BY MOUTH DAILY WITH FOOD  . Cholecalciferol (VITAMIN D3) 50 MCG (2000 UT) TABS Take by mouth.  Marland Kitchen glipiZIDE (GLUCOTROL XL) 2.5 MG 24 hr tablet Take 1 tablet (2.5 mg total) by mouth daily with breakfast.  . LANTUS SOLOSTAR 100 UNIT/ML Solostar Pen INJECT 80 UNITS INTO THE SKIN AT BEDTIME (DISCARD USED PEN AFTER 28 DAYS)  . metFORMIN (GLUCOPHAGE-XR) 500 MG 24 hr tablet Take 1,000 mg by mouth daily after breakfast.  . rosuvastatin (CRESTOR) 5 MG tablet Take 1 tablet (5 mg total) by mouth daily.  . valsartan-hydrochlorothiazide (DIOVAN-HCT) 320-25 MG tablet Take 1 tablet by mouth daily. For Blood  Pressure   No facility-administered encounter medications on file as of 11/15/2020.   ALLERGIES: Allergies  Allergen Reactions  . Sulfamethoxazole-Trimethoprim Hives   VACCINATION STATUS: Immunization History  Administered Date(s) Administered  . Fluad Quad(high Dose 65+) 03/29/2020  . Influenza, High Dose Seasonal PF 03/28/2015, 04/07/2016, 04/30/2016, 03/20/2017, 03/17/2018, 03/07/2019  . Influenza,inj,Quad PF,6+ Mos 04/07/2016, 04/30/2016, 03/20/2017, 03/17/2018  . Moderna Sars-Covid-2 Vaccination 07/06/2019, 08/03/2019, 05/02/2020  . Pneumococcal Conjugate-13 06/30/2016  . Pneumococcal Polysaccharide-23 07/15/2017  . Tdap 12/27/2014    Diabetes He presents for his follow-up diabetic visit. He has type 2 diabetes mellitus.  Onset time: He was diagnosed at approximate age of 54 years. His disease course has been improving. There are no hypoglycemic associated symptoms. Pertinent negatives for hypoglycemia include no confusion, headaches, pallor or seizures. Associated symptoms include polydipsia and polyuria. Pertinent negatives for diabetes include no chest pain, no fatigue, no polyphagia and no weakness. There are no hypoglycemic complications. Symptoms are improving. There are no diabetic complications. Risk factors for coronary artery disease include diabetes mellitus, dyslipidemia, male sex, obesity and sedentary lifestyle. He is compliant with treatment most of the time. His weight is fluctuating minimally. He is following a generally unhealthy diet. When asked about meal planning, he reported none. He participates in exercise intermittently. His home blood glucose trend is decreasing steadily. His breakfast blood glucose range is generally 130-140 mg/dl. His bedtime blood glucose range is generally 140-180 mg/dl. His overall blood glucose range is 140-180 mg/dl. (Mr. Viviano presents with slight improvement in his glycemic profile both fasting and at bedtime.  His previsit labs show A1c of 8%, improving from 8.4%.  He did not document hypoglycemia. ) An ACE inhibitor/angiotensin II receptor blocker is being taken.  Hyperlipidemia This is a chronic problem. The current episode started more than 1 year ago. The problem is uncontrolled. Exacerbating diseases include diabetes and obesity. Pertinent negatives include no chest pain, myalgias or shortness of breath. Current antihyperlipidemic treatment includes statins. Risk factors for coronary artery disease include dyslipidemia, hypertension, male sex, obesity and a sedentary lifestyle.  Hypertension This is a chronic problem. The current episode started more than 1 year ago. The problem is uncontrolled. Pertinent negatives include no chest pain, headaches, neck pain,  palpitations or shortness of breath. Risk factors for coronary artery disease include dyslipidemia, diabetes mellitus, obesity and sedentary lifestyle. Past treatments include ACE inhibitors and angiotensin blockers.    Review of systems  Constitutional: + Minimally fluctuating body weight,  current  Body mass index is 33.31 kg/m. , no fatigue, no subjective hyperthermia, no subjective hypothermia    Objective:    Ht 5' 6"  (1.676 m)   Wt 206 lb 6.4 oz (93.6 kg)   BMI 33.31 kg/m   Wt Readings from Last 3 Encounters:  11/15/20 206 lb 6.4 oz (93.6 kg)  10/24/20 205 lb 9.6 oz (93.3 kg)  10/02/20 203 lb (92.1 kg)      Physical Exam- Limited  Constitutional:  Body mass index is 33.31 kg/m. , not in acute distress, normal state of mind    Recent Results (from the past 2160 hour(s))  HM DIABETES EYE EXAM     Status: None   Collection Time: 09/11/20 12:00 AM  Result Value Ref Range   HM Diabetic Eye Exam No Retinopathy No Retinopathy    Comment: Wallis Mart, MD  [facility] Hb A1c Waived     Status: Abnormal   Collection Time: 10/24/20  8:57 AM  Result Value Ref Range  HB A1C (BAYER DCA - WAIVED) 8.0 (H) <7.0 %    Comment:                                       Diabetic Adult            <7.0                                       Healthy Adult        4.3 - 5.7                                                           (DCCT/NGSP) American Diabetes Association's Summary of Glycemic Recommendations for Adults with Diabetes: Hemoglobin A1c <7.0%. More stringent glycemic goals (A1c <6.0%) may further reduce complications at the cost of increased risk of hypoglycemia.   CMP14+EGFR     Status: Abnormal   Collection Time: 10/24/20  8:57 AM  Result Value Ref Range   Glucose 146 (H) 65 - 99 mg/dL   BUN 18 8 - 27 mg/dL   Creatinine, Ser 1.36 (H) 0.76 - 1.27 mg/dL   eGFR 53 (L) >59 mL/min/1.73   BUN/Creatinine Ratio 13 10 - 24   Sodium 141 134 - 144 mmol/L   Potassium 4.9 3.5 - 5.2  mmol/L   Chloride 103 96 - 106 mmol/L   CO2 24 20 - 29 mmol/L   Calcium 10.1 8.6 - 10.2 mg/dL   Total Protein 7.0 6.0 - 8.5 g/dL   Albumin 4.6 3.7 - 4.7 g/dL   Globulin, Total 2.4 1.5 - 4.5 g/dL   Albumin/Globulin Ratio 1.9 1.2 - 2.2   Bilirubin Total 0.5 0.0 - 1.2 mg/dL   Alkaline Phosphatase 76 44 - 121 IU/L   AST 19 0 - 40 IU/L   ALT 24 0 - 44 IU/L  CBC with Differential/Platelet     Status: None   Collection Time: 10/24/20  8:57 AM  Result Value Ref Range   WBC 6.9 3.4 - 10.8 x10E3/uL   RBC 4.98 4.14 - 5.80 x10E6/uL   Hemoglobin 15.2 13.0 - 17.7 g/dL   Hematocrit 44.2 37.5 - 51.0 %   MCV 89 79 - 97 fL   MCH 30.5 26.6 - 33.0 pg   MCHC 34.4 31.5 - 35.7 g/dL   RDW 12.9 11.6 - 15.4 %   Platelets 164 150 - 450 x10E3/uL   Neutrophils 55 Not Estab. %   Lymphs 36 Not Estab. %   Monocytes 6 Not Estab. %   Eos 2 Not Estab. %   Basos 1 Not Estab. %   Neutrophils Absolute 3.8 1.4 - 7.0 x10E3/uL   Lymphocytes Absolute 2.5 0.7 - 3.1 x10E3/uL   Monocytes Absolute 0.4 0.1 - 0.9 x10E3/uL   EOS (ABSOLUTE) 0.1 0.0 - 0.4 x10E3/uL   Basophils Absolute 0.0 0.0 - 0.2 x10E3/uL   Immature Granulocytes 0 Not Estab. %   Immature Grans (Abs) 0.0 0.0 - 0.1 x10E3/uL  Lipid panel     Status: Abnormal   Collection Time: 10/24/20  8:57 AM  Result Value Ref Range   Cholesterol, Total 139  100 - 199 mg/dL   Triglycerides 209 (H) 0 - 149 mg/dL   HDL 37 (L) >39 mg/dL   VLDL Cholesterol Cal 35 5 - 40 mg/dL   LDL Chol Calc (NIH) 67 0 - 99 mg/dL   Chol/HDL Ratio 3.8 0.0 - 5.0 ratio    Comment:                                   T. Chol/HDL Ratio                                             Men  Women                               1/2 Avg.Risk  3.4    3.3                                   Avg.Risk  5.0    4.4                                2X Avg.Risk  9.6    7.1                                3X Avg.Risk 23.4   11.0     Assessment & Plan:   1. Uncontrolled type 2 diabetes mellitus without  complication, without long-term current use of insulin (HCC)   Pt remains at a high risk for more acute and chronic complications of diabetes which include CAD, CVA, CKD, retinopathy, and neuropathy. These are all discussed in detail with the patient.  Mr. Roop presents with slight improvement in his glycemic profile both fasting and at bedtime.  His previsit labs show A1c of 8%, improving from 8.4%.  He did not document hypoglycemia.   -  Recent labs reviewed. - I have re-counseled the patient on diet management and weight loss  by adopting a carbohydrate restricted / protein rich  Diet.  - he acknowledges that there is a room for improvement in his food and drink choices. - Suggestion is made for him to avoid simple carbohydrates  from his diet including Cakes, Sweet Desserts, Ice Cream, Soda (diet and regular), Sweet Tea, Candies, Chips, Cookies, Store Bought Juices, Alcohol in Excess of  1-2 drinks a day, Artificial Sweeteners,  Coffee Creamer, and "Sugar-free" Products, Lemonade. This will help patient to have more stable blood glucose profile and potentially avoid unintended weight gain.   - Patient is advised to stick to a routine mealtimes to eat 3 meals  a day and avoid unnecessary snacks ( to snack only to correct hypoglycemia).  - I have approached patient with the following individualized plan to manage diabetes and patient agrees.   -Based on his presentation with tightly controlled fasting glycemic profile he will not tolerate any higher dose of basal insulin.  He is advised to continue Lantus 80 units nightly, associated with monitoring of blood glucose twice a day-daily before breakfast and at bedtime.      -Due to his mild  CKD, he is advised to lower his Metformin to 500 mg ER p.o. daily at breakfast.  -He has documented history of mild skin hives from sulfa medications.  He has tolerated low-dose glipizide 2.5 mg daily.  I advised him to increase glipizide to 5 mg XL p.o.  daily at breakfast.   -He expresses concern on cost of insulin, will be considered for either Novolin 70/30 to use twice a day . -He reports that he was given a sample of Ozempic from his primary care doctor's office in the interim which made some difference in his blood glucose profile.  He is working with her Medical illustrator for access to [employer], otherwise he will not afford the co-pays of GLP-1 receptor agonists.    - Patient specific target  for A1c; LDL, HDL, Triglycerides were discussed in detail.  2) BP/HTN:  -His blood pressure is controlled to target.    He is advised to continue his current blood pressure medications including amlodipine 5 mg p.o. daily, Hyzaar 100-25 mg p.o. daily.   3) Lipids/HPL: His recent lipid panel showed controlled LDL at  65, uncontrolled triglyceride 246.  He is advised to continue Crestor 5 mg p.o. nightly.    Side effects and precautions discussed with him.     4)  Weight/Diet: His current BMI 33.3 -a candidate for some weight loss.  No success and weight loss, CDE consult in progress, exercise, and carbohydrates information provided.  5) vitamin D deficiency: He is advised to continue 5000 units of vitamin D3 p.o. daily.     6) Chronic Care/Health Maintenance:  -Patient is on ACEI/ARB and Statin medications and encouraged to continue to follow up with Ophthalmology, Podiatrist at least yearly or according to recommendations, and advised to  stay away from smoking. I have recommended yearly flu vaccine and pneumonia vaccination at least every 5 years; moderate intensity exercise for up to 150 minutes weekly; and  sleep for at least 7 hours a day.  - I advised patient to maintain close follow up with [name], MD for primary care needs.    I spent 41 minutes in the care of the patient today including review of labs from [place], Lipids, Thyroid Function, Hematology (current and previous including abstractions from other facilities); face-to-face  time discussing  his blood glucose readings/logs, discussing hypoglycemia and hyperglycemia episodes and symptoms, medications doses, his options of short and long term treatment based on the latest standards of care / guidelines;  discussion about incorporating lifestyle medicine;  and documenting the encounter.    Please refer to Patient Instructions for Blood Glucose Monitoring and Insulin/Medications Dosing Guide"  in media tab for additional information. Please  also refer to " Patient Self Inventory" in the Media  tab for reviewed elements of pertinent patient history.  Raghav L Stairs participated in the discussions, expressed understanding, and voiced agreement with the above plans.  All questions were answered to his satisfaction. he is encouraged to contact clinic should he have any questions or concerns prior to his return visit.  Follow up plan: -No follow-ups on file.  Glade Lloyd, MD Phone: [phone]  Fax: [phone]  -  This note was partially dictated with voice recognition software. Similar sounding words can be transcribed inadequately or may not  be corrected upon review.  11/15/2020, 8:51 AM

## 2020-11-16 ENCOUNTER — Ambulatory Visit (INDEPENDENT_AMBULATORY_CARE_PROVIDER_SITE_OTHER): Payer: Medicare HMO | Admitting: Pharmacist

## 2020-11-16 DIAGNOSIS — E119 Type 2 diabetes mellitus without complications: Secondary | ICD-10-CM

## 2020-11-16 NOTE — Progress Notes (Signed)
    11/16/2020 Name: Patrick Moss MRN: [id number] DOB: 07/07/1940   S:  80 yoM Presents for diabetes evaluation, education, and management.  Patient was referred and last seen by Primary Care Provider on 10/24/20.  Insurance coverage/medication affordability: [employer]are  Patient reports adherence with medications. Current diabetes medications include: lantus, metformin, glipizide Current hypertension medications include: valsartan, hctz, norvasc Goal 130/80 Current hyperlipidemia medications include: crestor   Patient denies hypoglycemic events.   Patient reported dietary habits: Eats 3  meals/day  Patient-reported exercise habits: n/a  Discussed meal planning options and Plate method for healthy eating Avoid sugary drinks and desserts Incorporate balanced protein, non starchy veggies, 1 serving of carbohydrate with each meal Increase water intake Increase physical activity as able  Patient reports nocturia (nighttime urination).  Patient denies neuropathy (nerve pain).  Patient reports visual changes.  Patient reports self foot exams.    O:  Lab Results  Component Value Date   HGBA1C 8.0 (H) 10/24/2020    There were no vitals filed for this visit.     Lipid Panel     Component Value Date/Time   CHOL 139 10/24/2020 0857   TRIG 209 (H) 10/24/2020 0857   HDL 37 (L) 10/24/2020 0857   CHOLHDL 3.8 10/24/2020 0857   LDLCALC 67 10/24/2020 0857     Home fasting blood sugars: <150  2 hour post-meal/random blood sugars: n/a.    Clinical Atherosclerotic Cardiovascular Disease (ASCVD): No   The 10-year ASCVD risk score Mikey Bussing DC Jr., et al., 2013) is: 59.3%   Values used to calculate the score:     Age: 80 years     Sex: Male     Is Non-Hispanic African American: No     Diabetic: Yes     Tobacco smoker: No     Systolic Blood Pressure: 277 mmHg     Is BP treated: Yes     HDL Cholesterol: 37 mg/dL     Total Cholesterol: 139 mg/dL    A/P:  Diabetes  T2DM currently uncontrolled. Patient is adherent with medication. Control is suboptimal due to diet/lifestyle . Will start GLP1 trulicity 0.75mg  sq weekly easier to get via patient assistance Paper work filled out for [employer] sent to Dr. Dorris Fetch re: therapy Denies personal and family history of Medullary thyroid cancer (MTC) Decrease lantus by 10 units per Dr. Dorris Fetch  -Continue glipizide  -Continue metformin  -Consider CGM (libre2)  -Extensively discussed pathophysiology of diabetes, recommended lifestyle interventions, dietary effects on blood sugar control  -Counseled on s/sx of and management of hypoglycemia  Written patient instructions provided.  Total time in face to face counseling 25 minutes.    Regina Eck, PharmD, BCPS Clinical Pharmacist, [facility]  [facility] Phone [phone]

## 2020-11-20 ENCOUNTER — Telehealth: Payer: Self-pay | Admitting: Pharmacist

## 2020-11-20 NOTE — Telephone Encounter (Signed)
Patient new start on trulicity Message sent to endocrinologist Dr Dorris Fetch to review therapy He states the following: He may lower his lantus to 70 units qhs if he is going to get a regular supply of Trulicity. Thank you.   Call placed to patient with no answer Will try agin TELEPHONE APPT FOR 6/15 AT 8:30A

## 2020-11-28 ENCOUNTER — Other Ambulatory Visit: Payer: Self-pay

## 2020-11-28 ENCOUNTER — Ambulatory Visit (INDEPENDENT_AMBULATORY_CARE_PROVIDER_SITE_OTHER): Payer: Medicare HMO | Admitting: Pharmacist

## 2020-11-28 DIAGNOSIS — E119 Type 2 diabetes mellitus without complications: Secondary | ICD-10-CM | POA: Diagnosis not present

## 2020-11-28 NOTE — Progress Notes (Addendum)
Chronic Care Management Pharmacy Note  11/28/2020 Name:  Patrick Moss MRN:  [id number] DOB:  [birth year]  Summary/Recommendations/Changes made from today's visit: Diabetes: Uncontrolled/controlled; current treatment:LANTUS; GLIPIZIDE, METFORMIN; NEW START TRULICITY 4.07WK SQ WEEKLY Denies personal and family history of Medullary thyroid cancer (MTC) Application submitted for [employer] PATIENT ASSISTANCE PROGRAM--MEDICATION TO SHIP TO PCP OFFICE DUE TO PHONE NOT ALWAYS WORKING Plan: f/u in 1 week with pharmD to decrease insulin/adjust medications    Subjective: Patrick Moss is an 80 y.o. year old male who is a primary patient of [name], [name], MD.  The CCM team was consulted for assistance with disease management and care coordination needs.    Engaged with patient by telephone for initial visit in response to provider referral for pharmacy case management and/or care coordination services.   Consent to Services:  The patient was given the following information about Chronic Care Management services today, agreed to services, and gave verbal consent: 1. CCM service includes personalized support from designated clinical staff supervised by the primary care provider, including individualized plan of care and coordination with other care providers 2. 24/7 contact phone numbers for assistance for urgent and routine care needs. 3. Service will only be billed when office clinical staff spend 20 minutes or more in a month to coordinate care. 4. Only one practitioner may furnish and bill the service in a calendar month. 5.The patient may stop CCM services at any time (effective at the end of the month) by phone call to the office staff. 6. The patient will be responsible for cost sharing (co-pay) of up to 20% of the service fee (after annual deductible is met). Patient agreed to services and consent obtained.  Patient Care Team: [name], MD as PCP - General (Family Medicine) Cassandria Anger, MD as Consulting Physician (Endocrinology) Katy Fitch, Darlina Guys, MD as Consulting Physician (Ophthalmology) Lavera Guise, [facility] as Pharmacist (Family Medicine)  Recent office visits: 11/07/20 PCP  Recent consult visits: 11/15/20 endocrine  Hospital visits: None in previous 6 months  Objective:  Lab Results  Component Value Date   CREATININE 1.36 (H) 10/24/2020   CREATININE 1.48 (H) 07/26/2020   CREATININE 1.42 (H) 03/29/2020    Lab Results  Component Value Date   HGBA1C 8.0 (H) 10/24/2020   Last diabetic Eye exam:  Lab Results  Component Value Date/Time   HMDIABEYEEXA No Retinopathy 09/11/2020 12:00 AM    Last diabetic Foot exam: No results found for: HMDIABFOOTEX      Component Value Date/Time   CHOL 139 10/24/2020 0857   TRIG 209 (H) 10/24/2020 0857   HDL 37 (L) 10/24/2020 0857   CHOLHDL 3.8 10/24/2020 0857   LDLCALC 67 10/24/2020 0857    Hepatic Function Latest Ref Rng & Units 10/24/2020 07/26/2020 03/29/2020  Total Protein 6.0 - 8.5 g/dL 7.0 6.9 6.9  Albumin 3.7 - 4.7 g/dL 4.6 4.7 4.6  AST 0 - 40 IU/L 19 18 17   ALT 0 - 44 IU/L 24 25 29   Alk Phosphatase 44 - 121 IU/L 76 93 92  Total Bilirubin 0.0 - 1.2 mg/dL 0.5 0.4 0.4    Lab Results  Component Value Date/Time   TSH 3.610 12/02/2019 09:10 AM   TSH 3.450 08/29/2019 09:45 AM   FREET4 1.41 12/02/2019 09:10 AM   FREET4 1.27 08/29/2019 09:45 AM    CBC Latest Ref Rng & Units 10/24/2020 07/26/2020 03/29/2020  WBC 3.4 - 10.8 x10E3/uL 6.9 6.4 6.9  Hemoglobin 13.0 - 17.7  g/dL 15.2 15.1 14.8  Hematocrit 37.5 - 51.0 % 44.2 44.9 42.5  Platelets 150 - 450 x10E3/uL 164 203 212    Lab Results  Component Value Date/Time   VD25OH 40.4 07/26/2020 09:01 AM   VD25OH 19.4 (L) 03/29/2020 09:48 AM    Clinical ASCVD: No  The 10-year ASCVD risk score Mikey Bussing DC Jr., et al., 2013) is: 59.3%   Values used to calculate the score:     Age: 80 years     Sex: Male     Is Non-Hispanic African American: No      Diabetic: Yes     Tobacco smoker: No     Systolic Blood Pressure: 791 mmHg     Is BP treated: Yes     HDL Cholesterol: 37 mg/dL     Total Cholesterol: 139 mg/dL    Other: (CHADS2VASc if Afib, PHQ9 if depression, MMRC or CAT for COPD, ACT, DEXA)  Social History   Tobacco Use  Smoking Status Never  Smokeless Tobacco Never   BP Readings from Last 3 Encounters:  11/15/20 138/62  10/24/20 (!) 159/69  09/10/20 118/66   Pulse Readings from Last 3 Encounters:  11/15/20 68  10/24/20 67  09/10/20 68   Wt Readings from Last 3 Encounters:  11/15/20 206 lb 6.4 oz (93.6 kg)  10/24/20 205 lb 9.6 oz (93.3 kg)  10/02/20 203 lb (92.1 kg)    Assessment: Review of patient past medical history, allergies, medications, health status, including review of consultants reports, laboratory and other test data, was performed as part of comprehensive evaluation and provision of chronic care management services.   SDOH:  (Social Determinants of Health) assessments and interventions performed:    CCM Care Plan  Allergies  Allergen Reactions   Sulfamethoxazole-Trimethoprim Hives    Medications Reviewed Today     Reviewed by Lavera Guise, [facility] (Pharmacist) on 12/05/20 at 80  Med List Status: <None>   Medication Order Taking? Sig Documenting Provider Last Dose Status Informant  ACCU-CHEK SMARTVIEW test strip [id number] No TEST BLOOD SUGAR TWO TO THREE TIMES DAILY AS DIRECTED Nida, Marella Chimes, MD Taking Active   amLODipine (NORVASC) 5 MG tablet [id number]  TAKE 1 TABLET (5 MG TOTAL) BY MOUTH DAILY. [name], MD  Active   aspirin 81 MG tablet [id number] No Take 81 mg by mouth daily. [provider] Taking Active   B-D ULTRAFINE III SHORT PEN 31G X 8 MM MISC [id number] No USE  1  PEN  NEEDLE AS DIRECTED Nida, Marella Chimes, MD Taking Active   celecoxib (CELEBREX) 200 MG capsule [id number] No TAKE 1 CAPSULE (200 MG TOTAL) BY MOUTH DAILY WITH FOOD [name], MD Taking Active    Cholecalciferol (VITAMIN D3) 50 MCG (2000 UT) TABS [id number] No Take by mouth. [provider] Taking Active   Dulaglutide (TRULICITY) 8.30 NM/0.7WK SOPN [id number]  Inject into the skin. [provider]  Active            Med Note Blanca Friend, Royce Macadamia   Tue Dec 04, 2020  1:38 PM) Gets via [employer] patient assistance  glipiZIDE (GLUCOTROL XL) 5 MG 24 hr tablet [id number]  Take 1 tablet (5 mg total) by mouth daily with breakfast. Cassandria Anger, MD  Active   LANTUS SOLOSTAR 100 UNIT/ML Solostar Pen [id number] No INJECT 80 UNITS INTO THE SKIN AT BEDTIME (DISCARD USED PEN AFTER 28 DAYS) Nida, Marella Chimes, MD Taking Active  Med Note Blanca Friend, Srihaan Mastrangelo D   Wed Nov 28, 2020  8:36 AM) 70 units daily  metFORMIN (GLUCOPHAGE-XR) 500 MG 24 hr tablet [id number] No Take 1,000 mg by mouth daily after breakfast. [provider] Taking Active            Med Note Georgiann Cocker, AMY E   Tue Oct 02, 2020  8:39 AM) Taking one per day  rosuvastatin (CRESTOR) 5 MG tablet [id number] No Take 1 tablet (5 mg total) by mouth daily. [name], MD Taking Active   valsartan-hydrochlorothiazide (DIOVAN-HCT) 320-25 MG tablet [id number] No Take 1 tablet by mouth daily. For Blood Pressure [name], MD Taking Active             Patient Active Problem List   Diagnosis Date Noted   Uncontrolled type 2 diabetes mellitus with hyperglycemia ([place]) 04/07/2019   Intractable hiccups 09/10/2018   Mixed hyperlipidemia 01/19/2017   Class 1 obesity due to excess calories without serious comorbidity with body mass index (BMI) of 32.0 to 32.9 in adult 10/14/2016   Hypertension associated with diabetes ([place]) 03/12/2016   Uncontrolled type 2 diabetes mellitus without complication, without long-term current use of insulin 05/16/2015   Essential hypertension, benign 05/16/2015   Tinea 11/02/2013   Type 2 diabetes mellitus with neurological complications ([place]) 00/86/7619    Immunization  History  Administered Date(s) Administered   Fluad Quad(high Dose 65+) 03/29/2020   Influenza, High Dose Seasonal PF 03/28/2015, 04/07/2016, 04/30/2016, 03/20/2017, 03/17/2018, 03/07/2019   Influenza,inj,Quad PF,6+ Mos 04/07/2016, 04/30/2016, 03/20/2017, 03/17/2018   Moderna Sars-Covid-2 Vaccination 07/06/2019, 08/03/2019, 05/02/2020   Pneumococcal Conjugate-13 06/30/2016   Pneumococcal Polysaccharide-23 07/15/2017   Tdap 12/27/2014    Conditions to be addressed/monitored: HTN, HLD, and DMII  Care Plan : PHARMD MEDICATION MANAGEMENT  Updates made by Lavera Guise, [place] since 12/05/2020 12:00 AM     Problem: DISEASE PROGRESSION PREVENTION   Priority: High     Long-Range Goal: T2DM   This Visit's Progress: On track  Recent Progress: On track  Priority: High  Note:   Current Barriers:  Unable to independently afford treatment regimen Unable to achieve control of T2DM  Suboptimal therapeutic regimen for T2DM  Pharmacist Clinical Goal(s):  Over the next 90 days, patient will verbalize ability to afford treatment regimen achieve control of T2DM as evidenced by GOAL A1C<7%  through collaboration with PharmD and provider.    Interventions: 1:1 collaboration with [name], MD regarding development and update of comprehensive plan of care as evidenced by provider attestation and co-signature Inter-disciplinary care team collaboration (see longitudinal plan of care) Comprehensive medication review performed; medication list updated in electronic medical record  Diabetes: Uncontrolled/controlled; current treatment:LANTUS; GLIPIZIDE, METFORMIN; NEW START TRULICITY 5.09TO SQ WEEKLY Denies personal and family history of Medullary thyroid cancer (MTC) Application submitted/ENROLLED IN [employer] PATIENT ASSISTANCE PROGRAM--MEDICATION TO SHIP TO PCP OFFICE DUE TO PHONE NOT ALWAYS WORKING Current glucose readings: fasting glucose: <135, post prandial glucose: N/A Denies  hypoglycemic/hyperglycemic symptoms Discussed meal planning options and Plate method for healthy eating Avoid sugary drinks and desserts Incorporate balanced protein, non starchy veggies, 1 serving of carbohydrate with each meal Increase water intake Increase physical activity as able Current exercise: N/A Educated on TRULICITY--MEDICATION PURPOSE & SIDE EFFECTS Recommended TRULICITY, DECREASE INSULIN TO 70 UNITS DAILY (DISCUSSED TREATMENT PLAN WITH DR. NIDA (ENDOCRINE); WORK TO DISCONTINUE GLIPIZIDE AS WE TITRATE GLP1 (AS NEEDED AND/OR TOLERATED) Assessed patient finances. FINANCIAL ASSISTANCE NEEDED-SEE ABOVE   Patient Goals/Self-Care Activities Over the next 90  days, patient will:  - take medications as prescribed check glucose DAILY (FASTING), document, and provide at future appointments  Follow Up Plan: Telephone follow up appointment with care management team member scheduled for: 1 MONTH      Medication Assistance: Application for trulicity/[employer]  medication assistance program. in process.  Anticipated assistance start date tbd.  See plan of care for additional detail.  Patient's preferred pharmacy is:  [facility] [address], [place] - [place] [address] [place] [address] Phone: [phone] Fax: [phone]  [facility] - [place], [place] [address] Phone: [phone] Fax: [phone]  Uses pill box? No -   Pt endorses 100% compliance  Follow Up:  Patient agrees to Care Plan and Follow-up.  Plan: Telephone follow up appointment with care management team member scheduled for:  12/05/20  Regina Eck, PharmD, BCPS Clinical Pharmacist, [place]  II Phone [phone]

## 2020-11-29 ENCOUNTER — Other Ambulatory Visit: Payer: Self-pay | Admitting: Family Medicine

## 2020-11-29 DIAGNOSIS — E1159 Type 2 diabetes mellitus with other circulatory complications: Secondary | ICD-10-CM

## 2020-12-04 ENCOUNTER — Telehealth: Payer: Self-pay | Admitting: *Deleted

## 2020-12-04 NOTE — Telephone Encounter (Signed)
Please call patient to let him know [employer] patient assistance shipment is here Trulicity 0.75mg  sq weekly

## 2020-12-04 NOTE — Telephone Encounter (Signed)
Patient aware, he will come to pick up tomorrow.

## 2020-12-04 NOTE — Telephone Encounter (Signed)
   This pt received a box of Trulicity from [place] today - there are #4 boxes in the fridge with his name on it.    Please let clinical know if pt needs and we can call and let pt know to pick up.

## 2020-12-05 ENCOUNTER — Ambulatory Visit: Payer: Medicare HMO | Admitting: Pharmacist

## 2020-12-05 DIAGNOSIS — E119 Type 2 diabetes mellitus without complications: Secondary | ICD-10-CM

## 2020-12-05 NOTE — Patient Instructions (Signed)
Visit Information  PATIENT GOALS:  Goals Addressed               This Visit's Progress     Patient Stated     T2DM-PHARMD (pt-stated)        Current Barriers:  Unable to independently afford treatment regimen Unable to achieve control of T2DM  Suboptimal therapeutic regimen for T2DM   Pharmacist Clinical Goal(s):  Over the next 90 days, patient will verbalize ability to afford treatment regimen achieve control of T2DM as evidenced by GOAL A1C<7% through collaboration with PharmD and provider.    Interventions: 1:1 collaboration with [name], MD regarding development and update of comprehensive plan of care as evidenced by provider attestation and co-signature Inter-disciplinary care team collaboration (see longitudinal plan of care) Comprehensive medication review performed; medication list updated in electronic medical record  Diabetes: Uncontrolled/controlled; current treatment:LANTUS; GLIPIZIDE, METFORMIN; NEW START TRULICITY 0.75MG  SQ WEEKLY Denies personal and family history of Medullary thyroid cancer (MTC) ENROLLED IN [employer] PATIENT ASSISTANCE PROGRAM--MEDICATION TO SHIP TO PCP OFFICE DUE TO PHONE NOT ALWAYS WORKING Current glucose readings: fasting glucose: <135, post prandial glucose: N/A Denies hypoglycemic/hyperglycemic symptoms Discussed meal planning options and Plate method for healthy eating Avoid sugary drinks and desserts Incorporate balanced protein, non starchy veggies, 1 serving of carbohydrate with each meal Increase water intake Increase physical activity as able Current exercise: N/A Educated on TRULICITY--MEDICATION PURPOSE & SIDE EFFECTS Recommended TRULICITY, DECREASE INSULIN TO 70 UNITS DAILY (DISCUSSED TREATMENT PLAN WITH DR. NIDA (ENDOCRINE); WORK TO DISCONTINUE GLIPIZIDE AS WE TITRATE GLP1 (AS NEEDED AND/OR TOLERATED) Assessed patient finances. FINANCIAL ASSISTANCE NEEDED-SEE ABOVE   Patient Goals/Self-Care Activities Over the  next 90 days, patient will:  - take medications as prescribed check glucose DAILY (FASTING), document, and provide at future appointments  Follow Up Plan: Telephone follow up appointment with care management team member scheduled for: 1 MONTH          The patient verbalized understanding of instructions, educational materials, and care plan provided today and declined offer to receive copy of patient instructions, educational materials, and care plan.   Telephone follow up appointment with care management team member scheduled for:1 month  Regina Eck, PharmD, BCPS Clinical Pharmacist, [place]  II Phone [phone]

## 2020-12-05 NOTE — Progress Notes (Signed)
Chronic Care Management Pharmacy Note  12/05/2020 Name:  Patrick Moss MRN:  [id number] DOB:  [birth year]  Summary/Recommendations/Changes made from today's visit:  Diabetes: Uncontrolled/controlled; current treatment:LANTUS; GLIPIZIDE, METFORMIN; NEW START TRULICITY 4.17EY SQ WEEKLY Denies personal and family history of Medullary thyroid cancer (MTC) ENROLLED IN [facility] PATIENT ASSISTANCE PROGRAM--MEDICATION TO SHIP TO PCP OFFICE DUE TO PHONE NOT ALWAYS WORKING Current glucose readings: fasting glucose: <135, post prandial glucose: N/A Denies hypoglycemic/hyperglycemic symptoms Discussed meal planning options and Plate method for healthy eating Avoid sugary drinks and desserts Incorporate balanced protein, non starchy veggies, 1 serving of carbohydrate with each meal Increase water intake Increase physical activity as able Current exercise: N/A Educated on TRULICITY--MEDICATION PURPOSE & SIDE EFFECTS Recommended TRULICITY, DECREASE INSULIN TO 70 UNITS DAILY (DISCUSSED TREATMENT PLAN WITH DR. NIDA (ENDOCRINE); WORK TO DISCONTINUE GLIPIZIDE AS WE TITRATE GLP1 (AS NEEDED AND/OR TOLERATED) Assessed patient finances. FINANCIAL ASSISTANCE NEEDED-SEE ABOVE  Plan: f/u in 1 month w/ pharmD  Subjective: Patrick Moss is an 80 y.o. year old male who is a primary patient of [name], [name], MD.  The CCM team was consulted for assistance with disease management and care coordination needs.    Engaged with patient by telephone for follow up visit in response to provider referral for pharmacy case management and/or care coordination services.   Consent to Services:  The patient was given information about Chronic Care Management services, agreed to services, and gave verbal consent prior to initiation of services.  Please see initial visit note for detailed documentation.   Patient Care Team: [name], MD as PCP - General (Family Medicine) Cassandria Anger, MD as Consulting  Physician (Endocrinology) Debbra Riding, MD as Consulting Physician (Ophthalmology) Lavera Guise, [facility] as Pharmacist (Family Medicine)  Recent office visits: 11/07/20  Recent consult visits: 11/15/20 [facility] visits: None in previous 6 months  Objective:  Lab Results  Component Value Date   CREATININE 1.36 (H) 10/24/2020   CREATININE 1.48 (H) 07/26/2020   CREATININE 1.42 (H) 03/29/2020    Lab Results  Component Value Date   HGBA1C 8.0 (H) 10/24/2020   Last diabetic Eye exam:  Lab Results  Component Value Date/Time   HMDIABEYEEXA No Retinopathy 09/11/2020 12:00 AM    Last diabetic Foot exam: No results found for: HMDIABFOOTEX      Component Value Date/Time   CHOL 139 10/24/2020 0857   TRIG 209 (H) 10/24/2020 0857   HDL 37 (L) 10/24/2020 0857   CHOLHDL 3.8 10/24/2020 0857   LDLCALC 67 10/24/2020 0857    Hepatic Function Latest Ref Rng & Units 10/24/2020 07/26/2020 03/29/2020  Total Protein 6.0 - 8.5 g/dL 7.0 6.9 6.9  Albumin 3.7 - 4.7 g/dL 4.6 4.7 4.6  AST 0 - 40 IU/L 19 18 17   ALT 0 - 44 IU/L 24 25 29   Alk Phosphatase 44 - 121 IU/L 76 93 92  Total Bilirubin 0.0 - 1.2 mg/dL 0.5 0.4 0.4    Lab Results  Component Value Date/Time   TSH 3.610 12/02/2019 09:10 AM   TSH 3.450 08/29/2019 09:45 AM   FREET4 1.41 12/02/2019 09:10 AM   FREET4 1.27 08/29/2019 09:45 AM    CBC Latest Ref Rng & Units 10/24/2020 07/26/2020 03/29/2020  WBC 3.4 - 10.8 x10E3/uL 6.9 6.4 6.9  Hemoglobin 13.0 - 17.7 g/dL 15.2 15.1 14.8  Hematocrit 37.5 - 51.0 % 44.2 44.9 42.5  Platelets 150 - 450 x10E3/uL 164 203 212    Lab Results  Component Value Date/Time   VD25OH 40.4 07/26/2020 09:01 AM   VD25OH 19.4 (L) 03/29/2020 09:48 AM    Clinical ASCVD: No  The 10-year ASCVD risk score Mikey Bussing DC Jr., et al., 2013) is: 59.3%   Values used to calculate the score:     Age: 32 years     Sex: Male     Is Non-Hispanic African American: No     Diabetic: Yes     Tobacco smoker:  No     Systolic Blood Pressure: 681 mmHg     Is BP treated: Yes     HDL Cholesterol: 37 mg/dL     Total Cholesterol: 139 mg/dL    Other: (CHADS2VASc if Afib, PHQ9 if depression, MMRC or CAT for COPD, ACT, DEXA)  Social History   Tobacco Use  Smoking Status Never  Smokeless Tobacco Never   BP Readings from Last 3 Encounters:  11/15/20 138/62  10/24/20 (!) 159/69  09/10/20 118/66   Pulse Readings from Last 3 Encounters:  11/15/20 68  10/24/20 67  09/10/20 68   Wt Readings from Last 3 Encounters:  11/15/20 206 lb 6.4 oz (93.6 kg)  10/24/20 205 lb 9.6 oz (93.3 kg)  10/02/20 203 lb (92.1 kg)    Assessment: Review of patient past medical history, allergies, medications, health status, including review of consultants reports, laboratory and other test data, was performed as part of comprehensive evaluation and provision of chronic care management services.   SDOH:  (Social Determinants of Health) assessments and interventions performed:    CCM Care Plan  Allergies  Allergen Reactions   Sulfamethoxazole-Trimethoprim Hives    Medications Reviewed Today     Reviewed by Lavera Guise, [facility] (Pharmacist) on 12/05/20 at 85  Med List Status: <None>   Medication Order Taking? Sig Documenting Provider Last Dose Status Informant  ACCU-CHEK SMARTVIEW test strip [id number] No TEST BLOOD SUGAR TWO TO THREE TIMES DAILY AS DIRECTED Nida, Marella Chimes, MD Taking Active   amLODipine (NORVASC) 5 MG tablet [id number]  TAKE 1 TABLET (5 MG TOTAL) BY MOUTH DAILY. [name], MD  Active   aspirin 81 MG tablet [id number] No Take 81 mg by mouth daily. [provider] Taking Active   B-D ULTRAFINE III SHORT PEN 31G X 8 MM MISC [id number] No USE  1  PEN  NEEDLE AS DIRECTED Nida, Marella Chimes, MD Taking Active   celecoxib (CELEBREX) 200 MG capsule [id number] No TAKE 1 CAPSULE (200 MG TOTAL) BY MOUTH DAILY WITH FOOD [name], MD Taking Active   Cholecalciferol (VITAMIN D3) 50  MCG (2000 UT) TABS [id number] No Take by mouth. [provider] Taking Active   Dulaglutide (TRULICITY) 2.80 KL/4.9ZP SOPN [id number]  Inject into the skin. [provider]  Active            Med Note Blanca Friend, Royce Macadamia   Tue Dec 04, 2020  1:38 PM) Gets via [facility] patient assistance  glipiZIDE (GLUCOTROL XL) 5 MG 24 hr tablet [id number]  Take 1 tablet (5 mg total) by mouth daily with breakfast. Cassandria Anger, MD  Active   LANTUS SOLOSTAR 100 UNIT/ML Solostar Pen [id number] No INJECT 80 UNITS INTO THE SKIN AT BEDTIME (DISCARD USED PEN AFTER 28 DAYS) Nida, Marella Chimes, MD Taking Active            Med Note Lavera Guise   Wed Nov 28, 2020  8:36 AM) 70 units daily  metFORMIN (GLUCOPHAGE-XR) 500 MG 24 hr tablet [id number] No  Take 1,000 mg by mouth daily after breakfast. [provider] Taking Active            Med Note Georgiann Cocker, AMY E   Tue Oct 02, 2020  8:39 AM) Taking one per day  rosuvastatin (CRESTOR) 5 MG tablet [id number] No Take 1 tablet (5 mg total) by mouth daily. [name], MD Taking Active   valsartan-hydrochlorothiazide (DIOVAN-HCT) 320-25 MG tablet [id number] No Take 1 tablet by mouth daily. For Blood Pressure [name], MD Taking Active             Patient Active Problem List   Diagnosis Date Noted   Uncontrolled type 2 diabetes mellitus with hyperglycemia ([place]) 04/07/2019   Intractable hiccups 09/10/2018   Mixed hyperlipidemia 01/19/2017   Class 1 obesity due to excess calories without serious comorbidity with body mass index (BMI) of 32.0 to 32.9 in adult 10/14/2016   Hypertension associated with diabetes ([place]) 03/12/2016   Uncontrolled type 2 diabetes mellitus without complication, without long-term current use of insulin 05/16/2015   Essential hypertension, benign 05/16/2015   Tinea 11/02/2013   Type 2 diabetes mellitus with neurological complications ([place]) 44/97/5300    Immunization History  Administered Date(s)  Administered   Fluad Quad(high Dose 65+) 03/29/2020   Influenza, High Dose Seasonal PF 03/28/2015, 04/07/2016, 04/30/2016, 03/20/2017, 03/17/2018, 03/07/2019   Influenza,inj,Quad PF,6+ Mos 04/07/2016, 04/30/2016, 03/20/2017, 03/17/2018   Moderna Sars-Covid-2 Vaccination 07/06/2019, 08/03/2019, 05/02/2020   Pneumococcal Conjugate-13 06/30/2016   Pneumococcal Polysaccharide-23 07/15/2017   Tdap 12/27/2014    Conditions to be addressed/monitored: HTN, HLD, and DMII  Care Plan : PHARMD MEDICATION MANAGEMENT  Updates made by Lavera Guise, [place] since 12/05/2020 12:00 AM     Problem: DISEASE PROGRESSION PREVENTION   Priority: High     Long-Range Goal: T2DM   This Visit's Progress: On track  Priority: High  Note:   Current Barriers:  Unable to independently afford treatment regimen Unable to achieve control of T2DM  Suboptimal therapeutic regimen for T2DM   Pharmacist Clinical Goal(s):  Over the next 90 days, patient will verbalize ability to afford treatment regimen achieve control of T2DM as evidenced by GOAL A1C<7%  through collaboration with PharmD and provider.    Interventions: 1:1 collaboration with [name], MD regarding development and update of comprehensive plan of care as evidenced by provider attestation and co-signature Inter-disciplinary care team collaboration (see longitudinal plan of care) Comprehensive medication review performed; medication list updated in electronic medical record  Diabetes: Uncontrolled/controlled; current treatment:LANTUS; GLIPIZIDE, METFORMIN; NEW START TRULICITY 5.11MY SQ WEEKLY Denies personal and family history of Medullary thyroid cancer (MTC) ENROLLED IN [facility] PATIENT ASSISTANCE PROGRAM--MEDICATION TO SHIP TO PCP OFFICE DUE TO PHONE NOT ALWAYS WORKING Current glucose readings: fasting glucose: <135, post prandial glucose: N/A Denies hypoglycemic/hyperglycemic symptoms Discussed meal planning options and Plate method for  healthy eating Avoid sugary drinks and desserts Incorporate balanced protein, non starchy veggies, 1 serving of carbohydrate with each meal Increase water intake Increase physical activity as able Current exercise: N/A Educated on TRULICITY--MEDICATION PURPOSE & SIDE EFFECTS Recommended TRULICITY, DECREASE INSULIN TO 70 UNITS DAILY (DISCUSSED TREATMENT PLAN WITH DR. NIDA (ENDOCRINE); WORK TO DISCONTINUE GLIPIZIDE AS WE TITRATE GLP1 (AS NEEDED AND/OR TOLERATED) Assessed patient finances. FINANCIAL ASSISTANCE NEEDED-SEE ABOVE   Patient Goals/Self-Care Activities Over the next 90 days, patient will:  - take medications as prescribed check glucose DAILY (FASTING), document, and provide at future appointments  Follow Up Plan: Telephone follow up appointment with care management team member  scheduled for: 1 MONTH      Medication Assistance:  TRULICITY obtained through [place] medication assistance program.  Enrollment ends 06/15/21  Patient's preferred pharmacy is:  [facility] [address], [place] - [address] [place] [address] Phone: [phone] Fax: [phone]  [facility] ([facility] - [place], [place] [address] Phone: [phone] Fax: [phone]  Uses pill box? No -   Pt endorses 100% compliance  Follow Up:  Patient agrees to Care Plan and Follow-up.  Plan: Telephone follow up appointment with care management team member scheduled for:  1 month  Regina Eck, PharmD, BCPS Clinical Pharmacist, [place]  II Phone [phone]

## 2020-12-05 NOTE — Patient Instructions (Signed)
Visit Information  PATIENT GOALS:  Goals Addressed               This Visit's Progress     Patient Stated     T2DM-PHARMD (pt-stated)        Current Barriers:  Unable to independently afford treatment regimen Unable to achieve control of T2DM  Suboptimal therapeutic regimen for T2DM  Pharmacist Clinical Goal(s):  Over the next 90 days, patient will verbalize ability to afford treatment regimen achieve control of T2DM as evidenced by GOAL A1C<7% through collaboration with PharmD and provider.    Interventions: 1:1 collaboration with [name], MD regarding development and update of comprehensive plan of care as evidenced by provider attestation and co-signature Inter-disciplinary care team collaboration (see longitudinal plan of care) Comprehensive medication review performed; medication list updated in electronic medical record  Diabetes: Uncontrolled/controlled; current treatment:LANTUS; GLIPIZIDE, METFORMIN; NEW START TRULICITY 0.75MG  SQ WEEKLY Denies personal and family history of Medullary thyroid cancer (MTC) Application submitted/ENROLLED IN [facility] [employer] PATIENT ASSISTANCE PROGRAM--MEDICATION TO SHIP TO PCP OFFICE DUE TO PHONE NOT ALWAYS WORKING Current glucose readings: fasting glucose: <135, post prandial glucose: N/A Denies hypoglycemic/hyperglycemic symptoms Discussed meal planning options and Plate method for healthy eating Avoid sugary drinks and desserts Incorporate balanced protein, non starchy veggies, 1 serving of carbohydrate with each meal Increase water intake Increase physical activity as able Current exercise: N/A Educated on TRULICITY--MEDICATION PURPOSE & SIDE EFFECTS Recommended TRULICITY, DECREASE INSULIN TO 70 UNITS DAILY (DISCUSSED TREATMENT PLAN WITH DR. NIDA (ENDOCRINE); WORK TO DISCONTINUE GLIPIZIDE AS WE TITRATE GLP1 (AS NEEDED AND/OR TOLERATED) Assessed patient finances. FINANCIAL ASSISTANCE NEEDED-SEE ABOVE   Patient Goals/Self-Care  Activities Over the next 90 days, patient will:  - take medications as prescribed check glucose DAILY (FASTING), document, and provide at future appointments  Follow Up Plan: Telephone follow up appointment with care management team member scheduled for: 1 MONTH          The patient verbalized understanding of instructions, educational materials, and care plan provided today and declined offer to receive copy of patient instructions, educational materials, and care plan.   Telephone follow up appointment with care management team member scheduled for: 12/05/20  Regina Eck, PharmD, BCPS Clinical Pharmacist, [place]  II Phone [phone]

## 2020-12-07 ENCOUNTER — Other Ambulatory Visit: Payer: Self-pay | Admitting: *Deleted

## 2020-12-07 DIAGNOSIS — E782 Mixed hyperlipidemia: Secondary | ICD-10-CM

## 2020-12-07 NOTE — Telephone Encounter (Signed)
error 

## 2020-12-27 ENCOUNTER — Ambulatory Visit (INDEPENDENT_AMBULATORY_CARE_PROVIDER_SITE_OTHER): Payer: Medicare HMO | Admitting: Pharmacist

## 2020-12-27 DIAGNOSIS — E1149 Type 2 diabetes mellitus with other diabetic neurological complication: Secondary | ICD-10-CM | POA: Diagnosis not present

## 2020-12-27 NOTE — Patient Instructions (Signed)
Visit Information  PATIENT GOALS:  Goals Addressed               This Visit's Progress     Patient Stated     T2DM-PHARMD (pt-stated)        Current Barriers:  Unable to independently afford treatment regimen Unable to achieve control of T2DM  Suboptimal therapeutic regimen for T2DM  Pharmacist Clinical Goal(s):  Over the next 90 days, patient will verbalize ability to afford treatment regimen achieve control of T2DM as evidenced by GOAL A1C<7% through collaboration with PharmD and provider.    Interventions: 1:1 collaboration with [name], MD regarding development and update of comprehensive plan of care as evidenced by provider attestation and co-signature Inter-disciplinary care team collaboration (see longitudinal plan of care) Comprehensive medication review performed; medication list updated in electronic medical record  Diabetes: Uncontrolled; current treatment: LANTUS 70 units; GLIPIZIDE, METFORMIN; TRULICITY 0.75MG  SQ WEEKLY Denies personal and family history of Medullary thyroid cancer ([place]) Patient enrolled IN [place] until 76/54/65 for TRULICITY 0.75mg --MEDICATION TO SHIP TO PCP OFFICE DUE TO PHONE NOT ALWAYS WORKING Consider increasing Trulicity to 1.5mg  weekly, awaiting endocrine's approval A1c 8.2% Current glucose readings: fasting glucose: <135, post prandial glucose: N/A Denies hypoglycemic/hyperglycemic symptoms Discussed meal planning options and Plate method for healthy eating Avoid sugary drinks and desserts Incorporate balanced protein, non starchy veggies, 1 serving of carbohydrate with each meal Increase water intake Increase physical activity as able Current exercise: N/A Educated on TRULICITY--MEDICATION PURPOSE & SIDE EFFECTS Recommended TRULICITY, DECREASE INSULIN TO 70 UNITS DAILY (DISCUSSED TREATMENT PLAN WITH DR. NIDA (ENDOCRINE); WORK TO DISCONTINUE GLIPIZIDE AS WE TITRATE GLP1 (AS NEEDED AND/OR  TOLERATED) Assessed patient finances. Patient enrolled in [employer] patient assistance program for Trulicity until [birth year]54[birth year]   Patient Goals/Self-Care Activities Over the next 90 days, patient will:  - take medications as prescribed check glucose DAILY (FASTING), document, and provide at future appointments  Follow Up Plan: Telephone follow up appointment with care management team member scheduled for: 1 MONTH         The patient verbalized understanding of instructions, educational materials, and care plan provided today and declined offer to receive copy of patient instructions, educational materials, and care plan.   Telephone follow up appointment with care management team member scheduled for: 2 months  Signature Regina Eck, PharmD, BCPS Clinical Pharmacist, [place]  II Phone [phone]

## 2020-12-27 NOTE — Progress Notes (Signed)
Chronic Care Management Pharmacy Note  12/27/2020 Name:  Patrick Moss MRN:  [id number] DOB:  03-19-1941  Summary: diabetes management  Recommendations/Changes made from today's visit: Diabetes: Uncontrolled; current treatment: LANTUS 70 units; GLIPIZIDE, METFORMIN; TRULICITY 0.10XN SQ WEEKLY Denies personal and family history of Medullary thyroid cancer ([place]) Patient enrolled IN [place] until 23/55/73 for TRULICITY 2.20UR--KYHCWCBJSE TO SHIP TO PCP OFFICE DUE TO PHONE NOT ALWAYS WORKING Consider increasing Trulicity to 8.3TD weekly, awaiting endocrine's approval A1c 8.2% Current glucose readings: fasting glucose: <135, post prandial glucose: N/A Denies hypoglycemic/hyperglycemic symptoms Current exercise: N/A Educated on TRULICITY--MEDICATION PURPOSE & SIDE EFFECTS Recommended TRULICITY, DECREASE INSULIN TO 70 UNITS DAILY (DISCUSSED TREATMENT PLAN WITH DR. NIDA (ENDOCRINE); WORK TO DISCONTINUE GLIPIZIDE AS WE TITRATE GLP1 (AS NEEDED AND/OR TOLERATED) Assessed patient finances. Patient enrolled in [facility] patient assistance program for Trulicity until 17/61/60  Plan: f/u in 2 months  Subjective: Patrick Moss is an 80 y.o. year old male who is a primary patient of [name], [name], MD.  The CCM team was consulted for assistance with disease management and care coordination needs.    Engaged with patient by telephone for follow up visit in response to provider referral for pharmacy case management and/or care coordination services.   Consent to Services:  The patient was given information about Chronic Care Management services, agreed to services, and gave verbal consent prior to initiation of services.  Please see initial visit note for detailed documentation.   Patient Care Team: [name], MD as PCP - General (Family Medicine) Cassandria Anger, MD as Consulting Physician (Endocrinology) Debbra Riding, MD as Consulting  Physician (Ophthalmology) Lavera Guise, [facility] as Pharmacist (Family Medicine)  Recent office visits: 11/16/20 PCP  Recent consult visits: Endocrine 11/15/20  Hospital visits: None in previous 6 months  Objective:  Lab Results  Component Value Date   CREATININE 1.36 (H) 10/24/2020   CREATININE 1.48 (H) 07/26/2020   CREATININE 1.42 (H) 03/29/2020    Lab Results  Component Value Date   HGBA1C 8.0 (H) 10/24/2020   Last diabetic Eye exam:  Lab Results  Component Value Date/Time   HMDIABEYEEXA No Retinopathy 09/11/2020 12:00 AM    Last diabetic Foot exam: No results found for: HMDIABFOOTEX      Component Value Date/Time   CHOL 139 10/24/2020 0857   TRIG 209 (H) 10/24/2020 0857   HDL 37 (L) 10/24/2020 0857   CHOLHDL 3.8 10/24/2020 0857   LDLCALC 67 10/24/2020 0857    Hepatic Function Latest Ref Rng & Units 10/24/2020 07/26/2020 03/29/2020  Total Protein 6.0 - 8.5 g/dL 7.0 6.9 6.9  Albumin 3.7 - 4.7 g/dL 4.6 4.7 4.6  AST 0 - 40 IU/L _0 ALT 0 - 44 IU/L _1 Alk Phosphatase 44 - 121 IU/L 76 93 92  Total Bilirubin 0.0 - 1.2 mg/dL 0.5 0.4 0.4    Lab Results  Component Value Date/Time   TSH 3.610 12/02/2019 09:10 AM   TSH 3.450 08/29/2019 09:45 AM   FREET4 1.41 12/02/2019 09:10 AM   FREET4 1.27 08/29/2019 09:45 AM    CBC Latest Ref Rng & Units 10/24/2020 07/26/2020 03/29/2020  WBC 3.4 - 10.8 x10E3/uL 6.9 6.4 6.9  Hemoglobin 13.0 - 17.7 g/dL 15.2 15.1 14.8  Hematocrit 37.5 - 51.0 % 44.2 44.9 42.5  Platelets 150 - 450 x10E3/uL 164 203 212    Lab Results  Component Value Date/Time   VD25OH 40.4 07/26/2020  09:01 AM   VD25OH 19.4 (L) 03/29/2020 09:48 AM    Clinical ASCVD: No  The 10-year ASCVD risk score Mikey Bussing DC Jr., et al., 2013) is: 59.3%   Values used to calculate the score:     Age: 32 years     Sex: Male     Is Non-Hispanic African American: No     Diabetic: Yes     Tobacco smoker: No     Systolic Blood Pressure: 194 mmHg     Is BP treated:  Yes     HDL Cholesterol: 37 mg/dL     Total Cholesterol: 139 mg/dL    Other: (CHADS2VASc if Afib, PHQ9 if depression, MMRC or CAT for COPD, ACT, DEXA)  Social History   Tobacco Use  Smoking Status Never  Smokeless Tobacco Never   BP Readings from Last 3 Encounters:  11/15/20 138/62  10/24/20 (!) 159/69  09/10/20 118/66   Pulse Readings from Last 3 Encounters:  11/15/20 68  10/24/20 67  09/10/20 68   Wt Readings from Last 3 Encounters:  11/15/20 206 lb 6.4 oz (93.6 kg)  10/24/20 205 lb 9.6 oz (93.3 kg)  10/02/20 203 lb (92.1 kg)    Assessment: Review of patient past medical history, allergies, medications, health status, including review of consultants reports, laboratory and other test data, was performed as part of comprehensive evaluation and provision of chronic care management services.   SDOH:  (Social Determinants of Health) assessments and interventions performed:    CCM Care Plan  Allergies  Allergen Reactions   Sulfamethoxazole-Trimethoprim Hives    Medications Reviewed Today     Reviewed by Lavera Guise, [facility] (Pharmacist) on 12/27/20 at 1425  Med List Status: <None>   Medication Order Taking? Sig Documenting Provider Last Dose Status Informant  ACCU-CHEK SMARTVIEW test strip [id number] No TEST BLOOD SUGAR TWO TO THREE TIMES DAILY AS DIRECTED Nida, Marella Chimes, MD Taking Active   amLODipine (NORVASC) 5 MG tablet [id number]  TAKE 1 TABLET (5 MG TOTAL) BY MOUTH DAILY. [name], MD  Active   aspirin 81 MG tablet [id number] No Take 81 mg by mouth daily. [provider] Taking Active   B-D ULTRAFINE III SHORT PEN 31G X 8 MM MISC [id number] No USE  1  PEN  NEEDLE AS DIRECTED Nida, Marella Chimes, MD Taking Active   celecoxib (CELEBREX) 200 MG capsule [id number] No TAKE 1 CAPSULE (200 MG TOTAL) BY MOUTH DAILY WITH FOOD [name], MD Taking Active   Cholecalciferol (VITAMIN D3) 50 MCG (2000 UT) TABS [id number] No Take by mouth. [provider] Taking Active   Dulaglutide (TRULICITY) 6.29 UT/6.5YY SOPN [id number]  Inject into the skin. [provider]  Active            Med Note Blanca Friend, Royce Macadamia   Tue Dec 04, 2020  1:38 PM) Gets via [facility] patient assistance  glipiZIDE (GLUCOTROL XL) 5 MG 24 hr tablet [id number]  Take 1 tablet (5 mg total) by mouth daily with breakfast. Cassandria Anger, MD  Active   LANTUS SOLOSTAR 100 UNIT/ML Solostar Pen [id number] No INJECT 80 UNITS INTO THE SKIN AT BEDTIME (DISCARD USED PEN AFTER 28 DAYS) Nida, Marella Chimes, MD Taking Active            Med Note Blanca Friend, Royce Macadamia   Wed Nov 28, 2020  8:36 AM) 70 units daily  metFORMIN (GLUCOPHAGE-XR) 500 MG 24 hr tablet [id number] No Take 1,000 mg by mouth daily after breakfast.  [provider] Taking Active            Med Note Georgiann Cocker, AMY E   Tue Oct 02, 2020  8:39 AM) Taking one per day  rosuvastatin (CRESTOR) 5 MG tablet [id number] No Take 1 tablet (5 mg total) by mouth daily. [name], MD Taking Active   valsartan-hydrochlorothiazide (DIOVAN-HCT) 320-25 MG tablet [id number] No Take 1 tablet by mouth daily. For Blood Pressure [name], MD Taking Active             Patient Active Problem List   Diagnosis Date Noted   Uncontrolled type 2 diabetes mellitus with hyperglycemia ([place]) 04/07/2019   Intractable hiccups 09/10/2018   Mixed hyperlipidemia 01/19/2017   Class 1 obesity due to excess calories without serious comorbidity with body mass index (BMI) of 32.0 to 32.9 in adult 10/14/2016   Hypertension associated with diabetes ([place]) 03/12/2016   Uncontrolled type 2 diabetes mellitus without complication, without long-term current use of insulin 05/16/2015   Essential hypertension, benign 05/16/2015   Tinea 11/02/2013   Type 2 diabetes mellitus with neurological complications ([place]) 49/70/2637    Immunization History  Administered Date(s) Administered   Fluad Quad(high Dose 65+) 03/29/2020   Influenza,  High Dose Seasonal PF 03/28/2015, 04/07/2016, 04/30/2016, 03/20/2017, 03/17/2018, 03/07/2019   Influenza,inj,Quad PF,6+ Mos 04/07/2016, 04/30/2016, 03/20/2017, 03/17/2018   Moderna Sars-Covid-2 Vaccination 07/06/2019, 08/03/2019, 05/02/2020   Pneumococcal Conjugate-13 06/30/2016   Pneumococcal Polysaccharide-23 07/15/2017   Tdap 12/27/2014    Conditions to be addressed/monitored: DMII  Care Plan : PHARMD MEDICATION MANAGEMENT  Updates made by Lavera Guise, [place] since 12/27/2020 12:00 AM     Problem: DISEASE PROGRESSION PREVENTION   Priority: High     Long-Range Goal: T2DM   Recent Progress: On track  Priority: High  Note:   Current Barriers:  Unable to independently afford treatment regimen Unable to achieve control of T2DM  Suboptimal therapeutic regimen for T2DM  Pharmacist Clinical Goal(s):  Over the next 90 days, patient will verbalize ability to afford treatment regimen achieve control of T2DM as evidenced by GOAL A1C<7%  through collaboration with PharmD and provider.    Interventions: 1:1 collaboration with [name], MD regarding development and update of comprehensive plan of care as evidenced by provider attestation and co-signature Inter-disciplinary care team collaboration (see longitudinal plan of care) Comprehensive medication review performed; medication list updated in electronic medical record  Diabetes: Uncontrolled; current treatment: LANTUS 70 units; GLIPIZIDE, METFORMIN; TRULICITY 8.58IF SQ WEEKLY Denies personal and family history of Medullary thyroid cancer ([place]) Patient enrolled IN [place] until [birth year] for TRULICITY 2.87OM--VEHMCNOBSJ TO SHIP TO PCP OFFICE DUE TO PHONE NOT ALWAYS WORKING Consider increasing Trulicity to 6.2EZ weekly, awaiting endocrine's approval A1c 8.2% Current glucose readings: fasting glucose: <135, post prandial glucose: N/A Denies hypoglycemic/hyperglycemic symptoms Discussed meal  planning options and Plate method for healthy eating Avoid sugary drinks and desserts Incorporate balanced protein, non starchy veggies, 1 serving of carbohydrate with each meal Increase water intake Increase physical activity as able Current exercise: N/A Educated on TRULICITY--MEDICATION PURPOSE & SIDE EFFECTS Recommended TRULICITY, DECREASE INSULIN TO 70 UNITS DAILY (DISCUSSED TREATMENT PLAN WITH DR. NIDA (ENDOCRINE); WORK TO DISCONTINUE GLIPIZIDE AS WE TITRATE GLP1 (AS NEEDED AND/OR TOLERATED) Assessed patient finances. Patient enrolled in [facility] [employer] patient assistance program for Trulicity until 66/29/47   Patient Goals/Self-Care Activities Over the next 90 days, patient will:  - take medications as prescribed check glucose DAILY (FASTING), document, and provide at future appointments  Follow Up Plan: Telephone follow up appointment with care management team member scheduled for: 1 MONTH      Medication Assistance:  trulicity obtained through [employer] [employer] medication assistance program.  Enrollment ends 06/15/21  Patient's preferred pharmacy is:  [facility] [address], [place] [address] [place] [address] Phone: [phone] Fax: [phone]  [facility] ([facility] - [place], [address] [place] [address] Phone: [phone] Fax: [phone]    Follow Up:  Patient agrees to Care Plan and Follow-up.  Plan: Telephone follow up appointment with care management team member scheduled for:  2 months  Regina Eck, PharmD, BCPS Clinical Pharmacist, [place]  II Phone [phone]

## 2021-01-24 ENCOUNTER — Other Ambulatory Visit: Payer: Self-pay

## 2021-01-24 ENCOUNTER — Encounter: Payer: Self-pay | Admitting: Family Medicine

## 2021-01-24 ENCOUNTER — Ambulatory Visit (INDEPENDENT_AMBULATORY_CARE_PROVIDER_SITE_OTHER): Payer: Medicare HMO | Admitting: Family Medicine

## 2021-01-24 VITALS — BP 131/66 | HR 65 | Temp 97.9°F | Ht 66.0 in | Wt 200.4 lb

## 2021-01-24 DIAGNOSIS — I10 Essential (primary) hypertension: Secondary | ICD-10-CM

## 2021-01-24 DIAGNOSIS — M47816 Spondylosis without myelopathy or radiculopathy, lumbar region: Secondary | ICD-10-CM | POA: Diagnosis not present

## 2021-01-24 DIAGNOSIS — E782 Mixed hyperlipidemia: Secondary | ICD-10-CM

## 2021-01-24 DIAGNOSIS — Z23 Encounter for immunization: Secondary | ICD-10-CM

## 2021-01-24 DIAGNOSIS — E1149 Type 2 diabetes mellitus with other diabetic neurological complication: Secondary | ICD-10-CM | POA: Diagnosis not present

## 2021-01-24 LAB — CBC WITH DIFFERENTIAL/PLATELET
Basophils Absolute: 0 10*3/uL (ref 0.0–0.2)
Basos: 0 %
EOS (ABSOLUTE): 0.2 10*3/uL (ref 0.0–0.4)
Eos: 2 %
Hematocrit: 43.2 % (ref 37.5–51.0)
Hemoglobin: 14.9 g/dL (ref 13.0–17.7)
Immature Grans (Abs): 0 10*3/uL (ref 0.0–0.1)
Immature Granulocytes: 0 %
Lymphocytes Absolute: 2.5 10*3/uL (ref 0.7–3.1)
Lymphs: 37 %
MCH: 30.2 pg (ref 26.6–33.0)
MCHC: 34.5 g/dL (ref 31.5–35.7)
MCV: 88 fL (ref 79–97)
Monocytes Absolute: 0.4 10*3/uL (ref 0.1–0.9)
Monocytes: 6 %
Neutrophils Absolute: 3.7 10*3/uL (ref 1.4–7.0)
Neutrophils: 55 %
Platelets: 246 10*3/uL (ref 150–450)
RBC: 4.93 x10E6/uL (ref 4.14–5.80)
RDW: 12.5 % (ref 11.6–15.4)
WBC: 6.8 10*3/uL (ref 3.4–10.8)

## 2021-01-24 LAB — CMP14+EGFR
ALT: 24 IU/L (ref 0–44)
AST: 19 IU/L (ref 0–40)
Albumin/Globulin Ratio: 2 (ref 1.2–2.2)
Albumin: 4.7 g/dL (ref 3.7–4.7)
Alkaline Phosphatase: 86 IU/L (ref 44–121)
BUN/Creatinine Ratio: 10 (ref 10–24)
BUN: 16 mg/dL (ref 8–27)
Bilirubin Total: 0.6 mg/dL (ref 0.0–1.2)
CO2: 23 mmol/L (ref 20–29)
Calcium: 10.1 mg/dL (ref 8.6–10.2)
Chloride: 102 mmol/L (ref 96–106)
Creatinine, Ser: 1.53 mg/dL — ABNORMAL HIGH (ref 0.76–1.27)
Globulin, Total: 2.4 g/dL (ref 1.5–4.5)
Glucose: 119 mg/dL — ABNORMAL HIGH (ref 65–99)
Potassium: 4.6 mmol/L (ref 3.5–5.2)
Sodium: 139 mmol/L (ref 134–144)
Total Protein: 7.1 g/dL (ref 6.0–8.5)
eGFR: 46 mL/min/{1.73_m2} — ABNORMAL LOW (ref >59)

## 2021-01-24 LAB — LIPID PANEL
Chol/HDL Ratio: 3.4 ratio (ref 0.0–5.0)
Cholesterol, Total: 126 mg/dL (ref 100–199)
HDL: 37 mg/dL — ABNORMAL LOW (ref >39)
LDL Chol Calc (NIH): 62 mg/dL (ref 0–99)
Triglycerides: 158 mg/dL — ABNORMAL HIGH (ref 0–149)
VLDL Cholesterol Cal: 27 mg/dL (ref 5–40)

## 2021-01-24 LAB — BAYER DCA HB A1C WAIVED: HB A1C (BAYER DCA - WAIVED): 7.9 % — ABNORMAL HIGH (ref <7.0)

## 2021-01-24 MED ORDER — CELECOXIB 400 MG PO CAPS: 400.0000 mg | ORAL_CAPSULE | Freq: Every day | ORAL | 3 refills | Status: DC

## 2021-01-24 NOTE — Progress Notes (Signed)
Subjective:  Patient ID: Patrick Moss,  male    DOB: [birth year]  Age: 80 y.o.    CC: Medical Management of Chronic Issues   HPI Kass L Doucette presents for  follow-up of hypertension. Patient has no history of headache chest pain or shortness of breath or recent cough. Patient also denies symptoms of TIA such as numbness weakness lateralizing. Patient denies side effects from medication. States taking it regularly.  Patient also  in for follow-up of elevated cholesterol. Doing well without complaints on current medication. Denies side effects  including myalgia and arthralgia and nausea. Also in today for liver function testing. Currently no chest pain, shortness of breath or other cardiovascular related symptoms noted.  Follow-up of diabetes. Patient does check blood sugar at home. Readings run a whole lot better since adding the weekly shot. 113-124 postprandial and fasting feels the Trulicity has made a big difference for him.  He is being followed by Dr. Dorris Fetch as well as Almyra Free, our clinical  pharmacist also for his diabetes. He has cut back on insulin also.  Patient denies symptoms such as excessive hunger or urinary frequency, excessive hunger, nausea No significant hypoglycemic spells noted. Medications reviewed. Pt reports taking them regularly. Pt. denies complication/adverse reaction today.   Back pain radiating to both legs. Some numbness. As well.  History of 2 bad lumbar disks. History Barak has a past medical history of Diabetes mellitus without complication ([place]), Hyperlipidemia, and Hypertension.   He has a past surgical history that includes R foot fx.   His family history includes Diabetes in his brother, father, and mother.He reports that he has never smoked. He has never used smokeless tobacco. He reports that he does not drink alcohol and does not use drugs.  Current Outpatient Medications on File Prior to Visit  Medication Sig Dispense Refill   ACCU-CHEK  SMARTVIEW test strip TEST BLOOD SUGAR TWO TO THREE TIMES DAILY AS DIRECTED 300 each 1   amLODipine (NORVASC) 5 MG tablet TAKE 1 TABLET (5 MG TOTAL) BY MOUTH DAILY. 90 tablet 1   aspirin 81 MG tablet Take 81 mg by mouth daily.     B-D ULTRAFINE III SHORT PEN 31G X 8 MM MISC USE  1  PEN  NEEDLE AS DIRECTED 270 each 3   Cholecalciferol (VITAMIN D3) 50 MCG (2000 UT) TABS Take by mouth.     Dulaglutide (TRULICITY) 2.80 KL/4.9ZP SOPN Inject into the skin.     glipiZIDE (GLUCOTROL XL) 5 MG 24 hr tablet Take 1 tablet (5 mg total) by mouth daily with breakfast. 90 tablet 1   LANTUS SOLOSTAR 100 UNIT/ML Solostar Pen INJECT 80 UNITS INTO THE SKIN AT BEDTIME (DISCARD USED PEN AFTER 28 DAYS) 75 mL 0   metFORMIN (GLUCOPHAGE-XR) 500 MG 24 hr tablet Take 1,000 mg by mouth daily after breakfast.     rosuvastatin (CRESTOR) 5 MG tablet Take 1 tablet (5 mg total) by mouth daily. 90 tablet 3   valsartan-hydrochlorothiazide (DIOVAN-HCT) 320-25 MG tablet Take 1 tablet by mouth daily. For Blood Pressure 90 tablet 3   No current facility-administered medications on file prior to visit.    ROS Review of Systems  Constitutional: Negative.   HENT: Negative.    Eyes:  Negative for visual disturbance.  Respiratory:  Negative for cough and shortness of breath.   Cardiovascular:  Negative for chest pain and leg swelling.  Gastrointestinal:  Negative for abdominal pain, diarrhea, nausea and vomiting.  Genitourinary:  Negative for difficulty  urinating.  Musculoskeletal:  Positive for back pain (feels weak, as opposed to painful). Negative for arthralgias and myalgias.  Skin:  Negative for rash.  Neurological:  Negative for headaches.  Psychiatric/Behavioral:  Negative for sleep disturbance.    Objective:  BP 131/66   Pulse 65   Temp 97.9 F (36.6 C)   Ht 5' 6"  (1.676 m)   Wt 200 lb 6.4 oz (90.9 kg)   SpO2 96%   BMI 32.35 kg/m   BP Readings from Last 3 Encounters:  01/24/21 131/66  11/15/20 138/62  10/24/20  (!) 159/69    Wt Readings from Last 3 Encounters:  01/24/21 200 lb 6.4 oz (90.9 kg)  11/15/20 206 lb 6.4 oz (93.6 kg)  10/24/20 205 lb 9.6 oz (93.3 kg)     Physical Exam Vitals reviewed.  Constitutional:      Appearance: He is well-developed.  HENT:     Head: Normocephalic and atraumatic.     Right Ear: External ear normal.     Left Ear: External ear normal.     Mouth/Throat:     Pharynx: No oropharyngeal exudate or posterior oropharyngeal erythema.  Eyes:     Pupils: Pupils are equal, round, and reactive to light.  Cardiovascular:     Rate and Rhythm: Normal rate and regular rhythm.     Heart sounds: No murmur heard. Pulmonary:     Effort: No respiratory distress.     Breath sounds: Normal breath sounds.  Musculoskeletal:        General: Normal range of motion.     Cervical back: Normal range of motion and neck supple.     Left lower leg: Edema (mild. Injured toes two weeks ago. No bruising, FROM) present.  Neurological:     Mental Status: He is alert and oriented to person, place, and time.    Diabetic Foot Exam - Simple   No data filed       Assessment & Plan:   Massimiliano was seen today for medical management of chronic issues.  Diagnoses and all orders for this visit:  Type 2 diabetes mellitus with neurological complications ([place]) -     Bayer DCA Hb A1c Waived -     CBC with Differential/Platelet  Mixed hyperlipidemia -     Lipid panel  Essential hypertension, benign -     CMP14+EGFR  Need for shingles vaccine -     Varicella-zoster vaccine IM (Shingrix)  Lumbar spondylosis -     celecoxib (CELEBREX) 400 MG capsule; Take 1 capsule (400 mg total) by mouth daily. With food  I have changed Cassie L. Hefter's celecoxib. I am also having him maintain his aspirin, metFORMIN, B-D ULTRAFINE III SHORT PEN, Accu-Chek SmartView, valsartan-hydrochlorothiazide, rosuvastatin, Vitamin D3, Lantus SoloStar, glipiZIDE, amLODipine, and Trulicity.  Meds ordered this  encounter  Medications   celecoxib (CELEBREX) 400 MG capsule    Sig: Take 1 capsule (400 mg total) by mouth daily. With food    Dispense:  90 capsule    Refill:  3     Follow-up: Return in about 3 months (around 04/26/2021).  [name], M.D.

## 2021-01-28 NOTE — Progress Notes (Signed)
Hello Tige,  Your lab result is normal and/or stable.Some minor variations that are not significant are commonly marked abnormal, but do not represent any medical problem for you.  Best regards, Mireille Lacombe, M.D.

## 2021-02-19 ENCOUNTER — Other Ambulatory Visit: Payer: Self-pay

## 2021-02-19 ENCOUNTER — Ambulatory Visit: Payer: Medicare HMO | Admitting: "Endocrinology

## 2021-02-19 ENCOUNTER — Encounter: Payer: Self-pay | Admitting: "Endocrinology

## 2021-02-19 VITALS — BP 138/72 | HR 80 | Ht 66.0 in | Wt 199.4 lb

## 2021-02-19 DIAGNOSIS — E782 Mixed hyperlipidemia: Secondary | ICD-10-CM | POA: Diagnosis not present

## 2021-02-19 DIAGNOSIS — E1165 Type 2 diabetes mellitus with hyperglycemia: Secondary | ICD-10-CM

## 2021-02-19 DIAGNOSIS — I10 Essential (primary) hypertension: Secondary | ICD-10-CM

## 2021-02-19 DIAGNOSIS — E559 Vitamin D deficiency, unspecified: Secondary | ICD-10-CM | POA: Diagnosis not present

## 2021-02-19 MED ORDER — TRULICITY 1.5 MG/0.5ML ~~LOC~~ SOAJ: 1.5000 mg | SUBCUTANEOUS | 2 refills | Status: DC

## 2021-02-19 MED ORDER — LANTUS SOLOSTAR 100 UNIT/ML ~~LOC~~ SOPN: 70.0000 [IU] | PEN_INJECTOR | Freq: Every day | SUBCUTANEOUS | 2 refills | Status: DC

## 2021-02-19 NOTE — Patient Instructions (Signed)

## 2021-02-19 NOTE — Progress Notes (Signed)
04/07/2019       Endocrinology follow-up note   Subjective:    Patient ID: Patrick Moss, male    DOB: 13-Jun-1941, PCP [name], MD   Past Medical History:  Diagnosis Date   Diabetes mellitus without complication ([place])    Hyperlipidemia    Hypertension    Past Surgical History:  Procedure Laterality Date   R foot fx     Social History   Socioeconomic History   Marital status: Divorced    Spouse name: Not on file   Number of children: 2   Years of education: 10   Highest education level: 10th grade  Occupational History   Occupation: retired  Tobacco Use   Smoking status: Never   Smokeless tobacco: Never  Scientific laboratory technician Use: Never used  Substance and Sexual Activity   Alcohol use: No    Alcohol/week: 0.0 standard drinks   Drug use: No   Sexual activity: Not Currently  Other Topics Concern   Not on file  Social History Narrative   Lives alone - son lives next door   [place] friend on his street - they check on each other several times per day and spend time together   Social Determinants of Health   Financial Resource Strain: Low Risk    Difficulty of Paying Living Expenses: Not hard at all  Food Insecurity: No Food Insecurity   Worried About Charity fundraiser in the Last Year: Never true   Arboriculturist in the Last Year: Never true  Transportation Needs: No Transportation Needs   Lack of Transportation (Medical): No   Lack of Transportation (Non-Medical): No  Physical Activity: Inactive   Days of Exercise per Week: 0 days   Minutes of Exercise per Session: 0 min  Stress: No Stress Concern Present   Feeling of Stress : Only a little  Social Connections: Moderately Integrated   Frequency of Communication with Friends and Family: More than three times a week   Frequency of Social Gatherings with Friends and Family: More than three times a week   Attends Religious Services: More than 4 times per year   Active Member of [employer] or Organizations:  Yes   Attends Archivist Meetings: More than 4 times per year   Marital Status: Divorced   Outpatient Encounter Medications as of 02/19/2021  Medication Sig   ACCU-CHEK SMARTVIEW test strip TEST BLOOD SUGAR TWO TO THREE TIMES DAILY AS DIRECTED   amLODipine (NORVASC) 5 MG tablet TAKE 1 TABLET (5 MG TOTAL) BY MOUTH DAILY.   aspirin 81 MG tablet Take 81 mg by mouth daily.   B-D ULTRAFINE III SHORT PEN 31G X 8 MM MISC USE  1  PEN  NEEDLE AS DIRECTED   celecoxib (CELEBREX) 400 MG capsule Take 1 capsule (400 mg total) by mouth daily. With food   Cholecalciferol (VITAMIN D3) 50 MCG (2000 UT) TABS Take by mouth.   Dulaglutide (TRULICITY) 1.5 JZ/7.9XT SOPN Inject 1.5 mg into the skin once a week.   insulin glargine (LANTUS SOLOSTAR) 100 UNIT/ML Solostar Pen Inject 70 Units into the skin at bedtime.   metFORMIN (GLUCOPHAGE-XR) 500 MG 24 hr tablet Take 1,000 mg by mouth daily after breakfast.   rosuvastatin (CRESTOR) 5 MG tablet Take 1 tablet (5 mg total) by mouth daily.   valsartan-hydrochlorothiazide (DIOVAN-HCT) 320-25 MG tablet Take 1 tablet by mouth daily. For Blood Pressure   [DISCONTINUED] Dulaglutide (TRULICITY) 0.56 PV/9.4IA SOPN Inject into  the skin.   [DISCONTINUED] glipiZIDE (GLUCOTROL XL) 5 MG 24 hr tablet Take 1 tablet (5 mg total) by mouth daily with breakfast.   [DISCONTINUED] LANTUS SOLOSTAR 100 UNIT/ML Solostar Pen INJECT 80 UNITS INTO THE SKIN AT BEDTIME (DISCARD USED PEN AFTER 28 DAYS)   No facility-administered encounter medications on file as of 02/19/2021.   ALLERGIES: Allergies  Allergen Reactions   Sulfamethoxazole-Trimethoprim Hives   VACCINATION STATUS: Immunization History  Administered Date(s) Administered   Fluad Quad(high Dose 65+) 03/29/2020   Influenza, High Dose Seasonal PF 03/28/2015, 04/07/2016, 04/30/2016, 03/20/2017, 03/17/2018, 03/07/2019   Influenza,inj,Quad PF,6+ Mos 04/07/2016, 04/30/2016, 03/20/2017, 03/17/2018   Moderna Sars-Covid-2 Vaccination  07/06/2019, 08/03/2019, 05/02/2020   Pneumococcal Conjugate-13 06/30/2016   Pneumococcal Polysaccharide-23 07/15/2017   Tdap 12/27/2014   Zoster Recombinat (Shingrix) 01/24/2021    Diabetes He presents for his follow-up diabetic visit. He has type 2 diabetes mellitus. Onset time: He was diagnosed at approximate age of 3 years. His disease course has been improving. There are no hypoglycemic associated symptoms. Pertinent negatives for hypoglycemia include no confusion, headaches, pallor or seizures. Associated symptoms include polydipsia and polyuria. Pertinent negatives for diabetes include no chest pain, no fatigue, no polyphagia and no weakness. There are no hypoglycemic complications. Symptoms are improving. Diabetic complications include nephropathy. Risk factors for coronary artery disease include diabetes mellitus, dyslipidemia, male sex, obesity and sedentary lifestyle. Current diabetic treatments: Lantus, Trulicity, metformin, glipizide. He is compliant with treatment most of the time. His weight is decreasing steadily. He is following a generally unhealthy diet. When asked about meal planning, he reported none. He participates in exercise intermittently. His home blood glucose trend is decreasing steadily. His breakfast blood glucose range is generally 130-140 mg/dl. His bedtime blood glucose range is generally 140-180 mg/dl. His overall blood glucose range is 140-180 mg/dl. (Patrick Moss presents with controlled glycemic profile at fasting, and improving postprandial readings.  His previsit labs show A1c of 7.9%, improving from 8.4%.  He did not document any hypoglycemia.   ) An ACE inhibitor/angiotensin II receptor blocker is being taken.  Hyperlipidemia This is a chronic problem. The current episode started more than 1 year ago. The problem is uncontrolled. Exacerbating diseases include chronic renal disease, diabetes and obesity. Pertinent negatives include no chest pain, myalgias or  shortness of breath. Current antihyperlipidemic treatment includes statins. Risk factors for coronary artery disease include dyslipidemia, hypertension, male sex, obesity and a sedentary lifestyle.  Hypertension This is a chronic problem. The current episode started more than 1 year ago. The problem is uncontrolled. Pertinent negatives include no chest pain, headaches, neck pain, palpitations or shortness of breath. Risk factors for coronary artery disease include dyslipidemia, diabetes mellitus, obesity and sedentary lifestyle. Past treatments include ACE inhibitors and angiotensin blockers. Hypertensive end-organ damage includes kidney disease. Identifiable causes of hypertension include chronic renal disease.   Review of systems  Constitutional: + Minimally fluctuating body weight,  current  Body mass index is 32.18 kg/m. , no fatigue, no subjective hyperthermia, no subjective hypothermia    Objective:    BP 138/72   Pulse 80   Ht 5' 6"  (1.676 m)   Wt 199 lb 6.4 oz (90.4 kg)   BMI 32.18 kg/m   Wt Readings from Last 3 Encounters:  02/19/21 199 lb 6.4 oz (90.4 kg)  01/24/21 200 lb 6.4 oz (90.9 kg)  11/15/20 206 lb 6.4 oz (93.6 kg)      Physical Exam- Limited  Constitutional:  Body mass index is 32.18 kg/m. ,  not in acute distress, normal state of mind    Recent Results (from the past 2160 hour(s))  Bayer DCA Hb A1c Waived     Status: Abnormal   Collection Time: 01/24/21  8:54 AM  Result Value Ref Range   HB A1C (BAYER DCA - WAIVED) 7.9 (H) <7.0 %    Comment:                                       Diabetic Adult            <7.0                                       Healthy Adult        4.3 - 5.7                                                           (DCCT/NGSP) American Diabetes Association's Summary of Glycemic Recommendations for Adults with Diabetes: Hemoglobin A1c <7.0%. More stringent glycemic goals (A1c <6.0%) may further reduce complications at the cost of  increased risk of hypoglycemia.   CBC with Differential/Platelet     Status: None   Collection Time: 01/24/21  9:02 AM  Result Value Ref Range   WBC 6.8 3.4 - 10.8 x10E3/uL   RBC 4.93 4.14 - 5.80 x10E6/uL   Hemoglobin 14.9 13.0 - 17.7 g/dL   Hematocrit 43.2 37.5 - 51.0 %   MCV 88 79 - 97 fL   MCH 30.2 26.6 - 33.0 pg   MCHC 34.5 31.5 - 35.7 g/dL   RDW 12.5 11.6 - 15.4 %   Platelets 246 150 - 450 x10E3/uL   Neutrophils 55 Not Estab. %   Lymphs 37 Not Estab. %   Monocytes 6 Not Estab. %   Eos 2 Not Estab. %   Basos 0 Not Estab. %   Neutrophils Absolute 3.7 1.4 - 7.0 x10E3/uL   Lymphocytes Absolute 2.5 0.7 - 3.1 x10E3/uL   Monocytes Absolute 0.4 0.1 - 0.9 x10E3/uL   EOS (ABSOLUTE) 0.2 0.0 - 0.4 x10E3/uL   Basophils Absolute 0.0 0.0 - 0.2 x10E3/uL   Immature Granulocytes 0 Not Estab. %   Immature Grans (Abs) 0.0 0.0 - 0.1 x10E3/uL  CMP14+EGFR     Status: Abnormal   Collection Time: 01/24/21  9:02 AM  Result Value Ref Range   Glucose 119 (H) 65 - 99 mg/dL   BUN 16 8 - 27 mg/dL   Creatinine, Ser 1.53 (H) 0.76 - 1.27 mg/dL   eGFR 46 (L) >59 mL/min/1.73   BUN/Creatinine Ratio 10 10 - 24   Sodium 139 134 - 144 mmol/L   Potassium 4.6 3.5 - 5.2 mmol/L   Chloride 102 96 - 106 mmol/L   CO2 23 20 - 29 mmol/L   Calcium 10.1 8.6 - 10.2 mg/dL   Total Protein 7.1 6.0 - 8.5 g/dL   Albumin 4.7 3.7 - 4.7 g/dL   Globulin, Total 2.4 1.5 - 4.5 g/dL   Albumin/Globulin Ratio 2.0 1.2 - 2.2   Bilirubin Total 0.6 0.0 - 1.2 mg/dL   Alkaline Phosphatase 86 44 -  121 IU/L   AST 19 0 - 40 IU/L   ALT 24 0 - 44 IU/L  Lipid panel     Status: Abnormal   Collection Time: 01/24/21  9:02 AM  Result Value Ref Range   Cholesterol, Total 126 100 - 199 mg/dL   Triglycerides 158 (H) 0 - 149 mg/dL   HDL 37 (L) >39 mg/dL   VLDL Cholesterol Cal 27 5 - 40 mg/dL   LDL Chol Calc (NIH) 62 0 - 99 mg/dL   Chol/HDL Ratio 3.4 0.0 - 5.0 ratio    Comment:                                   T. Chol/HDL Ratio                                              Men  Women                               1/2 Avg.Risk  3.4    3.3                                   Avg.Risk  5.0    4.4                                2X Avg.Risk  9.6    7.1                                3X Avg.Risk 23.4   11.0     Assessment & Plan:   1. Uncontrolled type 2 diabetes mellitus without complication, without long-term current use of insulin ([facility])   Pt remains at a high risk for more acute and chronic complications of diabetes which include CAD, CVA, CKD, retinopathy, and neuropathy. These are all discussed in detail with the patient.  Patrick Moss presents with controlled glycemic profile at fasting, and improving postprandial readings.  His previsit labs show A1c of 7.9%, improving from 8.4%.  He did not document any hypoglycemia.     -  Recent labs reviewed. - I have re-counseled the patient on diet management and weight loss  by adopting a carbohydrate restricted / protein rich  Diet.  - he acknowledges that there is a room for improvement in his food and drink choices. - Suggestion is made for him to avoid simple carbohydrates  from his diet including Cakes, Sweet Desserts, Ice Cream, Soda (diet and regular), Sweet Tea, Candies, Chips, Cookies, Store Bought Juices, Alcohol in Excess of  1-2 drinks a day, Artificial Sweeteners,  Coffee Creamer, and "Sugar-free" Products, Lemonade. This will help patient to have more stable blood glucose profile and potentially avoid unintended weight gain.   - Patient is advised to stick to a routine mealtimes to eat 3 meals  a day and avoid unnecessary snacks ( to snack only to correct hypoglycemia).  - I have approached patient with the following individualized plan to manage diabetes and patient agrees.   -Based on his presentation with tightly controlled  glycemic profile at fasting, he will be continued on lower dose of Lantus at 80 units nightly.  He is urged and agrees to monitor blood glucose twice  a day-daily before breakfast and at bedtime.     -Due to his mild CKD, he is advised to continue metformin at a dose lower dose-500 mg ER p.o. daily at breakfast.   -He has obtained assistance for coverage for Trulicity.  I discussed and increase his Trulicity to 1.5 mg subcutaneously weekly.  At this point he is advised to discontinue glipizide.    - Patient specific target  for A1c; LDL, HDL, Triglycerides were discussed in detail.  2) BP/HTN:  -His blood pressure is controlled to target.    He is advised to continue his current blood pressure medications including amlodipine 5 mg p.o. daily, Hyzaar 100-25 mg p.o. daily.   3) Lipids/HPL: His recent lipid panel showed controlled LDL at 62.  Triglycerides improving to 158. He is advised to continue Crestor 5 mg p.o. nightly.  Side effects and precautions discussed with him.     4)  Weight/Diet: His current BMI 32.1- -a candidate for some weight loss.  No success and weight loss, CDE consult in progress, exercise, and carbohydrates information provided.  5) vitamin D deficiency: He is advised to continue vitamin D3 5000 units daily for maintenance.     6) Chronic Care/Health Maintenance:  -Patient is on ACEI/ARB and Statin medications and encouraged to continue to follow up with Ophthalmology, Podiatrist at least yearly or according to recommendations, and advised to  stay away from smoking. I have recommended yearly flu vaccine and pneumonia vaccination at least every 5 years; moderate intensity exercise for up to 150 minutes weekly; and  sleep for at least 7 hours a day.  - I advised patient to maintain close follow up with [name], MD for primary care needs.    I spent 41 minutes in the care of the patient today including review of labs from [facility], Lipids, Thyroid Function, Hematology (current and previous including abstractions from other facilities); face-to-face time discussing  his blood glucose readings/logs, discussing  hypoglycemia and hyperglycemia episodes and symptoms, medications doses, his options of short and long term treatment based on the latest standards of care / guidelines;  discussion about incorporating lifestyle medicine;  and documenting the encounter.    Please refer to Patient Instructions for Blood Glucose Monitoring and Insulin/Medications Dosing Guide"  in media tab for additional information. Please  also refer to " Patient Self Inventory" in the Media  tab for reviewed elements of pertinent patient history.  Patrick Moss participated in the discussions, expressed understanding, and voiced agreement with the above plans.  All questions were answered to his satisfaction. he is encouraged to contact clinic should he have any questions or concerns prior to his return visit.   Follow up plan: -Return in about 3 months (around 05/21/2021) for Bring Meter and Logs- A1c in Office.  Glade Lloyd, MD Phone: [phone]  Fax: [phone]  -  This note was partially dictated with voice recognition software. Similar sounding words can be transcribed inadequately or may not  be corrected upon review.  02/19/2021, 9:36 AM

## 2021-02-27 ENCOUNTER — Telehealth: Payer: Self-pay

## 2021-02-27 ENCOUNTER — Telehealth: Payer: Self-pay | Admitting: Family Medicine

## 2021-03-01 ENCOUNTER — Other Ambulatory Visit: Payer: Self-pay

## 2021-03-01 ENCOUNTER — Ambulatory Visit (INDEPENDENT_AMBULATORY_CARE_PROVIDER_SITE_OTHER): Payer: Medicare HMO | Admitting: Pharmacist

## 2021-03-01 DIAGNOSIS — E1149 Type 2 diabetes mellitus with other diabetic neurological complication: Secondary | ICD-10-CM

## 2021-03-01 MED ORDER — TRULICITY 1.5 MG/0.5ML ~~LOC~~ SOAJ: 1.5000 mg | SUBCUTANEOUS | 4 refills | Status: DC

## 2021-03-01 MED ORDER — BASAGLAR KWIKPEN 100 UNIT/ML ~~LOC~~ SOPN: 70.0000 [IU] | PEN_INJECTOR | Freq: Every day | SUBCUTANEOUS | 4 refills | Status: DC

## 2021-03-01 NOTE — Progress Notes (Signed)
Chronic Care Management Pharmacy Note  03/01/2021 Name:  Patrick Moss MRN:  [id number] DOB:  1941/01/17  Summary:  T2DM  Recommendations/Changes made from today's visit:  Diabetes: Uncontrolled; current treatment: LANTUS 70 units; GLIPIZIDE, METFORMIN; TRULICITY 7.40CX SQ WEEKLY Denies personal and family history of Medullary thyroid cancer ([place]) Patient enrolled IN [place] until 44/81/85 for TRULICITY --MEDICATION TO SHIP TO PCP OFFICE DUE TO PHONE NOT ALWAYS WORKING ENDO INCREASED TRULICITY TO 6.3JS weekly [facility] CROSSROADS ON 9/16 WILL CHANGE TO BASAGLAR 65-70 UNITS DAILY (FREE [facility]IA [facility] PATIENT [facility]IST[facility]) E-[facility]CR[facility] RX CROSSROADS ON 9/16 PEN NEEDLES GIVEN TO PATIENT  A1c 8.2% Current glucose readings: fasting glucose: <135, post prandial glucose: N/A Denies hypoglycemic/hyperglycemic symptoms Discussed meal planning options and Plate method for healthy eating Avoid sugary drinks and desserts Incorporate balanced protein, non starchy veggies, 1 serving of carbohydrate with each meal Increase water intake Increase physical activity as able Current exercise: N/A Educated on TRULICITY--MEDICATION PURPOSE & SIDE EFFECTS Recommended TRULICITY, DECREASE INSULIN TO 65-70 UNITS DAILY (DISCUSSED TREATMENT PLAN WITH DR. NIDA (ENDOCRINE); WORK TO DISCONTINUE GLIPIZIDE AS WE TITRATE GLP1 (AS NEEDED AND/OR TOLERATED) Assessed patient finances. Patient enrolled in [facility] patient assistance program for Trulicity until 97/02/63  Follow Up Plan: Telephone follow up appointment with care management team member scheduled for: 1 MONTH   Subjective: Patrick Moss is an 80 y.o. year old male who is a primary patient of [name], [name], MD.  The CCM team was consulted for assistance with disease management and care coordination needs.    Engaged with patient face to face for follow up visit in  response to provider referral for pharmacy case management and/or care coordination services.   Consent to Services:  The patient was given information about Chronic Care Management services, agreed to services, and gave verbal consent prior to initiation of services.  Please see initial visit note for detailed documentation.   Patient Care Team: [name], MD as PCP - General (Family Medicine) Cassandria Anger, MD as Consulting Physician (Endocrinology) Katy Fitch, Darlina Guys, MD as Consulting Physician (Ophthalmology) Lavera Guise, [facility] as Pharmacist (Family Medicine)  Objective:  Lab Results  Component Value Date   CREATININE 1.53 (H) 01/24/2021   CREATININE 1.36 (H) 10/24/2020   CREATININE 1.48 (H) 07/26/2020    Lab Results  Component Value Date   HGBA1C 7.9 (H) 01/24/2021   Last diabetic Eye exam:  Lab Results  Component Value Date/Time   HMDIABEYEEXA No Retinopathy 09/11/2020 12:00 AM    Last diabetic Foot exam: No results found for: HMDIABFOOTEX      Component Value Date/Time   CHOL 126 01/24/2021 0902   TRIG 158 (H) 01/24/2021 0902   HDL 37 (L) 01/24/2021 0902   CHOLHDL 3.4 01/24/2021 0902   LDLCALC 62 01/24/2021 0902    Hepatic Function Latest Ref Rng & Units 01/24/2021 10/24/2020 07/26/2020  Total Protein 6.0 - 8.5 g/dL 7.1 7.0 6.9  Albumin 3.7 - 4.7 g/dL 4.7 4.6 4.7  AST 0 - 40 IU/L 19 19 18   ALT 0 - 44 IU/L 24 24 25   Alk Phosphatase 44 - 121 IU/L 86 76 93  Total Bilirubin 0.0 - 1.2 mg/dL 0.6 0.5 0.4    Lab Results  Component Value Date/Time   TSH 3.610 12/02/2019 09:10 AM   TSH 3.450 08/29/2019 09:45 AM   FREET4 1.41 12/02/2019 09:10 AM   FREET4 1.27 08/29/2019  09:45 AM    CBC Latest Ref Rng & Units 01/24/2021 10/24/2020 07/26/2020  WBC 3.4 - 10.8 x10E3/uL 6.8 6.9 6.4  Hemoglobin 13.0 - 17.7 g/dL 14.9 15.2 15.1  Hematocrit 37.5 - 51.0 % 43.2 44.2 44.9  Platelets 150 - 450 x10E3/uL 246 164 203    Lab Results  Component Value Date/Time    VD25OH 40.4 07/26/2020 09:01 AM   VD25OH 19.4 (L) 03/29/2020 09:48 AM    Clinical ASCVD: No  The ASCVD Risk score (Arnett DK, et al., 2019) failed to calculate for the following reasons:   The valid total cholesterol range is 130 to 320 mg/dL    Other: (CHADS2VASc if Afib, PHQ9 if depression, MMRC or CAT for COPD, ACT, DEXA)  Social History   Tobacco Use  Smoking Status Never  Smokeless Tobacco Never   BP Readings from Last 3 Encounters:  02/19/21 138/72  01/24/21 131/66  11/15/20 138/62   Pulse Readings from Last 3 Encounters:  02/19/21 80  01/24/21 65  11/15/20 68   Wt Readings from Last 3 Encounters:  02/19/21 199 lb 6.4 oz (90.4 kg)  01/24/21 200 lb 6.4 oz (90.9 kg)  11/15/20 206 lb 6.4 oz (93.6 kg)    Assessment: Review of patient past medical history, allergies, medications, health status, including review of consultants reports, laboratory and other test data, was performed as part of comprehensive evaluation and provision of chronic care management services.   SDOH:  (Social Determinants of Health) assessments and interventions performed:    CCM Care Plan  Allergies  Allergen Reactions   Sulfamethoxazole-Trimethoprim Hives    Medications Reviewed Today     Reviewed by Cassandria Anger, MD (Physician) on 02/19/21 at [place] List Status: <None>   Medication Order Taking? Sig Documenting Provider Last Dose Status Informant  ACCU-CHEK SMARTVIEW test strip [id number] No TEST BLOOD SUGAR TWO TO THREE TIMES DAILY AS DIRECTED Nida, Marella Chimes, MD Taking Active   amLODipine (NORVASC) 5 MG tablet [id number] No TAKE 1 TABLET (5 MG TOTAL) BY MOUTH DAILY. [name], MD Taking Active   aspirin 81 MG tablet [id number] No Take 81 mg by mouth daily. [provider] Taking Active   B-D ULTRAFINE III SHORT PEN 31G X 8 MM MISC [id number] No USE  1  PEN  NEEDLE AS DIRECTED Nida, Marella Chimes, MD Taking Active   celecoxib (CELEBREX) 400 MG capsule  [id number]  Take 1 capsule (400 mg total) by mouth daily. With food [name], MD  Active   Cholecalciferol (VITAMIN D3) 50 MCG (2000 UT) TABS [id number] No Take by mouth. [provider] Taking Active   Dulaglutide (TRULICITY) 1.5 SM/2.7MB SOPN [id number] Yes Inject 1.5 mg into the skin once a week. Cassandria Anger, MD  Active   insulin glargine (LANTUS SOLOSTAR) 100 UNIT/ML Solostar Pen [id number]  Inject 70 Units into the skin at bedtime. Cassandria Anger, MD  Active   metFORMIN (GLUCOPHAGE-XR) 500 MG 24 hr tablet [id number] No Take 1,000 mg by mouth daily after breakfast. [provider] Taking Active            Med Note Georgiann Cocker, AMY E   Tue Oct 02, 2020  8:39 AM) Taking one per day  rosuvastatin (CRESTOR) 5 MG tablet [id number] No Take 1 tablet (5 mg total) by mouth daily. [name], MD Taking Active   valsartan-hydrochlorothiazide (DIOVAN-HCT) 320-25 MG tablet [id number] No Take 1 tablet by mouth daily. For Blood Pressure [name], MD Taking Active  Patient Active Problem List   Diagnosis Date Noted   Uncontrolled type 2 diabetes mellitus with hyperglycemia ([place]) 04/07/2019   Intractable hiccups 09/10/2018   Mixed hyperlipidemia 01/19/2017   Class 1 obesity due to excess calories without serious comorbidity with body mass index (BMI) of 32.0 to 32.9 in adult 10/14/2016   Hypertension associated with diabetes ([place]) 03/12/2016   Uncontrolled type 2 diabetes mellitus without complication, without long-term current use of insulin 05/16/2015   Essential hypertension, benign 05/16/2015   Tinea 11/02/2013   Type 2 diabetes mellitus with neurological complications ([place]) 32/44/0102    Immunization History  Administered Date(s) Administered   Fluad Quad(high Dose 65+) 03/29/2020   Influenza, High Dose Seasonal PF 03/28/2015, 04/07/2016, 04/30/2016, 03/20/2017, 03/17/2018, 03/07/2019   Influenza,inj,Quad PF,6+ Mos 04/07/2016, 04/30/2016,  03/20/2017, 03/17/2018   Moderna Sars-Covid-2 Vaccination 07/06/2019, 08/03/2019, 05/02/2020   Pneumococcal Conjugate-13 06/30/2016   Pneumococcal Polysaccharide-23 07/15/2017   Tdap 12/27/2014   Zoster Recombinat (Shingrix) 01/24/2021    Conditions to be addressed/monitored: DMII  Care Plan : PHARMD MEDICATION MANAGEMENT  Updates made by Lavera Guise, [place] since 03/01/2021 12:00 AM     Problem: DISEASE PROGRESSION PREVENTION   Priority: High     Long-Range Goal: T2DM   Recent Progress: On track  Priority: High  Note:   Current Barriers:  Unable to independently afford treatment regimen Unable to achieve control of T2DM  Suboptimal therapeutic regimen for T2DM  Pharmacist Clinical Goal(s):  Over the next 90 days, patient will verbalize ability to afford treatment regimen achieve control of T2DM as evidenced by GOAL A1C<7%  through collaboration with PharmD and provider.    Interventions: 1:1 collaboration with [name], MD regarding development and update of comprehensive plan of care as evidenced by provider attestation and co-signature Inter-disciplinary care team collaboration (see longitudinal plan of care) Comprehensive medication review performed; medication list updated in electronic medical record  Diabetes: Uncontrolled; current treatment: LANTUS 70 units; GLIPIZIDE, METFORMIN; TRULICITY 7.25DG SQ WEEKLY Denies personal and family history of Medullary thyroid cancer ([place]) Patient enrolled IN [facility] until 64/40/34 for TRULICITY --MEDICATION TO SHIP TO PCP OFFICE DUE TO PHONE NOT ALWAYS WORKING ENDO INCREASED TRULICITY TO 7.4QV weekly [facility]CR[facility]ED [facility] RX CROSSROADS ON 9/16 WILL CHANGE TO BASAGLAR 65-70 UNITS DAILY (FREE [facility]) E-SCRIBED TO [facility] ON 9/16 PEN NEEDLES GIVEN TO PATIENT  A1c 8.2% Current glucose readings: fasting glucose: <135, post  prandial glucose: N/A Denies hypoglycemic/hyperglycemic symptoms Discussed meal planning options and Plate method for healthy eating Avoid sugary drinks and desserts Incorporate balanced protein, non starchy veggies, 1 serving of carbohydrate with each meal Increase water intake Increase physical activity as able Current exercise: N/A Educated on TRULICITY--MEDICATION PURPOSE & SIDE EFFECTS Recommended TRULICITY, DECREASE INSULIN TO 65-70 UNITS DAILY (DISCUSSED TREATMENT PLAN WITH DR. NIDA (ENDOCRINE); WORK TO DISCONTINUE GLIPIZIDE AS WE TITRATE GLP1 (AS NEEDED AND/OR TOLERATED) Assessed patient finances. Patient enrolled in [facility] patient assistance program for Trulicity until 95/63/87   Patient Goals/Self-Care Activities Over the next 90 days, patient will:  - take medications as prescribed check glucose DAILY (FASTING), document, and provide at future appointments  Follow Up Plan: Telephone follow up appointment with care management team member scheduled for: 1 MONTH      Medication Assistance:  TRULICITY obtained through [facility] medication assistance program.  Enrollment ends 06/15/21  Patient's preferred pharmacy is:  [facility] [address] [place]  [address] [place] [address] Phone: [phone] Fax: [phone]  [facility] ([facility]) - [address], [place] [address] Phone: [phone] Fax: [phone]  [facility] [place], [place] - [address] [place] [address] Phone: [phone] Fax: [phone]  Follow Up:  Patient agrees to Care Plan and Follow-up.  Plan: Telephone follow up appointment with care management team member scheduled for:  1 MONTH  Regina Eck, PharmD, BCPS Clinical Pharmacist, [place]  II Phone  [phone]

## 2021-03-01 NOTE — Patient Instructions (Signed)
Visit Information  PATIENT GOALS:  Goals Addressed               This Visit's Progress     Patient Stated     T2DM-PHARMD (pt-stated)        Current Barriers:  Unable to independently afford treatment regimen Unable to achieve control of T2DM  Suboptimal therapeutic regimen for T2DM  Pharmacist Clinical Goal(s):  Over the next 90 days, patient will verbalize ability to afford treatment regimen achieve control of T2DM as evidenced by GOAL A1C<7% through collaboration with PharmD and provider.    Interventions: 1:1 collaboration with [name], MD regarding development and update of comprehensive plan of care as evidenced by provider attestation and co-signature Inter-disciplinary care team collaboration (see longitudinal plan of care) Comprehensive medication review performed; medication list updated in electronic medical record  Diabetes: Uncontrolled; current treatment: LANTUS 70 units; GLIPIZIDE, METFORMIN; TRULICITY 0.'75MG'$  SQ WEEKLY Denies personal and family history of Medullary thyroid cancer ([place]) Patient enrolled IN [place] until [id number] for TRULICITY --MEDICATION TO SHIP TO PCP OFFICE DUE TO PHONE NOT ALWAYS WORKING ENDO INCREASED TRULICITY TO 1.'5mg'$  weekly [facility] CROSSROADS ON 9/16 WILL CHANGE TO BASAGLAR 65-70 UNITS DAILY (FREE [facility]) E-[facility]CR[facility]ED TO [facility] ON 9/16 PEN NEEDLES GIVEN TO PATIENT  A1c 8.2% Current glucose readings: fasting glucose: <135, post prandial glucose: N/A Denies hypoglycemic/hyperglycemic symptoms Discussed meal planning options and Plate method for healthy eating Avoid sugary drinks and desserts Incorporate balanced protein, non starchy veggies, 1 serving of carbohydrate with each meal Increase water intake Increase physical activity as able Current exercise: N/A Educated on TRULICITY--MEDICATION PURPOSE & SIDE  EFFECTS Recommended TRULICITY, DECREASE INSULIN TO 65-70 UNITS DAILY (DISCUSSED TREATMENT PLAN WITH DR. NIDA (ENDOCRINE); WORK TO DISCONTINUE GLIPIZIDE AS WE TITRATE GLP1 (AS NEEDED AND/OR TOLERATED) Assessed patient finances. Patient enrolled in [facility] patient assistance program for Trulicity until [id number]   Patient Goals/Self-Care Activities Over the next 90 days, patient will:  - take medications as prescribed check glucose DAILY (FASTING), document, and provide at future appointments  Follow Up Plan: Telephone follow up appointment with care management team member scheduled for: 1 MONTH         The patient verbalized understanding of instructions, educational materials, and care plan provided today and declined offer to receive copy of patient instructions, educational materials, and care plan.   Telephone follow up appointment with care management team member scheduled for: 1 MONTH  Regina Eck, PharmD, BCPS Clinical Pharmacist, [place]  II Phone [phone]

## 2021-03-08 ENCOUNTER — Ambulatory Visit: Payer: Medicare HMO | Admitting: Pharmacist

## 2021-03-08 DIAGNOSIS — E1149 Type 2 diabetes mellitus with other diabetic neurological complication: Secondary | ICD-10-CM

## 2021-03-12 DIAGNOSIS — H40023 Open angle with borderline findings, high risk, bilateral: Secondary | ICD-10-CM | POA: Diagnosis not present

## 2021-03-12 DIAGNOSIS — Z961 Presence of intraocular lens: Secondary | ICD-10-CM | POA: Diagnosis not present

## 2021-03-12 DIAGNOSIS — E119 Type 2 diabetes mellitus without complications: Secondary | ICD-10-CM | POA: Diagnosis not present

## 2021-03-12 DIAGNOSIS — Z794 Long term (current) use of insulin: Secondary | ICD-10-CM | POA: Diagnosis not present

## 2021-03-12 NOTE — Patient Instructions (Signed)
Visit Information  PATIENT GOALS:  Goals Addressed               This Visit's Progress     Patient Stated     T2DM-PHARMD (pt-stated)        Current Barriers:  Unable to independently afford treatment regimen Unable to achieve control of T2DM  Suboptimal therapeutic regimen for T2DM  Pharmacist Clinical Goal(s):  Over the next 90 days, patient will verbalize ability to afford treatment regimen achieve control of T2DM as evidenced by GOAL A1C<7% through collaboration with PharmD and provider.    Interventions: 1:1 collaboration with [name], MD regarding development and update of comprehensive plan of care as evidenced by provider attestation and co-signature Inter-disciplinary care team collaboration (see longitudinal plan of care) Comprehensive medication review performed; medication list updated in electronic medical record  Diabetes: Uncontrolled; current treatment: LANTUS 70 units; GLIPIZIDE, METFORMIN; TRULICITY 0.75MG  SQ WEEKLY Denies personal and family history of Medullary thyroid cancer ([place]) Patient enrolled IN [place] until 83/15/17 for TRULICITY --MEDICATION TO SHIP TO PCP OFFICE DUE TO PHONE NOT ALWAYS WORKING INCREASED TRULICITY TO 1.5mg  weekly E-SCRIBED TO [facility] ON 9/16 SHIPMENT ARRIVED TO PCP OFFICE TODAY WILL CHANGE TO BASAGLAR 52-70 UNITS DAILY ([facility]) E-SCRIBED TO [facility] ON 9/16 SHIPMENT ARRIVED TO PCP OFFICE TODAY PEN NEEDLES GIVEN TO PATIENT  A1c 8.2% Current glucose readings: fasting glucose: <135, post prandial glucose: N/A Denies hypoglycemic/hyperglycemic symptoms Discussed meal planning options and Plate method for healthy eating Avoid sugary drinks and desserts Incorporate balanced protein, non starchy veggies, 1 serving of carbohydrate with each meal Increase water intake Increase physical activity as able Current  exercise: N/A Educated on TRULICITY--MEDICATION PURPOSE & SIDE EFFECTS Recommended TRULICITY, DECREASE INSULIN TO 65-70 UNITS DAILY (DISCUSSED TREATMENT PLAN WITH DR. NIDA (ENDOCRINE); WORK TO DISCONTINUE GLIPIZIDE AS WE TITRATE GLP1 (AS NEEDED AND/OR TOLERATED) Assessed patient finances. Patient enrolled in [facility] patient assistance program for Trulicity & BASAGLAR until 06/15/21   Patient Goals/Self-Care Activities Over the next 90 days, patient will:  - take medications as prescribed check glucose DAILY (FASTING), document, and provide at future appointments  Follow Up Plan: Telephone follow up appointment with care management team member scheduled for: 1 MONTH         The patient verbalized understanding of instructions, educational materials, and care plan provided today and declined offer to receive copy of patient instructions, educational materials, and care plan.   Telephone follow up appointment with care management team member scheduled for:  Signature Regina Eck, PharmD, BCPS Clinical Pharmacist, [place]  II Phone [phone]

## 2021-03-12 NOTE — Progress Notes (Signed)
Chronic Care Management Pharmacy Note  03/08/2021 Name:  Patrick Moss MRN:  [id number] DOB:  04/19/1941  Summary: T2DM  Recommendations/Changes made from today's visit: Diabetes: Uncontrolled; current treatment: LANTUS 70 units; GLIPIZIDE, METFORMIN; TRULICITY 0.3KV SQ WEEKLY Denies personal and family history of Medullary thyroid cancer ([place]) Patient enrolled IN [place] until 42/59/56 for TRULICITY --MEDICATION TO SHIP TO PCP OFFICE DUE TO PHONE NOT ALWAYS WORKING INCREASED TRULICITY TO 3.8VF weekly E-SCRIBED TO [place] ON 9/16 SHIPMENT ARRIVED TO PCP OFFICE TODAY WILL CHANGE TO BASAGLAR 65-70 UNITS DAILY (FREE VIA [employer] PATIENT ASSISTANCE) E-SCRIBED TO [place] ON 9/16 SHIPMENT ARRIVED TO PCP OFFICE TODAY PEN NEEDLES GIVEN TO PATIENT  A1c 8.2% Current glucose readings: fasting glucose: <135, post prandial glucose: N/A Denies hypoglycemic/hyperglycemic symptoms Discussed meal planning options and Plate method for healthy eating Avoid sugary drinks and desserts Incorporate balanced protein, non starchy veggies, 1 serving of carbohydrate with each meal Increase water intake Increase physical activity as able Current exercise: N/A Educated on TRULICITY--MEDICATION PURPOSE & SIDE EFFECTS Recommended TRULICITY, DECREASE INSULIN TO 65-70 UNITS DAILY (DISCUSSED TREATMENT PLAN WITH DR. NIDA (ENDOCRINE); WORK TO DISCONTINUE GLIPIZIDE AS WE TITRATE GLP1 (AS NEEDED AND/OR TOLERATED) Assessed patient finances. Patient enrolled in [employer] patient assistance program for Trulicity & BASAGLAR until 06/15/21  Follow Up Plan: Telephone follow up appointment with care management team member scheduled for: 1 MONTH   Subjective: DOMINGOS RIGGI is an 80 y.o. year old male who is a primary patient of [name], [name], MD.  The CCM team was consulted for assistance with disease management and care  coordination needs.    Engaged with patient by telephone for follow up visit in response to provider referral for pharmacy case management and/or care coordination services.   Consent to Services:  The patient was given information about Chronic Care Management services, agreed to services, and gave verbal consent prior to initiation of services.  Please see initial visit note for detailed documentation.   Patient Care Team: [name], MD as PCP - General (Family Medicine) Cassandria Anger, MD as Consulting Physician (Endocrinology) Katy Fitch, Darlina Guys, MD as Consulting Physician (Ophthalmology) Lavera Guise, [facility] as Pharmacist (Family Medicine)  Objective:  Lab Results  Component Value Date   CREATININE 1.53 (H) 01/24/2021   CREATININE 1.36 (H) 10/24/2020   CREATININE 1.48 (H) 07/26/2020    Lab Results  Component Value Date   HGBA1C 7.9 (H) 01/24/2021   Last diabetic Eye exam:  Lab Results  Component Value Date/Time   HMDIABEYEEXA No Retinopathy 09/11/2020 12:00 AM    Last diabetic Foot exam: No results found for: HMDIABFOOTEX      Component Value Date/Time   CHOL 126 01/24/2021 0902   TRIG 158 (H) 01/24/2021 0902   HDL 37 (L) 01/24/2021 0902   CHOLHDL 3.4 01/24/2021 0902   LDLCALC 62 01/24/2021 0902    Hepatic Function Latest Ref Rng & Units 01/24/2021 10/24/2020 07/26/2020  Total Protein 6.0 - 8.5 g/dL 7.1 7.0 6.9  Albumin 3.7 - 4.7 g/dL 4.7 4.6 4.7  AST 0 - 40 IU/L 19 19 18   ALT 0 - 44 IU/L 24 24 25   Alk Phosphatase 44 - 121 IU/L 86 76 93  Total Bilirubin 0.0 - 1.2 mg/dL 0.6 0.5 0.4    Lab Results  Component Value Date/Time   TSH 3.610 12/02/2019 09:10 AM   TSH 3.450 08/29/2019 09:45 AM  FREET4 1.41 12/02/2019 09:10 AM   FREET4 1.27 08/29/2019 09:45 AM    CBC Latest Ref Rng & Units 01/24/2021 10/24/2020 07/26/2020  WBC 3.4 - 10.8 x10E3/uL 6.8 6.9 6.4  Hemoglobin 13.0 - 17.7 g/dL 14.9 15.2 15.1  Hematocrit 37.5 - 51.0 % 43.2 44.2 44.9   Platelets 150 - 450 x10E3/uL 246 164 203    Lab Results  Component Value Date/Time   VD25OH 40.4 07/26/2020 09:01 AM   VD25OH 19.4 (L) 03/29/2020 09:48 AM    Clinical ASCVD: No  The ASCVD Risk score (Arnett DK, et al., 2019) failed to calculate for the following reasons:   The valid total cholesterol range is 130 to 320 mg/dL    Other: (CHADS2VASc if Afib, PHQ9 if depression, MMRC or CAT for COPD, ACT, DEXA)  Social History   Tobacco Use  Smoking Status Never  Smokeless Tobacco Never   BP Readings from Last 3 Encounters:  02/19/21 138/72  01/24/21 131/66  11/15/20 138/62   Pulse Readings from Last 3 Encounters:  02/19/21 80  01/24/21 65  11/15/20 68   Wt Readings from Last 3 Encounters:  02/19/21 199 lb 6.4 oz (90.4 kg)  01/24/21 200 lb 6.4 oz (90.9 kg)  11/15/20 206 lb 6.4 oz (93.6 kg)    Assessment: Review of patient past medical history, allergies, medications, health status, including review of consultants reports, laboratory and other test data, was performed as part of comprehensive evaluation and provision of chronic care management services.   SDOH:  (Social Determinants of Health) assessments and interventions performed:    CCM Care Plan  Allergies  Allergen Reactions   Sulfamethoxazole-Trimethoprim Hives    Medications Reviewed Today     Reviewed by Cassandria Anger, MD (Physician) on 02/19/21 at [place] List Status: <None>   Medication Order Taking? Sig Documenting Provider Last Dose Status Informant  ACCU-CHEK SMARTVIEW test strip [id number] No TEST BLOOD SUGAR TWO TO THREE TIMES DAILY AS DIRECTED Nida, Marella Chimes, MD Taking Active   amLODipine (NORVASC) 5 MG tablet [id number] No TAKE 1 TABLET (5 MG TOTAL) BY MOUTH DAILY. [name], MD Taking Active   aspirin 81 MG tablet [id number] No Take 81 mg by mouth daily. [provider] Taking Active   B-D ULTRAFINE III SHORT PEN 31G X 8 MM MISC [id number] No USE  1  PEN  NEEDLE AS  DIRECTED Nida, Marella Chimes, MD Taking Active   celecoxib (CELEBREX) 400 MG capsule [id number]  Take 1 capsule (400 mg total) by mouth daily. With food [name], MD  Active   Cholecalciferol (VITAMIN D3) 50 MCG (2000 UT) TABS [id number] No Take by mouth. [provider] Taking Active   Dulaglutide (TRULICITY) 1.5 MY/1.1ZN SOPN [id number] Yes Inject 1.5 mg into the skin once a week. Cassandria Anger, MD  Active   insulin glargine (LANTUS SOLOSTAR) 100 UNIT/ML Solostar Pen [id number]  Inject 70 Units into the skin at bedtime. Cassandria Anger, MD  Active   metFORMIN (GLUCOPHAGE-XR) 500 MG 24 hr tablet [id number] No Take 1,000 mg by mouth daily after breakfast. [provider] Taking Active            Med Note Georgiann Cocker, AMY E   Tue Oct 02, 2020  8:39 AM) Taking one per day  rosuvastatin (CRESTOR) 5 MG tablet [id number] No Take 1 tablet (5 mg total) by mouth daily. [name], MD Taking Active   valsartan-hydrochlorothiazide (DIOVAN-HCT) 320-25 MG tablet [id number] No Take 1 tablet by mouth  daily. For Blood Pressure [name], MD Taking Active             Patient Active Problem List   Diagnosis Date Noted   Uncontrolled type 2 diabetes mellitus with hyperglycemia ([place]) 04/07/2019   Intractable hiccups 09/10/2018   Mixed hyperlipidemia 01/19/2017   Class 1 obesity due to excess calories without serious comorbidity with body mass index (BMI) of 32.0 to 32.9 in adult 10/14/2016   Hypertension associated with diabetes ([place]) 03/12/2016   Uncontrolled type 2 diabetes mellitus without complication, without long-term current use of insulin 05/16/2015   Essential hypertension, benign 05/16/2015   Tinea 11/02/2013   Type 2 diabetes mellitus with neurological complications ([place]) 20/03/711    Immunization History  Administered Date(s) Administered   Fluad Quad(high Dose 65+) 03/29/2020   Influenza, High Dose Seasonal PF 03/28/2015, 04/07/2016, 04/30/2016,  03/20/2017, 03/17/2018, 03/07/2019   Influenza,inj,Quad PF,6+ Mos 04/07/2016, 04/30/2016, 03/20/2017, 03/17/2018   Moderna Sars-Covid-2 Vaccination 07/06/2019, 08/03/2019, 05/02/2020   Pneumococcal Conjugate-13 06/30/2016   Pneumococcal Polysaccharide-23 07/15/2017   Tdap 12/27/2014   Zoster Recombinat (Shingrix) 01/24/2021    Conditions to be addressed/monitored: DMII  Care Plan : PHARMD MEDICATION MANAGEMENT  Updates made by Lavera Guise, [place] since 03/12/2021 12:00 AM     Problem: DISEASE PROGRESSION PREVENTION   Priority: High     Long-Range Goal: T2DM   Recent Progress: On track  Priority: High  Note:   Current Barriers:  Unable to independently afford treatment regimen Unable to achieve control of T2DM  Suboptimal therapeutic regimen for T2DM  Pharmacist Clinical Goal(s):  Over the next 90 days, patient will verbalize ability to afford treatment regimen achieve control of T2DM as evidenced by GOAL A1C<7%  through collaboration with PharmD and provider.    Interventions: 1:1 collaboration with [name], MD regarding development and update of comprehensive plan of care as evidenced by provider attestation and co-signature Inter-disciplinary care team collaboration (see longitudinal plan of care) Comprehensive medication review performed; medication list updated in electronic medical record  Diabetes: Uncontrolled; current treatment: LANTUS 70 units; GLIPIZIDE, METFORMIN; TRULICITY 1.97JO SQ WEEKLY Denies personal and family history of Medullary thyroid cancer ([place]) Patient enrolled IN [place] until [birth year] for TRULICITY --MEDICATION TO SHIP TO PCP OFFICE DUE TO PHONE NOT ALWAYS WORKING INCREASED TRULICITY TO 8.2ME weekly E-SCRIBED TO [place] ON 9/16 SHIPMENT ARRIVED TO PCP OFFICE TODAY WILL CHANGE TO BASAGLAR 1-70 UNITS DAILY (FREE VIA [employer] PATIENT ASSISTANCE) E-SCRIBED TO [place] ON 9/16 SHIPMENT ARRIVED TO PCP OFFICE TODAY PEN NEEDLES GIVEN TO PATIENT  A1c 8.2% Current glucose readings: fasting glucose: <135, post prandial glucose: N/A Denies hypoglycemic/hyperglycemic symptoms Discussed meal planning options and Plate method for healthy eating Avoid sugary drinks and desserts Incorporate balanced protein, non starchy veggies, 1 serving of carbohydrate with each meal Increase water intake Increase physical activity as able Current exercise: N/A Educated on TRULICITY--MEDICATION PURPOSE & SIDE EFFECTS Recommended TRULICITY, DECREASE INSULIN TO 65-70 UNITS DAILY (DISCUSSED TREATMENT PLAN WITH DR. NIDA (ENDOCRINE); WORK TO DISCONTINUE GLIPIZIDE AS WE TITRATE GLP1 (AS NEEDED AND/OR TOLERATED) Assessed patient finances. Patient enrolled in [employer] patient assistance program for Trulicity & BASAGLAR until 06/15/21   Patient Goals/Self-Care Activities Over the next 90 days, patient will:  - take medications as prescribed check glucose DAILY (FASTING), document, and provide at future appointments  Follow Up Plan: Telephone follow up appointment with care management team member scheduled for: 1 MONTH  Medication Assistance:  [place] obtained through [employer] medication assistance program.  Enrollment ends 06/15/21  Patient's preferred pharmacy is:  [facility] [address] [place] - [address] [place] [address] Phone: [phone] Fax: [phone]  [facility] - [place], [place] [address] Phone: [phone] Fax: [phone]  [facility] [place], [place] - [address] [place] [address] Phone: [phone] Fax: [phone]   Follow Up:  Patient agrees to Care Plan and Follow-up.  Plan: Telephone follow up appointment with care management  team member scheduled for:  6 WEEKS  Regina Eck, PharmD, BCPS Clinical Pharmacist, [place]  II Phone [phone]

## 2021-03-14 NOTE — Progress Notes (Signed)
Received notification from [employer] regarding approval for TRULICITY 1.5MG . Patient assistance approved until 06/15/21.  2ND Medication basaglar also added to enrollment. Approved until 06/15/21.  Medication ships to patients home  Phone: [phone]

## 2021-03-15 DIAGNOSIS — E1149 Type 2 diabetes mellitus with other diabetic neurological complication: Secondary | ICD-10-CM | POA: Diagnosis not present

## 2021-03-19 ENCOUNTER — Telehealth: Payer: Self-pay | Admitting: Family Medicine

## 2021-03-20 ENCOUNTER — Telehealth: Payer: Medicare HMO

## 2021-03-20 NOTE — Telephone Encounter (Signed)
Patient was approved for his basaglar (insulin) on 03/14/21--shipment should be here soon  We will call him when it arrives If he needs samples--we can give him some insulin--just let me know

## 2021-03-20 NOTE — Telephone Encounter (Signed)
Pt returned missed call. Made pt aware that Basaglar was approved and shipment should be here soon and we will call him when shipment arrives. Pt voiced understanding.

## 2021-03-20 NOTE — Telephone Encounter (Signed)
Left message to call back  

## 2021-03-26 ENCOUNTER — Telehealth: Payer: Self-pay | Admitting: Family Medicine

## 2021-03-26 ENCOUNTER — Ambulatory Visit (INDEPENDENT_AMBULATORY_CARE_PROVIDER_SITE_OTHER): Payer: Medicare HMO | Admitting: Pharmacist

## 2021-03-26 DIAGNOSIS — E1165 Type 2 diabetes mellitus with hyperglycemia: Secondary | ICD-10-CM

## 2021-03-26 NOTE — Telephone Encounter (Signed)
Patient spoke with Patrick Moss

## 2021-03-26 NOTE — Telephone Encounter (Signed)
Basaglar 4 month supply in fridge  Patient to pick up No further needs

## 2021-03-26 NOTE — Telephone Encounter (Signed)
Pt calling to check on insulin pens. He said that we were supposed to order some for him but he had not heard back and wanted to check on them. Please call back

## 2021-03-30 NOTE — Progress Notes (Signed)
Chronic Care Management Pharmacy Note  03/26/2021 Name:  Patrick Moss MRN:  [id number] DOB:  1940-10-09  Summary: T2DM  Recommendations/Changes made from today's visit: Diabetes: Uncontrolled; current treatment: BASAGLAR 65 units DAILY; GLIPIZIDE, METFORMIN; TRULICITY 9.5AO SQ WEEKLY Denies personal and family history of Medullary thyroid cancer ([place]) Patient enrolled IN [place] until 13/08/65 for TRULICITY --MEDICATION TO SHIP TO PCP OFFICE DUE TO PHONE NOT ALWAYS WORKING INCREASED TRULICITY TO 7.8IO weekly [facility] [place] ON 9/16 SHIPMENT ARRIVED TO PCP OFFICE TODAY DECREASE TO BASAGLAR 65 UNITS DAILY (FREE [facility]IA [facility] PATIENT [facility]--CHANGED FROM [place]) E-[facility] [place] ON 9/16 SHIPMENT ARRIVED TO PCP OFFICE TODAY A1c 8.2% Current glucose readings: fasting glucose: <135, post prandial glucose: N/A Denies hypoglycemic/hyperglycemic symptoms Discussed meal planning options and Plate method for healthy eating Avoid sugary drinks and desserts Incorporate balanced protein, non starchy veggies, 1 serving of carbohydrate with each meal Increase water intake Increase physical activity as able Current exercise: N/A Educated on TRULICITY--MEDICATION PURPOSE & SIDE EFFECTS Recommended TRULICITY, DECREASE INSULIN TO 65-70 UNITS DAILY (DISCUSSED TREATMENT PLAN WITH DR. NIDA (ENDOCRINE); WORK TO DISCONTINUE GLIPIZIDE AS WE TITRATE GLP1 (AS NEEDED AND/OR TOLERATED) Assessed patient finances. Patient enrolled in [facility] patient [facility] program for Trulicity & BASAGLAR until 06/15/21  Follow Up Plan: Telephone follow up appointment with care management team member scheduled for: 1 MONTH   Subjective: [name] is an 80 y.o. year old male who is a primary patient of [name], [name], MD.  The CCM team was consulted for assistance with disease management and care coordination  needs.    Engaged with patient by telephone for follow up visit in response to provider referral for pharmacy case management and/or care coordination services.   Consent to Services:  The patient was given information about Chronic Care Management services, agreed to services, and gave verbal consent prior to initiation of services.  Please see initial visit note for detailed documentation.   Patient Care Team: [name], MD as PCP - General (Family Medicine) Cassandria Anger, MD as Consulting Physician (Endocrinology) Katy Fitch, Darlina Guys, MD as Consulting Physician (Ophthalmology) Lavera Guise, [facility] as Pharmacist (Family Medicine)  Objective:  Lab Results  Component Value Date   CREATININE 1.53 (H) 01/24/2021   CREATININE 1.36 (H) 10/24/2020   CREATININE 1.48 (H) 07/26/2020    Lab Results  Component Value Date   HGBA1C 7.9 (H) 01/24/2021   Last diabetic Eye exam:  Lab Results  Component Value Date/Time   HMDIABEYEEXA No Retinopathy 09/11/2020 12:00 AM    Last diabetic Foot exam: No results found for: HMDIABFOOTEX      Component Value Date/Time   CHOL 126 01/24/2021 0902   TRIG 158 (H) 01/24/2021 0902   HDL 37 (L) 01/24/2021 0902   CHOLHDL 3.4 01/24/2021 0902   LDLCALC 62 01/24/2021 0902    Hepatic Function Latest Ref Rng & Units 01/24/2021 10/24/2020 07/26/2020  Total Protein 6.0 - 8.5 g/dL 7.1 7.0 6.9  Albumin 3.7 - 4.7 g/dL 4.7 4.6 4.7  AST 0 - 40 IU/L 19 19 18   ALT 0 - 44 IU/L 24 24 25   Alk Phosphatase 44 - 121 IU/L 86 76 93  Total Bilirubin 0.0 - 1.2 mg/dL 0.6 0.5 0.4    Lab Results  Component Value Date/Time   TSH 3.610 12/02/2019 09:10 AM   TSH 3.450 08/29/2019 09:45 AM   FREET4 1.41 12/02/2019 09:10  AM   FREET4 1.27 08/29/2019 09:45 AM    CBC Latest Ref Rng & Units 01/24/2021 10/24/2020 07/26/2020  WBC 3.4 - 10.8 x10E3/uL 6.8 6.9 6.4  Hemoglobin 13.0 - 17.7 g/dL 14.9 15.2 15.1  Hematocrit 37.5 - 51.0 % 43.2 44.2 44.9  Platelets 150 - 450  x10E3/uL 246 164 203    Lab Results  Component Value Date/Time   VD25OH 40.4 07/26/2020 09:01 AM   VD25OH 19.4 (L) 03/29/2020 09:48 AM    Clinical ASCVD: No  The ASCVD Risk score (Arnett DK, et al., 2019) failed to calculate for the following reasons:   The valid total cholesterol range is 130 to 320 mg/dL    Other: (CHADS2VASc if Afib, PHQ9 if depression, MMRC or CAT for COPD, ACT, DEXA)  Social History   Tobacco Use  Smoking Status Never  Smokeless Tobacco Never   BP Readings from Last 3 Encounters:  02/19/21 138/72  01/24/21 131/66  11/15/20 138/62   Pulse Readings from Last 3 Encounters:  02/19/21 80  01/24/21 65  11/15/20 68   Wt Readings from Last 3 Encounters:  02/19/21 199 lb 6.4 oz (90.4 kg)  01/24/21 200 lb 6.4 oz (90.9 kg)  11/15/20 206 lb 6.4 oz (93.6 kg)    Assessment: Review of patient past medical history, allergies, medications, health status, including review of consultants reports, laboratory and other test data, was performed as part of comprehensive evaluation and provision of chronic care management services.   SDOH:  (Social Determinants of Health) assessments and interventions performed:    CCM Care Plan  Allergies  Allergen Reactions   Sulfamethoxazole-Trimethoprim Hives    Medications Reviewed Today     Reviewed by Lavera Guise, [facility] (Pharmacist) on 03/12/21 at 1054  Med List Status: <None>   Medication Order Taking? Sig Documenting Provider Last Dose Status Informant  ACCU-CHEK SMARTVIEW test strip [id number] No TEST BLOOD SUGAR TWO TO THREE TIMES DAILY AS DIRECTED Nida, Marella Chimes, MD Taking Active   amLODipine (NORVASC) 5 MG tablet [id number] No TAKE 1 TABLET (5 MG TOTAL) BY MOUTH DAILY. [name], MD Taking Active   aspirin 81 MG tablet [id number] No Take 81 mg by mouth daily. [provider] Taking Active   B-D ULTRAFINE III SHORT PEN 31G X 8 MM MISC [id number] No USE  1  PEN  NEEDLE AS DIRECTED Nida,  Marella Chimes, MD Taking Active   celecoxib (CELEBREX) 400 MG capsule [id number]  Take 1 capsule (400 mg total) by mouth daily. With food [name], MD  Active   Cholecalciferol (VITAMIN D3) 50 MCG (2000 UT) TABS [id number] No Take by mouth. [provider] Taking Active   Dulaglutide (TRULICITY) 1.5 HE/1.7EY SOPN [id number]  Inject 1.5 mg into the skin once a week. [name], MD  Active   Insulin Glargine [facility]) 100 UNIT/ML [id number]  Inject 70 Units into the skin daily. [name], MD  Active   metFORMIN (GLUCOPHAGE-XR) 500 MG 24 hr tablet [id number] No Take 1,000 mg by mouth daily after breakfast. [provider] Taking Active            Med Note Georgiann Cocker, AMY E   Tue Oct 02, 2020  8:39 AM) Taking one per day  rosuvastatin (CRESTOR) 5 MG tablet [id number] No Take 1 tablet (5 mg total) by mouth daily. [name], MD Taking Active   valsartan-hydrochlorothiazide (DIOVAN-HCT) 320-25 MG tablet [id number] No Take 1 tablet by mouth daily. For Blood Pressure [name], MD Taking Active  Patient Active Problem List   Diagnosis Date Noted   Uncontrolled type 2 diabetes mellitus with hyperglycemia ([place]) 04/07/2019   Intractable hiccups 09/10/2018   Mixed hyperlipidemia 01/19/2017   Class 1 obesity due to excess calories without serious comorbidity with body mass index (BMI) of 32.0 to 32.9 in adult 10/14/2016   Hypertension associated with diabetes ([place]) 03/12/2016   Uncontrolled type 2 diabetes mellitus without complication, without long-term current use of insulin 05/16/2015   Essential hypertension, benign 05/16/2015   Tinea 11/02/2013   Type 2 diabetes mellitus with neurological complications ([place]) 81/82/9937    Immunization History  Administered Date(s) Administered   Fluad Quad(high Dose 65+) 03/29/2020   Influenza, High Dose Seasonal PF 03/28/2015, 04/07/2016, 04/30/2016, 03/20/2017, 03/17/2018, 03/07/2019    Influenza,inj,Quad PF,6+ Mos 04/07/2016, 04/30/2016, 03/20/2017, 03/17/2018   Moderna Sars-Covid-2 Vaccination 07/06/2019, 08/03/2019, 05/02/2020   Pneumococcal Conjugate-13 06/30/2016   Pneumococcal Polysaccharide-23 07/15/2017   Tdap 12/27/2014   Zoster Recombinat (Shingrix) 01/24/2021    Conditions to be addressed/monitored: DMII  Care Plan : PHARMD MEDICATION MANAGEMENT  Updates made by Lavera Guise, [place] since 03/30/2021 12:00 AM     Problem: DISEASE PROGRESSION PREVENTION   Priority: High     Long-Range Goal: T2DM   Recent Progress: On track  Priority: High  Note:   Current Barriers:  Unable to independently afford treatment regimen Unable to achieve control of T2DM  Suboptimal therapeutic regimen for T2DM  Pharmacist Clinical Goal(s):  Over the next 90 days, patient will verbalize ability to afford treatment regimen achieve control of T2DM as evidenced by GOAL A1C<7%  through collaboration with PharmD and provider.    Interventions: 1:1 collaboration with [name], MD regarding development and update of comprehensive plan of care as evidenced by provider attestation and co-signature Inter-disciplinary care team collaboration (see longitudinal plan of care) Comprehensive medication review performed; medication list updated in electronic medical record  Diabetes: Uncontrolled; current treatment: BASAGLAR 65 units DAILY; GLIPIZIDE, METFORMIN; TRULICITY 1.6RC SQ WEEKLY Denies personal and family history of Medullary thyroid cancer ([place]) Patient enrolled IN [place] until 78/93/81 for TRULICITY --MEDICATION TO SHIP TO PCP OFFICE DUE TO PHONE NOT ALWAYS WORKING INCREASED TRULICITY TO 0.1BP weekly [facility] [place] ON 9/16 SHIPMENT ARRIVED TO PCP OFFICE TODAY DECREASE TO BASAGLAR 65 UNITS DAILY (FREE [facility]) E-SCRIBED TO [place] ON 9/16 SHIPMENT  ARRIVED TO PCP OFFICE TODAY A1c 8.2% Current glucose readings: fasting glucose: <135, post prandial glucose: N/A Denies hypoglycemic/hyperglycemic symptoms Discussed meal planning options and Plate method for healthy eating Avoid sugary drinks and desserts Incorporate balanced protein, non starchy veggies, 1 serving of carbohydrate with each meal Increase water intake Increase physical activity as able Current exercise: N/A Educated on TRULICITY--MEDICATION PURPOSE & SIDE EFFECTS Recommended TRULICITY, DECREASE INSULIN TO 65-70 UNITS DAILY (DISCUSSED TREATMENT PLAN WITH DR. NIDA (ENDOCRINE); WORK TO DISCONTINUE GLIPIZIDE AS WE TITRATE GLP1 (AS NEEDED AND/OR TOLERATED) Assessed patient finances. Patient enrolled in [facility] patient assistance program for Trulicity & BASAGLAR until 06/15/21   Patient Goals/Self-Care Activities Over the next 90 days, patient will:  - take medications as prescribed check glucose DAILY (FASTING), document, and provide at future appointments  Follow Up Plan: Telephone follow up appointment with care management team member scheduled for: 1 MONTH      Medication Assistance:  [place] obtained through [place] medication assistance program.  Enrollment ends 06/15/21  Patient's preferred pharmacy is:  [facility] [address] [place] - [place] [address] [place] [address] Phone: [phone] Fax: [phone]  [facility] - [place], [place] [address] Phone: [phone] Fax: [phone]  [facility] [place], [place] - [address] [place] [address] Phone: [phone] Fax: [phone]  Uses pill box? No - N/A Pt endorses 100% compliance  Follow Up:  Patient agrees to Care Plan and Follow-up.  Plan: Telephone follow up appointment with care management team member  scheduled for:  05/2021  Regina Eck, PharmD, BCPS Clinical Pharmacist, [place]  II Phone [phone]

## 2021-03-30 NOTE — Patient Instructions (Signed)
Visit Information  PATIENT GOALS:  Goals Addressed               This Visit's Progress     Patient Stated     T2DM-PHARMD (pt-stated)        Current Barriers:  Unable to independently afford treatment regimen Unable to achieve control of T2DM  Suboptimal therapeutic regimen for T2DM  Pharmacist Clinical Goal(s):  Over the next 90 days, patient will verbalize ability to afford treatment regimen achieve control of T2DM as evidenced by GOAL A1C<7% through collaboration with PharmD and provider.    Interventions: 1:1 collaboration with [name], MD regarding development and update of comprehensive plan of care as evidenced by provider attestation and co-signature Inter-disciplinary care team collaboration (see longitudinal plan of care) Comprehensive medication review performed; medication list updated in electronic medical record  Diabetes: Uncontrolled; current treatment: BASAGLAR 65 units DAILY; GLIPIZIDE, METFORMIN; TRULICITY 1.5MG  SQ WEEKLY Denies personal and family history of Medullary thyroid cancer ([place]) Patient enrolled IN [place] until 67/61/95 for TRULICITY --MEDICATION TO SHIP TO PCP OFFICE DUE TO PHONE NOT ALWAYS WORKING INCREASED TRULICITY TO 1.5mg  weekly [facility] [place] ON 9/16 SHIPMENT ARRIVED TO PCP OFFICE TODAY DECREASE TO BASAGLAR 65 UNITS DAILY (FREE [facility]) E-SCRIBED TO [place] ON 9/16 SHIPMENT ARRIVED TO PCP OFFICE TODAY A1c 8.2% Current glucose readings: fasting glucose: <135, post prandial glucose: N/A Denies hypoglycemic/hyperglycemic symptoms Discussed meal planning options and Plate method for healthy eating Avoid sugary drinks and desserts Incorporate balanced protein, non starchy veggies, 1 serving of carbohydrate with each meal Increase water intake Increase physical activity as able Current exercise: N/A Educated on  TRULICITY--MEDICATION PURPOSE & SIDE EFFECTS Recommended TRULICITY, DECREASE INSULIN TO 65-70 UNITS DAILY (DISCUSSED TREATMENT PLAN WITH DR. NIDA (ENDOCRINE); WORK TO DISCONTINUE GLIPIZIDE AS WE TITRATE GLP1 (AS NEEDED AND/OR TOLERATED) Assessed patient finances. Patient enrolled in [facility] patient assistance program for Trulicity & BASAGLAR until 06/15/21   Patient Goals/Self-Care Activities Over the next 90 days, patient will:  - take medications as prescribed check glucose DAILY (FASTING), document, and provide at future appointments  Follow Up Plan: Telephone follow up appointment with care management team member scheduled for: 1 MONTH         The patient verbalized understanding of instructions, educational materials, and care plan provided today and declined offer to receive copy of patient instructions, educational materials, and care plan.   Telephone follow up appointment with care management team member scheduled for:05/2021  Signature Regina Eck, PharmD, BCPS Clinical Pharmacist, [place]  II Phone [phone]

## 2021-04-15 DIAGNOSIS — E1165 Type 2 diabetes mellitus with hyperglycemia: Secondary | ICD-10-CM

## 2021-04-19 ENCOUNTER — Ambulatory Visit (INDEPENDENT_AMBULATORY_CARE_PROVIDER_SITE_OTHER): Payer: Medicare HMO | Admitting: Pharmacist

## 2021-04-19 DIAGNOSIS — E782 Mixed hyperlipidemia: Secondary | ICD-10-CM

## 2021-04-19 DIAGNOSIS — E1165 Type 2 diabetes mellitus with hyperglycemia: Secondary | ICD-10-CM

## 2021-04-29 ENCOUNTER — Other Ambulatory Visit: Payer: Self-pay

## 2021-04-29 ENCOUNTER — Encounter: Payer: Self-pay | Admitting: Family Medicine

## 2021-04-29 ENCOUNTER — Ambulatory Visit (INDEPENDENT_AMBULATORY_CARE_PROVIDER_SITE_OTHER): Payer: Medicare HMO | Admitting: Family Medicine

## 2021-04-29 VITALS — BP 135/72 | HR 68 | Temp 98.2°F | Ht 66.0 in | Wt 199.4 lb

## 2021-04-29 DIAGNOSIS — I152 Hypertension secondary to endocrine disorders: Secondary | ICD-10-CM

## 2021-04-29 DIAGNOSIS — E1165 Type 2 diabetes mellitus with hyperglycemia: Secondary | ICD-10-CM

## 2021-04-29 DIAGNOSIS — E782 Mixed hyperlipidemia: Secondary | ICD-10-CM | POA: Diagnosis not present

## 2021-04-29 DIAGNOSIS — E1159 Type 2 diabetes mellitus with other circulatory complications: Secondary | ICD-10-CM | POA: Diagnosis not present

## 2021-04-29 DIAGNOSIS — Z125 Encounter for screening for malignant neoplasm of prostate: Secondary | ICD-10-CM | POA: Diagnosis not present

## 2021-04-29 DIAGNOSIS — I10 Essential (primary) hypertension: Secondary | ICD-10-CM | POA: Diagnosis not present

## 2021-04-29 LAB — BAYER DCA HB A1C WAIVED: HB A1C (BAYER DCA - WAIVED): 6.8 % — ABNORMAL HIGH (ref 4.8–5.6)

## 2021-04-29 MED ORDER — AMLODIPINE BESYLATE 5 MG PO TABS: 5.0000 mg | ORAL_TABLET | Freq: Every day | ORAL | 3 refills | Status: DC

## 2021-04-29 NOTE — Progress Notes (Signed)
Subjective:  Patient ID: Patrick Moss,  male    DOB: November 21, 1940  Age: 80 y.o.    CC: Medical Management of Chronic Issues   HPI Patrick Moss presents for  follow-up of hypertension. Patient has no history of headache chest pain or shortness of breath or recent cough. Patient also denies symptoms of TIA such as numbness weakness lateralizing. Patient denies side effects from medication. States taking it regularly.  Patient also  in for follow-up of elevated cholesterol. Doing well without complaints on current medication. Denies side effects  including myalgia and arthralgia and nausea. Also in today for liver function testing. Currently no chest pain, shortness of breath or other cardiovascular related symptoms noted.  Follow-up of diabetes. Patient does check blood sugar at home. Readings run between 98-120 fasting  and 130-140 prandial.  Patient denies symptoms such as excessive hunger or urinary frequency, excessive hunger, nausea No significant hypoglycemic spells noted. Medications reviewed. Pt reports taking them regularly. Pt. denies complication/adverse reaction today.   Feeling good. Very little back pain. Still a little left shoulder pain. Injured 8 months ago.  History Patrick Moss has a past medical history of Diabetes mellitus without complication ([place]), Hyperlipidemia, and Hypertension.   He has a past surgical history that includes R foot fx.   His family history includes Diabetes in his brother, father, and mother.He reports that he has never smoked. He has never used smokeless tobacco. He reports that he does not drink alcohol and does not use drugs.  Current Outpatient Medications on File Prior to Visit  Medication Sig Dispense Refill   ACCU-CHEK SMARTVIEW test strip TEST BLOOD SUGAR TWO TO THREE TIMES DAILY AS DIRECTED 300 each 1   amLODipine (NORVASC) 5 MG tablet TAKE 1 TABLET (5 MG TOTAL) BY MOUTH DAILY. 90 tablet 1   aspirin 81 MG tablet Take 81 mg by mouth  daily.     B-D ULTRAFINE III SHORT PEN 31G X 8 MM MISC USE  1  PEN  NEEDLE AS DIRECTED 270 each 3   celecoxib (CELEBREX) 400 MG capsule Take 1 capsule (400 mg total) by mouth daily. With food 90 capsule 3   Cholecalciferol (VITAMIN D3) 50 MCG (2000 UT) TABS Take by mouth.     Dulaglutide (TRULICITY) 1.5 QI/3.4VQ SOPN Inject 1.5 mg into the skin once a week. 6 mL 4   Insulin Glargine (BASAGLAR KWIKPEN) 100 UNIT/ML Inject 70 Units into the skin daily. 45 mL 4   metFORMIN (GLUCOPHAGE-XR) 500 MG 24 hr tablet Take 1,000 mg by mouth daily after breakfast.     rosuvastatin (CRESTOR) 5 MG tablet Take 1 tablet (5 mg total) by mouth daily. 90 tablet 3   valsartan-hydrochlorothiazide (DIOVAN-HCT) 320-25 MG tablet Take 1 tablet by mouth daily. For Blood Pressure 90 tablet 3   No current facility-administered medications on file prior to visit.    ROS Review of Systems  Objective:  BP 135/72   Pulse 68   Temp 98.2 F (36.8 C)   Ht 5' 6"  (1.676 m)   Wt 199 lb 6.4 oz (90.4 kg)   SpO2 98%   BMI 32.18 kg/m   BP Readings from Last 3 Encounters:  04/29/21 135/72  02/19/21 138/72  01/24/21 131/66    Wt Readings from Last 3 Encounters:  04/29/21 199 lb 6.4 oz (90.4 kg)  02/19/21 199 lb 6.4 oz (90.4 kg)  01/24/21 200 lb 6.4 oz (90.9 kg)     Physical Exam  Diabetic  Foot Exam - Simple   No data filed       Assessment & Plan:   Patrick Moss was seen today for medical management of chronic issues.  Diagnoses and all orders for this visit:  Uncontrolled type 2 diabetes mellitus with hyperglycemia ([place]) -     Bayer DCA Hb A1c Waived -     CBC with Differential/Platelet  Mixed hyperlipidemia -     Lipid panel  Essential hypertension, benign -     CMP14+EGFR  Prostate cancer screening -     PSA, total and free  Hypertension associated with diabetes ([place])  I am having Patrick Moss maintain his aspirin, metFORMIN, B-D ULTRAFINE III SHORT PEN, Accu-Chek SmartView,  valsartan-hydrochlorothiazide, rosuvastatin, Vitamin D3, amLODipine, celecoxib, Basaglar KwikPen, and Trulicity.  No orders of the defined types were placed in this encounter.    Follow-up: Return in about 3 months (around 07/30/2021).  Patrick Moss, M.D.

## 2021-04-30 LAB — CMP14+EGFR
ALT: 23 IU/L (ref 0–44)
AST: 16 IU/L (ref 0–40)
Albumin/Globulin Ratio: 2 (ref 1.2–2.2)
Albumin: 4.7 g/dL (ref 3.7–4.7)
Alkaline Phosphatase: 84 IU/L (ref 44–121)
BUN/Creatinine Ratio: 13 (ref 10–24)
BUN: 18 mg/dL (ref 8–27)
Bilirubin Total: 0.4 mg/dL (ref 0.0–1.2)
CO2: 25 mmol/L (ref 20–29)
Calcium: 10.3 mg/dL — ABNORMAL HIGH (ref 8.6–10.2)
Chloride: 100 mmol/L (ref 96–106)
Creatinine, Ser: 1.43 mg/dL — ABNORMAL HIGH (ref 0.76–1.27)
Globulin, Total: 2.3 g/dL (ref 1.5–4.5)
Glucose: 148 mg/dL — ABNORMAL HIGH (ref 70–99)
Potassium: 5.3 mmol/L — ABNORMAL HIGH (ref 3.5–5.2)
Sodium: 140 mmol/L (ref 134–144)
Total Protein: 7 g/dL (ref 6.0–8.5)
eGFR: 50 mL/min/{1.73_m2} — ABNORMAL LOW (ref >59)

## 2021-04-30 LAB — CBC WITH DIFFERENTIAL/PLATELET
Basophils Absolute: 0 10*3/uL (ref 0.0–0.2)
Basos: 1 %
EOS (ABSOLUTE): 0.1 10*3/uL (ref 0.0–0.4)
Eos: 1 %
Hematocrit: 46.6 % (ref 37.5–51.0)
Hemoglobin: 15.9 g/dL (ref 13.0–17.7)
Immature Grans (Abs): 0 10*3/uL (ref 0.0–0.1)
Immature Granulocytes: 0 %
Lymphocytes Absolute: 2.5 10*3/uL (ref 0.7–3.1)
Lymphs: 34 %
MCH: 30.1 pg (ref 26.6–33.0)
MCHC: 34.1 g/dL (ref 31.5–35.7)
MCV: 88 fL (ref 79–97)
Monocytes Absolute: 0.5 10*3/uL (ref 0.1–0.9)
Monocytes: 6 %
Neutrophils Absolute: 4.2 10*3/uL (ref 1.4–7.0)
Neutrophils: 58 %
Platelets: 140 10*3/uL — ABNORMAL LOW (ref 150–450)
RBC: 5.28 x10E6/uL (ref 4.14–5.80)
RDW: 12.5 % (ref 11.6–15.4)
WBC: 7.3 10*3/uL (ref 3.4–10.8)

## 2021-04-30 LAB — LIPID PANEL
Chol/HDL Ratio: 3.2 ratio (ref 0.0–5.0)
Cholesterol, Total: 120 mg/dL (ref 100–199)
HDL: 38 mg/dL — ABNORMAL LOW (ref >39)
LDL Chol Calc (NIH): 55 mg/dL (ref 0–99)
Triglycerides: 161 mg/dL — ABNORMAL HIGH (ref 0–149)
VLDL Cholesterol Cal: 27 mg/dL (ref 5–40)

## 2021-04-30 LAB — PSA, TOTAL AND FREE
PSA, Free Pct: 17.5 %
PSA, Free: 1.98 ng/mL
Prostate Specific Ag, Serum: 11.3 ng/mL — ABNORMAL HIGH (ref 0.0–4.0)

## 2021-05-01 ENCOUNTER — Other Ambulatory Visit: Payer: Self-pay | Admitting: Family Medicine

## 2021-05-01 ENCOUNTER — Telehealth: Payer: Self-pay | Admitting: Family Medicine

## 2021-05-01 DIAGNOSIS — R972 Elevated prostate specific antigen [PSA]: Secondary | ICD-10-CM

## 2021-05-01 NOTE — Patient Instructions (Addendum)
Visit Information  Current Barriers:  Unable to independently afford treatment regimen Unable to achieve control of T2DM  Suboptimal therapeutic regimen for T2DM  Pharmacist Clinical Goal(s):  Over the next 90 days, patient will verbalize ability to afford treatment regimen achieve control of T2DM as evidenced by GOAL A1C<7% through collaboration with PharmD and provider.    Interventions: 1:1 collaboration with [name], MD regarding development and update of comprehensive plan of care as evidenced by provider attestation and co-signature Inter-disciplinary care team collaboration (see longitudinal plan of care) Comprehensive medication review performed; medication list updated in electronic medical record  Diabetes: Uncontrolled (improving); current treatment: BASAGLAR 65 units DAILY; GLIPIZIDE, METFORMIN; TRULICITY 1.5MG  SQ WEEKLY Denies personal and family history of Medullary thyroid cancer ([place]) Patient enrolled IN [place] until 74/82/70 for TRULICITY --MEDICATION TO SHIP TO PCP OFFICE DUE TO PHONE NOT ALWAYS WORKING CONTINUE TRULICITY TO 1.5mg  weekly [facility] [place] ON 9/16 SHIPMENT ARRIVED TO PCP OFFICE LAST MONTH CONTINUE BASAGLAR 65 UNITS DAILY ([facility] PATIENT [facility]) E-[facility]IBED TO [facility] [place] ON 9/16 SHIPMENT ARRIVED TO PCP OFFICE LAST MONTH A1c 8.2% Current glucose readings: fasting glucose: <135, post prandial glucose: N/A Denies hypoglycemic/hyperglycemic symptoms Discussed meal planning options and Plate method for healthy eating Avoid sugary drinks and desserts Incorporate balanced protein, non starchy veggies, 1 serving of carbohydrate with each meal Increase water intake Increase physical activity as able Current exercise: N/A Educated on TRULICITY--MEDICATION PURPOSE & SIDE EFFECTS Recommended TRULICITY, DECREASE INSULIN TO 65-70 UNITS DAILY (DISCUSSED  TREATMENT PLAN WITH DR. NIDA (ENDOCRINE); WORK TO DISCONTINUE GLIPIZIDE AS WE TITRATE GLP1 (AS NEEDED AND/OR TOLERATED) Assessed patient finances. Patient enrolled in [facility] patient assistance program for Trulicity & BASAGLAR until 06/15/21  HYPERLIPIDEMIA Discussed statin and compliance--patient at goal  Lipid Panel     Component Value Date/Time   CHOL 120 04/29/2021 0906   TRIG 161 (H) 04/29/2021 0906   HDL 38 (L) 04/29/2021 0906   CHOLHDL 3.2 04/29/2021 0906   LDLCALC 55 04/29/2021 0906   LABVLDL 27 04/29/2021 0906     Patient Goals/Self-Care Activities Over the next 90 days, patient will:  - take medications as prescribed check glucose DAILY (FASTING), document, and provide at future appointments  Follow Up Plan: Telephone follow up appointment with care management team member scheduled for: 1 MONTH   The patient verbalized understanding of instructions, educational materials, and care plan provided today and declined offer to receive copy of patient instructions, educational materials, and care plan.   Telephone follow up appointment with care management team member scheduled for: 05/2021  Signature Regina Eck, PharmD, BCPS Clinical Pharmacist, [place]  II Phone [phone]

## 2021-05-01 NOTE — Progress Notes (Signed)
Chronic Care Management Pharmacy Note  04/19/2021 Name:  Patrick Moss MRN:  [id number] DOB:  [birth year]  Summary: T2DM, HLD  Recommendations/Changes made from today's visit: Current Barriers:  Unable to independently afford treatment regimen Unable to achieve control of T2DM  Suboptimal therapeutic regimen for T2DM  Pharmacist Clinical Goal(s):  Over the next 90 days, patient will verbalize ability to afford treatment regimen achieve control of T2DM as evidenced by GOAL A1C<7% through collaboration with PharmD and provider.    Interventions: 1:1 collaboration with [name], MD regarding development and update of comprehensive plan of care as evidenced by provider attestation and co-signature Inter-disciplinary care team collaboration (see longitudinal plan of care) Comprehensive medication review performed; medication list updated in electronic medical record  Diabetes: Uncontrolled (improving); current treatment: BASAGLAR 65 units DAILY; GLIPIZIDE, METFORMIN; TRULICITY 0.2DX SQ WEEKLY Denies personal and family history of Medullary thyroid cancer ([place]) Patient enrolled IN [place] until 41/28/78 for TRULICITY --MEDICATION TO SHIP TO PCP OFFICE DUE TO PHONE NOT ALWAYS WORKING CONTINUE TRULICITY TO 6.7EH weekly [facility] CROSSROADS ON 9/16 SHIPMENT ARRIVED TO PCP OFFICE LAST MONTH CONTINUE BASAGLAR 65 UNITS DAILY (FREE [facility]IA [facility]) E-SCRIBED TO [place] ON 9/16 SHIPMENT ARRIVED TO PCP OFFICE LAST MONTH A1c 8.2% Current glucose readings: fasting glucose: <135, post prandial glucose: N/A Denies hypoglycemic/hyperglycemic symptoms Discussed meal planning options and Plate method for healthy eating Avoid sugary drinks and desserts Incorporate balanced protein, non starchy veggies, 1 serving of carbohydrate with each meal Increase water intake Increase physical  activity as able Current exercise: N/A Educated on TRULICITY--MEDICATION PURPOSE & SIDE EFFECTS Recommended TRULICITY, DECREASE INSULIN TO 65-70 UNITS DAILY (DISCUSSED TREATMENT PLAN WITH DR. NIDA (ENDOCRINE); WORK TO DISCONTINUE GLIPIZIDE AS WE TITRATE GLP1 (AS NEEDED AND/OR TOLERATED) Assessed patient finances. Patient enrolled in [facility] patient assistance program for Trulicity & BASAGLAR until 06/15/21  HYPERLIPIDEMIA Discussed statin and compliance--patient at goal  Lipid Panel     Component Value Date/Time   CHOL 120 04/29/2021 0906   TRIG 161 (H) 04/29/2021 0906   HDL 38 (L) 04/29/2021 0906   CHOLHDL 3.2 04/29/2021 0906   LDLCALC 55 04/29/2021 0906   LABVLDL 27 04/29/2021 0906   Patient Goals/Self-Care Activities Over the next 90 days, patient will:  - take medications as prescribed check glucose DAILY (FASTING), document, and provide at future appointments  Follow Up Plan: Telephone follow up appointment with care management team member scheduled for: 1 MONTH   Subjective: Patrick Moss is an 80 y.o. year old male who is a primary patient of [name], [name], MD.  The CCM team was consulted for assistance with disease management and care coordination needs.    Engaged with patient by telephone for follow up visit in response to provider referral for pharmacy case management and/or care coordination services.   Consent to Services:  The patient was given information about Chronic Care Management services, agreed to services, and gave verbal consent prior to initiation of services.  Please see initial visit note for detailed documentation.   Patient Care Team: [name], MD as PCP - General (Family Medicine) Cassandria Anger, MD as Consulting Physician (Endocrinology) Katy Fitch, Darlina Guys, MD as Consulting Physician (Ophthalmology) Lavera Guise, [facility] as Pharmacist (Family Medicine)  Objective:  Lab Results  Component Value Date   CREATININE 1.43 (H)  04/29/2021   CREATININE 1.53 (H) 01/24/2021   CREATININE 1.36 (H) 10/24/2020  Lab Results  Component Value Date   HGBA1C 6.8 (H) 04/29/2021   Last diabetic Eye exam:  Lab Results  Component Value Date/Time   HMDIABEYEEXA No Retinopathy 09/11/2020 12:00 AM    Last diabetic Foot exam: No results found for: HMDIABFOOTEX      Component Value Date/Time   CHOL 120 04/29/2021 0906   TRIG 161 (H) 04/29/2021 0906   HDL 38 (L) 04/29/2021 0906   CHOLHDL 3.2 04/29/2021 0906   LDLCALC 55 04/29/2021 0906    Hepatic Function Latest Ref Rng & Units 04/29/2021 01/24/2021 10/24/2020  Total Protein 6.0 - 8.5 g/dL 7.0 7.1 7.0  Albumin 3.7 - 4.7 g/dL 4.7 4.7 4.6  AST 0 - 40 IU/L _0 ALT 0 - 44 IU/L _1 Alk Phosphatase 44 - 121 IU/L 84 86 76  Total Bilirubin 0.0 - 1.2 mg/dL 0.4 0.6 0.5    Lab Results  Component Value Date/Time   TSH 3.610 12/02/2019 09:10 AM   TSH 3.450 08/29/2019 09:45 AM   FREET4 1.41 12/02/2019 09:10 AM   FREET4 1.27 08/29/2019 09:45 AM    CBC Latest Ref Rng & Units 04/29/2021 01/24/2021 10/24/2020  WBC 3.4 - 10.8 x10E3/uL 7.3 6.8 6.9  Hemoglobin 13.0 - 17.7 g/dL 15.9 14.9 15.2  Hematocrit 37.5 - 51.0 % 46.6 43.2 44.2  Platelets 150 - 450 x10E3/uL 140(L) 246 164    Lab Results  Component Value Date/Time   VD25OH 40.4 07/26/2020 09:01 AM   VD25OH 19.4 (L) 03/29/2020 09:48 AM    Clinical ASCVD: No  The ASCVD Risk score (Arnett DK, et al., 2019) failed to calculate for the following reasons:   The valid total cholesterol range is 130 to 320 mg/dL    Other: (CHADS2VASc if Afib, PHQ9 if depression, MMRC or CAT for COPD, ACT, DEXA)  Social History   Tobacco Use  Smoking Status Never  Smokeless Tobacco Never   BP Readings from Last 3 Encounters:  04/29/21 135/72  02/19/21 138/72  01/24/21 131/66   Pulse Readings from Last 3 Encounters:  04/29/21 68  02/19/21 80  01/24/21 65   Wt Readings from Last 3 Encounters:  04/29/21 199 lb 6.4 oz  (90.4 kg)  02/19/21 199 lb 6.4 oz (90.4 kg)  01/24/21 200 lb 6.4 oz (90.9 kg)    Assessment: Review of patient past medical history, allergies, medications, health status, including review of consultants reports, laboratory and other test data, was performed as part of comprehensive evaluation and provision of chronic care management services.   SDOH:  (Social Determinants of Health) assessments and interventions performed:    CCM Care Plan  Allergies  Allergen Reactions   Sulfamethoxazole-Trimethoprim Hives    Medications Reviewed Today     Reviewed by [name], MD (Physician) on 04/29/21 at [place] List Status: <None>   Medication Order Taking? Sig Documenting Provider Last Dose Status Informant  ACCU-CHEK SMARTVIEW test strip [id number] Yes TEST BLOOD SUGAR TWO TO THREE TIMES DAILY AS DIRECTED Cassandria Anger, MD Taking Active   amLODipine (NORVASC) 5 MG tablet [id number] Yes TAKE 1 TABLET (5 MG TOTAL) BY MOUTH DAILY. [name], MD Taking Active   aspirin 81 MG tablet [id number] Yes Take 81 mg by mouth daily. [provider] Taking Active   B-D ULTRAFINE III SHORT PEN 31G X 8 MM MISC [id number] Yes USE  1  PEN  NEEDLE AS DIRECTED Nida, Marella Chimes, MD Taking Active   celecoxib (CELEBREX) 400 MG  capsule [id number] Yes Take 1 capsule (400 mg total) by mouth daily. With food [name], MD Taking Active   Cholecalciferol (VITAMIN D3) 50 MCG (2000 UT) TABS [id number] Yes Take by mouth. [provider] Taking Active   Dulaglutide (TRULICITY) 1.5 WY/6.3ZC SOPN [id number] Yes Inject 1.5 mg into the skin once a week. [name], MD Taking Active   Insulin Glargine [facility]) 100 UNIT/ML [id number] Yes Inject 70 Units into the skin daily. [name], MD Taking Active   metFORMIN (GLUCOPHAGE-XR) 500 MG 24 hr tablet [id number] Yes Take 1,000 mg by mouth daily after breakfast. [provider] Taking Active            Med Note  Georgiann Cocker, AMY E   Tue Oct 02, 2020  8:39 AM) Taking one per day  rosuvastatin (CRESTOR) 5 MG tablet [id number] Yes Take 1 tablet (5 mg total) by mouth daily. [name], MD Taking Active   valsartan-hydrochlorothiazide (DIOVAN-HCT) 320-25 MG tablet [id number] Yes Take 1 tablet by mouth daily. For Blood Pressure [name], MD Taking Active             Patient Active Problem List   Diagnosis Date Noted   Uncontrolled type 2 diabetes mellitus with hyperglycemia ([place]) 04/07/2019   Intractable hiccups 09/10/2018   Mixed hyperlipidemia 01/19/2017   Class 1 obesity due to excess calories without serious comorbidity with body mass index (BMI) of 32.0 to 32.9 in adult 10/14/2016   Hypertension associated with diabetes ([place]) 03/12/2016   Uncontrolled type 2 diabetes mellitus without complication, without long-term current use of insulin 05/16/2015   Essential hypertension, benign 05/16/2015   Tinea 11/02/2013   Type 2 diabetes mellitus with neurological complications ([place]) 56/81/2751    Immunization History  Administered Date(s) Administered   Fluad Quad(high Dose 65+) 03/29/2020   Influenza, High Dose Seasonal PF 03/28/2015, 04/07/2016, 04/30/2016, 03/20/2017, 03/17/2018, 03/07/2019   Influenza,inj,Quad PF,6+ Mos 04/07/2016, 04/30/2016, 03/20/2017, 03/17/2018   Moderna Sars-Covid-2 Vaccination 07/06/2019, 08/03/2019, 05/02/2020   Pneumococcal Conjugate-13 06/30/2016   Pneumococcal Polysaccharide-23 07/15/2017   Tdap 12/27/2014   Zoster Recombinat (Shingrix) 01/24/2021    Conditions to be addressed/monitored: HLD and DMII  Care Plan : PHARMD MEDICATION MANAGEMENT  Updates made by Lavera Guise, [place] since 05/07/2021 12:00 AM     Problem: DISEASE PROGRESSION PREVENTION   Priority: High     Long-Range Goal: T2DM   Recent Progress: On track  Priority: High  Note:   Current Barriers:  Unable to independently afford treatment regimen Unable to achieve control of T2DM   Suboptimal therapeutic regimen for T2DM  Pharmacist Clinical Goal(s):  Over the next 90 days, patient will verbalize ability to afford treatment regimen achieve control of T2DM as evidenced by GOAL A1C<7%  through collaboration with PharmD and provider.    Interventions: 1:1 collaboration with [name], MD regarding development and update of comprehensive plan of care as evidenced by provider attestation and co-signature Inter-disciplinary care team collaboration (see longitudinal plan of care) Comprehensive medication review performed; medication list updated in electronic medical record  Diabetes: Uncontrolled (improving); current treatment: BASAGLAR 65 units DAILY; GLIPIZIDE, METFORMIN; TRULICITY 7.0YF SQ WEEKLY Denies personal and family history of Medullary thyroid cancer ([place]) Patient enrolled IN [place] until [birth year] for TRULICITY --MEDICATION TO SHIP TO PCP OFFICE DUE TO PHONE NOT ALWAYS WORKING CONTINUE TRULICITY TO 6.7RF weekly E-SCRIBED TO [place] ON 9/16 SHIPMENT ARRIVED TO PCP OFFICE LAST MONTH CONTINUE BASAGLAR 65 UNITS DAILY (FREE  [facility]) [facility] RX CROSSROADS ON 9/16 SHIPMENT ARRIVED TO PCP OFFICE LAST MONTH A1c 8.2% Current glucose readings: fasting glucose: <135, post prandial glucose: N/A Denies hypoglycemic/hyperglycemic symptoms Discussed meal planning options and Plate method for healthy eating Avoid sugary drinks and desserts Incorporate balanced protein, non starchy veggies, 1 serving of carbohydrate with each meal Increase water intake Increase physical activity as able Current exercise: N/A Educated on TRULICITY--MEDICATION PURPOSE & SIDE EFFECTS Recommended TRULICITY, DECREASE INSULIN TO 65-70 UNITS DAILY (DISCUSSED TREATMENT PLAN WITH DR. NIDA (ENDOCRINE); WORK TO DISCONTINUE GLIPIZIDE AS WE TITRATE GLP1 (AS NEEDED AND/OR  TOLERATED) Assessed patient finances. Patient enrolled in [facility] patient assistance program for Trulicity & BASAGLAR until 06/15/21  HYPERLIPIDEMIA Discussed statin and compliance--patient at goal  Lipid Panel     Component Value Date/Time   CHOL 120 04/29/2021 0906   TRIG 161 (H) 04/29/2021 0906   HDL 38 (L) 04/29/2021 0906   CHOLHDL 3.2 04/29/2021 0906   LDLCALC 55 04/29/2021 0906   LABVLDL 27 04/29/2021 0906    Patient Goals/Self-Care Activities Over the next 90 days, patient will:  - take medications as prescribed check glucose DAILY (FASTING), document, and provide at future appointments  Follow Up Plan: Telephone follow up appointment with care management team member scheduled for: 1 MONTH      Medication Assistance:  [place]  Patient's preferred pharmacy is:  [facility] [address], [place] - [address] [place] [address] [address] Phone: [phone] Fax: [phone]  [facility] [place], [place] [address] Phone: [phone] Fax: [phone]  [facility] [place], [place] - [address] [place] [address] Phone: [phone] Fax: [phone]   Follow Up:  Patient agrees to Care Plan and Follow-up.  Plan: Telephone follow up appointment with care management team member scheduled for:  05/2021   Regina Eck, PharmD, BCPS Clinical Pharmacist, [place]  II Phone [phone]

## 2021-05-02 ENCOUNTER — Other Ambulatory Visit: Payer: Self-pay | Admitting: Family Medicine

## 2021-05-02 DIAGNOSIS — I444 Left anterior fascicular block: Secondary | ICD-10-CM | POA: Diagnosis not present

## 2021-05-02 DIAGNOSIS — M25551 Pain in right hip: Secondary | ICD-10-CM | POA: Diagnosis not present

## 2021-05-02 DIAGNOSIS — N189 Chronic kidney disease, unspecified: Secondary | ICD-10-CM | POA: Diagnosis not present

## 2021-05-02 DIAGNOSIS — E785 Hyperlipidemia, unspecified: Secondary | ICD-10-CM | POA: Diagnosis not present

## 2021-05-02 DIAGNOSIS — S7221XA Displaced subtrochanteric fracture of right femur, initial encounter for closed fracture: Secondary | ICD-10-CM | POA: Diagnosis not present

## 2021-05-02 DIAGNOSIS — N1832 Chronic kidney disease, stage 3b: Secondary | ICD-10-CM | POA: Diagnosis not present

## 2021-05-02 DIAGNOSIS — E1122 Type 2 diabetes mellitus with diabetic chronic kidney disease: Secondary | ICD-10-CM | POA: Diagnosis not present

## 2021-05-02 DIAGNOSIS — R972 Elevated prostate specific antigen [PSA]: Secondary | ICD-10-CM

## 2021-05-02 DIAGNOSIS — W1839XA Other fall on same level, initial encounter: Secondary | ICD-10-CM | POA: Diagnosis not present

## 2021-05-02 DIAGNOSIS — I129 Hypertensive chronic kidney disease with stage 1 through stage 4 chronic kidney disease, or unspecified chronic kidney disease: Secondary | ICD-10-CM | POA: Diagnosis not present

## 2021-05-02 DIAGNOSIS — S72001A Fracture of unspecified part of neck of right femur, initial encounter for closed fracture: Secondary | ICD-10-CM | POA: Insufficient documentation

## 2021-05-02 DIAGNOSIS — Z794 Long term (current) use of insulin: Secondary | ICD-10-CM | POA: Diagnosis not present

## 2021-05-02 DIAGNOSIS — S72101A Unspecified trochanteric fracture of right femur, initial encounter for closed fracture: Secondary | ICD-10-CM | POA: Diagnosis not present

## 2021-05-02 DIAGNOSIS — M84651A Pathological fracture in other disease, right femur, initial encounter for fracture: Secondary | ICD-10-CM | POA: Diagnosis not present

## 2021-05-02 DIAGNOSIS — E1169 Type 2 diabetes mellitus with other specified complication: Secondary | ICD-10-CM | POA: Diagnosis not present

## 2021-05-02 DIAGNOSIS — S72141A Displaced intertrochanteric fracture of right femur, initial encounter for closed fracture: Secondary | ICD-10-CM | POA: Diagnosis not present

## 2021-05-02 DIAGNOSIS — Z20822 Contact with and (suspected) exposure to covid-19: Secondary | ICD-10-CM | POA: Diagnosis not present

## 2021-05-02 DIAGNOSIS — E119 Type 2 diabetes mellitus without complications: Secondary | ICD-10-CM | POA: Diagnosis not present

## 2021-05-02 DIAGNOSIS — D62 Acute posthemorrhagic anemia: Secondary | ICD-10-CM | POA: Diagnosis not present

## 2021-05-02 DIAGNOSIS — M85851 Other specified disorders of bone density and structure, right thigh: Secondary | ICD-10-CM | POA: Diagnosis not present

## 2021-05-02 DIAGNOSIS — S72091A Other fracture of head and neck of right femur, initial encounter for closed fracture: Secondary | ICD-10-CM | POA: Diagnosis not present

## 2021-05-02 DIAGNOSIS — Z049 Encounter for examination and observation for unspecified reason: Secondary | ICD-10-CM | POA: Diagnosis not present

## 2021-05-02 DIAGNOSIS — R531 Weakness: Secondary | ICD-10-CM | POA: Diagnosis not present

## 2021-05-03 HISTORY — PX: FEMUR IM NAIL: SHX1597

## 2021-05-07 ENCOUNTER — Encounter: Payer: Self-pay | Admitting: *Deleted

## 2021-05-08 ENCOUNTER — Telehealth: Payer: Self-pay

## 2021-05-08 ENCOUNTER — Telehealth: Payer: Self-pay | Admitting: Family Medicine

## 2021-05-08 ENCOUNTER — Other Ambulatory Visit: Payer: Self-pay | Admitting: Family Medicine

## 2021-05-08 MED ORDER — CHLORPROMAZINE HCL 10 MG PO TABS: 10.0000 mg | ORAL_TABLET | Freq: Three times a day (TID) | ORAL | 0 refills | Status: DC

## 2021-05-08 NOTE — Telephone Encounter (Signed)
Transition Care Management Follow-up Telephone Call Date of discharge and from where: 05/06/21 [facility] - right hip fracture How have you been since you were released from the hospital? Getting around better - has hiccups x 6 days from anesthesia - informed pt Dr Livia Snellen sent in rx earlier to help Any questions or concerns? No  Items Reviewed: Did the pt receive and understand the discharge instructions provided? Yes  Medications obtained and verified? Yes  Other? No  Any new allergies since your discharge? No  Dietary orders reviewed? Yes Do you have support at home? Yes   Home Care and Equipment/Supplies: Were home health services ordered? yes If so, what is the name of the agency? [facility]  Has the agency set up a time to come to the patient's home? yes Were any new equipment or medical supplies ordered?  No What is the name of the medical supply agency? N/a Were you able to get the supplies/equipment? not applicable Do you have any questions related to the use of the equipment or supplies? No  Functional Questionnaire: (I = Independent and D = Dependent) ADLs: I - needs assistance with most things  Bathing/Dressing- I  Meal Prep- I  Eating- I  Maintaining continence- I  Transferring/Ambulation- I  Managing Meds- I  Follow up appointments reviewed:  PCP Hospital f/u appt confirmed? Yes  Scheduled to see [name] on 11/30 @ 9. [place] [facility] f/u appt confirmed? Yes  Scheduled to see Nida on 05/21/21 & waiting for call back from ortho for f/u appt Are transportation arrangements needed? No  If their condition worsens, is the pt aware to call PCP or go to the Emergency Dept.? Yes Was the patient provided with contact information for the PCP's office or ED? Yes Was to pt encouraged to call back with questions or concerns? Yes

## 2021-05-08 NOTE — Telephone Encounter (Signed)
[facility] [employer], pt's brother came in because pt is having hiccups and has been since he had surgery last Friday. They were told that it is because he was allergic to the anesthesia and they are wanting to know if there is anything that he can do or take that may help with this. Please call back and advise.

## 2021-05-15 ENCOUNTER — Encounter: Payer: Self-pay | Admitting: Family Medicine

## 2021-05-15 ENCOUNTER — Ambulatory Visit (INDEPENDENT_AMBULATORY_CARE_PROVIDER_SITE_OTHER): Payer: Medicare HMO | Admitting: Family Medicine

## 2021-05-15 VITALS — BP 125/56 | HR 92 | Temp 98.4°F

## 2021-05-15 DIAGNOSIS — E1159 Type 2 diabetes mellitus with other circulatory complications: Secondary | ICD-10-CM

## 2021-05-15 DIAGNOSIS — E782 Mixed hyperlipidemia: Secondary | ICD-10-CM | POA: Diagnosis not present

## 2021-05-15 DIAGNOSIS — S72001A Fracture of unspecified part of neck of right femur, initial encounter for closed fracture: Secondary | ICD-10-CM

## 2021-05-15 DIAGNOSIS — I152 Hypertension secondary to endocrine disorders: Secondary | ICD-10-CM

## 2021-05-15 DIAGNOSIS — E1165 Type 2 diabetes mellitus with hyperglycemia: Secondary | ICD-10-CM | POA: Diagnosis not present

## 2021-05-15 MED ORDER — TRULICITY 1.5 MG/0.5ML ~~LOC~~ SOAJ
1.5000 mg | SUBCUTANEOUS | 4 refills | Status: DC
Start: 2021-05-15 — End: 2022-05-21

## 2021-05-15 MED ORDER — BASAGLAR KWIKPEN 100 UNIT/ML ~~LOC~~ SOPN: 30.0000 [IU] | PEN_INJECTOR | Freq: Every day | SUBCUTANEOUS | 4 refills | Status: DC

## 2021-05-15 NOTE — Progress Notes (Signed)
Subjective:  Patient ID: Patrick Moss, male    DOB: 1940/12/28  Age: 80 y.o. MRN: [id number]  CC: Transitions Of Care   HPI Patrick Moss presents for follow-up of hospitalization for fractured hip.  He fell in a parking lot and local store on November 17.  He was admitted to [facility] on the same day and had a intramedullary nailing of the right femur performed on November 18.  He was discharged on the 22nd and has been home getting home health physical therapy since that time.  He has a brother staying with him to help him.  He has moved into a ground-floor room so he does not have to go up or down steps.  He brought all of his medications with him today for review.  We discussed his Lantus.  He has not taken it in a couple days and his sugars been good.  He has been titrating Trulicity recently.  He was originally on 70 units of Lantus and recently decreased to 67.  However since hospitalization his blood sugars been good and he has not been taking it regularly.  He has not had any low blood sugars.  His readings at home are around 120 -140 both fasting and postprandial.  He is also concerned about his other medications when to take them etc. including the amlodipine and, Crestor, valsartan HCT and his Celebrex.  He says the Celebrex really helps with his back pain.  He is no longer taking any pain medicine for the hip.  He is mobile with a walker with assistance.  He was given a stool softener to take at discharge.  I told him that his lungs he was not needing to take the opiates he did not need to take a stool softener unless he develops some constipation otherwise.  Depression screen [facility] 2/9 05/15/2021 04/29/2021 01/24/2021  Decreased Interest 0 0 0  Down, Depressed, Hopeless 0 0 0  PHQ - 2 Score 0 0 0    History Patrick Moss has a past medical history of Diabetes mellitus without complication ([place]), Hyperlipidemia, and Hypertension.   He has a past surgical history  that includes R foot fx.   His family history includes Diabetes in his brother, father, and mother.He reports that he has never smoked. He has never used smokeless tobacco. He reports that he does not drink alcohol and does not use drugs.    ROS Review of Systems  Constitutional: Negative.   HENT: Negative.    Eyes:  Negative for visual disturbance.  Respiratory:  Negative for cough and shortness of breath.   Cardiovascular:  Negative for chest pain and leg swelling.  Gastrointestinal:  Negative for abdominal pain, diarrhea, nausea and vomiting.  Genitourinary:  Negative for difficulty urinating.  Musculoskeletal:  Negative for arthralgias and myalgias.  Skin:  Negative for rash.  Neurological:  Negative for headaches.  Psychiatric/Behavioral:  Negative for sleep disturbance.    Objective:  BP (!) 125/56   Pulse 92   Temp 98.4 F (36.9 C)   SpO2 96%   BP Readings from Last 3 Encounters:  05/15/21 (!) 125/56  04/29/21 135/72  02/19/21 138/72    Wt Readings from Last 3 Encounters:  04/29/21 199 lb 6.4 oz (90.4 kg)  02/19/21 199 lb 6.4 oz (90.4 kg)  01/24/21 200 lb 6.4 oz (90.9 kg)     Physical Exam Constitutional:      General: He is not in acute distress.  Appearance: He is well-developed.  HENT:     Head: Normocephalic and atraumatic.     Right Ear: External ear normal.     Left Ear: External ear normal.     Nose: Nose normal.  Eyes:     Conjunctiva/sclera: Conjunctivae normal.     Pupils: Pupils are equal, round, and reactive to light.  Cardiovascular:     Rate and Rhythm: Normal rate and regular rhythm.     Heart sounds: Normal heart sounds. No murmur heard. Pulmonary:     Effort: Pulmonary effort is normal. No respiratory distress.     Breath sounds: Normal breath sounds. No wheezing or rales.  Abdominal:     Palpations: Abdomen is soft.     Tenderness: There is no abdominal tenderness.  Musculoskeletal:        General: Signs of injury present. No  swelling or deformity.     Cervical back: Normal range of motion and neck supple.     Comments: He is in a wheelchair today due to the recent fracture.  Is not ready to leave the home ambulatory yet.  He is accompanied by his brother who is his helper today.  Skin:    General: Skin is warm and dry.  Neurological:     Mental Status: He is alert and oriented to person, place, and time.     Deep Tendon Reflexes: Reflexes are normal and symmetric.  Psychiatric:        Mood and Affect: Mood normal.        Behavior: Behavior normal.        Thought Content: Thought content normal.        Judgment: Judgment normal.      Assessment & Plan:   Patrick Moss was seen today for transitions of care.  Diagnoses and all orders for this visit:  Closed fracture of right hip, initial encounter [facility])  Uncontrolled type 2 diabetes mellitus with hyperglycemia ([place])  Hypertension associated with diabetes ([place])  Other orders -     Dulaglutide (TRULICITY) 1.5 WU/9.8JX SOPN; Inject 1.5 mg into the skin once a week. -     Insulin Glargine (BASAGLAR KWIKPEN) 100 UNIT/ML; Inject 30 Units into the skin daily.      I have changed Lerry L. Akhter's Basaglar KwikPen. I am also having him maintain his aspirin, metFORMIN, B-D ULTRAFINE III SHORT PEN, Accu-Chek SmartView, valsartan-hydrochlorothiazide, rosuvastatin, Vitamin D3, celecoxib, amLODipine, chlorproMAZINE, omega-3 fish oil, HYDROcodone-acetaminophen, DSS, calcium citrate, and Trulicity.  Allergies as of 05/15/2021       Reactions   Sulfamethoxazole-trimethoprim Hives        Medication List        Accurate as of May 15, 2021 10:05 AM. If you have any questions, ask your nurse or doctor.          Accu-Chek SmartView test strip Generic drug: glucose blood TEST BLOOD SUGAR TWO TO THREE TIMES DAILY AS DIRECTED   amLODipine 5 MG tablet Commonly known as: NORVASC Take 1 tablet (5 mg total) by mouth daily.   aspirin 81 MG tablet Take 81  mg by mouth daily.   B-D ULTRAFINE III SHORT PEN 31G X 8 MM Misc Generic drug: Insulin Pen Needle USE  1  PEN  NEEDLE AS DIRECTED   Basaglar KwikPen 100 UNIT/ML Inject 30 Units into the skin daily. What changed: how much to take Changed by: [name], MD   calcium citrate 950 (200 Ca) MG tablet Commonly known as: CALCITRATE - dosed in  mg elemental calcium Take by mouth.   celecoxib 400 MG capsule Commonly known as: CELEBREX Take 1 capsule (400 mg total) by mouth daily. With food   chlorproMAZINE 10 MG tablet Commonly known as: THORAZINE Take 1 tablet (10 mg total) by mouth 3 (three) times daily. For hiccups   DSS 100 MG Caps Take by mouth.   HYDROcodone-acetaminophen 5-325 MG tablet Commonly known as: NORCO/VICODIN Take by mouth.   metFORMIN 500 MG 24 hr tablet Commonly known as: GLUCOPHAGE-XR Take 1,000 mg by mouth daily after breakfast.   omega-3 fish oil 1000 MG Caps capsule Commonly known as: MAXEPA Take by mouth.   rosuvastatin 5 MG tablet Commonly known as: CRESTOR Take 1 tablet (5 mg total) by mouth daily.   Trulicity 1.5 BO/1.7PZ Sopn Generic drug: Dulaglutide Inject 1.5 mg into the skin once a week.   valsartan-hydrochlorothiazide 320-25 MG tablet Commonly known as: DIOVAN-HCT Take 1 tablet by mouth daily. For Blood Pressure   Vitamin D3 50 MCG (2000 UT) Tabs Take by mouth.       We discussed decreasing his Lantus to 30 units daily.  If his sugars start climbing up above 200 consistently we will need for him to go back up somewhat.  He is to let me know if that occurs.  He can also work with our pharmacist Lottie Dawson as well.  She also saw him today.  He should take his fluid pill and metformin in the morning with breakfast.  He should take the Celebrex with food.  He should take each of his medicines consistently at the same time roughly every day whether he chooses morning or evening for them.  Follow-up: As previous arrangement for July 30, 2021 and as needed for the current injury.  [name], M.D.

## 2021-05-21 ENCOUNTER — Ambulatory Visit: Payer: Medicare HMO | Admitting: "Endocrinology

## 2021-05-24 DIAGNOSIS — S7221XD Displaced subtrochanteric fracture of right femur, subsequent encounter for closed fracture with routine healing: Secondary | ICD-10-CM | POA: Diagnosis not present

## 2021-06-05 ENCOUNTER — Ambulatory Visit (INDEPENDENT_AMBULATORY_CARE_PROVIDER_SITE_OTHER): Payer: Medicare HMO | Admitting: Pharmacist

## 2021-06-05 DIAGNOSIS — E1165 Type 2 diabetes mellitus with hyperglycemia: Secondary | ICD-10-CM

## 2021-06-05 DIAGNOSIS — E782 Mixed hyperlipidemia: Secondary | ICD-10-CM

## 2021-06-05 NOTE — Progress Notes (Addendum)
Chronic Care Management Pharmacy Note  06/05/2021 Name:  Patrick Moss MRN:  [id number] DOB:  06-05-1941  Summary: T2DM, HLD  Recommendations/Changes made from today's visit: Diabetes: Controlled ; current treatment: BASAGLAR 30 units DAILY;  METFORMIN; TRULICITY 8.8EK SQ WEEKLY Patient enrolled IN [employer]IENT [employer]IST[employer] PROGRAM until 80/03/49 for TRULICITY --MEDICATION TO SHIP TO PCP OFFICE DUE TO PHONE NOT ALWAYS WORKING CONTINUE TRULICITY TO 1.7HX weekly Denies personal and family history of Medullary thyroid cancer (MTC) TOLERATING WELL DECREASE BASAGLAR TO 30 UNITS DAILY (FREE VIA [employer] PATIENT ASSISTANCE) A1c 6.8% Current glucose readings: fasting glucose: <135, post prandial glucose: N/A Denies hypoglycemic/hyperglycemic symptoms Discussed meal planning options and Plate method for healthy eating Avoid sugary drinks and desserts Incorporate balanced protein, non starchy veggies, 1 serving of carbohydrate with each meal Increase water intake Increase physical activity as able Current exercise: N/A Educated on TRULICITY--MEDICATION PURPOSE & SIDE EFFECTS Recommended TRULICITY, DECREASE INSULIN  Assessed patient finances. Patient enrolled in [facility] [employer] patient assistance program for Trulicity & BASAGLAR FOR 2023  HYPERLIPIDEMIA Discussed statin and compliance--patient at goal  Lipid Panel     Component Value Date/Time   CHOL 120 04/29/2021 0906   TRIG 161 (H) 04/29/2021 0906   HDL 38 (L) 04/29/2021 0906   CHOLHDL 3.2 04/29/2021 0906   LDLCALC 55 04/29/2021 0906   LABVLDL 27 04/29/2021 0906    Patient Goals/Self-Care Activities Over the next 90 days, patient will:  - take medications as prescribed check glucose DAILY (FASTING), document, and provide at future appointments  Follow Up Plan: Telephone follow up appointment with care management team member scheduled for: 3 MONTHS   Subjective: Patrick Moss is an 80 y.o. year old male who is a  primary patient of [name], [name], MD.  The CCM team was consulted for assistance with disease management and care coordination needs.    Engaged with patient by telephone for follow up visit in response to provider referral for pharmacy case management and/or care coordination services.   Consent to Services:  The patient was given information about Chronic Care Management services, agreed to services, and gave verbal consent prior to initiation of services.  Please see initial visit note for detailed documentation.   Patient Care Team: [name], MD as PCP - General (Family Medicine) Cassandria Anger, MD as Consulting Physician (Endocrinology) Katy Fitch, Darlina Guys, MD as Consulting Physician (Ophthalmology) Lavera Guise, [facility] as Pharmacist (Family Medicine)  Objective:  Lab Results  Component Value Date   CREATININE 1.43 (H) 04/29/2021   CREATININE 1.53 (H) 01/24/2021   CREATININE 1.36 (H) 10/24/2020    Lab Results  Component Value Date   HGBA1C 6.8 (H) 04/29/2021   Last diabetic Eye exam:  Lab Results  Component Value Date/Time   HMDIABEYEEXA No Retinopathy 09/11/2020 12:00 AM    Last diabetic Foot exam: No results found for: HMDIABFOOTEX      Component Value Date/Time   CHOL 120 04/29/2021 0906   TRIG 161 (H) 04/29/2021 0906   HDL 38 (L) 04/29/2021 0906   CHOLHDL 3.2 04/29/2021 0906   LDLCALC 55 04/29/2021 0906    Hepatic Function Latest Ref Rng & Units 04/29/2021 01/24/2021 10/24/2020  Total Protein 6.0 - 8.5 g/dL 7.0 7.1 7.0  Albumin 3.7 - 4.7 g/dL 4.7 4.7 4.6  AST 0 - 40 IU/L 16 19 19   ALT 0 - 44 IU/L 23 24 24   Alk Phosphatase 44 - 121 IU/L 84 86 76  Total Bilirubin 0.0 -  1.2 mg/dL 0.4 0.6 0.5    Lab Results  Component Value Date/Time   TSH 3.610 12/02/2019 09:10 AM   TSH 3.450 08/29/2019 09:45 AM   FREET4 1.41 12/02/2019 09:10 AM   FREET4 1.27 08/29/2019 09:45 AM    CBC Latest Ref Rng & Units 04/29/2021 01/24/2021 10/24/2020  WBC 3.4 - 10.8  x10E3/uL 7.3 6.8 6.9  Hemoglobin 13.0 - 17.7 g/dL 15.9 14.9 15.2  Hematocrit 37.5 - 51.0 % 46.6 43.2 44.2  Platelets 150 - 450 x10E3/uL 140(L) 246 164    Lab Results  Component Value Date/Time   VD25OH 40.4 07/26/2020 09:01 AM   VD25OH 19.4 (L) 03/29/2020 09:48 AM    Clinical ASCVD: No  The ASCVD Risk score (Arnett DK, et al., 2019) failed to calculate for the following reasons:   The valid total cholesterol range is 130 to 320 mg/dL    Other: (CHADS2VASc if Afib, PHQ9 if depression, MMRC or CAT for COPD, ACT, DEXA)  Social History   Tobacco Use  Smoking Status Never  Smokeless Tobacco Never   BP Readings from Last 3 Encounters:  05/15/21 (!) 125/56  04/29/21 135/72  02/19/21 138/72   Pulse Readings from Last 3 Encounters:  05/15/21 92  04/29/21 68  02/19/21 80   Wt Readings from Last 3 Encounters:  04/29/21 199 lb 6.4 oz (90.4 kg)  02/19/21 199 lb 6.4 oz (90.4 kg)  01/24/21 200 lb 6.4 oz (90.9 kg)    Assessment: Review of patient past medical history, allergies, medications, health status, including review of consultants reports, laboratory and other test data, was performed as part of comprehensive evaluation and provision of chronic care management services.   SDOH:  (Social Determinants of Health) assessments and interventions performed:    CCM Care Plan  Allergies  Allergen Reactions   Sulfamethoxazole-Trimethoprim Hives    Medications Reviewed Today     Reviewed by [name], MD (Physician) on 05/15/21 at 1002  Med List Status: <None>   Medication Order Taking? Sig Documenting Provider Last Dose Status Informant  ACCU-CHEK SMARTVIEW test strip [id number] Yes TEST BLOOD SUGAR TWO TO THREE TIMES DAILY AS DIRECTED Cassandria Anger, MD Taking Active   amLODipine (NORVASC) 5 MG tablet [id number] No Take 1 tablet (5 mg total) by mouth daily.  Patient not taking: Reported on 05/15/2021   [name], MD Not Taking Active   aspirin 81 MG tablet  [id number] Yes Take 81 mg by mouth daily. [provider] Taking Active   B-D ULTRAFINE III SHORT PEN 31G X 8 MM MISC [id number] Yes USE  1  PEN  NEEDLE AS DIRECTED Nida, Marella Chimes, MD Taking Active   calcium citrate (CALCITRATE - DOSED IN MG ELEMENTAL CALCIUM) 950 (200 Ca) MG tablet [id number] Yes Take by mouth. [provider] Taking Active   celecoxib (CELEBREX) 400 MG capsule [id number] No Take 1 capsule (400 mg total) by mouth daily. With food  Patient not taking: Reported on 05/15/2021   [name], MD Not Taking Active   chlorproMAZINE (THORAZINE) 10 MG tablet [id number] No Take 1 tablet (10 mg total) by mouth 3 (three) times daily. For hiccups  Patient not taking: Reported on 05/15/2021   [name], MD Not Taking Active   Cholecalciferol (VITAMIN D3) 50 MCG (2000 UT) TABS [id number] Yes Take by mouth. [provider] Taking Active   Docusate Sodium (DSS) 100 MG CAPS [id number] Yes Take by mouth. [provider] Taking Active   Dulaglutide (TRULICITY) 1.5 UY/3.7QD SOPN  [id number]  Inject 1.5 mg into the skin once a week. [name], MD  Active   HYDROcodone-acetaminophen (NORCO/VICODIN) 5-325 MG tablet [id number] No Take by mouth.  Patient not taking: Reported on 05/15/2021   [provider] Not Taking Active   Insulin Glargine (BASAGLAR KWIKPEN) 100 UNIT/ML [id number]  Inject 30 Units into the skin daily. [name], MD  Active   metFORMIN (GLUCOPHAGE-XR) 500 MG 24 hr tablet [id number] No Take 1,000 mg by mouth daily after breakfast.  Patient not taking: Reported on 05/15/2021   [provider] Not Taking Active            Med Note Georgiann Cocker, AMY E   Tue Oct 02, 2020  8:39 AM) Taking one per day  omega-3 fish oil (MAXEPA) 1000 MG CAPS capsule [id number] No Take by mouth.  Patient not taking: Reported on 05/15/2021   [provider] Not Taking Active   rosuvastatin (CRESTOR) 5 MG tablet [id number] No Take 1 tablet  (5 mg total) by mouth daily.  Patient not taking: Reported on 05/15/2021   [name], MD Not Taking Active   valsartan-hydrochlorothiazide (DIOVAN-HCT) 320-25 MG tablet [id number] No Take 1 tablet by mouth daily. For Blood Pressure  Patient not taking: Reported on 05/15/2021   [name], MD Not Taking Active             Patient Active Problem List   Diagnosis Date Noted   Closed fracture of right hip ([place]) 05/02/2021   Uncontrolled type 2 diabetes mellitus with hyperglycemia ([place]) 04/07/2019   Intractable hiccups 09/10/2018   Mixed hyperlipidemia 01/19/2017   Class 1 obesity due to excess calories without serious comorbidity with body mass index (BMI) of 32.0 to 32.9 in adult 10/14/2016   Hypertension associated with diabetes ([place]) 03/12/2016   Uncontrolled type 2 diabetes mellitus without complication, without long-term current use of insulin 05/16/2015   Essential hypertension, benign 05/16/2015   Tinea 11/02/2013   Type 2 diabetes mellitus with neurological complications ([place]) 08/28/9456    Immunization History  Administered Date(s) Administered   Fluad Quad(high Dose 65+) 03/29/2020   Influenza, High Dose Seasonal PF 03/28/2015, 04/07/2016, 04/30/2016, 03/20/2017, 03/17/2018, 03/07/2019   Influenza,inj,Quad PF,6+ Mos 04/07/2016, 04/30/2016, 03/20/2017, 03/17/2018   Moderna Sars-Covid-2 Vaccination 07/06/2019, 08/03/2019, 05/02/2020   Pneumococcal Conjugate-13 06/30/2016   Pneumococcal Polysaccharide-23 07/15/2017   Tdap 12/27/2014   Zoster Recombinat (Shingrix) 01/24/2021    Conditions to be addressed/monitored: HLD and DMII  Care Plan : PHARMD MEDICATION MANAGEMENT  Updates made by Lavera Guise, [place] since 06/05/2021 12:00 AM     Problem: DISEASE PROGRESSION PREVENTION   Priority: High     Long-Range Goal: T2DM   Recent Progress: On track  Priority: High  Note:   Current Barriers:  Unable to independently afford treatment regimen Unable to  achieve control of T2DM  Suboptimal therapeutic regimen for T2DM  Pharmacist Clinical Goal(s):  Over the next 90 days, patient will verbalize ability to afford treatment regimen achieve control of T2DM as evidenced by GOAL A1C<7%  through collaboration with PharmD and provider.    Interventions: 1:1 collaboration with [name], MD regarding development and update of comprehensive plan of care as evidenced by provider attestation and co-signature Inter-disciplinary care team collaboration (see longitudinal plan of care) Comprehensive medication review performed; medication list updated in electronic medical record  Diabetes: Controlled (improving); current treatment: BASAGLAR 65 units DAILY;  METFORMIN; TRULICITY 5.9YT SQ WEEKLY Patient enrolled IN [place] until 06/15/22 for  TRULICITY --MEDICATION TO SHIP TO PCP OFFICE DUE TO PHONE NOT ALWAYS WORKING CONTINUE TRULICITY TO 7.0VX weekly Denies personal and family history of Medullary thyroid cancer (MTC) TOLERATING WELL DECREASE BASAGLAR TO 30 UNITS DAILY (FREE VIA [employer] PATIENT ASSISTANCE) A1c 6.8% Current glucose readings: fasting glucose: <135, post prandial glucose: N/A Denies hypoglycemic/hyperglycemic symptoms Discussed meal planning options and Plate method for healthy eating Avoid sugary drinks and desserts Incorporate balanced protein, non starchy veggies, 1 serving of carbohydrate with each meal Increase water intake Increase physical activity as able Current exercise: N/A Educated on TRULICITY--MEDICATION PURPOSE & SIDE EFFECTS Recommended TRULICITY, DECREASE INSULIN  Assessed patient finances. Patient enrolled in [employer] patient assistance program for Trulicity & BASAGLAR FOR 2023  HYPERLIPIDEMIA Discussed statin and compliance--patient at goal  Lipid Panel     Component Value Date/Time   CHOL 120 04/29/2021 0906   TRIG 161 (H) 04/29/2021 0906   HDL 38 (L) 04/29/2021 0906    CHOLHDL 3.2 04/29/2021 0906   LDLCALC 55 04/29/2021 0906   LABVLDL 27 04/29/2021 0906    Patient Goals/Self-Care Activities Over the next 90 days, patient will:  - take medications as prescribed check glucose DAILY (FASTING), document, and provide at future appointments  Follow Up Plan: Telephone follow up appointment with care management team member scheduled for: 3 MONTHS     Medication Assistance:  BASAGLAR/TRULICITY obtained through [place] medication assistance program.  Enrollment ends 05/2022  Patient's preferred pharmacy is:  [facility] [address], [place] - [place] [address] [place] [address] Phone: [phone] Fax: [phone]  [facility] [place], [place] [address] Phone: [phone] Fax: [phone]  [facility] [place], [place] - [address] [place] [address] Phone: [phone] Fax: [phone]   Follow Up:  Patient agrees to Care Plan and Follow-up.  Plan: Telephone follow up appointment with care management team member scheduled for:  08/2021  Regina Eck, PharmD, BCPS Clinical Pharmacist, [place]  II Phone [phone]

## 2021-06-05 NOTE — Patient Instructions (Signed)
Visit Information  Thank you for taking time to visit with me today. Please don't hesitate to contact me if I can be of assistance to you before our next scheduled telephone appointment.  Following are the goals we discussed today:  Current Barriers:  Unable to independently afford treatment regimen Unable to achieve control of T2DM  Suboptimal therapeutic regimen for T2DM  Pharmacist Clinical Goal(s):  Over the next 90 days, patient will verbalize ability to afford treatment regimen achieve control of T2DM as evidenced by GOAL A1C<7% through collaboration with PharmD and provider.    Interventions: 1:1 collaboration with [name], MD regarding development and update of comprehensive plan of care as evidenced by provider attestation and co-signature Inter-disciplinary care team collaboration (see longitudinal plan of care) Comprehensive medication review performed; medication list updated in electronic medical record  Diabetes: Controlled (improving); current treatment: BASAGLAR 65 units DAILY;  METFORMIN; TRULICITY 1.5MG  SQ WEEKLY Patient enrolled IN [facility] [place] until 64/40/34 for TRULICITY --MEDICATION TO SHIP TO PCP OFFICE DUE TO PHONE NOT ALWAYS WORKING CONTINUE TRULICITY TO 1.5mg  weekly Denies personal and family history of Medullary thyroid cancer (MTC) TOLERATING WELL DECREASE BASAGLAR TO 30 UNITS DAILY (FREE VIA [employer] PATIENT ASSISTANCE) A1c 6.8% Current glucose readings: fasting glucose: <135, post prandial glucose: N/A Denies hypoglycemic/hyperglycemic symptoms Discussed meal planning options and Plate method for healthy eating Avoid sugary drinks and desserts Incorporate balanced protein, non starchy veggies, 1 serving of carbohydrate with each meal Increase water intake Increase physical activity as able Current exercise: N/A Educated on TRULICITY--MEDICATION PURPOSE & SIDE EFFECTS Recommended TRULICITY, DECREASE INSULIN   Assessed patient finances. Patient enrolled in [employer] patient assistance program for Trulicity & BASAGLAR FOR 2023  HYPERLIPIDEMIA Discussed statin and compliance--patient at goal  Lipid Panel     Component Value Date/Time   CHOL 120 04/29/2021 0906   TRIG 161 (H) 04/29/2021 0906   HDL 38 (L) 04/29/2021 0906   CHOLHDL 3.2 04/29/2021 0906   LDLCALC 55 04/29/2021 0906   LABVLDL 27 04/29/2021 0906     Patient Goals/Self-Care Activities Over the next 90 days, patient will:  - take medications as prescribed check glucose DAILY (FASTING), document, and provide at future appointments  Follow Up Plan: Telephone follow up appointment with care management team member scheduled for: 3 MONTHS  Please call the care guide team at [phone] if you need to cancel or reschedule your appointment.    The patient verbalized understanding of instructions, educational materials, and care plan provided today and declined offer to receive copy of patient instructions, educational materials, and care plan.     Regina Eck, PharmD, BCPS Clinical Pharmacist, [place]  II Phone [phone]

## 2021-06-15 DIAGNOSIS — E782 Mixed hyperlipidemia: Secondary | ICD-10-CM

## 2021-06-15 DIAGNOSIS — E1165 Type 2 diabetes mellitus with hyperglycemia: Secondary | ICD-10-CM

## 2021-06-25 DIAGNOSIS — S72001D Fracture of unspecified part of neck of right femur, subsequent encounter for closed fracture with routine healing: Secondary | ICD-10-CM | POA: Diagnosis not present

## 2021-06-25 DIAGNOSIS — M1611 Unilateral primary osteoarthritis, right hip: Secondary | ICD-10-CM | POA: Diagnosis not present

## 2021-06-25 DIAGNOSIS — M8580 Other specified disorders of bone density and structure, unspecified site: Secondary | ICD-10-CM | POA: Diagnosis not present

## 2021-06-25 DIAGNOSIS — S72141D Displaced intertrochanteric fracture of right femur, subsequent encounter for closed fracture with routine healing: Secondary | ICD-10-CM | POA: Diagnosis not present

## 2021-07-03 ENCOUNTER — Other Ambulatory Visit: Payer: Self-pay | Admitting: *Deleted

## 2021-07-03 MED ORDER — TRUE METRIX BLOOD GLUCOSE TEST VI STRP: ORAL_STRIP | 3 refills | Status: DC

## 2021-07-03 NOTE — Progress Notes (Signed)
Received notification from [place] regarding approval for [address]. Patient assistance approved from 06/29/21 to 06/15/22.  MEDICATION WILL SHIP TO [facility]PS OFFICE [facility])  Phone: [phone]

## 2021-07-30 ENCOUNTER — Ambulatory Visit: Payer: Medicare HMO | Admitting: Family Medicine

## 2021-08-02 ENCOUNTER — Telehealth: Payer: Self-pay | Admitting: Pharmacist

## 2021-08-02 NOTE — Telephone Encounter (Signed)
Patient aware he has #2 boxes of insulin in fridge for pick up [place] via [employer] patient assistance

## 2021-08-06 DIAGNOSIS — S72001D Fracture of unspecified part of neck of right femur, subsequent encounter for closed fracture with routine healing: Secondary | ICD-10-CM | POA: Diagnosis not present

## 2021-08-06 DIAGNOSIS — Z881 Allergy status to other antibiotic agents status: Secondary | ICD-10-CM | POA: Diagnosis not present

## 2021-08-06 DIAGNOSIS — Z882 Allergy status to sulfonamides status: Secondary | ICD-10-CM | POA: Diagnosis not present

## 2021-08-13 ENCOUNTER — Ambulatory Visit: Payer: Medicare HMO | Admitting: Family Medicine

## 2021-08-27 ENCOUNTER — Encounter: Payer: Self-pay | Admitting: Family Medicine

## 2021-08-27 ENCOUNTER — Ambulatory Visit (INDEPENDENT_AMBULATORY_CARE_PROVIDER_SITE_OTHER): Payer: Medicare HMO | Admitting: Family Medicine

## 2021-08-27 VITALS — BP 129/70 | HR 68 | Temp 97.7°F | Ht 66.0 in | Wt 183.6 lb

## 2021-08-27 DIAGNOSIS — E1165 Type 2 diabetes mellitus with hyperglycemia: Secondary | ICD-10-CM | POA: Diagnosis not present

## 2021-08-27 DIAGNOSIS — L2082 Flexural eczema: Secondary | ICD-10-CM | POA: Diagnosis not present

## 2021-08-27 DIAGNOSIS — E782 Mixed hyperlipidemia: Secondary | ICD-10-CM

## 2021-08-27 DIAGNOSIS — I10 Essential (primary) hypertension: Secondary | ICD-10-CM

## 2021-08-27 DIAGNOSIS — E1159 Type 2 diabetes mellitus with other circulatory complications: Secondary | ICD-10-CM

## 2021-08-27 DIAGNOSIS — Z23 Encounter for immunization: Secondary | ICD-10-CM

## 2021-08-27 DIAGNOSIS — I152 Hypertension secondary to endocrine disorders: Secondary | ICD-10-CM | POA: Diagnosis not present

## 2021-08-27 LAB — BAYER DCA HB A1C WAIVED: HB A1C (BAYER DCA - WAIVED): 6.6 % — ABNORMAL HIGH (ref 4.8–5.6)

## 2021-08-27 MED ORDER — TRIAMCINOLONE ACETONIDE 0.1 % EX CREA: 1.0000 "application " | TOPICAL_CREAM | Freq: Three times a day (TID) | CUTANEOUS | 0 refills | Status: DC

## 2021-08-27 MED ORDER — DICLOFENAC SODIUM 1 % EX GEL: 4.0000 g | Freq: Four times a day (QID) | CUTANEOUS | Status: DC

## 2021-08-27 NOTE — Progress Notes (Signed)
? ?Subjective:  ?Patient ID: Patrick Moss,  ?male    DOB: [birth year]  Age: 81 y.o.  ? ? ?CC: Medical Management of Chronic Issues ? ? ?HPI ?Damyon L Newhard presents for  follow-up of hypertension. Patient has no history of headache chest pain or shortness of breath or recent cough. Patient also denies symptoms of TIA such as numbness weakness lateralizing. Patient denies side effects from medication. States taking it regularly. ? ?Patient also  in for follow-up of elevated cholesterol. Doing well without complaints on current medication. Denies side effects  including myalgia and arthralgia and nausea. Also in today for liver function testing. Currently no chest pain, shortness of breath or other cardiovascular related symptoms noted. ? ?Follow-up of diabetes. Patient does check blood sugar at home. Readings run between 80 and 130 ?Patient denies symptoms such as excessive hunger or urinary frequency, excessive hunger, nausea ?No significant hypoglycemic spells noted. ?Medications reviewed. Pt reports taking them regularly. Pt. denies complication/adverse reaction today.  ? ? ?History ?Kaymon has a past medical history of Diabetes mellitus without complication ([place]), Hyperlipidemia, and Hypertension.  ? ?He has a past surgical history that includes R foot fx.  ? ?His family history includes Diabetes in his brother, father, and mother.He reports that he has never smoked. He has never used smokeless tobacco. He reports that he does not drink alcohol and does not use drugs. ? ?Current Outpatient Medications on File Prior to Visit  ?Medication Sig Dispense Refill  ? aspirin 81 MG tablet Take 81 mg by mouth daily.    ? B-D ULTRAFINE III SHORT PEN 31G X 8 MM MISC USE  1  PEN  NEEDLE AS DIRECTED 270 each 3  ? chlorproMAZINE (THORAZINE) 10 MG tablet Take 1 tablet (10 mg total) by mouth 3 (three) times daily. For hiccups 30 tablet 0  ? Cholecalciferol (VITAMIN D3) 50 MCG (2000 UT) TABS Take by mouth.    ? Dulaglutide  (TRULICITY) 1.5 BD/5.3GD SOPN Inject 1.5 mg into the skin once a week. 6 mL 4  ? glucose blood (TRUE METRIX BLOOD GLUCOSE TEST) test strip TEST BLOOD SUGAR TWO TO THREE TIMES DAILY Dx E11.65 300 each 3  ? Insulin Glargine (BASAGLAR KWIKPEN) 100 UNIT/ML Inject 30 Units into the skin daily. 45 mL 4  ? metFORMIN (GLUCOPHAGE-XR) 500 MG 24 hr tablet Take 1,000 mg by mouth daily after breakfast.    ? omega-3 fish oil (MAXEPA) 1000 MG CAPS capsule Take by mouth.    ? amLODipine (NORVASC) 5 MG tablet Take 1 tablet (5 mg total) by mouth daily. (Patient not taking: Reported on 05/15/2021) 90 tablet 3  ? celecoxib (CELEBREX) 400 MG capsule Take 1 capsule (400 mg total) by mouth daily. With food (Patient not taking: Reported on 05/15/2021) 90 capsule 3  ? HYDROcodone-acetaminophen (NORCO/VICODIN) 5-325 MG tablet Take by mouth. (Patient not taking: Reported on 05/15/2021)    ? rosuvastatin (CRESTOR) 5 MG tablet Take 1 tablet (5 mg total) by mouth daily. (Patient not taking: Reported on 05/15/2021) 90 tablet 3  ? valsartan-hydrochlorothiazide (DIOVAN-HCT) 320-25 MG tablet Take 1 tablet by mouth daily. For Blood Pressure (Patient not taking: Reported on 05/15/2021) 90 tablet 3  ? ?No current facility-administered medications on file prior to visit.  ? ? ?ROS ?Review of Systems  ?Constitutional:  Negative for fever.  ?Respiratory:  Negative for shortness of breath.   ?Cardiovascular:  Negative for chest pain.  ?Musculoskeletal:  Positive for arthralgias (right knee, since hip fx) and gait  problem (not walking well since surgery. Taking PT).  ?Skin:  Negative for rash (right ankle, itches).  ? ?Objective:  ?BP 129/70   Pulse 68   Temp 97.7 ?F (36.5 ?C)   Ht 5' 6"  (1.676 m)   Wt 183 lb 9.6 oz (83.3 kg)   SpO2 98%   BMI 29.63 kg/m?  ? ?BP Readings from Last 3 Encounters:  ?08/27/21 129/70  ?05/15/21 (!) 125/56  ?04/29/21 135/72  ? ? ?Wt Readings from Last 3 Encounters:  ?08/27/21 183 lb 9.6 oz (83.3 kg)  ?04/29/21 199 lb 6.4 oz  (90.4 kg)  ?02/19/21 199 lb 6.4 oz (90.4 kg)  ? ? ? ?Physical Exam ?Musculoskeletal:     ?   General: Tenderness (right knee anterior) present.  ?Skin: ?   Findings: Rash (exematous eruption at anterior right ankle) present.  ? ? ?Diabetic Foot Exam - Simple   ?No data filed ?  ? ? ? ? ?Assessment & Plan:  ? ?Durante was seen today for medical management of chronic issues. ? ?Diagnoses and all orders for this visit: ? ?Uncontrolled type 2 diabetes mellitus with hyperglycemia ([place]) ?-     Bayer DCA Hb A1c Waived ? ?Mixed hyperlipidemia ?-     Lipid panel ? ?Essential hypertension, benign ?-     CBC with Differential/Platelet ?-     CMP14+EGFR ? ?Flexural eczema ? ?Hypertension associated with diabetes ([place]) ? ?Other orders ?-     triamcinolone cream (KENALOG) 0.1 %; Apply 1 application. topically 3 (three) times daily. For the ankle area ?-     diclofenac Sodium (VOLTAREN) 1 % GEL; Apply 4 g topically 4 (four) times daily. For the knee ? ? ?I am having Alamin L. Bjorklund start on triamcinolone cream and diclofenac Sodium. I am also having him maintain his aspirin, metFORMIN, B-D ULTRAFINE III SHORT PEN, valsartan-hydrochlorothiazide, rosuvastatin, Vitamin D3, celecoxib, amLODipine, chlorproMAZINE, omega-3 fish oil, HYDROcodone-acetaminophen, Trulicity, Basaglar KwikPen, and True Metrix Blood Glucose Test. ? ?Meds ordered this encounter  ?Medications  ? triamcinolone cream (KENALOG) 0.1 %  ?  Sig: Apply 1 application. topically 3 (three) times daily. For the ankle area  ?  Dispense:  45 g  ?  Refill:  0  ? diclofenac Sodium (VOLTAREN) 1 % GEL  ?  Sig: Apply 4 g topically 4 (four) times daily. For the knee  ? ? ? ?Follow-up: Return in about 3 months (around 11/27/2021). ? ?[name], M.D. ?

## 2021-08-27 NOTE — Addendum Note (Signed)
Addended by: Baldomero Lamy B on: 08/27/2021 04:55 PM ? ? Modules accepted: Orders ? ?

## 2021-08-28 LAB — CBC WITH DIFFERENTIAL/PLATELET
Basophils Absolute: 0 10*3/uL (ref 0.0–0.2)
Basos: 0 %
EOS (ABSOLUTE): 0.1 10*3/uL (ref 0.0–0.4)
Eos: 1 %
Hematocrit: 47 % (ref 37.5–51.0)
Hemoglobin: 15.7 g/dL (ref 13.0–17.7)
Immature Grans (Abs): 0 10*3/uL (ref 0.0–0.1)
Immature Granulocytes: 0 %
Lymphocytes Absolute: 3 10*3/uL (ref 0.7–3.1)
Lymphs: 35 %
MCH: 28.9 pg (ref 26.6–33.0)
MCHC: 33.4 g/dL (ref 31.5–35.7)
MCV: 87 fL (ref 79–97)
Monocytes Absolute: 0.5 10*3/uL (ref 0.1–0.9)
Monocytes: 6 %
Neutrophils Absolute: 4.9 10*3/uL (ref 1.4–7.0)
Neutrophils: 58 %
Platelets: 270 10*3/uL (ref 150–450)
RBC: 5.43 x10E6/uL (ref 4.14–5.80)
RDW: 14 % (ref 11.6–15.4)
WBC: 8.5 10*3/uL (ref 3.4–10.8)

## 2021-08-28 LAB — LIPID PANEL
Chol/HDL Ratio: 2.8 ratio (ref 0.0–5.0)
Cholesterol, Total: 113 mg/dL (ref 100–199)
HDL: 40 mg/dL (ref >39)
LDL Chol Calc (NIH): 52 mg/dL (ref 0–99)
Triglycerides: 116 mg/dL (ref 0–149)
VLDL Cholesterol Cal: 21 mg/dL (ref 5–40)

## 2021-08-28 LAB — CMP14+EGFR
ALT: 17 IU/L (ref 0–44)
AST: 17 IU/L (ref 0–40)
Albumin/Globulin Ratio: 1.9 (ref 1.2–2.2)
Albumin: 4.6 g/dL (ref 3.7–4.7)
Alkaline Phosphatase: 104 IU/L (ref 44–121)
BUN/Creatinine Ratio: 13 (ref 10–24)
BUN: 16 mg/dL (ref 8–27)
Bilirubin Total: 0.5 mg/dL (ref 0.0–1.2)
CO2: 26 mmol/L (ref 20–29)
Calcium: 10.2 mg/dL (ref 8.6–10.2)
Chloride: 103 mmol/L (ref 96–106)
Creatinine, Ser: 1.19 mg/dL (ref 0.76–1.27)
Globulin, Total: 2.4 g/dL (ref 1.5–4.5)
Glucose: 107 mg/dL — ABNORMAL HIGH (ref 70–99)
Potassium: 4.4 mmol/L (ref 3.5–5.2)
Sodium: 143 mmol/L (ref 134–144)
Total Protein: 7 g/dL (ref 6.0–8.5)
eGFR: 62 mL/min/{1.73_m2} (ref >59)

## 2021-08-28 NOTE — Progress Notes (Signed)
Hello Cynthia,  Your lab result is normal and/or stable.Some minor variations that are not significant are commonly marked abnormal, but do not represent any medical problem for you.  Best regards, Kitana Gage, M.D.

## 2021-09-03 ENCOUNTER — Telehealth: Payer: Medicare HMO

## 2021-09-04 DIAGNOSIS — Z9181 History of falling: Secondary | ICD-10-CM | POA: Insufficient documentation

## 2021-09-04 DIAGNOSIS — M810 Age-related osteoporosis without current pathological fracture: Secondary | ICD-10-CM | POA: Diagnosis not present

## 2021-09-04 DIAGNOSIS — E119 Type 2 diabetes mellitus without complications: Secondary | ICD-10-CM | POA: Diagnosis not present

## 2021-09-04 DIAGNOSIS — S72001D Fracture of unspecified part of neck of right femur, subsequent encounter for closed fracture with routine healing: Secondary | ICD-10-CM | POA: Diagnosis not present

## 2021-10-03 ENCOUNTER — Ambulatory Visit (INDEPENDENT_AMBULATORY_CARE_PROVIDER_SITE_OTHER): Payer: Medicare HMO

## 2021-10-03 VITALS — Wt 183.0 lb

## 2021-10-03 DIAGNOSIS — Z0000 Encounter for general adult medical examination without abnormal findings: Secondary | ICD-10-CM

## 2021-10-03 NOTE — Patient Instructions (Addendum)
Patrick Moss , ?Thank you for taking time to come for your Medicare Wellness Visit. I appreciate your ongoing commitment to your health goals. Please review the following plan we discussed and let me know if I can assist you in the future.  ? ?Screening recommendations/referrals: ?Colonoscopy: No longer required ?Recommended yearly ophthalmology/optometry visit for glaucoma screening and checkup ?Recommended yearly dental visit for hygiene and checkup ? ?Vaccinations: ?Influenza vaccine: Done 2022 at pharmacy? Repeat annually ?Pneumococcal vaccine: Done 06/30/2016 & 07/15/2017 ?Tdap vaccine: Done 12/27/2014 - Repeat in 10 years ?Shingles vaccine: Done  01/24/2021 & 08/27/2021       ?Covid-19: Done 07/06/2019, 08/03/2019, & 05/02/2020 ? ?Advanced directives: Please bring a copy of your health care power of attorney and living will to the office to be added to your chart at your convenience.  ? ?Conditions/risks identified:  Keep up the great work! Aim for 30 minutes of exercise or brisk walking, 6-8 glasses of water, and 5 servings of fruits and vegetables each day.  ? ?Next appointment: Follow up in one year for your annual wellness visit.  ? ?Preventive Care 78 Years and Older, Male ? ?Preventive care refers to lifestyle choices and visits with your health care provider that can promote health and wellness. ?What does preventive care include? ?A yearly physical exam. This is also called an annual well check. ?Dental exams once or twice a year. ?Routine eye exams. Ask your health care provider how often you should have your eyes checked. ?Personal lifestyle choices, including: ?Daily care of your teeth and gums. ?Regular physical activity. ?Eating a healthy diet. ?Avoiding tobacco and drug use. ?Limiting alcohol use. ?Practicing safe sex. ?Taking low doses of aspirin every day. ?Taking vitamin and mineral supplements as recommended by your health care provider. ?What happens during an annual well check? ?The services and  screenings done by your health care provider during your annual well check will depend on your age, overall health, lifestyle risk factors, and family history of disease. ?Counseling  ?Your health care provider may ask you questions about your: ?Alcohol use. ?Tobacco use. ?Drug use. ?Emotional well-being. ?Home and relationship well-being. ?Sexual activity. ?Eating habits. ?History of falls. ?Memory and ability to understand (cognition). ?Work and work Statistician. ?Screening  ?You may have the following tests or measurements: ?Height, weight, and BMI. ?Blood pressure. ?Lipid and cholesterol levels. These may be checked every 5 years, or more frequently if you are over 52 years old. ?Skin check. ?Lung cancer screening. You may have this screening every year starting at age 22 if you have a 30-pack-year history of smoking and currently smoke or have quit within the past 15 years. ?Fecal occult blood test (FOBT) of the stool. You may have this test every year starting at age 55. ?Flexible sigmoidoscopy or colonoscopy. You may have a sigmoidoscopy every 5 years or a colonoscopy every 10 years starting at age 57. ?Prostate cancer screening. Recommendations will vary depending on your family history and other risks. ?Hepatitis C blood test. ?Hepatitis B blood test. ?Sexually transmitted disease (STD) testing. ?Diabetes screening. This is done by checking your blood sugar (glucose) after you have not eaten for a while (fasting). You may have this done every 1-3 years. ?Abdominal aortic aneurysm (AAA) screening. You may need this if you are a current or former smoker. ?Osteoporosis. You may be screened starting at age 50 if you are at high risk. ?Talk with your health care provider about your test results, treatment options, and if necessary, the need  for more tests. ?Vaccines  ?Your health care provider may recommend certain vaccines, such as: ?Influenza vaccine. This is recommended every year. ?Tetanus, diphtheria, and  acellular pertussis (Tdap, Td) vaccine. You may need a Td booster every 10 years. ?Zoster vaccine. You may need this after age 53. ?Pneumococcal 13-valent conjugate (PCV13) vaccine. One dose is recommended after age 8. ?Pneumococcal polysaccharide (PPSV23) vaccine. One dose is recommended after age 109. ?Talk to your health care provider about which screenings and vaccines you need and how often you need them. ?This information is not intended to replace advice given to you by your health care provider. Make sure you discuss any questions you have with your health care provider. ?Document Released: 06/29/2015 Document Revised: 02/20/2016 Document Reviewed: 04/03/2015 ?Elsevier Interactive Patient Education ? 2017 [place]. ? ?Fall Prevention in the Home ?Falls can cause injuries. They can happen to people of all ages. There are many things you can do to make your home safe and to help prevent falls. ?What can I do on the outside of my home? ?Regularly fix the edges of walkways and driveways and fix any cracks. ?Remove anything that might make you trip as you walk through a door, such as a raised step or threshold. ?Trim any bushes or trees on the path to your home. ?Use bright outdoor lighting. ?Clear any walking paths of anything that might make someone trip, such as rocks or tools. ?Regularly check to see if handrails are loose or broken. Make sure that both sides of any steps have handrails. ?Any raised decks and porches should have guardrails on the edges. ?Have any leaves, snow, or ice cleared regularly. ?Use sand or salt on walking paths during winter. ?Clean up any spills in your garage right away. This includes oil or grease spills. ?What can I do in the bathroom? ?Use night lights. ?Install grab bars by the toilet and in the tub and shower. Do not use towel bars as grab bars. ?Use non-skid mats or decals in the tub or shower. ?If you need to sit down in the shower, use a plastic, non-slip stool. ?Keep  the floor dry. Clean up any water that spills on the floor as soon as it happens. ?Remove soap buildup in the tub or shower regularly. ?Attach bath mats securely with double-sided non-slip rug tape. ?Do not have throw rugs and other things on the floor that can make you trip. ?What can I do in the bedroom? ?Use night lights. ?Make sure that you have a light by your bed that is easy to reach. ?Do not use any sheets or blankets that are too big for your bed. They should not hang down onto the floor. ?Have a firm chair that has side arms. You can use this for support while you get dressed. ?Do not have throw rugs and other things on the floor that can make you trip. ?What can I do in the kitchen? ?Clean up any spills right away. ?Avoid walking on wet floors. ?Keep items that you use a lot in easy-to-reach places. ?If you need to reach something above you, use a strong step stool that has a grab bar. ?Keep electrical cords out of the way. ?Do not use floor polish or wax that makes floors slippery. If you must use wax, use non-skid floor wax. ?Do not have throw rugs and other things on the floor that can make you trip. ?What can I do with my stairs? ?Do not leave any items on the stairs. ?  Make sure that there are handrails on both sides of the stairs and use them. Fix handrails that are broken or loose. Make sure that handrails are as long as the stairways. ?Check any carpeting to make sure that it is firmly attached to the stairs. Fix any carpet that is loose or worn. ?Avoid having throw rugs at the top or bottom of the stairs. If you do have throw rugs, attach them to the floor with carpet tape. ?Make sure that you have a light switch at the top of the stairs and the bottom of the stairs. If you do not have them, ask someone to add them for you. ?What else can I do to help prevent falls? ?Wear shoes that: ?Do not have high heels. ?Have rubber bottoms. ?Are comfortable and fit you well. ?Are closed at the toe. Do not  wear sandals. ?If you use a stepladder: ?Make sure that it is fully opened. Do not climb a closed stepladder. ?Make sure that both sides of the stepladder are locked into place. ?Ask someone to hold it for

## 2021-10-03 NOTE — Progress Notes (Signed)
? ?Subjective:  ? Patrick Moss is a 81 y.o. male who presents for Medicare Annual/Subsequent preventive examination. ? ?Virtual Visit via Telephone Note ? ?I connected with  Patrick Moss on 10/03/21 at  8:15 AM EDT by telephone and verified that I am speaking with the correct person using two identifiers. ? ?Location: ?Patient: Home ?Provider: [facility] ?Persons participating in the virtual visit: patient/Nurse Health Advisor ?  ?I discussed the limitations, risks, security and privacy concerns of performing an evaluation and management service by telephone and the availability of in person appointments. The patient expressed understanding and agreed to proceed. ? ?Interactive audio and video telecommunications were attempted between this nurse and patient, however failed, due to patient having technical difficulties OR patient did not have access to video capability.  We continued and completed visit with audio only. ? ?Some vital signs may be absent or patient reported.  ? ?Patrick Esperanza Dionne Ano, LPN  ? ?Review of Systems    ? ?Cardiac Risk Factors include: advanced age (>80mn, >>17women);diabetes mellitus;dyslipidemia;hypertension;male gender ? ?   ?Objective:  ?  ?Today's Vitals  ? 10/03/21 0811 10/03/21 0813  ?Weight: 183 lb (83 kg)   ?PainSc:  2   ? ?Body mass index is 29.54 kg/m?. ? ? ?  10/03/2021  ?  8:26 AM 10/02/2020  ?  8:46 AM 09/30/2019  ?  8:42 AM 09/18/2014  ? 12:07 PM  ?Advanced Directives  ?Does Patient Have a Medical Advance Directive? Yes Yes Yes No  ?Type of [place] will [place] will [place] will   ?Does patient want to make changes to medical advance directive?   No - Patient declined   ?Copy of [place] Chart? No - copy requested No - copy requested No - copy requested   ?Would patient like information on creating a medical advance directive?    No - patient declined information   ? ? ?Current Medications (verified) ?Outpatient Encounter Medications as of 10/03/2021  ?Medication Sig  ? amLODipine (NORVASC) 5 MG tablet Take 1 tablet (5 mg total) by mouth daily.  ? aspirin 81 MG tablet Take 81 mg by mouth daily.  ? B-D ULTRAFINE III SHORT PEN 31G X 8 MM MISC USE  1  PEN  NEEDLE AS DIRECTED  ? celecoxib (CELEBREX) 400 MG capsule Take 1 capsule (400 mg total) by mouth daily. With food  ? Cholecalciferol (VITAMIN D3) 50 MCG (2000 UT) TABS Take by mouth.  ? diclofenac Sodium (VOLTAREN) 1 % GEL Apply 4 g topically 4 (four) times daily. For the knee  ? Dulaglutide (TRULICITY) 1.5 MAC/1.6SASOPN Inject 1.5 mg into the skin once a week.  ? glucose blood (TRUE METRIX BLOOD GLUCOSE TEST) test strip TEST BLOOD SUGAR TWO TO THREE TIMES DAILY Dx E11.65  ? Insulin Glargine (BASAGLAR KWIKPEN) 100 UNIT/ML Inject 30 Units into the skin daily.  ? metFORMIN (GLUCOPHAGE-XR) 500 MG 24 hr tablet Take 1,000 mg by mouth daily after breakfast.  ? omega-3 fish oil (MAXEPA) 1000 MG CAPS capsule Take by mouth.  ? rosuvastatin (CRESTOR) 5 MG tablet Take 1 tablet (5 mg total) by mouth daily.  ? valsartan-hydrochlorothiazide (DIOVAN-HCT) 320-25 MG tablet Take 1 tablet by mouth daily. For Blood Pressure  ? triamcinolone cream (KENALOG) 0.1 % Apply 1 application. topically 3 (three) times daily. For the ankle area (Patient not taking: Reported on 10/03/2021)  ? [DISCONTINUED] chlorproMAZINE (THORAZINE) 10 MG tablet  Take 1 tablet (10 mg total) by mouth 3 (three) times daily. For hiccups (Patient not taking: Reported on 10/03/2021)  ? [DISCONTINUED] HYDROcodone-acetaminophen (NORCO/VICODIN) 5-325 MG tablet Take by mouth. (Patient not taking: Reported on 05/15/2021)  ? ?No facility-administered encounter medications on file as of 10/03/2021.  ? ? ?Allergies (verified) ?Sulfamethoxazole-trimethoprim  ? ?History: ?Past Medical History:  ?Diagnosis Date  ? Diabetes mellitus without complication ([place])   ? Hyperlipidemia   ? Hypertension    ? ?Past Surgical History:  ?Procedure Laterality Date  ? R foot fx    ? ?Family History  ?Problem Relation Age of Onset  ? Diabetes Mother   ? Diabetes Father   ? Diabetes Brother   ? ?Social History  ? ?Socioeconomic History  ? Marital status: Divorced  ?  Spouse name: Not on file  ? Number of children: 2  ? Years of education: 10  ? Highest education level: 10th grade  ?Occupational History  ? Occupation: retired  ?Tobacco Use  ? Smoking status: Never  ? Smokeless tobacco: Never  ?Vaping Use  ? Vaping Use: Never used  ?Substance and Sexual Activity  ? Alcohol use: No  ?  Alcohol/week: 0.0 standard drinks  ? Drug use: No  ? Sexual activity: Not Currently  ?Other Topics Concern  ? Not on file  ?Social History Narrative  ? Lives alone - son lives next door  ? [place] friend on his street - they check on each other several times per day and spend time together  ? ?Social Determinants of Health  ? ?Financial Resource Strain: Low Risk   ? Difficulty of Paying Living Expenses: Not hard at all  ?Food Insecurity: No Food Insecurity  ? Worried About Charity fundraiser in the Last Year: Never true  ? Ran Out of Food in the Last Year: Never true  ?Transportation Needs: No Transportation Needs  ? Lack of Transportation (Medical): No  ? Lack of Transportation (Non-Medical): No  ?Physical Activity: Insufficiently Active  ? Days of Exercise per Week: 7 days  ? Minutes of Exercise per Session: 20 min  ?Stress: No Stress Concern Present  ? Feeling of Stress : Not at all  ?Social Connections: Moderately Integrated  ? Frequency of Communication with Friends and Family: More than three times a week  ? Frequency of Social Gatherings with Friends and Family: More than three times a week  ? Attends Religious Services: More than 4 times per year  ? Active Member of Clubs or Organizations: Yes  ? Attends Archivist Meetings: More than 4 times per year  ? Marital Status: Divorced  ? ? ?Tobacco Counseling ?Counseling given: Not  Answered ? ? ?Clinical Intake: ? ?Pre-visit preparation completed: Yes ? ?Pain : 0-10 ?Pain Score: 2  ?Pain Type: Chronic pain ?Pain Location: Hip ?Pain Orientation: Right ?Pain Radiating Towards: right leg, foot ?Pain Descriptors / Indicators: Aching, Discomfort, Shooting ?Pain Onset: More than a month ago ?Pain Frequency: Intermittent ?Pain Relieving Factors: rest ?Effect of Pain on Daily Activities: walks with a cane ? ?Pain Relieving Factors: rest ? ?BMI - recorded: 29.54 ?Nutritional Status: BMI 25 -29 Overweight ?Nutritional Risks: None ?Diabetes: Yes ?CBG done?: No ?Did pt. bring in CBG monitor from home?: No ? ?How often do you need to have someone help you when you read instructions, pamphlets, or other written materials from your doctor or pharmacy?: 1 - Never ? ?Diabetic? Nutrition Risk Assessment: ? ?Has the patient had any N/V/D within the last  2 months?  No  ?Does the patient have any non-healing wounds?  No  ?Has the patient had any unintentional weight loss or weight gain?  No  ? ?Diabetes: ? ?Is the patient diabetic?  Yes  ?If diabetic, was a CBG obtained today?  No  ?Did the patient bring in their glucometer from home?  No  ?How often do you monitor your CBG's? BID - this am 120 per patient.  ? ?Financial Strains and Diabetes Management: ? ?Are you having any financial strains with the device, your supplies or your medication? No .  ?Does the patient want to be seen by Chronic Care Management for management of their diabetes?  No  ?Would the patient like to be referred to a Nutritionist or for Diabetic Management?  No  ? ?Diabetic Exams: ? ?Diabetic Eye Exam: Completed 09/11/2020. He has appt coming up ? ?Diabetic Foot Exam: Completed 10/24/2020. Pt has been advised about the importance in completing this exam. Pt is scheduled for diabetic foot exam on next appt.   ? ?Interpreter Needed?: No ? ?Information entered by :: Danile Trier, LPN ? ? ?Activities of Daily Living ? ?  10/03/2021  ?  8:26 AM  ?In  your present state of health, do you have any difficulty performing the following activities:  ?Hearing? 0  ?Vision? 0  ?Difficulty concentrating or making decisions? 0  ?Walking or climbing stairs? 1  ?C

## 2021-10-04 ENCOUNTER — Telehealth: Payer: Self-pay | Admitting: Pharmacist

## 2021-10-04 NOTE — Telephone Encounter (Signed)
Called patient to let him his trulicity  ?Arrived #4 boxes trulicity 1.'5mg'$  ?  ?

## 2021-10-08 ENCOUNTER — Other Ambulatory Visit: Payer: Self-pay | Admitting: Family Medicine

## 2021-10-08 DIAGNOSIS — E782 Mixed hyperlipidemia: Secondary | ICD-10-CM

## 2021-10-08 DIAGNOSIS — E1159 Type 2 diabetes mellitus with other circulatory complications: Secondary | ICD-10-CM

## 2021-10-14 ENCOUNTER — Telehealth: Payer: Self-pay | Admitting: Family Medicine

## 2021-10-14 MED ORDER — METFORMIN HCL ER 500 MG PO TB24: 1000.0000 mg | ORAL_TABLET | Freq: Every day | ORAL | 1 refills | Status: DC

## 2021-10-14 NOTE — Telephone Encounter (Signed)
?  Prescription Request ? ?10/14/2021 ? ?Is this a "Controlled Substance" medicine? no ? ?Have you seen your PCP in the last 2 weeks? no ? ?If YES, route message to pool  -  If NO, patient needs to be scheduled for appointment. ? ?What is the name of the medication or equipment? Metformin 500 mg ? ?Have you contacted your pharmacy to request a refill? no  ? ?Which pharmacy would you like this sent to? [facility] ? ? ?Patient notified that their request is being sent to the clinical staff for review and that they should receive a response within 2 business days.  ?  ?

## 2021-10-14 NOTE — Telephone Encounter (Signed)
Patient aware rx sent to [facility].  ?

## 2021-10-15 ENCOUNTER — Other Ambulatory Visit: Payer: Self-pay

## 2021-10-15 DIAGNOSIS — R972 Elevated prostate specific antigen [PSA]: Secondary | ICD-10-CM

## 2021-10-22 ENCOUNTER — Other Ambulatory Visit: Payer: Medicare HMO

## 2021-10-22 DIAGNOSIS — R972 Elevated prostate specific antigen [PSA]: Secondary | ICD-10-CM | POA: Diagnosis not present

## 2021-10-23 LAB — PSA: Prostate Specific Ag, Serum: 13.2 ng/mL — ABNORMAL HIGH (ref 0.0–4.0)

## 2021-10-29 ENCOUNTER — Ambulatory Visit: Payer: Medicare HMO | Admitting: Urology

## 2021-10-29 ENCOUNTER — Encounter: Payer: Self-pay | Admitting: Urology

## 2021-10-29 VITALS — BP 134/71 | HR 71

## 2021-10-29 DIAGNOSIS — R972 Elevated prostate specific antigen [PSA]: Secondary | ICD-10-CM

## 2021-10-29 MED ORDER — LEVOFLOXACIN 750 MG PO TABS: 750.0000 mg | ORAL_TABLET | Freq: Every day | ORAL | 0 refills | Status: AC

## 2021-10-29 NOTE — Progress Notes (Signed)
H&P ? ?Chief Complaint: Elevated PSA ? ?History of Present Illness: Patient was seen back in 2020 for follow-up of elevated PSA. ? ?He underwent ultrasound and biopsy of his prostate in November 2012 by Dr. Glendell Docker who used to be in [place].  At that time the patient's PSA was 6.19, prostate volume 69 mL.  Although I did not have direct evidence, the patient stated that all biopsies were negative. ? ?He presents again for elevating trend.  PSA was recently checked and was 13.2.  It was two-point slower in late 2022.  He does have some slowing of his stream but this is nonbothersome.  He has nocturia x3, again nonbothersome.  No recent urinary tract infections.  His identical twin was treated for prostate cancer last year. ? ? ? ?Past Medical History:  ?Diagnosis Date  ? Diabetes mellitus without complication ([place])   ? Hyperlipidemia   ? Hypertension   ? ? ?Past Surgical History:  ?Procedure Laterality Date  ? R foot fx    ? ? ?Home Medications:  ?Allergies as of 10/29/2021   ? ?   Reactions  ? Sulfamethoxazole-trimethoprim Hives  ? ?  ? ?  ?Medication List  ?  ? ?  ? Accurate as of Oct 29, 2021  8:17 AM. If you have any questions, ask your nurse or doctor.  ?  ?  ? ?  ? ?amLODipine 5 MG tablet ?Commonly known as: NORVASC ?TAKE 1 TABLET EVERY DAY ?  ?aspirin 81 MG tablet ?Take 81 mg by mouth daily. ?  ?B-D ULTRAFINE III SHORT PEN 31G X 8 MM Misc ?Generic drug: Insulin Pen Needle ?USE  1  PEN  NEEDLE AS DIRECTED ?  ?Basaglar KwikPen 100 UNIT/ML ?Inject 30 Units into the skin daily. ?  ?celecoxib 400 MG capsule ?Commonly known as: CELEBREX ?Take 1 capsule (400 mg total) by mouth daily. With food ?  ?diclofenac Sodium 1 % Gel ?Commonly known as: Voltaren ?Apply 4 g topically 4 (four) times daily. For the knee ?  ?metFORMIN 500 MG 24 hr tablet ?Commonly known as: GLUCOPHAGE-XR ?Take 2 tablets (1,000 mg total) by mouth daily after breakfast. ?  ?omega-3 fish oil 1000 MG Caps capsule ?Commonly known as: MAXEPA ?Take by mouth. ?   ?rosuvastatin 5 MG tablet ?Commonly known as: CRESTOR ?TAKE 1 TABLET EVERY DAY ?  ?triamcinolone cream 0.1 % ?Commonly known as: KENALOG ?Apply 1 application. topically 3 (three) times daily. For the ankle area ?  ?True Metrix Blood Glucose Test test strip ?Generic drug: glucose blood ?TEST BLOOD SUGAR TWO TO THREE TIMES DAILY Dx E11.65 ?  ?Trulicity 1.5 KG/4.0NU Sopn ?Generic drug: Dulaglutide ?Inject 1.5 mg into the skin once a week. ?  ?valsartan-hydrochlorothiazide 320-25 MG tablet ?Commonly known as: DIOVAN-HCT ?TAKE 1 TABLET EVERY DAY FOR BLOOD PRESSURE ?  ?Vitamin D3 50 MCG (2000 UT) Tabs ?Take by mouth. ?  ? ?  ? ? ?Allergies:  ?Allergies  ?Allergen Reactions  ? Sulfamethoxazole-Trimethoprim Hives  ? ? ?Family History  ?Problem Relation Age of Onset  ? Diabetes Mother   ? Diabetes Father   ? Diabetes Brother   ? ? ?Social History:  reports that he has never smoked. He has never used smokeless tobacco. He reports that he does not drink alcohol and does not use drugs. ? ?ROS: ?A complete review of systems was performed.  All systems are negative except for pertinent findings as noted. ? ?Physical Exam:  ?Vital signs in last 24 hours: ?There were  no vitals taken for this visit. ?Constitutional:  Alert and oriented, No acute distress ?Cardiovascular: Regular rate  ?Respiratory: Normal respiratory effort ?GI: Abdomen is soft, nontender, nondistended, no abdominal masses. No CVAT.  ?Genitourinary: Phallus buried, uncircumcised.  Both testicles palpable bilaterally but right greater than left hydroceles present.  No inguinal hernias.  Normal anal sphincter tone.  Gland 70 g, right mid prostatic nodule approximately 5 mm in size ?Lymphatic: No lymphadenopathy ?Neurologic: Grossly intact, no focal deficits ?Psychiatric: Normal mood and affect ? ?I have reviewed prior pt notes ? ?I have reviewed notes from referring/previous physicians--Dr. Livia Snellen ? ?I have reviewed urinalysis results-clear ? ?I have independently  reviewed prior imaging.  Ultrasound volume at his visit with Dr. Glendell Docker in 2012 was 69 mL. ? ?I have reviewed prior PSA results ? ? ?Impression/Assessment:  ?1.  BPH by size criteria although patient not that symptomatic ? ?2.  Elevated PSA, increasing trend, prostate nodule ? ?Plan:  ?I have recommended ultrasound and biopsy of the prostate.  This was discussed with the patient, risks and complications as well.  We will perform this next available. ? ?

## 2021-10-29 NOTE — Patient Instructions (Addendum)
? ?  Appointment Time:8:00 ?Appointment Date:11/26/2021 ? ?Location: [facility]  ? ?Prostate Biopsy Instructions ? ?Stop all aspirin or blood thinners (aspirin, plavix, coumadin, warfarin, motrin, ibuprofen, advil, aleve, naproxen, naprosyn) for 7 days prior to the procedure.  If you have any questions about stopping these medications, please contact your primary care physician or cardiologist. ? ?Having a light meal prior to the procedure is recommended.  If you are diabetic or have low blood sugar please bring a small snack or glucose tablet. ? ?A Fleets enema is needed to be purchased over the counter at a local pharmacy and used 2 hours before you scheduled appointment.  This can be purchased over the counter at any pharmacy. ? ?Antibiotics will be administered in the clinic at the time of the procedure and 1 tablet has been sent to your pharmacy. Please take the antibiotic as prescribed.   ? ?Please bring someone with you to the procedure to drive you home if you are given a valium to take prior to your procedure. ? ? ?If you have any questions or concerns, please feel free to call the office at [phone] or send a Mychart message. ? ? ? ?Thank you, ?[facility]  ?

## 2021-10-30 LAB — MICROSCOPIC EXAMINATION
Epithelial Cells (non renal): NONE SEEN /hpf (ref 0–10)
RBC, Urine: NONE SEEN /hpf (ref 0–2)
Renal Epithel, UA: NONE SEEN /hpf
WBC, UA: NONE SEEN /hpf (ref 0–5)

## 2021-10-30 LAB — URINALYSIS, ROUTINE W REFLEX MICROSCOPIC
Bilirubin, UA: NEGATIVE
Glucose, UA: NEGATIVE
Leukocytes,UA: NEGATIVE
Nitrite, UA: NEGATIVE
RBC, UA: NEGATIVE
Specific Gravity, UA: 1.025 (ref 1.005–1.030)
Urobilinogen, Ur: 0.2 mg/dL (ref 0.2–1.0)
pH, UA: 6 (ref 5.0–7.5)

## 2021-11-05 ENCOUNTER — Ambulatory Visit (INDEPENDENT_AMBULATORY_CARE_PROVIDER_SITE_OTHER): Payer: Medicare HMO | Admitting: Pharmacist

## 2021-11-05 DIAGNOSIS — E1149 Type 2 diabetes mellitus with other diabetic neurological complication: Secondary | ICD-10-CM

## 2021-11-05 DIAGNOSIS — E782 Mixed hyperlipidemia: Secondary | ICD-10-CM

## 2021-11-05 NOTE — Patient Instructions (Addendum)
Visit Information  Following are the goals we discussed today:   Diabetes: Controlled-A1C 6.6%, GFR improved to 66; current treatment: BASAGLAR 30 units DAILY;  METFORMIN; TRULICITY 1.'5MG'$  SQ WEEKLY Patient enrolled IN [facility] PATIENT ASSISTANCE PROGRAM until 27/78/24 for TRULICITY --MEDICATION TO SHIP TO PCP OFFICE DUE TO PHONE NOT ALWAYS WORKING CONTINUE TRULICITY 1.'5mg'$  weekly Denies personal and family history of Medullary thyroid cancer (MTC) TOLERATING WELL CONTINUE BASAGLAR 30 UNITS DAILY Current glucose readings: fasting glucose: <135, post prandial glucose: N/A Denies hypoglycemic/hyperglycemic symptoms Discussed meal planning options and Plate method for healthy eating Avoid sugary drinks and desserts Incorporate balanced protein, non starchy veggies, 1 serving of carbohydrate with each meal Increase water intake Increase physical activity as able Current exercise: N/A Educated on TRULICITY--MEDICATION PURPOSE & SIDE EFFECTS Recommended: will continue current management; patient stated blood sugar was starting to increase, but FBG was 109; patient at goal Assessed patient finances. Patient enrolled in [facility] patient assistance program for Trulicity & BASAGLAR FOR 2023  Plan: Telephone follow up appointment with care management team member scheduled for:  3 months  Signature Regina Eck, PharmD, BCPS Clinical Pharmacist, [facility]  II Phone [phone]   Please call the care guide team at [phone] if you need to cancel or reschedule your appointment.   The patient verbalized understanding of instructions, educational materials, and care plan provided today and DECLINED offer to receive copy of patient instructions, educational materials, and care plan.

## 2021-11-05 NOTE — Progress Notes (Signed)
  Chronic Care Management Pharmacy Note  11/05/2021 Name:  Patrick Moss MRN:  [id number] DOB:  01/18/1941  Summary:  Diabetes: Controlled-A1C 6.6%, GFR improved to 66; current treatment: BASAGLAR 30 units DAILY;  METFORMIN; TRULICITY 1.5MG SQ WEEKLY Patient enrolled IN [facility] PATIENT ASSISTANCE PROGRAM until 06/15/22 for TRULICITY --MEDICATION TO SHIP TO PCP OFFICE DUE TO PHONE NOT ALWAYS WORKING CONTINUE TRULICITY 1.5mg weekly Denies personal and family history of Medullary thyroid cancer (MTC) TOLERATING WELL CONTINUE BASAGLAR 30 UNITS DAILY Current glucose readings: fasting glucose: <135, post prandial glucose: N/A Denies hypoglycemic/hyperglycemic symptoms Discussed meal planning options and Plate method for healthy eating Avoid sugary drinks and desserts Incorporate balanced protein, non starchy veggies, 1 serving of carbohydrate with each meal Increase water intake Increase physical activity as able Current exercise: N/A Educated on TRULICITY--MEDICATION PURPOSE & SIDE EFFECTS Recommended: will continue current management; patient stated blood sugar was starting to increase, but FBG was 109; patient at goal Assessed patient finances. Patient enrolled in [facility] [employer] patient assistance program for Trulicity & BASAGLAR FOR 2023   Subjective: Patrick Moss is an 80 y.o. year old male who is a primary patient of [name], Warren, MD.  The CCM team was consulted for assistance with disease management and care coordination needs.    Engaged with patient by telephone for follow up visit in response to provider referral for pharmacy case management and/or care coordination services.   Consent to Services:  The patient was given information about Chronic Care Management services, agreed to services, and gave verbal consent prior to initiation of services.  Please see initial visit note for detailed documentation.   Patient Care Team: [name], Warren, MD as PCP - General  (Family Medicine) Nida, Gebreselassie W, MD as Consulting Physician (Endocrinology) Groat, Richard Scott, MD as Consulting Physician (Ophthalmology) ,  D, RPH as Pharmacist (Family Medicine) Pilson, Holly Tyler-Paris, MD (Orthopedic Surgery)  Objective:  Lab Results  Component Value Date   CREATININE 1.19 08/27/2021   CREATININE 1.43 (H) 04/29/2021   CREATININE 1.53 (H) 01/24/2021    Lab Results  Component Value Date   HGBA1C 6.6 (H) 08/27/2021   Last diabetic Eye exam:  Lab Results  Component Value Date/Time   HMDIABEYEEXA No Retinopathy 09/11/2020 12:00 AM    Last diabetic Foot exam: No results found for: HMDIABFOOTEX      Component Value Date/Time   CHOL 113 08/27/2021 1321   TRIG 116 08/27/2021 1321   HDL 40 08/27/2021 1321   CHOLHDL 2.8 08/27/2021 1321   LDLCALC 52 08/27/2021 1321       Latest Ref Rng & Units 08/27/2021    1:21 PM 04/29/2021    9:06 AM 01/24/2021    9:02 AM  Hepatic Function  Total Protein 6.0 - 8.5 g/dL 7.0   7.0   7.1    Albumin 3.7 - 4.7 g/dL 4.6   4.7   4.7    AST 0 - 40 IU/L 17   16   19    ALT 0 - 44 IU/L 17   23   24    Alk Phosphatase 44 - 121 IU/L 104   84   86    Total Bilirubin 0.0 - 1.2 mg/dL 0.5   0.4   0.6      Lab Results  Component Value Date/Time   TSH 3.610 12/02/2019 09:10 AM   TSH 3.450 08/29/2019 09:45 AM   FREET4 1.41 12/02/2019 09:10 AM   FREET4 1.27 08/29/2019 09:45   AM       Latest Ref Rng & Units 08/27/2021    1:21 PM 04/29/2021    9:06 AM 01/24/2021    9:02 AM  CBC  WBC 3.4 - 10.8 x10E3/uL 8.5   7.3   6.8    Hemoglobin 13.0 - 17.7 g/dL 15.7   15.9   14.9    Hematocrit 37.5 - 51.0 % 47.0   46.6   43.2    Platelets 150 - 450 x10E3/uL 270   140   246      Lab Results  Component Value Date/Time   VD25OH 40.4 07/26/2020 09:01 AM   VD25OH 19.4 (L) 03/29/2020 09:48 AM    Clinical ASCVD: No  The ASCVD Risk score (Arnett DK, et al., 2019) failed to calculate for the following reasons:   The  valid total cholesterol range is 130 to 320 mg/dL    Other: (CHADS2VASc if Afib, PHQ9 if depression, MMRC or CAT for COPD, ACT, DEXA)  Social History   Tobacco Use  Smoking Status Never  Smokeless Tobacco Never   BP Readings from Last 3 Encounters:  10/29/21 134/71  08/27/21 129/70  05/15/21 (!) 125/56   Pulse Readings from Last 3 Encounters:  10/29/21 71  08/27/21 68  05/15/21 92   Wt Readings from Last 3 Encounters:  10/03/21 183 lb (83 kg)  08/27/21 183 lb 9.6 oz (83.3 kg)  04/29/21 199 lb 6.4 oz (90.4 kg)    Assessment: Review of patient past medical history, allergies, medications, health status, including review of consultants reports, laboratory and other test data, was performed as part of comprehensive evaluation and provision of chronic care management services.   SDOH:  (Social Determinants of Health) assessments and interventions performed:    CCM Care Plan  Allergies  Allergen Reactions   Sulfamethoxazole-Trimethoprim Hives    Medications Reviewed Today     Reviewed by ,  D, RPH (Pharmacist) on 11/05/21 at 1024  Med List Status: <None>   Medication Order Taking? Sig Documenting Provider Last Dose Status Informant  amLODipine (NORVASC) 5 MG tablet [id number] No TAKE 1 TABLET EVERY DAY [name], Warren, MD Taking Active   aspirin 81 MG tablet [id number] No Take 81 mg by mouth daily. [provider] Taking Active   B-D ULTRAFINE III SHORT PEN 31G X 8 MM MISC [id number] No USE  1  PEN  NEEDLE AS DIRECTED Nida, Gebreselassie W, MD Taking Active   celecoxib (CELEBREX) 400 MG capsule [id number] No Take 1 capsule (400 mg total) by mouth daily. With food [name], Warren, MD Taking Active   Cholecalciferol (VITAMIN D3) 50 MCG (2000 UT) TABS [id number] No Take by mouth. [provider] Taking Active   diclofenac Sodium (VOLTAREN) 1 % GEL [id number] No Apply 4 g topically 4 (four) times daily. For the knee [name], Warren, MD Taking Active    Dulaglutide (TRULICITY) 1.5 MG/0.5ML SOPN [id number] No Inject 1.5 mg into the skin once a week. [name], Warren, MD Taking Active            Med Note (,  D   Wed Jun 05, 2021 11:11 AM) Via [employer] patient assistance program  glucose blood (TRUE METRIX BLOOD GLUCOSE TEST) test strip [id number] No TEST BLOOD SUGAR TWO TO THREE TIMES DAILY Dx E11.65 [name], Warren, MD Taking Active   Insulin Glargine (BASAGLAR KWIKPEN) 100 UNIT/ML [id number] No Inject 30 Units into the skin daily. [name], Warren, MD Taking Active              Med Note Blanca Friend, Magdiel Bartles D   Wed Jun 05, 2021 11:11 AM) Via [employer] [employer] patient assistance program   metFORMIN (GLUCOPHAGE-XR) 500 MG 24 hr tablet [id number] No Take 2 tablets (1,000 mg total) by mouth daily after breakfast. [name], MD Taking Active   omega-3 fish oil (MAXEPA) 1000 MG CAPS capsule [id number] No Take by mouth. [provider] Taking Active   rosuvastatin (CRESTOR) 5 MG tablet [id number] No TAKE 1 TABLET EVERY DAY [name], [name], MD Taking Active   triamcinolone cream (KENALOG) 0.1 % [id number] No Apply 1 application. topically 3 (three) times daily. For the ankle area [name], MD Taking Active   valsartan-hydrochlorothiazide (DIOVAN-HCT) 320-25 MG tablet [id number] No TAKE 1 TABLET EVERY DAY FOR BLOOD PRESSURE [name], MD Taking Active             Patient Active Problem List   Diagnosis Date Noted   At high risk for falls 09/04/2021   Senile osteoporosis 09/04/2021   Closed fracture of right hip ([place]) 05/02/2021   Uncontrolled type 2 diabetes mellitus with hyperglycemia ([place]) 04/07/2019   Intractable hiccups 09/10/2018   Mixed hyperlipidemia 01/19/2017   Class 1 obesity due to excess calories without serious comorbidity with body mass index (BMI) of 32.0 to 32.9 in adult 10/14/2016   Hypertension associated with diabetes ([place]) 03/12/2016   Uncontrolled type 2 diabetes mellitus without complication, without long-term  current use of insulin 05/16/2015   Essential hypertension, benign 05/16/2015   Tinea 11/02/2013   Type 2 diabetes mellitus with neurological complications ([place]) 27/51/7001    Immunization History  Administered Date(s) Administered   Fluad Quad(high Dose 65+) 03/29/2020   Influenza, High Dose Seasonal PF 03/28/2015, 04/07/2016, 04/30/2016, 03/20/2017, 03/17/2018, 03/07/2019   Influenza,inj,Quad PF,6+ Mos 04/07/2016, 04/30/2016, 03/20/2017, 03/17/2018   Moderna Sars-Covid-2 Vaccination 07/06/2019, 08/03/2019, 05/02/2020   Pneumococcal Conjugate-13 06/30/2016   Pneumococcal Polysaccharide-23 07/15/2017   Tdap 12/27/2014   Zoster Recombinat (Shingrix) 01/24/2021, 08/27/2021    Conditions to be addressed/monitored: HLD and DMII  Care Plan : PHARMD MEDICATION MANAGEMENT  Updates made by Lavera Guise, [place] since 11/05/2021 12:00 AM     Problem: DISEASE PROGRESSION PREVENTION   Priority: High     Long-Range Goal: T2DM   Recent Progress: On track  Priority: High  Note:   Current Barriers:  Unable to independently afford treatment regimen Unable to achieve control of T2DM  Suboptimal therapeutic regimen for T2DM  Pharmacist Clinical Goal(s):  Over the next 90 days, patient will verbalize ability to afford treatment regimen achieve control of T2DM as evidenced by GOAL A1C<7%  through collaboration with PharmD and provider.    Interventions: 1:1 collaboration with [name], MD regarding development and update of comprehensive plan of care as evidenced by provider attestation and co-signature Inter-disciplinary care team collaboration (see longitudinal plan of care) Comprehensive medication review performed; medication list updated in electronic medical record  Diabetes: Controlled-A1C 6.6%, GFR improved to 66; current treatment: BASAGLAR 30 units DAILY;  METFORMIN; TRULICITY 7.4BS SQ WEEKLY Patient enrolled IN [facility] [place] until 49/67/59 for  TRULICITY --MEDICATION TO SHIP TO PCP OFFICE DUE TO PHONE NOT ALWAYS WORKING CONTINUE TRULICITY 1.6BW weekly Denies personal and family history of Medullary thyroid cancer (MTC) TOLERATING WELL CONTINUE BASAGLAR 30 UNITS DAILY Current glucose readings: fasting glucose: <135, post prandial glucose: N/A Denies hypoglycemic/hyperglycemic symptoms Discussed meal planning options and Plate method for healthy eating Avoid sugary drinks and desserts Incorporate balanced protein, non starchy veggies, 1 serving of carbohydrate  with each meal Increase water intake Increase physical activity as able Current exercise: N/A Educated on TRULICITY--MEDICATION PURPOSE & SIDE EFFECTS Recommended patient stated blood sugar was starting to increase, but FBG was 109--will continue current management; patient at goal Assessed patient finances. Patient enrolled in [employer] patient assistance program for Trulicity & BASAGLAR FOR 2023  HYPERLIPIDEMIA Discussed statin and compliance--patient at goal   Lipid Panel     Component Value Date/Time   CHOL 113 08/27/2021 1321   TRIG 116 08/27/2021 1321   HDL 40 08/27/2021 1321   CHOLHDL 2.8 08/27/2021 1321   LDLCALC 52 08/27/2021 1321   LABVLDL 21 08/27/2021 1321  Patient Goals/Self-Care Activities Over the next 90 days, patient will:  - take medications as prescribed check glucose DAILY (FASTING), document, and provide at future appointments  Follow Up Plan: Telephone follow up appointment with care management team member scheduled for: 3 MONTHS     Follow Up:  Patient agrees to Care Plan and Follow-up.  Plan: Telephone follow up appointment with care management team member scheduled for:  3 months     [place] , PharmD, BCPS Clinical Pharmacist, [facility] [place]  II Phone [phone]      

## 2021-11-13 DIAGNOSIS — E1169 Type 2 diabetes mellitus with other specified complication: Secondary | ICD-10-CM

## 2021-11-13 DIAGNOSIS — E785 Hyperlipidemia, unspecified: Secondary | ICD-10-CM | POA: Diagnosis not present

## 2021-11-13 DIAGNOSIS — Z7984 Long term (current) use of oral hypoglycemic drugs: Secondary | ICD-10-CM

## 2021-11-13 DIAGNOSIS — Z794 Long term (current) use of insulin: Secondary | ICD-10-CM | POA: Diagnosis not present

## 2021-11-14 DIAGNOSIS — C61 Malignant neoplasm of prostate: Secondary | ICD-10-CM

## 2021-11-14 HISTORY — DX: Malignant neoplasm of prostate: C61

## 2021-11-19 ENCOUNTER — Other Ambulatory Visit: Payer: Medicare HMO | Admitting: Urology

## 2021-11-19 DIAGNOSIS — Z7982 Long term (current) use of aspirin: Secondary | ICD-10-CM | POA: Diagnosis not present

## 2021-11-19 DIAGNOSIS — S72001D Fracture of unspecified part of neck of right femur, subsequent encounter for closed fracture with routine healing: Secondary | ICD-10-CM | POA: Diagnosis not present

## 2021-11-19 DIAGNOSIS — M1711 Unilateral primary osteoarthritis, right knee: Secondary | ICD-10-CM | POA: Diagnosis not present

## 2021-11-25 NOTE — Progress Notes (Signed)
Mr. Maring presents today for ultrasound and biopsy of his prostate.  He has a PSA of 13.2 and a right-sided prostate nodule.  Risks, benefits, and some of the potential complications of a transrectal ultrasounds of the prostate (TRUSP) with biopsies were discussed at length with the patient including gross hematuria, blood in the bowel movements, hematospermia, bacteremia, infection, voiding discomfort, urinary retention, fever, chills, sepsis, blood transfusion, death, and others. All questions were answered. Informed consent was obtained. The patient confirmed that he had taken his pre-procedure antibiotic. All anticoagulants were discontinued prior to the procedure. The patient emptied his bladder. He was positioned in a comfortable left lateral decubitus position with hips and knees acutely flexed.  The rectal probe was inserted into the rectum without difficulty. 10cc of 2% Lidocaine without epinephrine was instilled with a spinal needle using ultrasound guidance near the junction of each seminal vesicle and the prostate.  Sequential transverse (axial) scans were made in small increments beginning at the seminal vesicles and ending at the prostatic apex. Sequential longitudinal (saggital) scans were made in small increments beginning at the right lateral prostate and ending at the left lateral prostate. Excellent anatomical imaging was obtained. The peripheral, transitional, and central zones were well-defined. The seminal vesicles were normal.  Prostate volume 105 ml.  There were no hypoechoic areas. 12 needle core biopsies were performed. 1 biopsy each was taken from the following areas:  Right lateral base, right medial base, right lateral mid prostate, right medial mid prostate, right lateral apical prostate, right medial apical prostate, left lateral base, left medial base, left lateral mid prostate, left medial mid prostate, left lateral apical prostate, left medial apical prostate.. Minimal  prostatic calcifications were noted. Excellent biopsy specimens were obtained.  Follow-up rectal examination was unremarkable. The procedure was well-tolerated and without complications. Antibiotic instructions were given. The patient was told that:  For several days:  he should increase his fluid intake and limit strenuous activity  he might have mild discomfort at the base of his penis or in his rectum  he might have blood in his urine or blood in his bowel movements  For 2-3 months:  he might have blood in his ejaculate (semen)  Instructions were given to call the office immedicately for blood clots in the urine or bowel movements, difficulty urinating, inability to urinate, urinary retention, painful or frequent urination, fever, chills, nausea, vomiting, or other illness. The patient stated that he understood these instructions and would comply with them. We told the patient that prostate biopsy pathology reports are usually available within 3-5 working days, unless a pathologic second opinion is required, which may take 7-14 days. We told him to contact us to check on the status of his biopsy if he has not heard from [place] within 7 days. The patient left the ultrasound examination room in stable condition.

## 2021-11-26 ENCOUNTER — Other Ambulatory Visit: Payer: Self-pay | Admitting: Urology

## 2021-11-26 ENCOUNTER — Ambulatory Visit (HOSPITAL_COMMUNITY)
Admission: RE | Admit: 2021-11-26 | Discharge: 2021-11-26 | Disposition: A | Discharge status: Home / Self Care | Payer: Medicare HMO | Source: Ambulatory Visit | Attending: Urology | Admitting: Urology

## 2021-11-26 ENCOUNTER — Ambulatory Visit: Payer: Medicare HMO | Admitting: Family Medicine

## 2021-11-26 ENCOUNTER — Ambulatory Visit (HOSPITAL_BASED_OUTPATIENT_CLINIC_OR_DEPARTMENT_OTHER): Payer: Medicare HMO | Admitting: Urology

## 2021-11-26 ENCOUNTER — Encounter (HOSPITAL_COMMUNITY): Payer: Self-pay

## 2021-11-26 DIAGNOSIS — R972 Elevated prostate specific antigen [PSA]: Secondary | ICD-10-CM | POA: Insufficient documentation

## 2021-11-26 DIAGNOSIS — C61 Malignant neoplasm of prostate: Secondary | ICD-10-CM | POA: Diagnosis not present

## 2021-11-26 MED ORDER — GENTAMICIN SULFATE 40 MG/ML IJ SOLN: 160.0000 mg | Freq: Once | INTRAMUSCULAR | Status: AC

## 2021-11-26 MED ORDER — LIDOCAINE HCL (PF) 2 % IJ SOLN: 10.0000 mL | Freq: Once | INTRAMUSCULAR | Status: AC

## 2021-11-26 MED ORDER — GENTAMICIN SULFATE 40 MG/ML IJ SOLN
INTRAMUSCULAR | Status: AC
  Administered 2021-11-26: 160 mg via INTRAMUSCULAR
  Filled 2021-11-26: qty 4

## 2021-11-26 MED ORDER — LIDOCAINE HCL (PF) 2 % IJ SOLN
INTRAMUSCULAR | Status: AC
  Administered 2021-11-26: 10 mL
  Filled 2021-11-26: qty 10

## 2021-11-26 NOTE — Progress Notes (Signed)
PT tolerated prostate biopsy procedure and antibiotic injections well today. Labs obtained and sent for pathology. PT ambulatory at discharge with no acute distress noted and verbalized understanding of discharge instructions.  

## 2021-11-29 ENCOUNTER — Other Ambulatory Visit: Payer: Self-pay | Admitting: Urology

## 2021-11-29 DIAGNOSIS — C61 Malignant neoplasm of prostate: Secondary | ICD-10-CM

## 2021-12-05 ENCOUNTER — Encounter (HOSPITAL_COMMUNITY)
Admission: RE | Admit: 2021-12-05 | Discharge: 2021-12-05 | Disposition: A | Discharge status: Home / Self Care | Payer: Medicare HMO | Source: Ambulatory Visit | Attending: Urology | Admitting: Urology

## 2021-12-05 DIAGNOSIS — I2510 Atherosclerotic heart disease of native coronary artery without angina pectoris: Secondary | ICD-10-CM | POA: Diagnosis not present

## 2021-12-05 DIAGNOSIS — I700 Atherosclerosis of aorta: Secondary | ICD-10-CM | POA: Diagnosis not present

## 2021-12-05 DIAGNOSIS — N433 Hydrocele, unspecified: Secondary | ICD-10-CM | POA: Diagnosis not present

## 2021-12-05 DIAGNOSIS — C61 Malignant neoplasm of prostate: Secondary | ICD-10-CM | POA: Insufficient documentation

## 2021-12-05 MED ORDER — PIFLIFOLASTAT F 18 (PYLARIFY) INJECTION
9.0000 | Freq: Once | INTRAVENOUS | Status: AC
  Administered 2021-12-05: 8.87 via INTRAVENOUS

## 2021-12-12 ENCOUNTER — Telehealth: Payer: Self-pay

## 2021-12-12 NOTE — Telephone Encounter (Signed)
Patient called this mooring and wanted to know if you could call him today to discuss the results of his recent PET Scan.     Thank you.

## 2021-12-18 ENCOUNTER — Telehealth: Payer: Self-pay

## 2021-12-18 NOTE — Telephone Encounter (Signed)
Patient called this morning and wanted to know if you could call him today to discuss the results of his recent PET Scan.        Thank you.

## 2021-12-19 ENCOUNTER — Telehealth: Payer: Self-pay

## 2021-12-19 NOTE — Telephone Encounter (Signed)
-----   Message from Franchot Gallo, MD sent at 12/19/2021  4:04 PM EDT ----- Please call pt--good news PET scan showed no evidence of cancer outside of prostate--I will set up an appt for him to see radiation doc in [place] or [place]--whichever he would prefer ----- Message ----- From: Audie Box, CMA Sent: 12/09/2021   8:50 AM EDT To: Franchot Gallo, MD  Please review.

## 2021-12-19 NOTE — Telephone Encounter (Signed)
Called to inform patient he is asking if that can be set up with a doctor in [place].

## 2021-12-23 ENCOUNTER — Other Ambulatory Visit: Payer: Self-pay | Admitting: Urology

## 2021-12-23 DIAGNOSIS — C61 Malignant neoplasm of prostate: Secondary | ICD-10-CM

## 2021-12-24 ENCOUNTER — Telehealth: Payer: Self-pay | Admitting: Radiation Oncology

## 2021-12-24 ENCOUNTER — Telehealth: Payer: Self-pay

## 2021-12-24 NOTE — Telephone Encounter (Signed)
Yes, patient would like to be referred to [facility] in [place].

## 2021-12-24 NOTE — Telephone Encounter (Signed)
Patient requested apt with Dr. Diona Fanti to go over his questions about his plan of care in more detail.  Scheduled for next available visit and sent a letter reminding patient of upcoming apt.

## 2021-12-24 NOTE — Telephone Encounter (Signed)
The [facility] in [place] contact us advising they were closing out the referral, due to patient advising he wished to be referred to the [facility]. Patient advised he was more familiar with the area. I wanted to see if you could place the referral for the cancer center in [place].      Thank you!

## 2021-12-24 NOTE — Telephone Encounter (Signed)
7/11 @ 10:25 am called patient no answer/just rings could not leave voicemail.  Will try back later.

## 2021-12-24 NOTE — Telephone Encounter (Signed)
Patient has been made aware.

## 2021-12-24 NOTE — Telephone Encounter (Signed)
See below

## 2021-12-31 ENCOUNTER — Ambulatory Visit (INDEPENDENT_AMBULATORY_CARE_PROVIDER_SITE_OTHER): Payer: Medicare HMO | Admitting: Pharmacist

## 2021-12-31 ENCOUNTER — Ambulatory Visit (INDEPENDENT_AMBULATORY_CARE_PROVIDER_SITE_OTHER): Payer: Medicare HMO | Admitting: Urology

## 2021-12-31 VITALS — BP 123/72 | HR 67

## 2021-12-31 DIAGNOSIS — E1149 Type 2 diabetes mellitus with other diabetic neurological complication: Secondary | ICD-10-CM

## 2021-12-31 DIAGNOSIS — C61 Malignant neoplasm of prostate: Secondary | ICD-10-CM | POA: Diagnosis not present

## 2021-12-31 DIAGNOSIS — E782 Mixed hyperlipidemia: Secondary | ICD-10-CM

## 2021-12-31 LAB — URINALYSIS, ROUTINE W REFLEX MICROSCOPIC
Bilirubin, UA: NEGATIVE
Glucose, UA: NEGATIVE
Leukocytes,UA: NEGATIVE
Nitrite, UA: NEGATIVE
RBC, UA: NEGATIVE
Specific Gravity, UA: >1.03 — ABNORMAL HIGH (ref 1.005–1.030)
Urobilinogen, Ur: 0.2 mg/dL (ref 0.2–1.0)
pH, UA: 5 (ref 5.0–7.5)

## 2021-12-31 LAB — MICROSCOPIC EXAMINATION
Bacteria, UA: NONE SEEN
RBC, Urine: NONE SEEN /hpf (ref 0–2)
Renal Epithel, UA: NONE SEEN /hpf
WBC, UA: NONE SEEN /hpf (ref 0–5)

## 2021-12-31 NOTE — Progress Notes (Signed)
HPI: Patrick Moss is here today to discuss recent diagnosis of prostate cancer.  6.13.2023: He underwent ultrasound and biopsy of the prostate.  PSA 13.2, prostate volume 105 mL, PSA density 0.13.  Biopsy results as follows:                                                      Perineural invasion was found in 4 of the cores.  PET CT scan was performed on 6.22.2023.  This revealed no evidence of extra prostatic disease.  7.18.2023: Here today to discuss his PET scan and biopsy results.  He has been doing well since that time.  IPSS 5, quality-of-life score 1.  Past Medical History:  Diagnosis Date   Diabetes mellitus without complication ([place])    Hyperlipidemia    Hypertension     Past Surgical History:  Procedure Laterality Date   R foot fx      Home Medications:  Allergies as of 12/31/2021       Reactions   Sulfamethoxazole-trimethoprim Hives        Medication List        Accurate as of December 31, 2021  9:31 AM. If you have any questions, ask your nurse or doctor.          amLODipine 5 MG tablet Commonly known as: NORVASC TAKE 1 TABLET EVERY DAY   aspirin 81 MG tablet Take 81 mg by mouth daily.   B-D ULTRAFINE III SHORT PEN 31G X 8 MM Misc Generic drug: Insulin Pen Needle USE  1  PEN  NEEDLE AS DIRECTED   Basaglar KwikPen 100 UNIT/ML Inject 30 Units into the skin daily.   celecoxib 400 MG capsule Commonly known as: CELEBREX Take 1 capsule (400 mg total) by mouth daily. With food   diclofenac Sodium 1 % Gel Commonly known as: Voltaren Apply 4 g topically 4 (four) times daily. For the knee   metFORMIN 500 MG 24 hr tablet Commonly known as: GLUCOPHAGE-XR Take 2 tablets (1,000 mg total) by mouth daily after breakfast.   omega-3 fish oil 1000 MG Caps capsule Commonly known as: MAXEPA Take by mouth.   rosuvastatin 5 MG tablet Commonly known as: CRESTOR TAKE 1 TABLET EVERY DAY   triamcinolone cream 0.1 % Commonly known as: KENALOG Apply 1  application. topically 3 (three) times daily. For the ankle area   True Metrix Blood Glucose Test test strip Generic drug: glucose blood TEST BLOOD SUGAR TWO TO THREE TIMES DAILY Dx Q00.86   Trulicity 1.5 PY/1.9JK Sopn Generic drug: Dulaglutide Inject 1.5 mg into the skin once a week.   valsartan-hydrochlorothiazide 320-25 MG tablet Commonly known as: DIOVAN-HCT TAKE 1 TABLET EVERY DAY FOR BLOOD PRESSURE   Vitamin D3 50 MCG (2000 UT) Tabs Take by mouth.        Allergies:  Allergies  Allergen Reactions   Sulfamethoxazole-Trimethoprim Hives    Family History  Problem Relation Age of Onset   Diabetes Mother    Diabetes Father    Diabetes Brother     Social History:  reports that he has never smoked. He has never used smokeless tobacco. He reports that he does not drink alcohol and does not use drugs.  ROS: A complete review of systems was performed.  All systems are negative except for pertinent findings as noted.  Physical  Exam:  Vital signs in last 24 hours: There were no vitals taken for this visit. Constitutional:  Alert and oriented, No acute distress Cardiovascular: Regular rate  Respiratory: Normal respiratory effort GI: Abdomen is soft, nontender, nondistended, no abdominal masses. No CVAT.  Genitourinary: Normal male phallus, testes are descended bilaterally and non-tender and without masses, scrotum is normal in appearance without lesions or masses, perineum is normal on inspection. Lymphatic: No lymphadenopathy Neurologic: Grossly intact, no focal deficits Psychiatric: Normal mood and affect  I have reviewed prior pt notes  I have reviewed urinalysis results  I have independently reviewed prior imaging--PET/CT  I have reviewed prior PSA results  Biopsy results reviewed   Impression/Assessment:  Large volume grade group 3 prostate cancer.  Negative PET scan recently.  Plan:  I discussed prostate cancer with him, his grade group, and reviewed  overall treatment.  I do not think is a good candidate for brachytherapy due to his high volume grade group 3 disease.  I do think he is a good candidate for external beam radiotherapy.  He is closer to [place] and then he is [place].  We will see about getting him set up to see the radiation oncology department at [facility].

## 2021-12-31 NOTE — Addendum Note (Signed)
Addended by: Audie Box on: 12/31/2021 02:12 PM   Modules accepted: Orders

## 2022-01-02 NOTE — Progress Notes (Signed)
[facility] [place] [facility] Note  12/31/2021 Name:  Patrick Moss MRN:  [id number] DOB:  [birth year]  Summary:  Diabetes: Controlled-A1C 6.6%, GFR 62; current treatment: BASAGLAR 30 units DAILY;  METFORMIN; TRULICITY 5.0DT SQ WEEKLY Patient enrolled IN [employer] PATIENT ASSISTANCE PROGRAM until 26/71/24 for TRULICITY --MEDICATION TO SHIP TO PCP OFFICE DUE TO PHONE NOT ALWAYS WORKING CONTINUE TRULICITY 5.8KD weekly Denies personal and family history of Medullary thyroid cancer (MTC) TOLERATING WELL CONTINUE BASAGLAR 30 UNITS DAILY Shipment arrived today CONSIDER ADDING SGLT2 AT F/U Current glucose readings: fasting glucose: <135, post prandial glucose: N/A Denies hypoglycemic/hyperglycemic symptoms Discussed meal planning options and Plate method for healthy eating Avoid sugary drinks and desserts Incorporate balanced protein, non starchy veggies, 1 serving of carbohydrate with each meal Increase water intake Increase physical activity as able Current exercise: N/A Educated on TRULICITY--MEDICATION PURPOSE & SIDE EFFECTS Recommended patient stated blood sugar was starting to increase, but FBG was 109--will continue current management; patient at goal Assessed patient finances. Patient enrolled in [employer] patient assistance program for Trulicity & BASAGLAR FOR 2023  HYPERLIPIDEMIA Discussed statin and compliance--patient at goal   Lipid Panel     Component Value Date/Time   CHOL 113 08/27/2021 1321   TRIG 116 08/27/2021 1321   HDL 40 08/27/2021 1321   CHOLHDL 2.8 08/27/2021 1321   LDLCALC 52 08/27/2021 1321   LABVLDL 21 08/27/2021 1321   Patient Goals/Self-Care Activities Over the next 90 days, patient will:  - take medications as prescribed check glucose DAILY (FASTING), document, and provide at future appointments  Follow Up Plan: Telephone follow up appointment with care management team member scheduled for: 3 MONTHS  Subjective: Patrick Moss is  an 81 y.o. year old male who is a primary patient of [name], [name], MD.  The CCM team was consulted for assistance with disease management and care coordination needs.    Engaged with patient face to face for follow up visit in response to provider referral for pharmacy case management and/or care coordination services.   Consent to Services:  The patient was given information about Chronic Care Management services, agreed to services, and gave verbal consent prior to initiation of services.  Please see initial visit note for detailed documentation.   Patient Care Team: [name], MD as PCP - General (Family Medicine) Cassandria Anger, MD as Consulting Physician (Endocrinology) Katy Fitch, Darlina Guys, MD as Consulting Physician (Ophthalmology) Lavera Guise, [facility] as Pharmacist (Family Medicine) Pilson, [name], MD (Orthopedic Surgery)  Objective:  Lab Results  Component Value Date   CREATININE 1.19 08/27/2021   CREATININE 1.43 (H) 04/29/2021   CREATININE 1.53 (H) 01/24/2021    Lab Results  Component Value Date   HGBA1C 6.6 (H) 08/27/2021   Last diabetic Eye exam:  Lab Results  Component Value Date/Time   HMDIABEYEEXA No Retinopathy 09/11/2020 12:00 AM    Last diabetic Foot exam: No results found for: "HMDIABFOOTEX"      Component Value Date/Time   CHOL 113 08/27/2021 1321   TRIG 116 08/27/2021 1321   HDL 40 08/27/2021 1321   CHOLHDL 2.8 08/27/2021 1321   LDLCALC 52 08/27/2021 1321       Latest Ref Rng & Units 08/27/2021    1:21 PM 04/29/2021    9:06 AM 01/24/2021    9:02 AM  Hepatic Function  Total Protein 6.0 - 8.5 g/dL 7.0  7.0  7.1   Albumin 3.7 - 4.7 g/dL 4.6  4.7  4.7   AST 0 - 40 IU/L _0 ALT 0 - 44 IU/L _1 Alk Phosphatase 44 - 121 IU/L 104  84  86   Total Bilirubin 0.0 - 1.2 mg/dL 0.5  0.4  0.6     Lab Results  Component Value Date/Time   TSH 3.610 12/02/2019 09:10 AM   TSH 3.450 08/29/2019 09:45 AM   FREET4 1.41  12/02/2019 09:10 AM   FREET4 1.27 08/29/2019 09:45 AM       Latest Ref Rng & Units 08/27/2021    1:21 PM 04/29/2021    9:06 AM 01/24/2021    9:02 AM  CBC  WBC 3.4 - 10.8 x10E3/uL 8.5  7.3  6.8   Hemoglobin 13.0 - 17.7 g/dL 15.7  15.9  14.9   Hematocrit 37.5 - 51.0 % 47.0  46.6  43.2   Platelets 150 - 450 x10E3/uL 270  140  246     Lab Results  Component Value Date/Time   VD25OH 40.4 07/26/2020 09:01 AM   VD25OH 19.4 (L) 03/29/2020 09:48 AM    Clinical ASCVD: No  The ASCVD Risk score (Arnett DK, et al., 2019) failed to calculate for the following reasons:   The valid total cholesterol range is 130 to 320 mg/dL    Other: (CHADS2VASc if Afib, PHQ9 if depression, MMRC or CAT for COPD, ACT, DEXA)  Social History   Tobacco Use  Smoking Status Never  Smokeless Tobacco Never   BP Readings from Last 3 Encounters:  12/31/21 123/72  11/26/21 140/79  10/29/21 134/71   Pulse Readings from Last 3 Encounters:  12/31/21 67  11/26/21 65  10/29/21 71   Wt Readings from Last 3 Encounters:  10/03/21 183 lb (83 kg)  08/27/21 183 lb 9.6 oz (83.3 kg)  04/29/21 199 lb 6.4 oz (90.4 kg)    Assessment: Review of patient past medical history, allergies, medications, health status, including review of consultants reports, laboratory and other test data, was performed as part of comprehensive evaluation and provision of chronic care management services.   SDOH:  (Social Determinants of Health) assessments and interventions performed:    CCM Care Plan  Allergies  Allergen Reactions   Sulfamethoxazole-Trimethoprim Hives    Medications Reviewed Today     Reviewed by Lavera Guise, [facility] (Pharmacist) on 01/02/22 at 1233  Med List Status: <None>   Medication Order Taking? Sig Documenting Provider Last Dose Status Informant  amLODipine (NORVASC) 5 MG tablet [id number] No TAKE 1 TABLET EVERY DAY [name], [name], MD Taking Active   aspirin 81 MG tablet [id number] No Take 81 mg by mouth  daily. [provider] Taking Active   B-D ULTRAFINE III SHORT PEN 31G X 8 MM MISC [id number] No USE  1  PEN  NEEDLE AS DIRECTED Nida, Marella Chimes, MD Taking Active   calcium carbonate (OSCAL) 1500 (600 Ca) MG TABS tablet [id number] No Take by mouth 2 (two) times daily with a meal. [provider] Taking Active   celecoxib (CELEBREX) 400 MG capsule [id number] No Take 1 capsule (400 mg total) by mouth daily. With food [name], MD Taking Active   Cholecalciferol (VITAMIN D3) 50 MCG (2000 UT) TABS [id number] No Take by mouth. [provider] Taking Active   diclofenac Sodium (VOLTAREN) 1 % GEL [id number] No Apply 4 g topically 4 (four) times daily. For the knee [name], MD Taking Active   Dulaglutide (TRULICITY) 1.5 LG/9.2JJ [facility]  [id number] No Inject 1.5 mg into the skin once a week. [name], MD Taking Active            Med Note Blanca Friend, Royce Macadamia   Wed Jun 05, 2021 11:11 AM) Via [employer] patient assistance program  glucose blood (TRUE METRIX BLOOD GLUCOSE TEST) test strip [id number] No TEST BLOOD SUGAR TWO TO THREE TIMES DAILY Dx E11.65 [name], MD Taking Active   Insulin Glargine [facility] KWIKPEN) 100 UNIT/ML [id number] No Inject 30 Units into the skin daily. [name], MD Taking Active            Med Note Blanca Friend, Royce Macadamia   Wed Jun 05, 2021 11:11 AM) Via [employer] patient assistance program   metFORMIN (GLUCOPHAGE-XR) 500 MG 24 hr tablet [id number] No Take 2 tablets (1,000 mg total) by mouth daily after breakfast. [name], MD Taking Active   omega-3 fish oil (MAXEPA) 1000 MG CAPS capsule [id number] No Take by mouth. [provider] Taking Active   rosuvastatin (CRESTOR) 5 MG tablet [id number] No TAKE 1 TABLET EVERY DAY [name], [name], MD Taking Active   triamcinolone cream (KENALOG) 0.1 % [id number] No Apply 1 application. topically 3 (three) times daily. For the ankle area [name], MD Taking Active    valsartan-hydrochlorothiazide (DIOVAN-HCT) 320-25 MG tablet [id number] No TAKE 1 TABLET EVERY DAY FOR BLOOD PRESSURE [name], MD Taking Active             Patient Active Problem List   Diagnosis Date Noted   At high risk for falls 09/04/2021   Senile osteoporosis 09/04/2021   Closed fracture of right hip ([place]) 05/02/2021   Uncontrolled type 2 diabetes mellitus with hyperglycemia ([place]) 04/07/2019   Intractable hiccups 09/10/2018   Mixed hyperlipidemia 01/19/2017   Class 1 obesity due to excess calories without serious comorbidity with body mass index (BMI) of 32.0 to 32.9 in adult 10/14/2016   Hypertension associated with diabetes ([place]) 03/12/2016   Uncontrolled type 2 diabetes mellitus without complication, without long-term current use of insulin 05/16/2015   Essential hypertension, benign 05/16/2015   Tinea 11/02/2013   Type 2 diabetes mellitus with neurological complications ([place]) 57/06/7791    Immunization History  Administered Date(s) Administered   Fluad Quad(high Dose 65+) 03/29/2020   Influenza, High Dose Seasonal PF 03/28/2015, 04/07/2016, 04/30/2016, 03/20/2017, 03/17/2018, 03/07/2019   Influenza,inj,Quad PF,6+ Mos 04/07/2016, 04/30/2016, 03/20/2017, 03/17/2018   Moderna Sars-Covid-2 Vaccination 07/06/2019, 08/03/2019, 05/02/2020   Pneumococcal Conjugate-13 06/30/2016   Pneumococcal Polysaccharide-23 07/15/2017   Tdap 12/27/2014   Zoster Recombinat (Shingrix) 01/24/2021, 08/27/2021    Conditions to be addressed/monitored: HLD and DMII  Care Plan : PHARMD MEDICATION MANAGEMENT  Updates made by Lavera Guise, RPH since 01/02/2022 12:00 AM     Problem: DISEASE PROGRESSION PREVENTION   Priority: High     Long-Range Goal: T2DM   Recent Progress: On track  Priority: High  Note:   Current Barriers:  Unable to independently afford treatment regimen Unable to achieve control of T2DM  Suboptimal therapeutic regimen for T2DM  Pharmacist Clinical  Goal(s):  Over the next 90 days, patient will verbalize ability to afford treatment regimen achieve control of T2DM as evidenced by GOAL A1C<7%  through collaboration with PharmD and provider.    Interventions: 1:1 collaboration with [name], MD regarding development and update of comprehensive plan of care as evidenced by provider attestation and co-signature Inter-disciplinary care team collaboration (see longitudinal plan of care) Comprehensive medication review performed; medication list updated in  electronic medical record  Diabetes: Controlled-A1C 6.6%, GFR 62; current treatment: BASAGLAR 30 units DAILY;  METFORMIN; TRULICITY 3.2PQ SQ WEEKLY Patient enrolled IN [employer] PATIENT ASSISTANCE PROGRAM until 98/26/41 for TRULICITY --MEDICATION TO SHIP TO PCP OFFICE DUE TO PHONE NOT ALWAYS WORKING CONTINUE TRULICITY 5.8XE weekly Denies personal and family history of Medullary thyroid cancer (MTC) TOLERATING WELL CONTINUE BASAGLAR 30 UNITS DAILY Shipment arrived today CONSIDER ADDING SGLT2 AT F/U Current glucose readings: fasting glucose: <135, post prandial glucose: N/A Denies hypoglycemic/hyperglycemic symptoms Discussed meal planning options and Plate method for healthy eating Avoid sugary drinks and desserts Incorporate balanced protein, non starchy veggies, 1 serving of carbohydrate with each meal Increase water intake Increase physical activity as able Current exercise: N/A Educated on TRULICITY--MEDICATION PURPOSE & SIDE EFFECTS Recommended patient stated blood sugar was starting to increase, but FBG was 109--will continue current management; patient at goal Assessed patient finances. Patient enrolled in [employer] patient assistance program for Trulicity & BASAGLAR FOR 2023  HYPERLIPIDEMIA Discussed statin and compliance--patient at goal   Lipid Panel     Component Value Date/Time   CHOL 113 08/27/2021 1321   TRIG 116 08/27/2021 1321   HDL 40 08/27/2021 1321    CHOLHDL 2.8 08/27/2021 1321   LDLCALC 52 08/27/2021 1321   LABVLDL 21 08/27/2021 1321  Patient Goals/Self-Care Activities Over the next 90 days, patient will:  - take medications as prescribed check glucose DAILY (FASTING), document, and provide at future appointments  Follow Up Plan: Telephone follow up appointment with care management team member scheduled for: 3 MONTHS     Medication Assistance:  BASAGLAR/TRULICITY obtained through [place] medication assistance program.  Enrollment ends 06/15/22  Follow Up:  Patient agrees to Care Plan and Follow-up.  Plan: Telephone follow up appointment with care management team member scheduled for:  3 MONTHS   Regina Eck, PharmD, BCPS Clinical Pharmacist, [place]  II Phone [phone]

## 2022-01-02 NOTE — Patient Instructions (Addendum)
Visit Information  Following are the goals we discussed today:  Current Barriers:  Unable to independently afford treatment regimen Unable to achieve control of T2DM  Suboptimal therapeutic regimen for T2DM  Pharmacist Clinical Goal(s):  Over the next 90 days, patient will verbalize ability to afford treatment regimen achieve control of T2DM as evidenced by GOAL A1C<7% through collaboration with PharmD and provider.    Interventions: 1:1 collaboration with [name], MD regarding development and update of comprehensive plan of care as evidenced by provider attestation and co-signature Inter-disciplinary care team collaboration (see longitudinal plan of care) Comprehensive medication review performed; medication list updated in electronic medical record  Diabetes: Controlled-A1C 6.6%, GFR 62; current treatment: BASAGLAR 30 units DAILY;  METFORMIN; TRULICITY 1.'5MG'$  SQ WEEKLY Patient enrolled IN [employer] PATIENT ASSISTANCE PROGRAM until 28/41/32 for TRULICITY --MEDICATION TO SHIP TO PCP OFFICE DUE TO PHONE NOT ALWAYS WORKING CONTINUE TRULICITY 1.'5mg'$  weekly Denies personal and family history of Medullary thyroid cancer (MTC) TOLERATING WELL CONTINUE BASAGLAR 30 UNITS DAILY Shipment arrived today CONSIDER ADDING SGLT2 AT F/U Current glucose readings: fasting glucose: <135, post prandial glucose: N/A Denies hypoglycemic/hyperglycemic symptoms Discussed meal planning options and Plate method for healthy eating Avoid sugary drinks and desserts Incorporate balanced protein, non starchy veggies, 1 serving of carbohydrate with each meal Increase water intake Increase physical activity as able Current exercise: N/A Educated on [place] EFFECTS Recommended patient stated blood sugar was starting to increase, but FBG was 109--will continue current management; patient at goal Assessed patient finances. Patient enrolled in [employer] patient assistance program  for Trulicity & BASAGLAR FOR 2023  HYPERLIPIDEMIA Discussed statin and compliance--patient at goal   Lipid Panel     Component Value Date/Time   CHOL 113 08/27/2021 1321   TRIG 116 08/27/2021 1321   HDL 40 08/27/2021 1321   CHOLHDL 2.8 08/27/2021 1321   LDLCALC 52 08/27/2021 1321   LABVLDL 21 08/27/2021 1321   Patient Goals/Self-Care Activities Over the next 90 days, patient will:  - take medications as prescribed check glucose DAILY (FASTING), document, and provide at future appointments  Follow Up Plan: Telephone follow up appointment with care management team member scheduled for: 3 MONTHS  Plan: Telephone follow up appointment with care management team member scheduled for:  3 months  Signature Regina Eck, PharmD, BCPS Clinical Pharmacist, [place]  II Phone [phone]   Please call the care guide team at [phone] if you need to cancel or reschedule your appointment.   The patient verbalized understanding of instructions, educational materials, and care plan provided today and DECLINED offer to receive copy of patient instructions, educational materials, and care plan.

## 2022-01-03 ENCOUNTER — Telehealth: Payer: Self-pay | Admitting: Pharmacist

## 2022-01-03 NOTE — Telephone Encounter (Signed)
Trulicity 1.'5mg'$  weekly #4boxes (4 month supply) Arrived at PCP office Patient to pick up today

## 2022-01-09 ENCOUNTER — Encounter: Payer: Self-pay | Admitting: Family Medicine

## 2022-01-09 ENCOUNTER — Ambulatory Visit (INDEPENDENT_AMBULATORY_CARE_PROVIDER_SITE_OTHER): Payer: Medicare HMO | Admitting: Family Medicine

## 2022-01-09 VITALS — BP 128/63 | HR 62 | Temp 97.2°F | Ht 66.0 in | Wt 181.8 lb

## 2022-01-09 DIAGNOSIS — E1149 Type 2 diabetes mellitus with other diabetic neurological complication: Secondary | ICD-10-CM | POA: Diagnosis not present

## 2022-01-09 DIAGNOSIS — E1159 Type 2 diabetes mellitus with other circulatory complications: Secondary | ICD-10-CM

## 2022-01-09 DIAGNOSIS — I152 Hypertension secondary to endocrine disorders: Secondary | ICD-10-CM | POA: Diagnosis not present

## 2022-01-09 DIAGNOSIS — I10 Essential (primary) hypertension: Secondary | ICD-10-CM | POA: Diagnosis not present

## 2022-01-09 DIAGNOSIS — M47816 Spondylosis without myelopathy or radiculopathy, lumbar region: Secondary | ICD-10-CM | POA: Diagnosis not present

## 2022-01-09 DIAGNOSIS — E782 Mixed hyperlipidemia: Secondary | ICD-10-CM | POA: Diagnosis not present

## 2022-01-09 LAB — BAYER DCA HB A1C WAIVED: HB A1C (BAYER DCA - WAIVED): 6.1 % — ABNORMAL HIGH (ref 4.8–5.6)

## 2022-01-09 MED ORDER — ROSUVASTATIN CALCIUM 5 MG PO TABS: 5.0000 mg | ORAL_TABLET | Freq: Every day | ORAL | 3 refills | Status: DC

## 2022-01-09 MED ORDER — AMLODIPINE BESYLATE 5 MG PO TABS: 5.0000 mg | ORAL_TABLET | Freq: Every day | ORAL | 3 refills | Status: DC

## 2022-01-09 MED ORDER — CELECOXIB 400 MG PO CAPS: 400.0000 mg | ORAL_CAPSULE | Freq: Every day | ORAL | 3 refills | Status: DC

## 2022-01-09 MED ORDER — VALSARTAN-HYDROCHLOROTHIAZIDE 320-25 MG PO TABS: 1.0000 | ORAL_TABLET | Freq: Every day | ORAL | 3 refills | Status: DC

## 2022-01-09 NOTE — Progress Notes (Signed)
Subjective:  Patient ID: Patrick Moss,  male    DOB: [birth year]  Age: 81 y.o.    CC: Medical Management of Chronic Issues   HPI Patrick Moss presents for  follow-up of hypertension. Patient has no history of headache chest pain or shortness of breath or recent cough. Patient also denies symptoms of TIA such as numbness weakness lateralizing. Patient denies side effects from medication. States taking it regularly.  Patient also  in for follow-up of elevated cholesterol. Doing well without complaints on current medication. Denies side effects  including myalgia and arthralgia and nausea. Also in today for liver function testing. Currently no chest pain, shortness of breath or other cardiovascular related symptoms noted.  Follow-up of diabetes. Patient does check blood sugar at home. Readings run between 90s and 120s. Patient denies symptoms such as excessive hunger or urinary frequency, excessive hunger, nausea No significant hypoglycemic spells noted. Medications reviewed. Pt reports taking them regularly. Pt. denies complication/adverse reaction today.    History Aziz has a past medical history of Diabetes mellitus without complication ([place]), Hyperlipidemia, and Hypertension.   He has a past surgical history that includes R foot fx.   His family history includes Diabetes in his brother, father, and mother.He reports that he has never smoked. He has never used smokeless tobacco. He reports that he does not drink alcohol and does not use drugs.  Current Outpatient Medications on File Prior to Visit  Medication Sig Dispense Refill   amLODipine (NORVASC) 5 MG tablet TAKE 1 TABLET EVERY DAY 90 tablet 0   aspirin 81 MG tablet Take 81 mg by mouth daily.     B-D ULTRAFINE III SHORT PEN 31G X 8 MM MISC USE  1  PEN  NEEDLE AS DIRECTED 270 each 3   calcium carbonate (OSCAL) 1500 (600 Ca) MG TABS tablet Take by mouth 2 (two) times daily with a meal.     celecoxib (CELEBREX) 400 MG  capsule Take 1 capsule (400 mg total) by mouth daily. With food 90 capsule 3   Cholecalciferol (VITAMIN D3) 50 MCG (2000 UT) TABS Take by mouth.     diclofenac Sodium (VOLTAREN) 1 % GEL Apply 4 g topically 4 (four) times daily. For the knee     Dulaglutide (TRULICITY) 1.5 KK/9.3GH SOPN Inject 1.5 mg into the skin once a week. 6 mL 4   glucose blood (TRUE METRIX BLOOD GLUCOSE TEST) test strip TEST BLOOD SUGAR TWO TO THREE TIMES DAILY Dx E11.65 300 each 3   Insulin Glargine (BASAGLAR KWIKPEN) 100 UNIT/ML Inject 30 Units into the skin daily. 45 mL 4   metFORMIN (GLUCOPHAGE-XR) 500 MG 24 hr tablet Take 2 tablets (1,000 mg total) by mouth daily after breakfast. 90 tablet 1   omega-3 fish oil (MAXEPA) 1000 MG CAPS capsule Take by mouth.     rosuvastatin (CRESTOR) 5 MG tablet TAKE 1 TABLET EVERY DAY 90 tablet 0   triamcinolone cream (KENALOG) 0.1 % Apply 1 application. topically 3 (three) times daily. For the ankle area 45 g 0   valsartan-hydrochlorothiazide (DIOVAN-HCT) 320-25 MG tablet TAKE 1 TABLET EVERY DAY FOR BLOOD PRESSURE 90 tablet 0   No current facility-administered medications on file prior to visit.    ROS Review of Systems  Constitutional:  Negative for fever.  Respiratory:  Negative for shortness of breath.   Cardiovascular:  Negative for chest pain.  Musculoskeletal:  Negative for arthralgias.  Skin:  Negative for rash.    Objective:  BP  128/63   Pulse 62   Temp (!) 97.2 F (36.2 C)   Ht 5' 6"  (1.676 m)   Wt 181 lb 12.8 oz (82.5 kg)   SpO2 99%   BMI 29.34 kg/m   BP Readings from Last 3 Encounters:  01/09/22 128/63  12/31/21 123/72  11/26/21 140/79    Wt Readings from Last 3 Encounters:  01/09/22 181 lb 12.8 oz (82.5 kg)  10/03/21 183 lb (83 kg)  08/27/21 183 lb 9.6 oz (83.3 kg)     Physical Exam Vitals reviewed.  Constitutional:      Appearance: He is well-developed.  HENT:     Head: Normocephalic and atraumatic.     Right Ear: External ear normal.      Left Ear: External ear normal.     Mouth/Throat:     Pharynx: No oropharyngeal exudate or posterior oropharyngeal erythema.  Eyes:     Pupils: Pupils are equal, round, and reactive to light.  Cardiovascular:     Rate and Rhythm: Normal rate and regular rhythm.     Heart sounds: No murmur heard. Pulmonary:     Effort: No respiratory distress.     Breath sounds: Normal breath sounds.  Musculoskeletal:     Cervical back: Normal range of motion and neck supple.  Neurological:     Mental Status: He is alert and oriented to person, place, and time.     Diabetic Foot Exam - Simple   No data filed     Lab Results  Component Value Date   HGBA1C 6.6 (H) 08/27/2021   HGBA1C 6.8 (H) 04/29/2021   HGBA1C 7.9 (H) 01/24/2021    Assessment & Plan:   Agastya was seen today for medical management of chronic issues.  Diagnoses and all orders for this visit:  Type 2 diabetes mellitus with neurological complications ([place]) -     Bayer DCA Hb A1c Waived  Mixed hyperlipidemia -     Lipid panel  Essential hypertension, benign -     CBC with Differential/Platelet -     CMP14+EGFR  Hypertension associated with diabetes ([place])  Lumbar spondylosis   I am having Patrick Moss maintain his aspirin, B-D ULTRAFINE III SHORT PEN, Vitamin D3, celecoxib, omega-3 fish oil, Trulicity, Basaglar KwikPen, True Metrix Blood Glucose Test, triamcinolone cream, diclofenac Sodium, valsartan-hydrochlorothiazide, amLODipine, rosuvastatin, metFORMIN, and calcium carbonate.  No orders of the defined types were placed in this encounter.    Follow-up: Return in about 3 months (around 04/11/2022).  [name], M.D.

## 2022-01-10 LAB — CBC WITH DIFFERENTIAL/PLATELET
Basophils Absolute: 0 10*3/uL (ref 0.0–0.2)
Basos: 0 %
EOS (ABSOLUTE): 0.1 10*3/uL (ref 0.0–0.4)
Eos: 2 %
Hematocrit: 45.6 % (ref 37.5–51.0)
Hemoglobin: 15 g/dL (ref 13.0–17.7)
Immature Grans (Abs): 0 10*3/uL (ref 0.0–0.1)
Immature Granulocytes: 0 %
Lymphocytes Absolute: 2.7 10*3/uL (ref 0.7–3.1)
Lymphs: 35 %
MCH: 30.2 pg (ref 26.6–33.0)
MCHC: 32.9 g/dL (ref 31.5–35.7)
MCV: 92 fL (ref 79–97)
Monocytes Absolute: 0.5 10*3/uL (ref 0.1–0.9)
Monocytes: 7 %
Neutrophils Absolute: 4.2 10*3/uL (ref 1.4–7.0)
Neutrophils: 56 %
Platelets: 243 10*3/uL (ref 150–450)
RBC: 4.97 x10E6/uL (ref 4.14–5.80)
RDW: 13.1 % (ref 11.6–15.4)
WBC: 7.5 10*3/uL (ref 3.4–10.8)

## 2022-01-10 LAB — CMP14+EGFR
ALT: 19 IU/L (ref 0–44)
AST: 15 IU/L (ref 0–40)
Albumin/Globulin Ratio: 1.9 (ref 1.2–2.2)
Albumin: 4.6 g/dL (ref 3.8–4.8)
Alkaline Phosphatase: 94 IU/L (ref 44–121)
BUN/Creatinine Ratio: 13 (ref 10–24)
BUN: 17 mg/dL (ref 8–27)
Bilirubin Total: 0.5 mg/dL (ref 0.0–1.2)
CO2: 25 mmol/L (ref 20–29)
Calcium: 10.2 mg/dL (ref 8.6–10.2)
Chloride: 103 mmol/L (ref 96–106)
Creatinine, Ser: 1.26 mg/dL (ref 0.76–1.27)
Globulin, Total: 2.4 g/dL (ref 1.5–4.5)
Glucose: 106 mg/dL — ABNORMAL HIGH (ref 70–99)
Potassium: 4.5 mmol/L (ref 3.5–5.2)
Sodium: 142 mmol/L (ref 134–144)
Total Protein: 7 g/dL (ref 6.0–8.5)
eGFR: 58 mL/min/{1.73_m2} — ABNORMAL LOW (ref >59)

## 2022-01-10 LAB — LIPID PANEL
Chol/HDL Ratio: 2.7 ratio (ref 0.0–5.0)
Cholesterol, Total: 107 mg/dL (ref 100–199)
HDL: 39 mg/dL — ABNORMAL LOW (ref >39)
LDL Chol Calc (NIH): 47 mg/dL (ref 0–99)
Triglycerides: 115 mg/dL (ref 0–149)
VLDL Cholesterol Cal: 21 mg/dL (ref 5–40)

## 2022-01-13 ENCOUNTER — Other Ambulatory Visit: Payer: Self-pay | Admitting: Family Medicine

## 2022-01-13 DIAGNOSIS — E782 Mixed hyperlipidemia: Secondary | ICD-10-CM | POA: Diagnosis not present

## 2022-01-13 DIAGNOSIS — E1149 Type 2 diabetes mellitus with other diabetic neurological complication: Secondary | ICD-10-CM

## 2022-01-13 NOTE — Progress Notes (Signed)
Hello Farren,  Your lab result is normal and/or stable.Some minor variations that are not significant are commonly marked abnormal, but do not represent any medical problem for you.  Best regards, Shenika Quint, M.D.

## 2022-01-14 DIAGNOSIS — C61 Malignant neoplasm of prostate: Secondary | ICD-10-CM | POA: Diagnosis not present

## 2022-02-04 ENCOUNTER — Ambulatory Visit: Payer: Medicare HMO | Admitting: Urology

## 2022-02-04 ENCOUNTER — Encounter: Payer: Self-pay | Admitting: Urology

## 2022-02-04 VITALS — BP 142/86 | HR 87

## 2022-02-04 DIAGNOSIS — C61 Malignant neoplasm of prostate: Secondary | ICD-10-CM

## 2022-02-04 MED ORDER — DEGARELIX ACETATE(240 MG DOSE) 120 MG/VIAL ~~LOC~~ SOLR
240.0000 mg | Freq: Once | SUBCUTANEOUS | Status: AC
  Administered 2022-02-04: 240 mg via SUBCUTANEOUS

## 2022-02-04 NOTE — Progress Notes (Signed)
History of Present Illness:   6.13.2023: He underwent ultrasound and biopsy of the prostate.  PSA 13.2, prostate volume 105 mL, PSA density 0.13.  Biopsy results as follows:    9/12 cores positive-- 1 core  revealed GG 1 pattern 1 core revealed GG 2 pattern 7 cores revealed GG 3 pattern  Perineural invasion was found in 4 of the cores.  PET CT scan was performed on 6.22.2023.  This revealed no evidence of extra prostatic disease.   7.18.2023: Here today to discuss his PET scan and biopsy results.  He has been doing well since that time.  8.1.2023: Met w/ Dr Sharyn Dross @ [facility] for discussion of RT. commended short-term androgen deprivation therapy and EBRT following placement of fiducial markers and SpaceOAR in our [place] office.  Past Medical History:  Diagnosis Date   Diabetes mellitus without complication ([place])    Hyperlipidemia    Hypertension     Past Surgical History:  Procedure Laterality Date   R foot fx      Home Medications:  Allergies as of 02/04/2022       Reactions   Sulfamethoxazole-trimethoprim Hives        Medication List        Accurate as of February 04, 2022  8:28 AM. If you have any questions, ask your nurse or doctor.          amLODipine 5 MG tablet Commonly known as: NORVASC Take 1 tablet (5 mg total) by mouth daily.   aspirin 81 MG tablet Take 81 mg by mouth daily.   B-D ULTRAFINE III SHORT PEN 31G X 8 MM Misc Generic drug: Insulin Pen Needle USE  1  PEN  NEEDLE AS DIRECTED   Basaglar KwikPen 100 UNIT/ML Inject 30 Units into the skin daily.   calcium carbonate 1500 (600 Ca) MG Tabs tablet Commonly known as: OSCAL Take by mouth 2 (two) times daily with a meal.   celecoxib 400 MG capsule Commonly known as: CELEBREX Take 1 capsule (400 mg total) by mouth daily. With food   diclofenac Sodium 1 % Gel Commonly known as: Voltaren Apply 4 g topically 4 (four) times daily. For the knee   metFORMIN 500 MG  24 hr tablet Commonly known as: GLUCOPHAGE-XR TAKE 2 TABLETS EVERY DAY AFTER BREAKFAST   omega-3 fish oil 1000 MG Caps capsule Commonly known as: MAXEPA Take by mouth.   rosuvastatin 5 MG tablet Commonly known as: CRESTOR Take 1 tablet (5 mg total) by mouth daily.   triamcinolone cream 0.1 % Commonly known as: KENALOG Apply 1 application. topically 3 (three) times daily. For the ankle area   True Metrix Blood Glucose Test test strip Generic drug: glucose blood TEST BLOOD SUGAR TWO TO THREE TIMES DAILY Dx P32.95   Trulicity 1.5 JO/8.4ZY Sopn Generic drug: Dulaglutide Inject 1.5 mg into the skin once a week.   valsartan-hydrochlorothiazide 320-25 MG tablet Commonly known as: DIOVAN-HCT Take 1 tablet by mouth daily.   Vitamin D3 50 MCG (2000 UT) Tabs Take by mouth.        Allergies:  Allergies  Allergen Reactions   Sulfamethoxazole-Trimethoprim Hives    Family History  Problem Relation Age of Onset   Diabetes Mother    Diabetes Father    Diabetes Brother     Social History:  reports that he has never smoked. He has never used smokeless tobacco. He reports that he does not drink alcohol and does not use drugs.  ROS:  A complete review of systems was performed.  All systems are negative except for pertinent findings as noted.  Physical Exam:  Vital signs in last 24 hours: There were no vitals taken for this visit. Constitutional:  Alert and oriented, No acute distress Cardiovascular: Regular rate  Respiratory: Normal respiratory effort GI: Abdomen is soft, nontender, nondistended, no abdominal masses. No CVAT.  Genitourinary: Normal male phallus, testes are descended bilaterally and non-tender and without masses, scrotum is normal in appearance without lesions or masses, perineum is normal on inspection. Lymphatic: No lymphadenopathy Neurologic: Grossly intact, no focal deficits Psychiatric: Normal mood and affect  I have reviewed prior pt notes  I have  reviewed notes from referring/previous physicians--Dr. Lynnette Caffey  I have reviewed urinalysis results  I have independently reviewed prior imaging--PET scan, ultrasound  I have reviewed prior PSA results   I have reviewed pathology results   Impression/Assessment:  Grade group 3 adenocarcinoma the prostate.  Scheduled for eventual external beam radiotherapy.  Plan:  I discussed initiating short-term androgen deprivation therapy with the patient and his brother, Abe People.  We will initiate this with Firmagon 240 mg, followed a month later by a 55-monthLupron which should complete his short-term therapy.  Following this, we will schedule him for placement of fiducial markers and SpaceOAR on an outpatient basis under anesthesia in [facility] After that, EBRT will commence

## 2022-02-04 NOTE — Progress Notes (Unsigned)
Firmagon Sub Q Injection  Due to Prostate Cancer patient is present today for a Firmagon Injection.   Medication: Firmagon (Degarelix)  Dose: '240mg'$  Location: right & right upper abdomen  Patient tolerated well, no complications were noted  Performed by: Shanieka Blea LPN  Follow up: Per MD note

## 2022-02-10 ENCOUNTER — Other Ambulatory Visit: Payer: Self-pay | Admitting: Family Medicine

## 2022-02-10 ENCOUNTER — Other Ambulatory Visit: Payer: Self-pay | Admitting: Urology

## 2022-02-10 DIAGNOSIS — M47816 Spondylosis without myelopathy or radiculopathy, lumbar region: Secondary | ICD-10-CM

## 2022-02-11 ENCOUNTER — Encounter (HOSPITAL_BASED_OUTPATIENT_CLINIC_OR_DEPARTMENT_OTHER): Payer: Self-pay | Admitting: Urology

## 2022-02-11 MED ORDER — FLEET ENEMA 7-19 GM/118ML RE ENEM: 1.0000 | ENEMA | Freq: Once | RECTAL | Status: AC

## 2022-02-11 NOTE — Progress Notes (Addendum)
Addendum:  received via fax ekg tracing from 05-02-2021 , placed w/ chart.   Spoke w/ via phone for pre-op interview--- pt Lab needs dos----   [facility]  (if not received current  ekg tracing as requested  via fax from [facility] in [place] , pt will need dos)           Lab results------ ekg dated in care everywhere 05-02-2021 COVID test -----patient states asymptomatic no test needed Arrive at ------- 0715 NPO after MN NO Solid Food.  Clear liquids from MN until--- Med rec completed Medications to take morning of surgery ----- crestor Diabetic medication ----- do not take metformin morning of surgery, do half dose lantus insulin night before surgery, and do not do trulicity Saturday [id number] prior to surgery Patient instructed no nail polish to be worn day of surgery Patient instructed to bring photo id and insurance card day of surgery Patient aware to have Driver (ride ) / caregiver for 24 hours after surgery -- brother, billy Patient Special Instructions ----- will do one fleet enema night before surgery Pre-Op special Istructions ----- pt was given instructions from Dr Diona Fanti office to stopped asa , pt stated last dose 02-10-2022 Patient verbalized understanding of instructions that were given at this phone interview. Patient denies shortness of breath, chest pain, fever, cough at this phone interview.

## 2022-02-14 NOTE — H&P (Signed)
History of Present Illness:   6.13.2023: He underwent ultrasound and biopsy of the prostate.  PSA 13.2, prostate volume 105 mL, PSA density 0.13.  Biopsy results as follows:    9/12 cores positive-- 1 core  revealed GG 1 pattern 1 core revealed GG 2 pattern 7 cores revealed GG 3 pattern  Perineural invasion was found in 4 of the cores.  PET CT scan was performed on 6.22.2023.  This revealed no evidence of extra prostatic disease.   7.18.2023: Here today to discuss his PET scan and biopsy results.  He has been doing well since that time.  8.1.2023: Met w/ Dr David Morris @ [facility] for discussion of RT. commended short-term androgen deprivation therapy and EBRT following placement of fiducial markers and SpaceOAR in our [place] office.  Past Medical History:  Diagnosis Date   Diabetes mellitus without complication (HCC)    Hyperlipidemia    Hypertension     Past Surgical History:  Procedure Laterality Date   R foot fx      Home Medications:  Allergies as of 02/04/2022       Reactions   Sulfamethoxazole-trimethoprim Hives        Medication List        Accurate as of February 04, 2022  8:28 AM. If you have any questions, ask your nurse or doctor.          amLODipine 5 MG tablet Commonly known as: NORVASC Take 1 tablet (5 mg total) by mouth daily.   aspirin 81 MG tablet Take 81 mg by mouth daily.   B-D ULTRAFINE III SHORT PEN 31G X 8 MM Misc Generic drug: Insulin Pen Needle USE  1  PEN  NEEDLE AS DIRECTED   Basaglar KwikPen 100 UNIT/ML Inject 30 Units into the skin daily.   calcium carbonate 1500 (600 Ca) MG Tabs tablet Commonly known as: OSCAL Take by mouth 2 (two) times daily with a meal.   celecoxib 400 MG capsule Commonly known as: CELEBREX Take 1 capsule (400 mg total) by mouth daily. With food   diclofenac Sodium 1 % Gel Commonly known as: Voltaren Apply 4 g topically 4 (four) times daily. For the knee   metFORMIN 500 MG  24 hr tablet Commonly known as: GLUCOPHAGE-XR TAKE 2 TABLETS EVERY DAY AFTER BREAKFAST   omega-3 fish oil 1000 MG Caps capsule Commonly known as: MAXEPA Take by mouth.   rosuvastatin 5 MG tablet Commonly known as: CRESTOR Take 1 tablet (5 mg total) by mouth daily.   triamcinolone cream 0.1 % Commonly known as: KENALOG Apply 1 application. topically 3 (three) times daily. For the ankle area   True Metrix Blood Glucose Test test strip Generic drug: glucose blood TEST BLOOD SUGAR TWO TO THREE TIMES DAILY Dx E11.65   Trulicity 1.5 MG/0.5ML Sopn Generic drug: Dulaglutide Inject 1.5 mg into the skin once a week.   valsartan-hydrochlorothiazide 320-25 MG tablet Commonly known as: DIOVAN-HCT Take 1 tablet by mouth daily.   Vitamin D3 50 MCG (2000 UT) Tabs Take by mouth.        Allergies:  Allergies  Allergen Reactions   Sulfamethoxazole-Trimethoprim Hives    Family History  Problem Relation Age of Onset   Diabetes Mother    Diabetes Father    Diabetes Brother     Social History:  reports that he has never smoked. He has never used smokeless tobacco. He reports that he does not drink alcohol and does not use drugs.  ROS:   A complete review of systems was performed.  All systems are negative except for pertinent findings as noted.  Physical Exam:  Vital signs in last 24 hours: There were no vitals taken for this visit. Constitutional:  Alert and oriented, No acute distress Cardiovascular: Regular rate  Respiratory: Normal respiratory effort GI: Abdomen is soft, nontender, nondistended, no abdominal masses. No CVAT.  Genitourinary: Normal male phallus, testes are descended bilaterally and non-tender and without masses, scrotum is normal in appearance without lesions or masses, perineum is normal on inspection. Lymphatic: No lymphadenopathy Neurologic: Grossly intact, no focal deficits Psychiatric: Normal mood and affect  I have reviewed prior pt notes  I have  reviewed notes from referring/previous physicians--Dr. Morris  I have reviewed urinalysis results  I have independently reviewed prior imaging--PET scan, ultrasound  I have reviewed prior PSA results   I have reviewed pathology results   Impression/Assessment:  Grade group 3 adenocarcinoma the prostate.  Scheduled for eventual external beam radiotherapy.  Plan:  I discussed initiating short-term androgen deprivation therapy with the patient and his brother, Billy.  We will initiate this with Firmagon 240 mg, followed a month later by a 6-month Lupron which should complete his short-term therapy.  Following this, we will schedule him for placement of fiducial markers and SpaceOAR on an outpatient basis under anesthesia in [place]  After that, EBRT will commence  

## 2022-02-18 ENCOUNTER — Encounter (HOSPITAL_BASED_OUTPATIENT_CLINIC_OR_DEPARTMENT_OTHER)
Admission: RE | Disposition: A | Discharge status: Home / Self Care | Payer: Self-pay | Source: Home / Self Care | Attending: Urology

## 2022-02-18 ENCOUNTER — Ambulatory Visit (HOSPITAL_BASED_OUTPATIENT_CLINIC_OR_DEPARTMENT_OTHER)
Admission: RE | Admit: 2022-02-18 | Discharge: 2022-02-18 | Disposition: A | Discharge status: Home / Self Care | Payer: Medicare HMO | Attending: Urology | Admitting: Urology

## 2022-02-18 ENCOUNTER — Ambulatory Visit (HOSPITAL_BASED_OUTPATIENT_CLINIC_OR_DEPARTMENT_OTHER): Payer: Medicare HMO | Admitting: Certified Registered Nurse Anesthetist

## 2022-02-18 ENCOUNTER — Encounter (HOSPITAL_BASED_OUTPATIENT_CLINIC_OR_DEPARTMENT_OTHER): Payer: Self-pay | Admitting: Urology

## 2022-02-18 ENCOUNTER — Other Ambulatory Visit: Payer: Self-pay

## 2022-02-18 DIAGNOSIS — Z7984 Long term (current) use of oral hypoglycemic drugs: Secondary | ICD-10-CM | POA: Diagnosis not present

## 2022-02-18 DIAGNOSIS — Z794 Long term (current) use of insulin: Secondary | ICD-10-CM | POA: Diagnosis not present

## 2022-02-18 DIAGNOSIS — Z7985 Long-term (current) use of injectable non-insulin antidiabetic drugs: Secondary | ICD-10-CM | POA: Diagnosis not present

## 2022-02-18 DIAGNOSIS — N289 Disorder of kidney and ureter, unspecified: Secondary | ICD-10-CM | POA: Diagnosis not present

## 2022-02-18 DIAGNOSIS — C61 Malignant neoplasm of prostate: Secondary | ICD-10-CM | POA: Insufficient documentation

## 2022-02-18 DIAGNOSIS — Z01818 Encounter for other preprocedural examination: Secondary | ICD-10-CM

## 2022-02-18 DIAGNOSIS — M1990 Unspecified osteoarthritis, unspecified site: Secondary | ICD-10-CM | POA: Diagnosis not present

## 2022-02-18 DIAGNOSIS — I10 Essential (primary) hypertension: Secondary | ICD-10-CM | POA: Diagnosis not present

## 2022-02-18 DIAGNOSIS — E119 Type 2 diabetes mellitus without complications: Secondary | ICD-10-CM

## 2022-02-18 HISTORY — DX: Presence of dental prosthetic device (complete) (partial): Z97.2

## 2022-02-18 HISTORY — PX: SPACE OAR INSTILLATION: SHX6769

## 2022-02-18 HISTORY — DX: Preglaucoma, unspecified, bilateral: H40.003

## 2022-02-18 HISTORY — PX: GOLD SEED IMPLANT: SHX6343

## 2022-02-18 HISTORY — DX: Type 2 diabetes mellitus without complications: E11.9

## 2022-02-18 HISTORY — DX: Unspecified osteoarthritis, unspecified site: M19.90

## 2022-02-18 HISTORY — DX: Long term (current) use of insulin: Z79.4

## 2022-02-18 HISTORY — DX: Chronic kidney disease, stage 2 (mild): N18.2

## 2022-02-18 HISTORY — DX: Mixed hyperlipidemia: E78.2

## 2022-02-18 HISTORY — DX: Presence of spectacles and contact lenses: Z97.3

## 2022-02-18 HISTORY — DX: Other complications of anesthesia, initial encounter: T88.59XA

## 2022-02-18 HISTORY — DX: Benign prostatic hyperplasia with lower urinary tract symptoms: N40.1

## 2022-02-18 LAB — POCT I-STAT, CHEM 8
BUN: 21 mg/dL (ref 8–23)
Calcium, Ion: 1.18 mmol/L (ref 1.15–1.40)
Chloride: 106 mmol/L (ref 98–111)
Creatinine, Ser: 1.2 mg/dL (ref 0.61–1.24)
Glucose, Bld: 139 mg/dL — ABNORMAL HIGH (ref 70–99)
HCT: 46 % (ref 39.0–52.0)
Hemoglobin: 15.6 g/dL (ref 13.0–17.0)
Potassium: 4 mmol/L (ref 3.5–5.1)
Sodium: 139 mmol/L (ref 135–145)
TCO2: 24 mmol/L (ref 22–32)

## 2022-02-18 LAB — GLUCOSE, CAPILLARY: Glucose-Capillary: 147 mg/dL — ABNORMAL HIGH (ref 70–99)

## 2022-02-18 SURGERY — INSERTION, GOLD SEEDS
Anesthesia: Monitor Anesthesia Care | Site: Prostate

## 2022-02-18 MED ORDER — PROPOFOL 10 MG/ML IV BOLUS
INTRAVENOUS | Status: AC
  Filled 2022-02-18: qty 20

## 2022-02-18 MED ORDER — SODIUM CHLORIDE (PF) 0.9 % IJ SOLN
INTRAMUSCULAR | Status: DC | PRN
  Administered 2022-02-18: 10 mL

## 2022-02-18 MED ORDER — CEFAZOLIN SODIUM-DEXTROSE 2-4 GM/100ML-% IV SOLN
INTRAVENOUS | Status: AC
  Filled 2022-02-18: qty 100

## 2022-02-18 MED ORDER — ONDANSETRON HCL 4 MG/2ML IJ SOLN
INTRAMUSCULAR | Status: AC
  Filled 2022-02-18: qty 2

## 2022-02-18 MED ORDER — OXYCODONE HCL 5 MG/5ML PO SOLN: 5.0000 mg | Freq: Once | ORAL | Status: DC | PRN

## 2022-02-18 MED ORDER — CEFAZOLIN SODIUM-DEXTROSE 2-4 GM/100ML-% IV SOLN
2.0000 g | INTRAVENOUS | Status: AC
  Administered 2022-02-18: 2 g via INTRAVENOUS

## 2022-02-18 MED ORDER — LIDOCAINE HCL 1 % IJ SOLN
INTRAMUSCULAR | Status: DC | PRN
  Administered 2022-02-18: 10 mL

## 2022-02-18 MED ORDER — FENTANYL CITRATE (PF) 100 MCG/2ML IJ SOLN
INTRAMUSCULAR | Status: AC
  Filled 2022-02-18: qty 2

## 2022-02-18 MED ORDER — AMISULPRIDE (ANTIEMETIC) 5 MG/2ML IV SOLN: 10.0000 mg | Freq: Once | INTRAVENOUS | Status: DC | PRN

## 2022-02-18 MED ORDER — PROPOFOL 10 MG/ML IV BOLUS
INTRAVENOUS | Status: DC | PRN
  Administered 2022-02-18: 20 mg via INTRAVENOUS

## 2022-02-18 MED ORDER — ACETAMINOPHEN 10 MG/ML IV SOLN: 1000.0000 mg | Freq: Once | INTRAVENOUS | Status: DC | PRN

## 2022-02-18 MED ORDER — OXYCODONE HCL 5 MG PO TABS: 5.0000 mg | ORAL_TABLET | Freq: Once | ORAL | Status: DC | PRN

## 2022-02-18 MED ORDER — FENTANYL CITRATE (PF) 100 MCG/2ML IJ SOLN
INTRAMUSCULAR | Status: DC | PRN
  Administered 2022-02-18: 50 ug via INTRAVENOUS

## 2022-02-18 MED ORDER — ACETAMINOPHEN 325 MG PO TABS: 325.0000 mg | ORAL_TABLET | ORAL | Status: DC | PRN

## 2022-02-18 MED ORDER — ONDANSETRON HCL 4 MG/2ML IJ SOLN
INTRAMUSCULAR | Status: DC | PRN
  Administered 2022-02-18: 4 mg via INTRAVENOUS

## 2022-02-18 MED ORDER — LACTATED RINGERS IV SOLN
INTRAVENOUS | Status: DC
Start: 2022-02-18 — End: 2022-02-18

## 2022-02-18 MED ORDER — SODIUM CHLORIDE 0.9 % IV SOLN: INTRAVENOUS | Status: DC

## 2022-02-18 MED ORDER — FENTANYL CITRATE (PF) 100 MCG/2ML IJ SOLN: 25.0000 ug | INTRAMUSCULAR | Status: DC | PRN

## 2022-02-18 MED ORDER — PHENYLEPHRINE 80 MCG/ML (10ML) SYRINGE FOR IV PUSH (FOR BLOOD PRESSURE SUPPORT)
PREFILLED_SYRINGE | INTRAVENOUS | Status: AC
  Filled 2022-02-18: qty 10

## 2022-02-18 MED ORDER — PROPOFOL 500 MG/50ML IV EMUL
INTRAVENOUS | Status: DC | PRN
  Administered 2022-02-18: 200 ug/kg/min via INTRAVENOUS

## 2022-02-18 MED ORDER — ACETAMINOPHEN 160 MG/5ML PO SOLN: 325.0000 mg | ORAL | Status: DC | PRN

## 2022-02-18 MED ORDER — PHENYLEPHRINE 80 MCG/ML (10ML) SYRINGE FOR IV PUSH (FOR BLOOD PRESSURE SUPPORT)
PREFILLED_SYRINGE | INTRAVENOUS | Status: DC | PRN
  Administered 2022-02-18: 80 ug via INTRAVENOUS
  Administered 2022-02-18: 160 ug via INTRAVENOUS
  Administered 2022-02-18 (×2): 80 ug via INTRAVENOUS

## 2022-02-18 SURGICAL SUPPLY — 26 items
BLADE CLIPPER SENSICLIP SURGIC (BLADE) ×1 IMPLANT
CNTNR URN SCR LID CUP LEK RST (MISCELLANEOUS) ×1 IMPLANT
CONT SPEC 4OZ STRL OR WHT (MISCELLANEOUS) ×1
COVER BACK TABLE 60X90IN (DRAPES) ×1 IMPLANT
DRSG TEGADERM 4X4.75 (GAUZE/BANDAGES/DRESSINGS) ×1 IMPLANT
DRSG TEGADERM 8X12 (GAUZE/BANDAGES/DRESSINGS) ×1 IMPLANT
GAUZE SPONGE 4X4 12PLY STRL (GAUZE/BANDAGES/DRESSINGS) ×1 IMPLANT
GAUZE SPONGE 4X4 12PLY STRL LF (GAUZE/BANDAGES/DRESSINGS) IMPLANT
GLOVE BIO SURGEON STRL SZ7.5 (GLOVE) ×1 IMPLANT
GLOVE ECLIPSE 8.0 STRL XLNG CF (GLOVE) ×1 IMPLANT
GLOVE SURG ORTHO 8.5 STRL (GLOVE) ×1 IMPLANT
IMPL SPACEOAR VUE SYSTEM (Spacer) ×1 IMPLANT
IMPLANT SPACEOAR VUE SYSTEM (Spacer) ×1 IMPLANT
KIT TURNOVER CYSTO (KITS) ×1 IMPLANT
MARKER GOLD PRELOAD 1.2X3 (Urological Implant) ×1 IMPLANT
MARKER SKIN DUAL TIP RULER LAB (MISCELLANEOUS) ×1 IMPLANT
NDL SPNL 22GX3.5 QUINCKE BK (NEEDLE) ×1 IMPLANT
NEEDLE SPNL 22GX3.5 QUINCKE BK (NEEDLE) ×1 IMPLANT
SEED GOLD PRELOAD 1.2X3 (Urological Implant) ×1 IMPLANT
SHEATH ULTRASOUND LF (SHEATH) IMPLANT
SHEATH ULTRASOUND LTX NONSTRL (SHEATH) IMPLANT
SURGILUBE 2OZ TUBE FLIPTOP (MISCELLANEOUS) ×1 IMPLANT
SYR 10ML LL (SYRINGE) IMPLANT
SYR CONTROL 10ML LL (SYRINGE) ×1 IMPLANT
TOWEL OR 17X26 10 PK STRL BLUE (TOWEL DISPOSABLE) ×1 IMPLANT
UNDERPAD 30X36 HEAVY ABSORB (UNDERPADS AND DIAPERS) ×1 IMPLANT

## 2022-02-18 NOTE — Anesthesia Preprocedure Evaluation (Signed)
Anesthesia Evaluation  Patient identified by MRN, date of birth, ID band Patient awake    Reviewed: Allergy & Precautions, NPO status , Patient's Chart, lab work & pertinent test results  Airway Mallampati: I  TM Distance: >3 FB Neck ROM: Full    Dental  (+) Partial Upper, Dental Advisory Given   Pulmonary neg pulmonary ROS,    breath sounds clear to auscultation       Cardiovascular hypertension,  Rhythm:Regular Rate:Normal     Neuro/Psych negative neurological ROS     GI/Hepatic   Endo/Other  diabetes  Renal/GU Renal disease     Musculoskeletal  (+) Arthritis ,   Abdominal   Peds  Hematology negative hematology ROS (+)   Anesthesia Other Findings   Reproductive/Obstetrics                             Anesthesia Physical Anesthesia Plan  ASA: 3  Anesthesia Plan: MAC   Post-op Pain Management:    Induction: Intravenous  PONV Risk Score and Plan: 2 and Ondansetron and Propofol infusion  Airway Management Planned: Natural Airway and Simple Face Mask  Additional Equipment: None  Intra-op Plan:   Post-operative Plan:   Informed Consent: I have reviewed the patients History and Physical, chart, labs and discussed the procedure including the risks, benefits and alternatives for the proposed anesthesia with the patient or authorized representative who has indicated his/her understanding and acceptance.       Plan Discussed with: CRNA  Anesthesia Plan Comments:         Anesthesia Quick Evaluation

## 2022-02-18 NOTE — Discharge Instructions (Signed)

## 2022-02-18 NOTE — Transfer of Care (Signed)
Immediate Anesthesia Transfer of Care Note  Patient: Heitor L Platte  Procedure(s) Performed: GOLD SEED IMPLANT (Prostate) SPACE OAR INSTILLATION (Prostate)  Patient Location: PACU  Anesthesia Type:MAC  Level of Consciousness: awake, alert , oriented and patient cooperative  Airway & Oxygen Therapy: Patient Spontanous Breathing  Post-op Assessment: Report given to RN and Post -op Vital signs reviewed and stable  Post vital signs: Reviewed and stable  Last Vitals:  Vitals Value Taken Time  BP 99/53 02/18/22 0947  Temp    Pulse 54 02/18/22 0951  Resp 16 02/18/22 0951  SpO2 95 % 02/18/22 0951  Vitals shown include unvalidated device data.  Last Pain:  Vitals:   02/18/22 0732  TempSrc: Oral  PainSc: 0-No pain      Patients Stated Pain Goal: 4 (74/[id number])  Complications: No notable events documented.

## 2022-02-18 NOTE — Interval H&P Note (Signed)
History and Physical Interval Note:  02/18/2022 7:25 AM  Patrick Moss  has presented today for surgery, with the diagnosis of PROSTATE CANCER.  The various methods of treatment have been discussed with the patient and family. After consideration of risks, benefits and other options for treatment, the patient has consented to  Procedure(s) with comments: GOLD SEED IMPLANT (N/A) - 30 MINS SPACE OAR INSTILLATION (N/A) as a surgical intervention.  The patient's history has been reviewed, patient examined, no change in status, stable for surgery.  I have reviewed the patient's chart and labs.  Questions were answered to the patient's satisfaction.     Remi Haggard

## 2022-02-18 NOTE — Op Note (Signed)
Preoperative diagnosis: Clinically localized adenocarcinoma of the prostate   Postoperative diagnosis: Clinically localized adenocarcinoma of the prostate  Procedure: 1) Placement of fiducial markers into prostate                    2) Insertion of SpaceOAR hydrogel   Surgeon: Remi Haggard, MD  Radiation oncologist: Tammi Klippel  Anesthesia: General  EBL: Minimal  Complications: None  Indication: Patrick Moss is a 81 y.o. gentleman with clinically localized prostate cancer. After discussing management options for treatment, he elected to proceed with radiotherapy. He presents today for the above procedures. The potential risks, complications, alternative options, and expected recovery course have been discussed in detail with the patient and he has provided informed consent to proceed.  Description of procedure: The patient was administered preoperative antibiotics, placed in the dorsal lithotomy position, and prepped and draped in the usual sterile fashion. Next, transrectal ultrasonography was utilized to visualize the prostate. Three gold fiducial markers were then placed into the prostate via transperineal needles under ultrasound guidance at the left apex, left base, and right mid gland under direct ultrasound guidance. A site in the midline was then selected on the perineum for placement of an 18 g needle with saline. The needle was advanced above the rectum and below Denonvillier's fascia to the mid gland and confirmed to be in the midline on transverse imaging. One cc of saline was injected confirming appropriate expansion of this space. A total of 5 cc of saline was then injected to open the space further bilaterally. The saline syringe was then removed and the SpaceOAR hydrogel was injected with good distribution bilaterally. He tolerated the procedure well and without complications. He was given a voiding trial prior to discharge from the PACU.  Remi Haggard, MD

## 2022-02-18 NOTE — Anesthesia Postprocedure Evaluation (Signed)
Anesthesia Post Note  Patient: Patrick Moss  Procedure(s) Performed: GOLD SEED IMPLANT (Prostate) SPACE OAR INSTILLATION (Prostate)     Patient location during evaluation: PACU Anesthesia Type: MAC Level of consciousness: awake and alert Pain management: pain level controlled Vital Signs Assessment: post-procedure vital signs reviewed and stable Respiratory status: spontaneous breathing, nonlabored ventilation, respiratory function stable and patient connected to nasal cannula oxygen Cardiovascular status: stable and blood pressure returned to baseline Postop Assessment: no apparent nausea or vomiting Anesthetic complications: no   No notable events documented.  Last Vitals:  Vitals:   02/18/22 1030 02/18/22 1059  BP: (!) 122/56 136/65  Pulse: (!) 53 62  Resp: (!) 23 17  Temp: 36.7 C 36.7 C  SpO2: 100% 99%    Last Pain:  Vitals:   02/18/22 1059  TempSrc:   PainSc: 0-No pain                 Effie Berkshire

## 2022-02-19 ENCOUNTER — Telehealth: Payer: Self-pay | Admitting: Family Medicine

## 2022-02-19 ENCOUNTER — Encounter (HOSPITAL_BASED_OUTPATIENT_CLINIC_OR_DEPARTMENT_OTHER): Payer: Self-pay | Admitting: Urology

## 2022-02-19 DIAGNOSIS — M47816 Spondylosis without myelopathy or radiculopathy, lumbar region: Secondary | ICD-10-CM

## 2022-02-19 MED ORDER — CELECOXIB 400 MG PO CAPS: 400.0000 mg | ORAL_CAPSULE | Freq: Every day | ORAL | 3 refills | Status: DC

## 2022-02-19 NOTE — Telephone Encounter (Signed)
Sent to [facility]

## 2022-03-03 DIAGNOSIS — C61 Malignant neoplasm of prostate: Secondary | ICD-10-CM | POA: Diagnosis not present

## 2022-03-10 DIAGNOSIS — C61 Malignant neoplasm of prostate: Secondary | ICD-10-CM | POA: Diagnosis not present

## 2022-03-11 ENCOUNTER — Encounter: Payer: Self-pay | Admitting: Urology

## 2022-03-11 ENCOUNTER — Ambulatory Visit: Payer: Medicare HMO | Admitting: Urology

## 2022-03-11 ENCOUNTER — Telehealth: Payer: Self-pay

## 2022-03-11 VITALS — BP 157/76 | HR 73 | Ht 66.0 in | Wt 179.5 lb

## 2022-03-11 DIAGNOSIS — C61 Malignant neoplasm of prostate: Secondary | ICD-10-CM | POA: Diagnosis not present

## 2022-03-11 MED ORDER — LEUPROLIDE ACETATE (6 MONTH) 45 MG ~~LOC~~ KIT
45.0000 mg | PACK | Freq: Once | SUBCUTANEOUS | Status: AC
  Administered 2022-03-11: 45 mg via SUBCUTANEOUS

## 2022-03-11 NOTE — Telephone Encounter (Signed)
Patient call wanting to know if it is okay to take a flu. Made patient aware that it is okay for him to get his flu shot. Patient voiced understanding.

## 2022-03-11 NOTE — Progress Notes (Signed)
History of Present Illness: 6.13.2023: He underwent ultrasound and biopsy of the prostate.  PSA 13.2, prostate volume 105 mL, PSA density 0.13.  Biopsy results as follows:    9/12 cores positive-- 1 core  revealed GG 1 pattern 1 core revealed GG 2 pattern 7 cores revealed GG 3 pattern  Perineural invasion was found in 4 of the cores.  PET CT scan was performed on 6.22.2023.  This revealed no evidence of extra prostatic disease.  8.22.2023: ST ADT commenced-the patient received Firmagon 240 mg.  9.5.2023: Underwent placement of fiducial markers and SpaceOAR.  9.26.2023: He returns today for continuation of short-term androgen deprivation therapy.  He has had no issues since his [place].  He initiates EBRT on 2 October.  Past Medical History:  Diagnosis Date   Borderline glaucoma of both eyes    BPH associated with nocturia    CKD (chronic kidney disease), stage II    Complication of anesthesia    per pt emergent hiccups   Hyperlipidemia, mixed    Hypertension    Malignant neoplasm prostate ([place]) 11/2021   primary urologist---  dr dahl[place]/  radiation oncology [facility];  dx 06/ 2023,  Gleason 4+3,  PSA  13.2   OA (osteoarthritis)    knees   Type 2 diabetes mellitus treated with insulin ([place])    followed by pcp ( previous seen by endocriologist--- dr g. nida ,lov note in epic 02-19-2021);    (02-11-2022  pt stated checks blood sugar twice daily,  fasting sugar average 90s  to 120s)   Wears dentures    upper   Wears glasses     Past Surgical History:  Procedure Laterality Date   FEMUR IM NAIL Right 05/03/2021   '@[facility]'$ --  [facility]   FOOT FRACTURE SURGERY Right    2017;   ORIF , per pt plate under foot   GOLD SEED IMPLANT N/A 02/18/2022   Procedure: GOLD SEED IMPLANT;  Surgeon: Remi Haggard, MD;  Location: [facility];  Service: Urology;  Laterality: N/A;  G[place] Bilateral 2007   approx   SPACE OAR INSTILLATION  N/A 02/18/2022   Procedure: SPACE OAR INSTILLATION;  Surgeon: Remi Haggard, MD;  [facility];  Service: Urology;  Laterality: N/A;    Home Medications:  Allergies as of 03/11/2022       Reactions   Septra [sulfamethoxazole-trimethoprim] Hives        Medication List        Accurate as of March 11, 2022  8:45 AM. If you have any questions, ask your nurse or doctor.          amLODipine 5 MG tablet Commonly known as: NORVASC Take 1 tablet (5 mg total) by mouth daily. What changed: when to take this   aspirin 81 MG tablet Take 81 mg by mouth at bedtime.   B-D ULTRAFINE III SHORT PEN 31G X 8 MM Misc Generic drug: Insulin Pen Needle USE  1  PEN  NEEDLE AS DIRECTED   Basaglar KwikPen 100 UNIT/ML Inject 30 Units into the skin daily. What changed: when to take this   calcium carbonate 1500 (600 Ca) MG Tabs tablet Commonly known as: OSCAL Take 1,500 mg by mouth 2 (two) times daily with a meal.   celecoxib 400 MG capsule Commonly known as: CELEBREX Take 1 capsule (400 mg total) by mouth daily. With food   diclofenac Sodium 1 % Gel Commonly  known as: Voltaren Apply 4 g topically 4 (four) times daily. For the knee What changed:  when to take this reasons to take this   metFORMIN 500 MG 24 hr tablet Commonly known as: GLUCOPHAGE-XR TAKE 2 TABLETS EVERY DAY AFTER BREAKFAST What changed: See the new instructions.   omega-3 fish oil 1000 MG Caps capsule Commonly known as: MAXEPA Take 1 capsule by mouth daily.   rosuvastatin 5 MG tablet Commonly known as: CRESTOR Take 1 tablet (5 mg total) by mouth daily.   triamcinolone cream 0.1 % Commonly known as: KENALOG Apply 1 application. topically 3 (three) times daily. For the ankle area   True Metrix Blood Glucose Test test strip Generic drug: glucose blood TEST BLOOD SUGAR TWO TO THREE TIMES DAILY Dx I43.32   Trulicity 1.5 RJ/1.8AC Sopn Generic drug: Dulaglutide Inject 1.5 mg into  the skin once a week. What changed: additional instructions   valsartan-hydrochlorothiazide 320-25 MG tablet Commonly known as: DIOVAN-HCT Take 1 tablet by mouth daily.   Vitamin D3 50 MCG (2000 UT) Tabs Take 1 capsule by mouth daily.        Allergies:  Allergies  Allergen Reactions   Septra [Sulfamethoxazole-Trimethoprim] Hives    Family History  Problem Relation Age of Onset   Diabetes Mother    Diabetes Father    Diabetes Brother     Social History:  reports that he has never smoked. He has never used smokeless tobacco. He reports that he does not drink alcohol and does not use drugs.  ROS: A complete review of systems was performed.  All systems are negative except for pertinent findings as noted.  Physical Exam:  Vital signs in last 24 hours: There were no vitals taken for this visit. Constitutional:  Alert and oriented, No acute distress Cardiovascular: Regular rate  Respiratory: Normal respiratory effort Neurologic: Grossly intact, no focal deficits Psychiatric: Normal mood and affect  I have reviewed prior pt notes  I have reviewed notes from Dr. Lynnette Caffey  I have reviewed urinalysis results  I have independently reviewed prior imaging  I have reviewed prior PSA and pathology.  Results    Impression/Assessment:  Grade group 3 adenocarcinoma the prostate, he has started short-term androgen deprivation therapy, had fiducial markers and SpaceOAR placed in advance of his EBRT which starts next week.  He is doing well  Plan:  15-monthLupron given today  I will check labs today  I will see back in 3 months for recheck

## 2022-03-11 NOTE — Progress Notes (Signed)
Eligard SubQ Injection   Due to Prostate Cancer patient is present today for a Eligard Injection.  Medication: Eligard 6 month Dose: 45 mg  Location: left  Lot: [id number] Exp: 06/17/2023  Patient tolerated well, no complications were noted  Performed by: Levi Aland, CMA

## 2022-03-12 DIAGNOSIS — C61 Malignant neoplasm of prostate: Secondary | ICD-10-CM | POA: Diagnosis not present

## 2022-03-12 LAB — PSA: Prostate Specific Ag, Serum: 6.1 ng/mL — ABNORMAL HIGH (ref 0.0–4.0)

## 2022-03-13 DIAGNOSIS — Z961 Presence of intraocular lens: Secondary | ICD-10-CM | POA: Diagnosis not present

## 2022-03-13 DIAGNOSIS — Z794 Long term (current) use of insulin: Secondary | ICD-10-CM | POA: Diagnosis not present

## 2022-03-13 DIAGNOSIS — E119 Type 2 diabetes mellitus without complications: Secondary | ICD-10-CM | POA: Diagnosis not present

## 2022-03-13 DIAGNOSIS — H40023 Open angle with borderline findings, high risk, bilateral: Secondary | ICD-10-CM | POA: Diagnosis not present

## 2022-03-13 LAB — HM DIABETES EYE EXAM

## 2022-03-17 DIAGNOSIS — C61 Malignant neoplasm of prostate: Secondary | ICD-10-CM | POA: Diagnosis not present

## 2022-03-17 DIAGNOSIS — R300 Dysuria: Secondary | ICD-10-CM | POA: Diagnosis not present

## 2022-03-18 ENCOUNTER — Telehealth: Payer: Self-pay

## 2022-03-18 ENCOUNTER — Ambulatory Visit: Payer: Medicare HMO

## 2022-03-18 DIAGNOSIS — R300 Dysuria: Secondary | ICD-10-CM | POA: Diagnosis not present

## 2022-03-18 DIAGNOSIS — C61 Malignant neoplasm of prostate: Secondary | ICD-10-CM | POA: Diagnosis not present

## 2022-03-18 NOTE — Telephone Encounter (Signed)
Left voicemail requesting call back to go over results.

## 2022-03-18 NOTE — Telephone Encounter (Signed)
-----   Message from Franchot Gallo, MD sent at 03/18/2022  2:23 PM EDT ----- Please call patient-great news, PSA down, give number. ----- Message ----- From: Audie Box, CMA Sent: 03/12/2022   8:13 AM EDT To: Franchot Gallo, MD  Please review.

## 2022-03-19 DIAGNOSIS — C61 Malignant neoplasm of prostate: Secondary | ICD-10-CM | POA: Diagnosis not present

## 2022-03-19 DIAGNOSIS — R300 Dysuria: Secondary | ICD-10-CM | POA: Diagnosis not present

## 2022-03-19 NOTE — Telephone Encounter (Signed)
Patient aware of results and will f/u as scheduled.

## 2022-03-20 DIAGNOSIS — C61 Malignant neoplasm of prostate: Secondary | ICD-10-CM | POA: Diagnosis not present

## 2022-03-20 DIAGNOSIS — R300 Dysuria: Secondary | ICD-10-CM | POA: Diagnosis not present

## 2022-03-21 DIAGNOSIS — C61 Malignant neoplasm of prostate: Secondary | ICD-10-CM | POA: Diagnosis not present

## 2022-03-21 DIAGNOSIS — R300 Dysuria: Secondary | ICD-10-CM | POA: Diagnosis not present

## 2022-03-24 DIAGNOSIS — R300 Dysuria: Secondary | ICD-10-CM | POA: Diagnosis not present

## 2022-03-24 DIAGNOSIS — C61 Malignant neoplasm of prostate: Secondary | ICD-10-CM | POA: Diagnosis not present

## 2022-03-25 DIAGNOSIS — C61 Malignant neoplasm of prostate: Secondary | ICD-10-CM | POA: Diagnosis not present

## 2022-03-25 DIAGNOSIS — R300 Dysuria: Secondary | ICD-10-CM | POA: Diagnosis not present

## 2022-03-26 DIAGNOSIS — C61 Malignant neoplasm of prostate: Secondary | ICD-10-CM | POA: Diagnosis not present

## 2022-03-26 DIAGNOSIS — R300 Dysuria: Secondary | ICD-10-CM | POA: Diagnosis not present

## 2022-03-27 ENCOUNTER — Telehealth: Payer: Self-pay | Admitting: Family Medicine

## 2022-03-27 DIAGNOSIS — E782 Mixed hyperlipidemia: Secondary | ICD-10-CM

## 2022-03-27 DIAGNOSIS — I152 Hypertension secondary to endocrine disorders: Secondary | ICD-10-CM

## 2022-03-31 DIAGNOSIS — C61 Malignant neoplasm of prostate: Secondary | ICD-10-CM | POA: Diagnosis not present

## 2022-03-31 DIAGNOSIS — R300 Dysuria: Secondary | ICD-10-CM | POA: Diagnosis not present

## 2022-04-01 DIAGNOSIS — C61 Malignant neoplasm of prostate: Secondary | ICD-10-CM | POA: Diagnosis not present

## 2022-04-01 DIAGNOSIS — R300 Dysuria: Secondary | ICD-10-CM | POA: Diagnosis not present

## 2022-04-02 DIAGNOSIS — R300 Dysuria: Secondary | ICD-10-CM | POA: Diagnosis not present

## 2022-04-02 DIAGNOSIS — C61 Malignant neoplasm of prostate: Secondary | ICD-10-CM | POA: Diagnosis not present

## 2022-04-03 DIAGNOSIS — C61 Malignant neoplasm of prostate: Secondary | ICD-10-CM | POA: Diagnosis not present

## 2022-04-03 DIAGNOSIS — R300 Dysuria: Secondary | ICD-10-CM | POA: Diagnosis not present

## 2022-04-04 ENCOUNTER — Telehealth: Payer: Self-pay | Admitting: Pharmacist

## 2022-04-04 DIAGNOSIS — C61 Malignant neoplasm of prostate: Secondary | ICD-10-CM | POA: Diagnosis not present

## 2022-04-04 DIAGNOSIS — R300 Dysuria: Secondary | ICD-10-CM | POA: Diagnosis not present

## 2022-04-04 NOTE — Telephone Encounter (Signed)
[employer] patient assistance #4 boxes here for pick up Patient aware

## 2022-04-07 DIAGNOSIS — C61 Malignant neoplasm of prostate: Secondary | ICD-10-CM | POA: Diagnosis not present

## 2022-04-07 DIAGNOSIS — R300 Dysuria: Secondary | ICD-10-CM | POA: Diagnosis not present

## 2022-04-07 MED ORDER — VALSARTAN-HYDROCHLOROTHIAZIDE 320-25 MG PO TABS: 1.0000 | ORAL_TABLET | Freq: Every day | ORAL | 2 refills | Status: DC

## 2022-04-07 MED ORDER — AMLODIPINE BESYLATE 5 MG PO TABS: 5.0000 mg | ORAL_TABLET | Freq: Every day | ORAL | 2 refills | Status: DC

## 2022-04-07 MED ORDER — ROSUVASTATIN CALCIUM 5 MG PO TABS: 5.0000 mg | ORAL_TABLET | Freq: Every day | ORAL | 2 refills | Status: DC

## 2022-04-07 NOTE — Telephone Encounter (Signed)
Busy/NA, meds that were last sent to [facility], sent to [facility]

## 2022-04-07 NOTE — Telephone Encounter (Signed)
Pt needs his refills sent to [facility] mail order. Cancel at [employer]. 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

## 2022-04-07 NOTE — Addendum Note (Signed)
Addended by: Antonietta Barcelona D on: 04/07/2022 04:30 PM   Modules accepted: Orders

## 2022-04-08 DIAGNOSIS — C61 Malignant neoplasm of prostate: Secondary | ICD-10-CM | POA: Diagnosis not present

## 2022-04-08 DIAGNOSIS — R300 Dysuria: Secondary | ICD-10-CM | POA: Diagnosis not present

## 2022-04-09 DIAGNOSIS — C61 Malignant neoplasm of prostate: Secondary | ICD-10-CM | POA: Diagnosis not present

## 2022-04-09 DIAGNOSIS — R300 Dysuria: Secondary | ICD-10-CM | POA: Diagnosis not present

## 2022-04-10 DIAGNOSIS — R300 Dysuria: Secondary | ICD-10-CM | POA: Diagnosis not present

## 2022-04-10 DIAGNOSIS — C61 Malignant neoplasm of prostate: Secondary | ICD-10-CM | POA: Diagnosis not present

## 2022-04-11 DIAGNOSIS — R300 Dysuria: Secondary | ICD-10-CM | POA: Diagnosis not present

## 2022-04-11 DIAGNOSIS — C61 Malignant neoplasm of prostate: Secondary | ICD-10-CM | POA: Diagnosis not present

## 2022-04-14 DIAGNOSIS — C61 Malignant neoplasm of prostate: Secondary | ICD-10-CM | POA: Diagnosis not present

## 2022-04-14 DIAGNOSIS — R300 Dysuria: Secondary | ICD-10-CM | POA: Diagnosis not present

## 2022-04-15 ENCOUNTER — Ambulatory Visit: Payer: Medicare HMO | Admitting: Family Medicine

## 2022-04-15 DIAGNOSIS — C61 Malignant neoplasm of prostate: Secondary | ICD-10-CM | POA: Diagnosis not present

## 2022-04-15 DIAGNOSIS — R300 Dysuria: Secondary | ICD-10-CM | POA: Diagnosis not present

## 2022-04-16 DIAGNOSIS — C61 Malignant neoplasm of prostate: Secondary | ICD-10-CM | POA: Diagnosis not present

## 2022-04-17 DIAGNOSIS — C61 Malignant neoplasm of prostate: Secondary | ICD-10-CM | POA: Diagnosis not present

## 2022-04-18 DIAGNOSIS — C61 Malignant neoplasm of prostate: Secondary | ICD-10-CM | POA: Diagnosis not present

## 2022-04-21 DIAGNOSIS — C61 Malignant neoplasm of prostate: Secondary | ICD-10-CM | POA: Diagnosis not present

## 2022-04-22 DIAGNOSIS — C61 Malignant neoplasm of prostate: Secondary | ICD-10-CM | POA: Diagnosis not present

## 2022-04-23 ENCOUNTER — Telehealth: Payer: Medicare HMO

## 2022-04-23 DIAGNOSIS — C61 Malignant neoplasm of prostate: Secondary | ICD-10-CM | POA: Diagnosis not present

## 2022-04-24 DIAGNOSIS — C61 Malignant neoplasm of prostate: Secondary | ICD-10-CM | POA: Diagnosis not present

## 2022-04-25 DIAGNOSIS — C61 Malignant neoplasm of prostate: Secondary | ICD-10-CM | POA: Diagnosis not present

## 2022-05-06 ENCOUNTER — Telehealth: Payer: Self-pay

## 2022-05-06 NOTE — Telephone Encounter (Signed)
Submitted RE-ENROLLMENT application for TRULICITY 1.'5MG'$ /0.5ML to [place] for patient assistance.   Phone: [phone]

## 2022-05-07 ENCOUNTER — Other Ambulatory Visit: Payer: Self-pay

## 2022-05-07 ENCOUNTER — Emergency Department (HOSPITAL_COMMUNITY): Payer: Medicare HMO

## 2022-05-07 ENCOUNTER — Observation Stay (HOSPITAL_BASED_OUTPATIENT_CLINIC_OR_DEPARTMENT_OTHER): Payer: Medicare HMO

## 2022-05-07 ENCOUNTER — Observation Stay (HOSPITAL_COMMUNITY): Payer: Medicare HMO

## 2022-05-07 ENCOUNTER — Observation Stay (HOSPITAL_COMMUNITY)
Admission: EM | Admit: 2022-05-07 | Discharge: 2022-05-08 | Disposition: A | Discharge status: Home / Self Care | Payer: Medicare HMO | Attending: Internal Medicine | Admitting: Internal Medicine

## 2022-05-07 DIAGNOSIS — Z8546 Personal history of malignant neoplasm of prostate: Secondary | ICD-10-CM | POA: Insufficient documentation

## 2022-05-07 DIAGNOSIS — I10 Essential (primary) hypertension: Secondary | ICD-10-CM | POA: Diagnosis present

## 2022-05-07 DIAGNOSIS — Z79899 Other long term (current) drug therapy: Secondary | ICD-10-CM | POA: Diagnosis not present

## 2022-05-07 DIAGNOSIS — Z7984 Long term (current) use of oral hypoglycemic drugs: Secondary | ICD-10-CM | POA: Diagnosis not present

## 2022-05-07 DIAGNOSIS — E6609 Other obesity due to excess calories: Secondary | ICD-10-CM | POA: Diagnosis not present

## 2022-05-07 DIAGNOSIS — I639 Cerebral infarction, unspecified: Secondary | ICD-10-CM | POA: Diagnosis not present

## 2022-05-07 DIAGNOSIS — M1991 Primary osteoarthritis, unspecified site: Secondary | ICD-10-CM | POA: Diagnosis not present

## 2022-05-07 DIAGNOSIS — E66811 Obesity, class 1: Secondary | ICD-10-CM

## 2022-05-07 DIAGNOSIS — E785 Hyperlipidemia, unspecified: Secondary | ICD-10-CM | POA: Diagnosis present

## 2022-05-07 DIAGNOSIS — I63231 Cerebral infarction due to unspecified occlusion or stenosis of right carotid arteries: Principal | ICD-10-CM | POA: Insufficient documentation

## 2022-05-07 DIAGNOSIS — E1140 Type 2 diabetes mellitus with diabetic neuropathy, unspecified: Secondary | ICD-10-CM | POA: Diagnosis not present

## 2022-05-07 DIAGNOSIS — R29810 Facial weakness: Secondary | ICD-10-CM | POA: Diagnosis present

## 2022-05-07 DIAGNOSIS — K219 Gastro-esophageal reflux disease without esophagitis: Secondary | ICD-10-CM | POA: Diagnosis not present

## 2022-05-07 DIAGNOSIS — N1831 Chronic kidney disease, stage 3a: Secondary | ICD-10-CM | POA: Diagnosis not present

## 2022-05-07 DIAGNOSIS — Z6832 Body mass index (BMI) 32.0-32.9, adult: Secondary | ICD-10-CM | POA: Diagnosis not present

## 2022-05-07 DIAGNOSIS — E119 Type 2 diabetes mellitus without complications: Secondary | ICD-10-CM | POA: Diagnosis present

## 2022-05-07 DIAGNOSIS — M1990 Unspecified osteoarthritis, unspecified site: Secondary | ICD-10-CM | POA: Insufficient documentation

## 2022-05-07 DIAGNOSIS — I129 Hypertensive chronic kidney disease with stage 1 through stage 4 chronic kidney disease, or unspecified chronic kidney disease: Secondary | ICD-10-CM | POA: Insufficient documentation

## 2022-05-07 DIAGNOSIS — E1121 Type 2 diabetes mellitus with diabetic nephropathy: Secondary | ICD-10-CM

## 2022-05-07 DIAGNOSIS — E1122 Type 2 diabetes mellitus with diabetic chronic kidney disease: Secondary | ICD-10-CM | POA: Insufficient documentation

## 2022-05-07 DIAGNOSIS — R4182 Altered mental status, unspecified: Secondary | ICD-10-CM | POA: Diagnosis not present

## 2022-05-07 DIAGNOSIS — G319 Degenerative disease of nervous system, unspecified: Secondary | ICD-10-CM | POA: Diagnosis not present

## 2022-05-07 DIAGNOSIS — Z794 Long term (current) use of insulin: Secondary | ICD-10-CM | POA: Diagnosis not present

## 2022-05-07 DIAGNOSIS — E782 Mixed hyperlipidemia: Secondary | ICD-10-CM | POA: Diagnosis not present

## 2022-05-07 DIAGNOSIS — Z7901 Long term (current) use of anticoagulants: Secondary | ICD-10-CM | POA: Diagnosis not present

## 2022-05-07 DIAGNOSIS — R29701 NIHSS score 1: Secondary | ICD-10-CM | POA: Insufficient documentation

## 2022-05-07 DIAGNOSIS — M47816 Spondylosis without myelopathy or radiculopathy, lumbar region: Secondary | ICD-10-CM

## 2022-05-07 DIAGNOSIS — R2990 Unspecified symptoms and signs involving the nervous system: Secondary | ICD-10-CM | POA: Diagnosis not present

## 2022-05-07 DIAGNOSIS — I6389 Other cerebral infarction: Secondary | ICD-10-CM

## 2022-05-07 DIAGNOSIS — I6523 Occlusion and stenosis of bilateral carotid arteries: Secondary | ICD-10-CM | POA: Diagnosis not present

## 2022-05-07 HISTORY — DX: Cerebral infarction, unspecified: I63.9

## 2022-05-07 LAB — URINALYSIS, ROUTINE W REFLEX MICROSCOPIC
Bacteria, UA: NONE SEEN
Bilirubin Urine: NEGATIVE
Glucose, UA: NEGATIVE mg/dL
Hgb urine dipstick: NEGATIVE
Ketones, ur: NEGATIVE mg/dL
Leukocytes,Ua: NEGATIVE
Nitrite: POSITIVE — AB
Protein, ur: 30 mg/dL — AB
Specific Gravity, Urine: 1.018 (ref 1.005–1.030)
pH: 6 (ref 5.0–8.0)

## 2022-05-07 LAB — CBC WITH DIFFERENTIAL/PLATELET
Abs Immature Granulocytes: 0.01 10*3/uL (ref 0.00–0.07)
Basophils Absolute: 0 10*3/uL (ref 0.0–0.1)
Basophils Relative: 1 %
Eosinophils Absolute: 0 10*3/uL (ref 0.0–0.5)
Eosinophils Relative: 1 %
HCT: 37.2 % — ABNORMAL LOW (ref 39.0–52.0)
Hemoglobin: 12.6 g/dL — ABNORMAL LOW (ref 13.0–17.0)
Immature Granulocytes: 0 %
Lymphocytes Relative: 15 %
Lymphs Abs: 1 10*3/uL (ref 0.7–4.0)
MCH: 31.6 pg (ref 26.0–34.0)
MCHC: 33.9 g/dL (ref 30.0–36.0)
MCV: 93.2 fL (ref 80.0–100.0)
Monocytes Absolute: 0.4 10*3/uL (ref 0.1–1.0)
Monocytes Relative: 6 %
Neutro Abs: 5.3 10*3/uL (ref 1.7–7.7)
Neutrophils Relative %: 77 %
Platelets: 261 10*3/uL (ref 150–400)
RBC: 3.99 MIL/uL — ABNORMAL LOW (ref 4.22–5.81)
RDW: 14.2 % (ref 11.5–15.5)
WBC: 6.8 10*3/uL (ref 4.0–10.5)
nRBC: 0 % (ref 0.0–0.2)

## 2022-05-07 LAB — COMPREHENSIVE METABOLIC PANEL
ALT: 32 U/L (ref 0–44)
AST: 24 U/L (ref 15–41)
Albumin: 4.1 g/dL (ref 3.5–5.0)
Alkaline Phosphatase: 73 U/L (ref 38–126)
Anion gap: 9 (ref 5–15)
BUN: 24 mg/dL — ABNORMAL HIGH (ref 8–23)
CO2: 25 mmol/L (ref 22–32)
Calcium: 9.8 mg/dL (ref 8.9–10.3)
Chloride: 105 mmol/L (ref 98–111)
Creatinine, Ser: 1.35 mg/dL — ABNORMAL HIGH (ref 0.61–1.24)
GFR, Estimated: 53 mL/min — ABNORMAL LOW (ref >60)
Glucose, Bld: 132 mg/dL — ABNORMAL HIGH (ref 70–99)
Potassium: 3.8 mmol/L (ref 3.5–5.1)
Sodium: 139 mmol/L (ref 135–145)
Total Bilirubin: 0.8 mg/dL (ref 0.3–1.2)
Total Protein: 7.6 g/dL (ref 6.5–8.1)

## 2022-05-07 LAB — CBG MONITORING, ED: Glucose-Capillary: 138 mg/dL — ABNORMAL HIGH (ref 70–99)

## 2022-05-07 LAB — ECHOCARDIOGRAM COMPLETE
AR max vel: 3 cm2
AV Area VTI: 3.11 cm2
AV Area mean vel: 3.12 cm2
AV Mean grad: 2 mmHg
AV Peak grad: 4.9 mmHg
Ao pk vel: 1.11 m/s
Area-P 1/2: 3.63 cm2
Height: 66 in
MV VTI: 2.52 cm2
S' Lateral: 3.3 cm
Weight: 2832 oz

## 2022-05-07 LAB — TSH: TSH: 4.394 u[IU]/mL (ref 0.350–4.500)

## 2022-05-07 LAB — GLUCOSE, CAPILLARY
Glucose-Capillary: 93 mg/dL (ref 70–99)
Glucose-Capillary: 127 mg/dL — ABNORMAL HIGH (ref 70–99)

## 2022-05-07 MED ORDER — ACETAMINOPHEN 650 MG RE SUPP: 650.0000 mg | RECTAL | Status: DC | PRN

## 2022-05-07 MED ORDER — SODIUM CHLORIDE 0.9 % IV SOLN: INTRAVENOUS | Status: AC

## 2022-05-07 MED ORDER — SENNOSIDES-DOCUSATE SODIUM 8.6-50 MG PO TABS: 1.0000 | ORAL_TABLET | Freq: Every evening | ORAL | Status: DC | PRN

## 2022-05-07 MED ORDER — CLOPIDOGREL BISULFATE 75 MG PO TABS
75.0000 mg | ORAL_TABLET | Freq: Every day | ORAL | Status: DC
  Administered 2022-05-07 – 2022-05-08 (×2): 75 mg via ORAL
  Filled 2022-05-07 (×3): qty 1

## 2022-05-07 MED ORDER — ASPIRIN 300 MG RE SUPP
300.0000 mg | Freq: Every day | RECTAL | Status: DC
  Filled 2022-05-07 (×2): qty 1

## 2022-05-07 MED ORDER — ACETAMINOPHEN 325 MG PO TABS: 650.0000 mg | ORAL_TABLET | ORAL | Status: DC | PRN

## 2022-05-07 MED ORDER — ENOXAPARIN SODIUM 40 MG/0.4ML IJ SOSY
40.0000 mg | PREFILLED_SYRINGE | INTRAMUSCULAR | Status: DC
  Administered 2022-05-07: 40 mg via SUBCUTANEOUS
  Filled 2022-05-07: qty 0.4

## 2022-05-07 MED ORDER — ASPIRIN 325 MG PO TABS
325.0000 mg | ORAL_TABLET | Freq: Every day | ORAL | Status: DC
  Administered 2022-05-07 – 2022-05-08 (×2): 325 mg via ORAL
  Filled 2022-05-07 (×3): qty 1

## 2022-05-07 MED ORDER — AMLODIPINE BESYLATE 5 MG PO TABS
5.0000 mg | ORAL_TABLET | Freq: Every day | ORAL | Status: DC
  Administered 2022-05-08: 5 mg via ORAL
  Filled 2022-05-07: qty 1

## 2022-05-07 MED ORDER — ATORVASTATIN CALCIUM 40 MG PO TABS
40.0000 mg | ORAL_TABLET | Freq: Every day | ORAL | Status: DC
  Administered 2022-05-07 – 2022-05-08 (×2): 40 mg via ORAL
  Filled 2022-05-07 (×2): qty 1

## 2022-05-07 MED ORDER — INSULIN ASPART 100 UNIT/ML IJ SOLN: 0.0000 [IU] | Freq: Three times a day (TID) | INTRAMUSCULAR | Status: DC

## 2022-05-07 MED ORDER — STROKE: EARLY STAGES OF RECOVERY BOOK
Freq: Once | Status: AC
  Filled 2022-05-07: qty 1

## 2022-05-07 MED ORDER — ACETAMINOPHEN 160 MG/5ML PO SOLN: 650.0000 mg | ORAL | Status: DC | PRN

## 2022-05-07 MED ORDER — INSULIN GLARGINE-YFGN 100 UNIT/ML ~~LOC~~ SOLN
12.0000 [IU] | Freq: Every day | SUBCUTANEOUS | Status: DC
  Administered 2022-05-07: 12 [IU] via SUBCUTANEOUS
  Filled 2022-05-07 (×2): qty 0.12

## 2022-05-07 MED ORDER — PANTOPRAZOLE SODIUM 40 MG PO TBEC
40.0000 mg | DELAYED_RELEASE_TABLET | Freq: Every day | ORAL | Status: DC
  Administered 2022-05-07 – 2022-05-08 (×2): 40 mg via ORAL
  Filled 2022-05-07 (×3): qty 1

## 2022-05-07 MED ORDER — INSULIN ASPART 100 UNIT/ML IJ SOLN: 0.0000 [IU] | Freq: Every day | INTRAMUSCULAR | Status: DC

## 2022-05-07 NOTE — Assessment & Plan Note (Signed)
-  Permissive hypertension was allowed in the first 24 hours after admission -Home antihypertensive agents resumed at discharge -Heart healthy diet discussed with patient. -Reassess blood pressure at follow-up visit.

## 2022-05-07 NOTE — Assessment & Plan Note (Signed)
-  Continue statin -zetia daily started at discharge -Repeat lipid panel and LFTs in 3 months.

## 2022-05-07 NOTE — Assessment & Plan Note (Signed)
-  Protonix 40 mg daily started -Lifestyle changes discussed with patient.

## 2022-05-07 NOTE — ED Provider Notes (Signed)
[facility] EMERGENCY DEPARTMENT Provider Note   CSN: [id number] Arrival date & time: 05/07/22  1221     History  Chief Complaint  Patient presents with   Facial Droop    Patrick Moss is a 81 y.o. male.  Patient started with slurred speech and right facial droop around 9:00 last night.  Patient has a history of hypertension and diabetes and hyperlipidemia  The history is provided by the patient and medical records. No language interpreter was used.  Weakness Severity:  Moderate Onset quality:  Sudden Timing:  Constant Progression:  Worsening Chronicity:  New Context: not alcohol use   Relieved by:  Nothing Worsened by:  Nothing Ineffective treatments:  None tried Associated symptoms: no abdominal pain, no chest pain, no cough, no diarrhea, no frequency, no headaches and no seizures        Home Medications Prior to Admission medications   Medication Sig Start Date End Date Taking? Authorizing Provider  amLODipine (NORVASC) 5 MG tablet Take 1 tablet (5 mg total) by mouth daily. 04/07/22   [name], MD  aspirin 81 MG tablet Take 81 mg by mouth at bedtime.    [provider]  B-D ULTRAFINE III SHORT PEN 31G X 8 MM MISC USE  1  PEN  NEEDLE AS DIRECTED 01/14/17   Nida, Marella Chimes, MD  calcium carbonate (OSCAL) 1500 (600 Ca) MG TABS tablet Take 1,500 mg by mouth 2 (two) times daily with a meal.    [provider]  celecoxib (CELEBREX) 400 MG capsule Take 1 capsule (400 mg total) by mouth daily. With food 02/19/22   [name], MD  Cholecalciferol (VITAMIN D3) 50 MCG (2000 UT) TABS Take 1 capsule by mouth daily.    [provider]  diclofenac Sodium (VOLTAREN) 1 % GEL Apply 4 g topically 4 (four) times daily. For the knee Patient taking differently: Apply 4 g topically 4 (four) times daily as needed. For the knee 08/27/21   [name], MD  Dulaglutide (TRULICITY) 1.5 LE/7.5TZ SOPN Inject 1.5 mg into the skin once a week. Patient  taking differently: Inject 1.5 mg into the skin once a week. Saturday's 05/15/21   [name], MD  glucose blood (TRUE METRIX BLOOD GLUCOSE TEST) test strip TEST BLOOD SUGAR TWO TO THREE TIMES DAILY Dx E11.65 07/03/21   [name], MD  Insulin Glargine (BASAGLAR KWIKPEN) 100 UNIT/ML Inject 30 Units into the skin daily. Patient taking differently: Inject 30 Units into the skin at bedtime. 05/15/21   [name], MD  metFORMIN (GLUCOPHAGE-XR) 500 MG 24 hr tablet TAKE 2 TABLETS EVERY DAY AFTER BREAKFAST Patient taking differently: 1,000 mg daily with breakfast. 01/13/22   [name], MD  omega-3 fish oil (MAXEPA) 1000 MG CAPS capsule Take 1 capsule by mouth daily. 05/06/21   [provider]  rosuvastatin (CRESTOR) 5 MG tablet Take 1 tablet (5 mg total) by mouth daily. 04/07/22   [name], MD  triamcinolone cream (KENALOG) 0.1 % Apply 1 application. topically 3 (three) times daily. For the ankle area 08/27/21   [name], MD  valsartan-hydrochlorothiazide (DIOVAN-HCT) 320-25 MG tablet Take 1 tablet by mouth daily. 04/07/22   [name], MD      Allergies    Septra [sulfamethoxazole-trimethoprim]    Review of Systems   Review of Systems  Constitutional:  Negative for appetite change and fatigue.  HENT:  Negative for congestion, ear discharge and sinus pressure.   Eyes:  Negative for discharge.  Respiratory:  Negative for cough.   Cardiovascular:  Negative for chest pain.  Gastrointestinal:  Negative for abdominal pain and diarrhea.  Genitourinary:  Negative for frequency and hematuria.  Musculoskeletal:  Negative for back pain.  Skin:  Negative for rash.  Neurological:  Positive for weakness. Negative for seizures and headaches.       Patient with facial weakness and slurred speech  Psychiatric/Behavioral:  Negative for hallucinations.     Physical Exam Updated Vital Signs BP 107/68   Pulse 75   Temp 98.4 F (36.9 C) (Oral)   Resp 19   Ht '5\' 6"'$   (1.676 m)   Wt 80.3 kg   SpO2 92%   BMI 28.57 kg/m  Physical Exam Vitals and nursing note reviewed.  Constitutional:      Appearance: He is well-developed.  HENT:     Head: Normocephalic.     Nose: Nose normal.  Eyes:     General: No scleral icterus.    Conjunctiva/sclera: Conjunctivae normal.  Neck:     Thyroid: No thyromegaly.  Cardiovascular:     Rate and Rhythm: Normal rate and regular rhythm.     Heart sounds: No murmur heard.    No friction rub. No gallop.  Pulmonary:     Breath sounds: No stridor. No wheezing or rales.  Chest:     Chest wall: No tenderness.  Abdominal:     General: There is no distension.     Tenderness: There is no abdominal tenderness. There is no rebound.  Musculoskeletal:        General: Normal range of motion.     Cervical back: Neck supple.  Lymphadenopathy:     Cervical: No cervical adenopathy.  Skin:    Findings: No erythema or rash.  Neurological:     Mental Status: He is alert and oriented to person, place, and time.     Motor: No abnormal muscle tone.     Coordination: Coordination normal.     Comments: Right facial weakness and slurred speech  Psychiatric:        Behavior: Behavior normal.     ED Results / Procedures / Treatments   Labs (all labs ordered are listed, but only abnormal results are displayed) Labs Reviewed  CBC WITH DIFFERENTIAL/PLATELET - Abnormal; Notable for the following components:      Result Value   RBC 3.99 (*)    Hemoglobin 12.6 (*)    HCT 37.2 (*)    All other components within normal limits  COMPREHENSIVE METABOLIC PANEL - Abnormal; Notable for the following components:   Glucose, Bld 132 (*)    BUN 24 (*)    Creatinine, Ser 1.35 (*)    GFR, Estimated 53 (*)    All other components within normal limits  CBG MONITORING, ED - Abnormal; Notable for the following components:   Glucose-Capillary 138 (*)    All other components within normal limits  URINALYSIS, ROUTINE W REFLEX MICROSCOPIC     EKG None  Radiology MR BRAIN WO CONTRAST  Result Date: 05/07/2022 CLINICAL DATA:  Altered mental status EXAM: MRI HEAD WITHOUT CONTRAST MRA HEAD WITHOUT CONTRAST TECHNIQUE: Multiplanar, multi-echo pulse sequences of the brain and surrounding structures were acquired without intravenous contrast. Angiographic images of the Circle of Willis were acquired using MRA technique without intravenous contrast. COMPARISON:  None Available. FINDINGS: MRI HEAD FINDINGS Brain: There are 2 small foci of diffusion restriction with associated FLAIR signal abnormality in the right corona radiata consistent with acute to early subacute  infarcts. There is no associated hemorrhage or mass effect. There is no other evidence of acute infarct. There is no acute intracranial hemorrhage or extra-axial fluid collection There is mild background parenchymal volume loss with prominence of the ventricular system and extra-axial CSF spaces. There are scattered small foci of FLAIR signal abnormality in the remainder of the supratentorial white matter which are nonspecific but likely reflects sequela of mild chronic small-vessel ischemic change There is no mass lesion.  There is no mass effect or midline shift. Vascular: See below. Skull and upper cervical spine: Normal marrow signal. Sinuses/Orbits: The paranasal sinuses are clear. Bilateral lens implants are in place. The globes and orbits are otherwise unremarkable. Other: None. MRA HEAD FINDINGS Anterior circulation: The intracranial ICAs are patent, without significant atherosclerotic irregularity or significant stenosis. The bilateral MCAs are patent, without proximal stenosis or occlusion. The bilateral ACAs are patent, without proximal stenosis or occlusion. The anterior communicating artery is normal. There is no aneurysm or AVM. Posterior circulation: The bilateral V4 segments are patent. The basilar artery is patent. The major cerebellar arteries appear patent. The bilateral  PCAs are patent, without proximal stenosis or occlusion. There is no aneurysm or AVM. Anatomic variants: None. IMPRESSION: 1. Two small acute to early subacute infarcts in the right corona radiata. 2. Background parenchymal volume loss and mild chronic small-vessel ischemic change. 3.  Normal intracranial vasculature. Electronically Signed   By: Valetta Mole M.D.   On: 05/07/2022 14:03   MR ANGIO HEAD WO CONTRAST  Result Date: 05/07/2022 CLINICAL DATA:  Altered mental status EXAM: MRI HEAD WITHOUT CONTRAST MRA HEAD WITHOUT CONTRAST TECHNIQUE: Multiplanar, multi-echo pulse sequences of the brain and surrounding structures were acquired without intravenous contrast. Angiographic images of the Circle of Willis were acquired using MRA technique without intravenous contrast. COMPARISON:  None Available. FINDINGS: MRI HEAD FINDINGS Brain: There are 2 small foci of diffusion restriction with associated FLAIR signal abnormality in the right corona radiata consistent with acute to early subacute infarcts. There is no associated hemorrhage or mass effect. There is no other evidence of acute infarct. There is no acute intracranial hemorrhage or extra-axial fluid collection There is mild background parenchymal volume loss with prominence of the ventricular system and extra-axial CSF spaces. There are scattered small foci of FLAIR signal abnormality in the remainder of the supratentorial white matter which are nonspecific but likely reflects sequela of mild chronic small-vessel ischemic change There is no mass lesion.  There is no mass effect or midline shift. Vascular: See below. Skull and upper cervical spine: Normal marrow signal. Sinuses/Orbits: The paranasal sinuses are clear. Bilateral lens implants are in place. The globes and orbits are otherwise unremarkable. Other: None. MRA HEAD FINDINGS Anterior circulation: The intracranial ICAs are patent, without significant atherosclerotic irregularity or significant  stenosis. The bilateral MCAs are patent, without proximal stenosis or occlusion. The bilateral ACAs are patent, without proximal stenosis or occlusion. The anterior communicating artery is normal. There is no aneurysm or AVM. Posterior circulation: The bilateral V4 segments are patent. The basilar artery is patent. The major cerebellar arteries appear patent. The bilateral PCAs are patent, without proximal stenosis or occlusion. There is no aneurysm or AVM. Anatomic variants: None. IMPRESSION: 1. Two small acute to early subacute infarcts in the right corona radiata. 2. Background parenchymal volume loss and mild chronic small-vessel ischemic change. 3.  Normal intracranial vasculature. Electronically Signed   By: Valetta Mole M.D.   On: 05/07/2022 14:03    Procedures Procedures  Medications Ordered in ED Medications - No data to display  ED Course/ Medical Decision Making/ A&P  CRITICAL CARE Performed by: Milton Ferguson Total critical care time: 40 minutes Critical care time was exclusive of separately billable procedures and treating other patients. Critical care was necessary to treat or prevent imminent or life-threatening deterioration. Critical care was time spent personally by me on the following activities: development of treatment plan with patient and/or surrogate as well as nursing, discussions with consultants, evaluation of patient's response to treatment, examination of patient, obtaining history from patient or surrogate, ordering and performing treatments and interventions, ordering and review of laboratory studies, ordering and review of radiographic studies, pulse oximetry and re-evaluation of patient's condition.                          Medical Decision Making Amount and/or Complexity of Data Reviewed Labs: ordered. Radiology: ordered. ECG/medicine tests: ordered.  Risk Decision regarding hospitalization.  This patient presents to the ED for concern of slurred speech,  this involves an extensive number of treatment options, and is a complaint that carries with it a high risk of complications and morbidity.  The differential diagnosis includes stroke, metabolic   Co morbidities that complicate the patient evaluation  Hyperlipidemia, hypertension   Additional history obtained:  Additional history obtained from relative External records from outside source obtained and reviewed including hospital records   Lab Tests:  I Ordered, and personally interpreted labs.  The pertinent results include: BUN 24 creatinine 1.3, hemoglobin 12.6   Imaging Studies ordered:  I ordered imaging studies including MRI brain I independently visualized and interpreted imaging which showed 2 small subacute infarcts I agree with the radiologist interpretation   Cardiac Monitoring: / EKG:  The patient was maintained on a cardiac monitor.  I personally viewed and interpreted the cardiac monitored which showed an underlying rhythm of: Normal sinus rhythm   Consultations Obtained:  I requested consultation with the neurology and hospitalist,  and discussed lab and imaging findings as well as pertinent plan - they recommend: Admit to hospitalist with neurology consult   Problem List / ED Course / Critical interventions / Medication management  Hypertension stroke.   No additional medicine Reevaluation of the patient after these medicines showed that the patient stayed the same I have reviewed the patients home medicines and have made adjustments as needed   Social Determinants of Health:  None   Test / Admission - Considered:  None  Patient with acute stroke.  He will be admitted to medicine with neuroconsult        Final Clinical Impression(s) / ED Diagnoses Final diagnoses:  Acute stroke due to ischemia [facility])    Rx / DC Orders ED Discharge Orders     None         Milton Ferguson, MD 05/10/22 [phone]

## 2022-05-07 NOTE — Progress Notes (Signed)
On admission to floor alert and oriented x 4 and able to stand for weight and answer all questions appropriately .  At first said he could not smile normally but a few minutes later did have even smile and equal facial sensation.  Speech was slightly slurred but clear enough to be easily understood and patient and brother said it was just slightly off baseline and much better than last night.  Reported fractured hip last November and walks with a cane baseline due to that and right knee pain.  Has recently finished radiation for prostate cancer.

## 2022-05-07 NOTE — H&P (Signed)
History and Physical    Patient: Patrick Moss DOB: 1940/12/01 DOA: 05/07/2022 DOS: the patient was seen and examined on 05/07/2022 PCP: [name], MD  Patient coming from: Home  Chief Complaint:  Chief Complaint  Patient presents with   Facial Droop   HPI: Patrick Moss is a 81 y.o. male with medical history significant of hypertension, hyperlipidemia, chronic kidney disease stage III a, type 2 diabetes with nephropathy, osteoarthritis and history of malignant neoplasm of the prostate; who presented to the hospital secondary to right facial droop and slurred speech.  Symptoms present around 9 PM prior to go to bed (very mild and patient thought will call away on its own).  After awakening this morning patient symptoms still present and is slightly more pronounced; he presented to the emergency department for further evaluation and management.    Denies chest pain, nausea, vomiting, shortness of breath, fever, chills, dysuria, hematuria, melena, hematochezia, and denies any other focal neurologic deficits or complaints.  Patient last seen normal around 9 PM and the day prior to admission.  Out of therapeutic window for tPA.  Workup in the ED demonstrating 2 small acute/subacute infarcts in the right corona radiata.  No hemorrhagic changes.  Neurology service consulted and [facility] contacted to place patient in the hospital for further evaluation and management.  Review of Systems: As mentioned in the history of present illness. All other systems reviewed and are negative. Past Medical History:  Diagnosis Date   Borderline glaucoma of both eyes    BPH associated with nocturia    CKD (chronic kidney disease), stage II    Complication of anesthesia    per pt emergent hiccups   Hyperlipidemia, mixed    Hypertension    Malignant neoplasm prostate ([place]) 11/2021   primary urologist---  dr dahlstadt/  radiation oncology [facility];  dx 06/ 2023,  Gleason  4+3,  PSA  13.2   OA (osteoarthritis)    knees   Type 2 diabetes mellitus treated with insulin ([place])    followed by pcp ( previous seen by endocriologist--- dr g. nida ,lov note in epic 02-19-2021);    (02-11-2022  pt stated checks blood sugar twice daily,  fasting sugar average 90s  to 120s)   Wears dentures    upper   Wears glasses    Past Surgical History:  Procedure Laterality Date   FEMUR IM NAIL Right 05/03/2021   '@[facility]'$ --  [place]   FOOT FRACTURE SURGERY Right    2017;   ORIF , per pt plate under foot   GOLD SEED IMPLANT N/A 02/18/2022   Procedure: GOLD SEED IMPLANT;  Surgeon: Remi Haggard, MD;  Location: [facility];  Service: Urology;  Laterality: N/A;  [place] Bilateral 2007   approx   SPACE OAR INSTILLATION N/A 02/18/2022   Procedure: SPACE OAR INSTILLATION;  Surgeon: Remi Haggard, MD;  Location: [facility];  Service: Urology;  Laterality: N/A;   Social History:  reports that he has never smoked. He has never used smokeless tobacco. He reports that he does not drink alcohol and does not use drugs.  Allergies  Allergen Reactions   Septra [Sulfamethoxazole-Trimethoprim] Hives    Family History  Problem Relation Age of Onset   Diabetes Mother    Diabetes Father    Diabetes Brother     Prior to Admission medications   Medication Sig Start Date End Date Taking? Authorizing  Provider  amLODipine (NORVASC) 5 MG tablet Take 1 tablet (5 mg total) by mouth daily. 04/07/22   [name], MD  aspirin 81 MG tablet Take 81 mg by mouth at bedtime.    [provider]  B-D ULTRAFINE III SHORT PEN 31G X 8 MM MISC USE  1  PEN  NEEDLE AS DIRECTED 01/14/17   Nida, Marella Chimes, MD  calcium carbonate (OSCAL) 1500 (600 Ca) MG TABS tablet Take 1,500 mg by mouth 2 (two) times daily with a meal.    [provider]  celecoxib (CELEBREX) 400 MG capsule Take 1 capsule (400 mg total) by mouth daily. With food  02/19/22   [name], MD  Cholecalciferol (VITAMIN D3) 50 MCG (2000 UT) TABS Take 1 capsule by mouth daily.    [provider]  diclofenac Sodium (VOLTAREN) 1 % GEL Apply 4 g topically 4 (four) times daily. For the knee Patient taking differently: Apply 4 g topically 4 (four) times daily as needed. For the knee 08/27/21   [name], MD  Dulaglutide (TRULICITY) 1.5 FT/7.3UK SOPN Inject 1.5 mg into the skin once a week. Patient taking differently: Inject 1.5 mg into the skin once a week. Saturday's 05/15/21   [name], MD  glucose blood (TRUE METRIX BLOOD GLUCOSE TEST) test strip TEST BLOOD SUGAR TWO TO THREE TIMES DAILY Dx E11.65 07/03/21   [name], MD  Insulin Glargine (BASAGLAR KWIKPEN) 100 UNIT/ML Inject 30 Units into the skin daily. Patient taking differently: Inject 30 Units into the skin at bedtime. 05/15/21   [name], MD  metFORMIN (GLUCOPHAGE-XR) 500 MG 24 hr tablet TAKE 2 TABLETS EVERY DAY AFTER BREAKFAST Patient taking differently: 1,000 mg daily with breakfast. 01/13/22   [name], MD  omega-3 fish oil (MAXEPA) 1000 MG CAPS capsule Take 1 capsule by mouth daily. 05/06/21   [provider]  rosuvastatin (CRESTOR) 5 MG tablet Take 1 tablet (5 mg total) by mouth daily. 04/07/22   [name], MD  triamcinolone cream (KENALOG) 0.1 % Apply 1 application. topically 3 (three) times daily. For the ankle area 08/27/21   [name], MD  valsartan-hydrochlorothiazide (DIOVAN-HCT) 320-25 MG tablet Take 1 tablet by mouth daily. 04/07/22   [name], MD    Physical Exam: Vitals:   05/07/22 1422 05/07/22 1423 05/07/22 1613 05/07/22 1625  BP: 107/68 107/68 (!) 112/54 116/82  Pulse:  75 99 68  Resp:  '19 18 16  '$ Temp:  98.3 F (36.8 C) 98.1 F (36.7 C) 99 F (37.2 C)  TempSrc:  Oral Oral Oral  SpO2:  98% 98% 98%  Weight:      Height:       General exam: Alert, awake, oriented x 3; reporting right side face numbness and mild slurred  speech appreciated. Respiratory system: Clear to auscultation. Respiratory effort normal.  Good saturation on room air. Cardiovascular system:RRR. No rubs or gallops; no JVD. Gastrointestinal system: Abdomen is nondistended, soft and nontender. No organomegaly or masses felt. Normal bowel sounds heard. Central nervous system: Alert and oriented.  + Right facial weakness/numbness no other focal deficits reported.  Normal ocular muscles, meet uvula, no pronator drift and preserved muscular strength bilaterally. Extremities: No cyanosis or clubbing. Skin: No petechiae. Psychiatry: Judgement and insight appear normal. Mood & affect appropriate.   Data Reviewed: CBC: WBC 6.8, hemoglobin 12.6 and platelet count 261 K Comprehensive metabolic panel: Sodium 025, potassium 3.8, bicarb 25, chloride 105, glucose 132, BUN 24, creatinine 1.35 and normal LFTs. Urinalysis  with positive nitrite, negative leukocyte esterase, 30 protein and normal specific gravity.  Assessment and Plan: * Acute ischemic stroke (HCC) -Acute/subacute stroke appreciated on MRI at time of admission. -No large vessel occlusion on MRA seen. -Prior to admission patient was on aspirin. -Neurology service has been consulted and will follow recommendations -Continue aspirin and statin for now -Allow for permissive hypertension -Complete stroke workup follow recommendations by physical therapy, Occupational Therapy and speech therapy. -Patient follows swallowing screening at bedside while in the ED. -Lovenox for DVT prophylaxis has been ordered. -Continue telemetry monitoring and check TSH.   OA (osteoarthritis) -Continue as needed analgesics. -will physical therapy recommendations. -Expressing chronic difficulties with ambulation due to pain on his right lower extremity.  GERD (gastroesophageal reflux disease) -Protonix 40 mg daily started -Lifestyle changes discussed with patient.  Type 2 diabetes with nephropathy  (HCC) -Update A1c -Hold oral hypoglycemic agents while inpatient -Continue adjusted dose of Semglee and sliding scale insulin. -Modified carbohydrate diet discussed with patient.  Mixed hyperlipidemia -Continue statin -Checking lipid panel.  Class 1 obesity due to excess calories without serious comorbidity with body mass index (BMI) of 32.0 to 32.9 in adult -Low calorie diet, portion control and increase physical activity discussed with patient. -Patient expressed having lost some weight already while watching diet. -Body mass index is 28.57 kg/m.   Essential hypertension, benign -Allowing for permissive hypertension in the setting of acute/subacute ischemic stroke. -Low-dose amlodipine will be continued. -Follow-up vital signs.  Chronic kidney disease stage III a -Appears to be stable and at baseline -Continue to maintain adequate hydration -Minimize nephrotoxic agent -Follow renal function trend.  History of prostate cancer -Continue outpatient follow-up with urology service -Status post-radiation.   Advance Care Planning:   Code Status: Full Code   Consults: Neurology service  Family Communication: Brother at bedside.  Severity of Illness: The appropriate patient status for this patient is OBSERVATION. Observation status is judged to be reasonable and necessary in order to provide the required intensity of service to ensure the patient's safety. The patient's presenting symptoms, physical exam findings, and initial radiographic and laboratory data in the context of their medical condition is felt to place them at decreased risk for further clinical deterioration. Furthermore, it is anticipated that the patient will be medically stable for discharge from the hospital within 2 midnights of admission.   Author: Barton Dubois, MD 05/07/2022 6:28 PM  For on call review [email or web address].

## 2022-05-07 NOTE — ED Triage Notes (Signed)
Pt c/o right sided facial droop that he noticed when he woke up at 0700 this morning. Pt reports he was normal when he went to bed at 2200 last night. Denies arm or leg weakness.

## 2022-05-07 NOTE — Progress Notes (Incomplete)
*  PRELIMINARY RESULTS* Echocardiogram 2D Echocardiogram has been performed.  Patrick Moss 05/07/2022, 4:20 PM

## 2022-05-07 NOTE — Assessment & Plan Note (Addendum)
-  Continue as needed analgesics. -will physical therapy recommendations. -Expressing chronic difficulties with ambulation due to pain on his right lower extremity.

## 2022-05-07 NOTE — Assessment & Plan Note (Signed)
-  Acute/subacute stroke appreciated on MRI at time of admission. -No large vessel occlusion on MRA seen. -Prior to admission patient was on aspirin. -Neurology service has been consulted and will follow recommendations -Continue aspirin and statin for now -Allow for permissive hypertension -Complete stroke workup follow recommendations by physical therapy, Occupational Therapy and speech therapy. -Patient follows swallowing screening at bedside while in the ED. -Lovenox for DVT prophylaxis has been ordered. -Continue telemetry monitoring and check TSH.

## 2022-05-07 NOTE — Assessment & Plan Note (Signed)
-  Update A1c -Hold oral hypoglycemic agents while inpatient -Continue adjusted dose of Semglee and sliding scale insulin. -Modified carbohydrate diet discussed with patient.

## 2022-05-07 NOTE — Assessment & Plan Note (Signed)
-  Low calorie diet, portion control and increase physical activity discussed with patient. -Patient expressed having lost some weight already while watching diet. -Body mass index is 28.57 kg/m.

## 2022-05-08 DIAGNOSIS — E1169 Type 2 diabetes mellitus with other specified complication: Secondary | ICD-10-CM | POA: Diagnosis present

## 2022-05-08 DIAGNOSIS — E1121 Type 2 diabetes mellitus with diabetic nephropathy: Secondary | ICD-10-CM | POA: Diagnosis not present

## 2022-05-08 DIAGNOSIS — E782 Mixed hyperlipidemia: Secondary | ICD-10-CM | POA: Diagnosis present

## 2022-05-08 DIAGNOSIS — R1310 Dysphagia, unspecified: Secondary | ICD-10-CM | POA: Diagnosis not present

## 2022-05-08 DIAGNOSIS — K219 Gastro-esophageal reflux disease without esophagitis: Secondary | ICD-10-CM | POA: Diagnosis present

## 2022-05-08 DIAGNOSIS — E1165 Type 2 diabetes mellitus with hyperglycemia: Secondary | ICD-10-CM | POA: Diagnosis present

## 2022-05-08 DIAGNOSIS — Z794 Long term (current) use of insulin: Secondary | ICD-10-CM | POA: Diagnosis not present

## 2022-05-08 DIAGNOSIS — Z79899 Other long term (current) drug therapy: Secondary | ICD-10-CM | POA: Diagnosis not present

## 2022-05-08 DIAGNOSIS — I129 Hypertensive chronic kidney disease with stage 1 through stage 4 chronic kidney disease, or unspecified chronic kidney disease: Secondary | ICD-10-CM | POA: Diagnosis not present

## 2022-05-08 DIAGNOSIS — R29810 Facial weakness: Secondary | ICD-10-CM | POA: Diagnosis present

## 2022-05-08 DIAGNOSIS — R040 Epistaxis: Secondary | ICD-10-CM | POA: Diagnosis not present

## 2022-05-08 DIAGNOSIS — I152 Hypertension secondary to endocrine disorders: Secondary | ICD-10-CM | POA: Diagnosis present

## 2022-05-08 DIAGNOSIS — I63411 Cerebral infarction due to embolism of right middle cerebral artery: Secondary | ICD-10-CM | POA: Diagnosis not present

## 2022-05-08 DIAGNOSIS — D649 Anemia, unspecified: Secondary | ICD-10-CM | POA: Diagnosis present

## 2022-05-08 DIAGNOSIS — Z7982 Long term (current) use of aspirin: Secondary | ICD-10-CM | POA: Diagnosis not present

## 2022-05-08 DIAGNOSIS — E119 Type 2 diabetes mellitus without complications: Secondary | ICD-10-CM | POA: Diagnosis not present

## 2022-05-08 DIAGNOSIS — N1831 Chronic kidney disease, stage 3a: Secondary | ICD-10-CM | POA: Diagnosis present

## 2022-05-08 DIAGNOSIS — R471 Dysarthria and anarthria: Secondary | ICD-10-CM | POA: Diagnosis present

## 2022-05-08 DIAGNOSIS — R29705 NIHSS score 5: Secondary | ICD-10-CM | POA: Diagnosis present

## 2022-05-08 DIAGNOSIS — I63239 Cerebral infarction due to unspecified occlusion or stenosis of unspecified carotid arteries: Secondary | ICD-10-CM | POA: Diagnosis not present

## 2022-05-08 DIAGNOSIS — R270 Ataxia, unspecified: Secondary | ICD-10-CM | POA: Diagnosis present

## 2022-05-08 DIAGNOSIS — I6521 Occlusion and stenosis of right carotid artery: Secondary | ICD-10-CM | POA: Diagnosis not present

## 2022-05-08 DIAGNOSIS — I6529 Occlusion and stenosis of unspecified carotid artery: Secondary | ICD-10-CM | POA: Diagnosis not present

## 2022-05-08 DIAGNOSIS — I6503 Occlusion and stenosis of bilateral vertebral arteries: Secondary | ICD-10-CM | POA: Diagnosis not present

## 2022-05-08 DIAGNOSIS — N400 Enlarged prostate without lower urinary tract symptoms: Secondary | ICD-10-CM | POA: Diagnosis present

## 2022-05-08 DIAGNOSIS — I2510 Atherosclerotic heart disease of native coronary artery without angina pectoris: Secondary | ICD-10-CM | POA: Diagnosis present

## 2022-05-08 DIAGNOSIS — M1991 Primary osteoarthritis, unspecified site: Secondary | ICD-10-CM | POA: Diagnosis not present

## 2022-05-08 DIAGNOSIS — D631 Anemia in chronic kidney disease: Secondary | ICD-10-CM | POA: Diagnosis not present

## 2022-05-08 DIAGNOSIS — R29818 Other symptoms and signs involving the nervous system: Secondary | ICD-10-CM | POA: Diagnosis not present

## 2022-05-08 DIAGNOSIS — I63231 Cerebral infarction due to unspecified occlusion or stenosis of right carotid arteries: Secondary | ICD-10-CM | POA: Diagnosis present

## 2022-05-08 DIAGNOSIS — R531 Weakness: Secondary | ICD-10-CM | POA: Diagnosis not present

## 2022-05-08 DIAGNOSIS — I639 Cerebral infarction, unspecified: Secondary | ICD-10-CM | POA: Diagnosis present

## 2022-05-08 DIAGNOSIS — Z7902 Long term (current) use of antithrombotics/antiplatelets: Secondary | ICD-10-CM | POA: Diagnosis not present

## 2022-05-08 DIAGNOSIS — M170 Bilateral primary osteoarthritis of knee: Secondary | ICD-10-CM | POA: Diagnosis present

## 2022-05-08 DIAGNOSIS — I6501 Occlusion and stenosis of right vertebral artery: Secondary | ICD-10-CM | POA: Diagnosis present

## 2022-05-08 DIAGNOSIS — E6609 Other obesity due to excess calories: Secondary | ICD-10-CM | POA: Diagnosis not present

## 2022-05-08 DIAGNOSIS — I6523 Occlusion and stenosis of bilateral carotid arteries: Secondary | ICD-10-CM | POA: Diagnosis not present

## 2022-05-08 DIAGNOSIS — G8324 Monoplegia of upper limb affecting left nondominant side: Secondary | ICD-10-CM | POA: Diagnosis present

## 2022-05-08 DIAGNOSIS — Z6832 Body mass index (BMI) 32.0-32.9, adult: Secondary | ICD-10-CM | POA: Diagnosis not present

## 2022-05-08 DIAGNOSIS — N189 Chronic kidney disease, unspecified: Secondary | ICD-10-CM | POA: Diagnosis not present

## 2022-05-08 DIAGNOSIS — R1312 Dysphagia, oropharyngeal phase: Secondary | ICD-10-CM | POA: Diagnosis present

## 2022-05-08 DIAGNOSIS — I10 Essential (primary) hypertension: Secondary | ICD-10-CM | POA: Diagnosis not present

## 2022-05-08 DIAGNOSIS — E1122 Type 2 diabetes mellitus with diabetic chronic kidney disease: Secondary | ICD-10-CM | POA: Diagnosis present

## 2022-05-08 DIAGNOSIS — I771 Stricture of artery: Secondary | ICD-10-CM | POA: Diagnosis not present

## 2022-05-08 LAB — LIPID PANEL
Cholesterol: 92 mg/dL (ref 0–200)
HDL: 39 mg/dL — ABNORMAL LOW (ref >40)
LDL Cholesterol: 24 mg/dL (ref 0–99)
Total CHOL/HDL Ratio: 2.4 RATIO
Triglycerides: 144 mg/dL (ref <150)
VLDL: 29 mg/dL (ref 0–40)

## 2022-05-08 LAB — BASIC METABOLIC PANEL
Anion gap: 5 (ref 5–15)
BUN: 22 mg/dL (ref 8–23)
CO2: 25 mmol/L (ref 22–32)
Calcium: 9.3 mg/dL (ref 8.9–10.3)
Chloride: 111 mmol/L (ref 98–111)
Creatinine, Ser: 1.38 mg/dL — ABNORMAL HIGH (ref 0.61–1.24)
GFR, Estimated: 52 mL/min — ABNORMAL LOW (ref >60)
Glucose, Bld: 91 mg/dL (ref 70–99)
Potassium: 4 mmol/L (ref 3.5–5.1)
Sodium: 141 mmol/L (ref 135–145)

## 2022-05-08 LAB — GLUCOSE, CAPILLARY
Glucose-Capillary: 93 mg/dL (ref 70–99)
Glucose-Capillary: 116 mg/dL — ABNORMAL HIGH (ref 70–99)

## 2022-05-08 LAB — HEMOGLOBIN A1C
Hgb A1c MFr Bld: 6.1 % — ABNORMAL HIGH (ref 4.8–5.6)
Mean Plasma Glucose: 128 mg/dL

## 2022-05-08 MED ORDER — EZETIMIBE 10 MG PO TABS: 10.0000 mg | ORAL_TABLET | Freq: Every day | ORAL | 11 refills | Status: DC

## 2022-05-08 MED ORDER — ASPIRIN 325 MG PO TABS: 325.0000 mg | ORAL_TABLET | Freq: Every day | ORAL | 2 refills | Status: DC

## 2022-05-08 MED ORDER — DICLOFENAC SODIUM 1 % EX GEL: 4.0000 g | Freq: Four times a day (QID) | CUTANEOUS | Status: DC | PRN

## 2022-05-08 MED ORDER — PANTOPRAZOLE SODIUM 40 MG PO TBEC: 40.0000 mg | DELAYED_RELEASE_TABLET | Freq: Every day | ORAL | 1 refills | Status: DC

## 2022-05-08 MED ORDER — CELECOXIB 400 MG PO CAPS: 400.0000 mg | ORAL_CAPSULE | Freq: Every day | ORAL | Status: DC | PRN

## 2022-05-08 MED ORDER — CLOPIDOGREL BISULFATE 75 MG PO TABS: 75.0000 mg | ORAL_TABLET | Freq: Every day | ORAL | 2 refills | Status: DC

## 2022-05-08 NOTE — Plan of Care (Signed)
Problem: Education: Goal: Knowledge of disease or condition will improve Outcome: Adequate for Discharge Goal: Knowledge of secondary prevention will improve (MUST DOCUMENT ALL) Outcome: Adequate for Discharge Goal: Knowledge of patient specific risk factors will improve [place] N/A or DELETE if not current risk factor) Outcome: Adequate for Discharge   Problem: Ischemic Stroke/TIA Tissue Perfusion: Goal: Complications of ischemic stroke/TIA will be minimized Outcome: Adequate for Discharge   Problem: Coping: Goal: Will verbalize positive feelings about self Outcome: Adequate for Discharge Goal: Will identify appropriate support needs Outcome: Adequate for Discharge   Problem: Health Behavior/Discharge Planning: Goal: Ability to manage health-related needs will improve Outcome: Adequate for Discharge Goal: Goals will be collaboratively established with patient/family Outcome: Adequate for Discharge   Problem: Self-Care: Goal: Ability to participate in self-care as condition permits will improve Outcome: Adequate for Discharge Goal: Verbalization of feelings and concerns over difficulty with self-care will improve Outcome: Adequate for Discharge Goal: Ability to communicate needs accurately will improve Outcome: Adequate for Discharge   Problem: Nutrition: Goal: Risk of aspiration will decrease Outcome: Adequate for Discharge Goal: Dietary intake will improve Outcome: Adequate for Discharge   Problem: Education: Goal: Knowledge of General Education information will improve Description: Including pain rating scale, medication(s)/side effects and non-pharmacologic comfort measures Outcome: Adequate for Discharge   Problem: Health Behavior/Discharge Planning: Goal: Ability to manage health-related needs will improve Outcome: Adequate for Discharge   Problem: Clinical Measurements: Goal: Ability to maintain clinical measurements within normal limits will improve Outcome:  Adequate for Discharge Goal: Will remain free from infection Outcome: Adequate for Discharge Goal: Diagnostic test results will improve Outcome: Adequate for Discharge Goal: Respiratory complications will improve Outcome: Adequate for Discharge Goal: Cardiovascular complication will be avoided Outcome: Adequate for Discharge   Problem: Activity: Goal: Risk for activity intolerance will decrease Outcome: Adequate for Discharge   Problem: Nutrition: Goal: Adequate nutrition will be maintained Outcome: Adequate for Discharge   Problem: Coping: Goal: Level of anxiety will decrease Outcome: Adequate for Discharge   Problem: Elimination: Goal: Will not experience complications related to bowel motility Outcome: Adequate for Discharge Goal: Will not experience complications related to urinary retention Outcome: Adequate for Discharge   Problem: Pain Managment: Goal: General experience of comfort will improve Outcome: Adequate for Discharge   Problem: Safety: Goal: Ability to remain free from injury will improve Outcome: Adequate for Discharge   Problem: Skin Integrity: Goal: Risk for impaired skin integrity will decrease Outcome: Adequate for Discharge   Problem: Education: Goal: Ability to describe self-care measures that may prevent or decrease complications (Diabetes Survival Skills Education) will improve Outcome: Adequate for Discharge Goal: Individualized Educational Video(s) Outcome: Adequate for Discharge   Problem: Coping: Goal: Ability to adjust to condition or change in health will improve Outcome: Adequate for Discharge   Problem: Fluid Volume: Goal: Ability to maintain a balanced intake and output will improve Outcome: Adequate for Discharge   Problem: Health Behavior/Discharge Planning: Goal: Ability to identify and utilize available resources and services will improve Outcome: Adequate for Discharge Goal: Ability to manage health-related needs will  improve Outcome: Adequate for Discharge   Problem: Metabolic: Goal: Ability to maintain appropriate glucose levels will improve Outcome: Adequate for Discharge   Problem: Nutritional: Goal: Maintenance of adequate nutrition will improve Outcome: Adequate for Discharge Goal: Progress toward achieving an optimal weight will improve Outcome: Adequate for Discharge   Problem: Skin Integrity: Goal: Risk for impaired skin integrity will decrease Outcome: Adequate for Discharge   Problem: Tissue Perfusion: Goal: Adequacy  of tissue perfusion will improve Outcome: Adequate for Discharge   

## 2022-05-08 NOTE — Discharge Summary (Signed)
Physician Discharge Summary   Patient: Patrick Moss MRN: [id number] DOB: 1940/12/30  Admit date:     05/07/2022  Discharge date: 05/08/22  Discharge Physician: Barton Dubois   PCP: [name], MD   Recommendations at discharge:  Repeat basic metabolic panel to follow electrolytes renal function Reassess blood pressure and adjust antihypertensive treatment as needed Make sure patient follow-up as instructed with interventional radiology and neurology service. If no stenting plan for symptomatic carotid stenosis neurology is recommended for 21 days of dual antiplatelet therapy, with intention to continue the use of Plavix after that.  If a stent is placed then treatment will be extended for 3 months.  Discharge Diagnoses: Principal Problem:   Acute ischemic stroke [facility]) Active Problems:   Essential hypertension, benign   Class 1 obesity due to excess calories without serious comorbidity with body mass index (BMI) of 32.0 to 32.9 in adult   Mixed hyperlipidemia   Type 2 diabetes with nephropathy (HCC)   GERD (gastroesophageal reflux disease)   OA (osteoarthritis)  Hospital Course: Patrick Moss is a 81 y.o. male with medical history significant of hypertension, hyperlipidemia, chronic kidney disease stage III a, type 2 diabetes with nephropathy, osteoarthritis and history of malignant neoplasm of the prostate; who presented to the hospital secondary to right facial droop and slurred speech.  Symptoms present around 9 PM prior to go to bed (very mild and patient thought will call away on its own).  After awakening this morning patient symptoms still present and is slightly more pronounced; he presented to the emergency department for further evaluation and management.     Denies chest pain, nausea, vomiting, shortness of breath, fever, chills, dysuria, hematuria, melena, hematochezia, and denies any other focal neurologic deficits or complaints.   Patient last seen normal around 9  PM and the day prior to admission.  Out of therapeutic window for tPA.  Workup in the ED demonstrating 2 small acute/subacute infarcts in the right corona radiata.  No hemorrhagic changes.   Neurology service consulted and [facility] contacted to place patient in the hospital for further evaluation and management.  Assessment and Plan: * Acute ischemic stroke (HCC) -Acute/subacute stroke appreciated on MRI at time of admission. -2D echo with preserved ejection fraction, no wall motion abnormalities and no appreciated thrombi. -Carotid Dopplers with right-sided carotid stenosis for what neurology is recommending evaluation by interventional radiology for potential stenting.   -At time of discharge improvement in patient's neurologic deficit appreciated; safe to go home without home health services. -Dual antiplatelet therapy using full dose aspirin and Plavix recommended for 21 days and subsequent use of Plavix for secondary prevention.  In the event of a stent placed for symptomatic carotid stenosis then treatment will be extended for 41-month   -Continue the use of a statin and Zetia as patient demonstrating mixed HLD. -Heart healthy diet discussed with patient. -Continue further risk factor modification with good diabetes and blood pressure control.   -Follow-up with neurology in 370-month   OA (osteoarthritis) -Continue as needed analgesics. -Expressing chronic difficulties with ambulation due to pain on his right lower extremity. -Continue the use of cane to assist with ambulation.  GERD (gastroesophageal reflux disease) -Protonix 40 mg daily started for GI prophylaxis. -Lifestyle changes discussed with patient.  Type 2 diabetes with nephropathy (HCC) -A1c 6.1 -Resume home hypoglycemic regimen. -Modified carbohydrate diet discussed with patient.  Mixed hyperlipidemia -Continue statin -zetia daily started at discharge -Repeat lipid panel and LFTs in 3 months.  Class 1 obesity due to  excess calories without serious comorbidity with body mass index (BMI) of 32.0 to 32.9 in adult -Low calorie diet, portion control and increase physical activity discussed with patient. -Patient expressed having lost some weight already while watching diet. -Body mass index is 28.57 kg/m.   Essential hypertension, benign -Permissive hypertension was allowed in the first 24 hours after admission -Home antihypertensive agents resumed at discharge -Heart healthy diet discussed with patient. -Reassess blood pressure at follow-up visit.  History of prostate cancer -Continue patient follow-up with urology service -Status post radiation.  Chronic kidney disease a stage III a -Appears to be stable and at baseline. -Continue to maintain adequate hydration and minimize the use of nephrotoxic agent -Repeat basic metabolic panel to follow ultralights and renal function.  Consultants: Neurology service Procedures performed: See below for x-ray reports. Disposition: Home Diet recommendation: Heart healthy modified carbohydrate diet.  DISCHARGE MEDICATION: Allergies as of 05/08/2022       Reactions   Septra [sulfamethoxazole-trimethoprim] Hives        Medication List     TAKE these medications    amLODipine 5 MG tablet Commonly known as: NORVASC Take 1 tablet (5 mg total) by mouth daily.   aspirin 325 MG tablet Take 1 tablet (325 mg total) by mouth daily. Start taking on: May 09, 2022 What changed:  medication strength how much to take when to take this   B-D ULTRAFINE III SHORT PEN 31G X 8 MM Misc Generic drug: Insulin Pen Needle USE  1  PEN  NEEDLE AS DIRECTED   Basaglar KwikPen 100 UNIT/ML Inject 30 Units into the skin daily. What changed: when to take this   calcium carbonate 1500 (600 Ca) MG Tabs tablet Commonly known as: OSCAL Take 1,500 mg by mouth 2 (two) times daily with a meal.   celecoxib 400 MG capsule Commonly known as: CELEBREX Take 1 capsule  (400 mg total) by mouth daily as needed for pain (severe pain). With food What changed:  when to take this reasons to take this   clopidogrel 75 MG tablet Commonly known as: PLAVIX Take 1 tablet (75 mg total) by mouth daily. Start taking on: May 09, 2022   diclofenac Sodium 1 % Gel Commonly known as: Voltaren Apply 4 g topically 4 (four) times daily as needed. For the knee   ezetimibe 10 MG tablet Commonly known as: Zetia Take 1 tablet (10 mg total) by mouth daily.   metFORMIN 500 MG 24 hr tablet Commonly known as: GLUCOPHAGE-XR TAKE 2 TABLETS EVERY DAY AFTER BREAKFAST What changed: See the new instructions.   omega-3 fish oil 1000 MG Caps capsule Commonly known as: MAXEPA Take 1 capsule by mouth daily.   pantoprazole 40 MG tablet Commonly known as: Protonix Take 1 tablet (40 mg total) by mouth daily.   rosuvastatin 5 MG tablet Commonly known as: CRESTOR Take 1 tablet (5 mg total) by mouth daily.   triamcinolone cream 0.1 % Commonly known as: KENALOG Apply 1 application. topically 3 (three) times daily. For the ankle area   True Metrix Blood Glucose Test test strip Generic drug: glucose blood TEST BLOOD SUGAR TWO TO THREE TIMES DAILY Dx O70.96   Trulicity 1.5 GE/3.6OQ Sopn Generic drug: Dulaglutide Inject 1.5 mg into the skin once a week. What changed: additional instructions   valsartan-hydrochlorothiazide 320-25 MG tablet Commonly known as: DIOVAN-HCT Take 1 tablet by mouth daily.   Vitamin D3 50 MCG (2000 UT) Tabs Take 1 capsule by  mouth daily.        Follow-up Information     [name], [name], MD. Schedule an appointment as soon as possible for a visit in 10 day(s).   Specialty: Family Medicine Contact information: [place] [address] [phone]         Pedro Earls, MD Follow up today.   Specialties: Radiology, Interventional Radiology Why: office will call you with appointment details. Contact  information: [place] [address] [phone]         Phillips Odor, MD. Schedule an appointment as soon as possible for a visit in 3 month(s).   Specialty: Neurology Contact information: [place] [address] [phone]                Discharge Exam: Danley Danker Weights   05/07/22 1229  Weight: 80.3 kg   General exam: Alert, awake, oriented x 3; improve speech appreciated on examination.  Patient is still demonstrating mild right facial droop and expressing some right facial numbness.  No other deficits reported. Respiratory system: Clear to auscultation. Respiratory effort normal.  Good saturation on room air. Cardiovascular system:RRR. No rubs or gallops; no JVD. Gastrointestinal system: Abdomen is nondistended, soft and nontender. No organomegaly or masses felt. Normal bowel sounds heard. Central nervous system: Right-sided facial numbness and mild facial droop appreciated on examination.  No other focal deficits observed. Extremities: No cyanosis or clubbing; no edema. Skin: No rashes or petechiae. Psychiatry: Judgement and insight appear normal. Mood & affect appropriate.    Condition at discharge: Stable and improved.  The results of significant diagnostics from this hospitalization (including imaging, microbiology, ancillary and laboratory) are listed below for reference.   Imaging Studies: ECHOCARDIOGRAM COMPLETE  Result Date: 05/07/2022    ECHOCARDIOGRAM REPORT   Patient Name:   Patrick Moss Date of Exam: 05/07/2022 Medical Rec #:  [id number]       Height:       66.0 in Accession #:    [id number]      Weight:       177.0 lb Date of Birth:  05-18-1941       BSA:          1.899 m Patient Age:    65 years        BP:           107/68 mmHg Patient Gender: M               HR:           83 bpm. Exam Location:  [facility] Procedure: 2D Echo, Cardiac Doppler and Color Doppler Indications:    Stroke  History:        Patient has no prior  history of Echocardiogram examinations.                 Stroke; Risk Factors:Hypertension, Diabetes and Dyslipidemia.  Sonographer:    Wenda Low Referring Phys: Bright ZAMMIT IMPRESSIONS  1. Left ventricular ejection fraction, by estimation, is 60 to 65%. The left ventricle has normal function. The left ventricle has no regional wall motion abnormalities. Left ventricular diastolic parameters are consistent with Grade I diastolic dysfunction (impaired relaxation).  2. RV-RA gradient 24 mmHg suggesting normal estimated RVSP presuming normal CVP. Right ventricular systolic function is normal. The right ventricular size is normal.  3. Left atrial size was mildly dilated.  4. The mitral valve is grossly normal. Trivial mitral valve regurgitation.  5. The  aortic valve is tricuspid. Aortic valve regurgitation is not visualized. Aortic valve mean gradient measures 2.0 mmHg.  6. Unable to estimate CVP.  7. Hypermobile interatrial septum without obvious shunting. Consider saline contrast bubble study for further investigation. Comparison(s): No prior Echocardiogram. FINDINGS  Left Ventricle: Left ventricular ejection fraction, by estimation, is 60 to 65%. The left ventricle has normal function. The left ventricle has no regional wall motion abnormalities. The left ventricular internal cavity size was normal in size. There is  borderline left ventricular hypertrophy. Left ventricular diastolic parameters are consistent with Grade I diastolic dysfunction (impaired relaxation). Right Ventricle: RV-RA gradient 24 mmHg suggesting normal estimated RVSP presuming normal CVP. The right ventricular size is normal. No increase in right ventricular wall thickness. Right ventricular systolic function is normal. Left Atrium: Left atrial size was mildly dilated. Right Atrium: Right atrial size was normal in size. Pericardium: There is no evidence of pericardial effusion. Presence of epicardial fat layer. Mitral Valve: The  mitral valve is grossly normal. Trivial mitral valve regurgitation. MV peak gradient, 5.5 mmHg. The mean mitral valve gradient is 2.0 mmHg. Tricuspid Valve: The tricuspid valve is grossly normal. Tricuspid valve regurgitation is trivial. Aortic Valve: The aortic valve is tricuspid. There is mild aortic valve annular calcification. Aortic valve regurgitation is not visualized. Aortic valve mean gradient measures 2.0 mmHg. Aortic valve peak gradient measures 4.9 mmHg. Aortic valve area, by  VTI measures 3.11 cm. Pulmonic Valve: The pulmonic valve was grossly normal. Pulmonic valve regurgitation is trivial. Aorta: The aortic root is normal in size and structure. Venous: Unable to estimate CVP. The inferior vena cava was not well visualized. IAS/Shunts: There is redundancy of the interatrial septum. No atrial level shunt detected by color flow Doppler.  LEFT VENTRICLE PLAX 2D LVIDd:         4.90 cm   Diastology LVIDs:         3.30 cm   LV e' medial:    5.98 cm/s LV PW:         1.00 cm   LV E/e' medial:  9.5 LV IVS:        1.00 cm   LV e' lateral:   9.46 cm/s LVOT diam:     2.00 cm   LV E/e' lateral: 6.0 LV SV:         70 LV SV Index:   37 LVOT Area:     3.14 cm  RIGHT VENTRICLE RV Basal diam:  3.05 cm RV Mid diam:    2.70 cm RV S prime:     14.50 cm/s LEFT ATRIUM             Index        RIGHT ATRIUM           Index LA diam:        4.00 cm 2.11 cm/m   RA Area:     13.90 cm LA Vol (A2C):   64.8 ml 34.13 ml/m  RA Volume:   32.40 ml  17.06 ml/m LA Vol (A4C):   70.3 ml 37.03 ml/m LA Biplane Vol: 69.2 ml 36.45 ml/m  AORTIC VALVE                    PULMONIC VALVE AV Area (Vmax):    3.00 cm     PV Vmax:       1.02 m/s AV Area (Vmean):   3.12 cm     PV Peak grad:  4.2 mmHg AV  Area (VTI):     3.11 cm AV Vmax:           111.00 cm/s AV Vmean:          69.900 cm/s AV VTI:            0.226 m AV Peak Grad:      4.9 mmHg AV Mean Grad:      2.0 mmHg LVOT Vmax:         106.00 cm/s LVOT Vmean:        69.500 cm/s LVOT VTI:           0.224 m LVOT/AV VTI ratio: 0.99  AORTA Ao Root diam: 3.60 cm MITRAL VALVE               TRICUSPID VALVE MV Area (PHT): 3.63 cm    TR Peak grad:   24.6 mmHg MV Area VTI:   2.52 cm    TR Vmax:        248.00 cm/s MV Peak grad:  5.5 mmHg MV Mean grad:  2.0 mmHg    SHUNTS MV Vmax:       1.17 m/s    Systemic VTI:  0.22 m MV Vmean:      56.3 cm/s   Systemic Diam: 2.00 cm MV Decel Time: 209 msec MV E velocity: 56.60 cm/s MV A velocity: 88.30 cm/s MV E/A ratio:  0.64 Rozann Lesches MD Electronically signed by Rozann Lesches MD Signature Date/Time: 05/07/2022/4:52:57 PM    Final    US Carotid Bilateral  Result Date: 05/07/2022 CLINICAL DATA:  81 year old male with history of stroke EXAM: BILATERAL CAROTID DUPLEX ULTRASOUND TECHNIQUE: Pearline Cables scale imaging, color Doppler and duplex ultrasound were performed of bilateral carotid and vertebral arteries in the neck. COMPARISON:  None Available. FINDINGS: Criteria: Quantification of carotid stenosis is based on velocity parameters that correlate the residual internal carotid diameter with NASCET-based stenosis levels, using the diameter of the distal internal carotid lumen as the denominator for stenosis measurement. The following velocity measurements were obtained: RIGHT ICA:  Systolic 017 cm/sec, Diastolic 28 cm/sec CCA:  97 cm/sec SYSTOLIC ICA/CCA RATIO:  1.5 ECA:  143 cm/sec LEFT ICA:  Systolic 80 cm/sec, Diastolic 18 cm/sec CCA:  89 cm/sec SYSTOLIC ICA/CCA RATIO:  0.9 ECA:  127 cm/sec Right Brachial SBP: Not acquired Left Brachial SBP: Not acquired RIGHT CAROTID ARTERY: No significant calcifications of the right common carotid artery. Intermediate waveform maintained. Heterogeneous, irregular, and partially calcified plaque at the right carotid bifurcation. No significant lumen shadowing. Low resistance waveform of the right ICA. No significant tortuosity. RIGHT VERTEBRAL ARTERY: Antegrade flow with low resistance waveform. LEFT CAROTID ARTERY: No significant  calcifications of the left common carotid artery. Intermediate waveform maintained. Heterogeneous and partially calcified plaque at the left carotid bifurcation without significant lumen shadowing. Low resistance waveform of the left ICA. No significant tortuosity. LEFT VERTEBRAL ARTERY:  Antegrade flow with low resistance waveform. IMPRESSION: Right: Heterogeneous and partially calcified plaque at the right carotid bifurcation contributes to 50%-69% stenosis by established duplex criteria. Left: Color duplex indicates minimal heterogeneous and calcified plaque, with no hemodynamically significant stenosis by duplex criteria in the extracranial cerebrovascular circulation. Signed, Dulcy Fanny. Nadene Rubins, RPVI [facility] Radiology Electronically Signed   By: Corrie Mckusick D.O.   On: 05/07/2022 15:54   MR BRAIN WO CONTRAST  Result Date: 05/07/2022 CLINICAL DATA:  Altered mental status EXAM: MRI HEAD WITHOUT CONTRAST MRA HEAD WITHOUT CONTRAST TECHNIQUE: Multiplanar, multi-echo  pulse sequences of the brain and surrounding structures were acquired without intravenous contrast. Angiographic images of the Circle of Willis were acquired using MRA technique without intravenous contrast. COMPARISON:  None Available. FINDINGS: MRI HEAD FINDINGS Brain: There are 2 small foci of diffusion restriction with associated FLAIR signal abnormality in the right corona radiata consistent with acute to early subacute infarcts. There is no associated hemorrhage or mass effect. There is no other evidence of acute infarct. There is no acute intracranial hemorrhage or extra-axial fluid collection There is mild background parenchymal volume loss with prominence of the ventricular system and extra-axial CSF spaces. There are scattered small foci of FLAIR signal abnormality in the remainder of the supratentorial white matter which are nonspecific but likely reflects sequela of mild  chronic small-vessel ischemic change There is no mass lesion.  There is no mass effect or midline shift. Vascular: See below. Skull and upper cervical spine: Normal marrow signal. Sinuses/Orbits: The paranasal sinuses are clear. Bilateral lens implants are in place. The globes and orbits are otherwise unremarkable. Other: None. MRA HEAD FINDINGS Anterior circulation: The intracranial ICAs are patent, without significant atherosclerotic irregularity or significant stenosis. The bilateral MCAs are patent, without proximal stenosis or occlusion. The bilateral ACAs are patent, without proximal stenosis or occlusion. The anterior communicating artery is normal. There is no aneurysm or AVM. Posterior circulation: The bilateral V4 segments are patent. The basilar artery is patent. The major cerebellar arteries appear patent. The bilateral PCAs are patent, without proximal stenosis or occlusion. There is no aneurysm or AVM. Anatomic variants: None. IMPRESSION: 1. Two small acute to early subacute infarcts in the right corona radiata. 2. Background parenchymal volume loss and mild chronic small-vessel ischemic change. 3.  Normal intracranial vasculature. Electronically Signed   By: Valetta Mole M.D.   On: 05/07/2022 14:03   MR ANGIO HEAD WO CONTRAST  Result Date: 05/07/2022 CLINICAL DATA:  Altered mental status EXAM: MRI HEAD WITHOUT CONTRAST MRA HEAD WITHOUT CONTRAST TECHNIQUE: Multiplanar, multi-echo pulse sequences of the brain and surrounding structures were acquired without intravenous contrast. Angiographic images of the Circle of Willis were acquired using MRA technique without intravenous contrast. COMPARISON:  None Available. FINDINGS: MRI HEAD FINDINGS Brain: There are 2 small foci of diffusion restriction with associated FLAIR signal abnormality in the right corona radiata consistent with acute to early subacute infarcts. There is no associated hemorrhage or mass effect. There is no other evidence of acute  infarct. There is no acute intracranial hemorrhage or extra-axial fluid collection There is mild background parenchymal volume loss with prominence of the ventricular system and extra-axial CSF spaces. There are scattered small foci of FLAIR signal abnormality in the remainder of the supratentorial white matter which are nonspecific but likely reflects sequela of mild chronic small-vessel ischemic change There is no mass lesion.  There is no mass effect or midline shift. Vascular: See below. Skull and upper cervical spine: Normal marrow signal. Sinuses/Orbits: The paranasal sinuses are clear. Bilateral lens implants are in place. The globes and orbits are otherwise unremarkable. Other: None. MRA HEAD FINDINGS Anterior circulation: The intracranial ICAs are patent, without significant atherosclerotic irregularity or significant stenosis. The bilateral MCAs are patent, without proximal stenosis or occlusion. The bilateral ACAs are patent, without proximal stenosis or occlusion. The anterior communicating artery is normal. There is no aneurysm or AVM. Posterior circulation: The bilateral V4 segments are patent. The basilar artery is patent. The major cerebellar arteries appear patent. The bilateral PCAs are patent, without proximal stenosis  or occlusion. There is no aneurysm or AVM. Anatomic variants: None. IMPRESSION: 1. Two small acute to early subacute infarcts in the right corona radiata. 2. Background parenchymal volume loss and mild chronic small-vessel ischemic change. 3.  Normal intracranial vasculature. Electronically Signed   By: Valetta Mole M.D.   On: 05/07/2022 14:03    Microbiology: Results for orders placed or performed in visit on 12/31/21  Microscopic Examination     Status: None   Collection Time: 12/31/21  2:16 PM   Urine  Result Value Ref Range Status   WBC, UA None seen 0 - 5 /hpf Final   RBC, Urine None seen 0 - 2 /hpf Final   Epithelial Cells (non renal) 0-10 0 - 10 /hpf Final   Renal  Epithel, UA None seen None seen /hpf Final   Mucus, UA Present Not Estab. Final   Bacteria, UA None seen None seen/Few Final    Labs: CBC: Recent Labs  Lab 05/07/22 1258  WBC 6.8  NEUTROABS 5.3  HGB 12.6*  HCT 37.2*  MCV 93.2  PLT 953   Basic Metabolic Panel: Recent Labs  Lab 05/07/22 1258 05/08/22 0447  NA 139 141  K 3.8 4.0  CL 105 111  CO2 25 25  GLUCOSE 132* 91  BUN 24* 22  CREATININE 1.35* 1.38*  CALCIUM 9.8 9.3   Liver Function Tests: Recent Labs  Lab 05/07/22 1258  AST 24  ALT 32  ALKPHOS 73  BILITOT 0.8  PROT 7.6  ALBUMIN 4.1   CBG: Recent Labs  Lab 05/07/22 1243 05/07/22 1822 05/07/22 2104 05/08/22 0749 05/08/22 1136  GLUCAP 138* 93 127* 93 116*    Discharge time spent: greater than 30 minutes.  Signed: Barton Dubois, MD [facility] 05/08/2022

## 2022-05-08 NOTE — Progress Notes (Signed)
Patient stable throughout the night, was able to go the bathroom without issue. Speech is still slightly slurred but clear enough to understand. Denies pain or weakness in arms or legs.  Seward Meth, RN

## 2022-05-08 NOTE — Consult Note (Addendum)
I connected with  Patrick Moss on 05/08/22 by a video enabled telemedicine application and verified that I am speaking with the correct person using two identifiers.   I discussed the limitations of evaluation and management by telemedicine. The patient expressed understanding and agreed to proceed.   Location of patient: [facility] Location of hospital: [facility]   Neurology Consultation Reason for Consult: stroke Referring Physician: Dr Milton Ferguson  CC: facial droop  History is obtained from: patient, chart review  HPI: Patrick Moss is a 81 y.o. male with PMH of HTN, HLD, DM who presented with facial droop. Patient states he noticed his face was pulling on right side and he had trouble drinking water before going to beed on 05/06/2022 at around 10pm. Next morning when symptoms persisted, he came to ED. Takes ASA '81mg'$  daily. Denies similar symptoms in past.   LKN 05/06/2022 ~ 10pm Event happened at home No tpa as outside window No thrombectomy as no LVO mRS 0  ROS: All other systems reviewed and negative except as noted in the HPI.    Past Medical History:  Diagnosis Date   Borderline glaucoma of both eyes    BPH associated with nocturia    CKD (chronic kidney disease), stage II    Complication of anesthesia    per pt emergent hiccups   Hyperlipidemia, mixed    Hypertension    Malignant neoplasm prostate ([place]) 11/2021   primary urologist---  dr dahl[place]/  radiation oncology [facility];  dx 06/ 2023,  Gleason 4+3,  PSA  13.2   OA (osteoarthritis)    knees   Type 2 diabetes mellitus treated with insulin ([place])    followed by pcp ( previous seen by endocriologist--- dr g. nida ,lov note in epic 02-19-2021);    (02-11-2022  pt stated checks blood sugar twice daily,  fasting sugar average 90s  to 120s)   Wears dentures    upper   Wears glasses     Family History  Problem Relation Age of Onset   Diabetes Mother    Diabetes Father    Diabetes  Brother     Social History:  reports that he has never smoked. He has never used smokeless tobacco. He reports that he does not drink alcohol and does not use drugs.   Medications Prior to Admission  Medication Sig Dispense Refill Last Dose   amLODipine (NORVASC) 5 MG tablet Take 1 tablet (5 mg total) by mouth daily. 90 tablet 2    aspirin 81 MG tablet Take 81 mg by mouth at bedtime.      B-D ULTRAFINE III SHORT PEN 31G X 8 MM MISC USE  1  PEN  NEEDLE AS DIRECTED 270 each 3    calcium carbonate (OSCAL) 1500 (600 Ca) MG TABS tablet Take 1,500 mg by mouth 2 (two) times daily with a meal.      celecoxib (CELEBREX) 400 MG capsule Take 1 capsule (400 mg total) by mouth daily. With food 90 capsule 3    Cholecalciferol (VITAMIN D3) 50 MCG (2000 UT) TABS Take 1 capsule by mouth daily.      diclofenac Sodium (VOLTAREN) 1 % GEL Apply 4 g topically 4 (four) times daily. For the knee (Patient taking differently: Apply 4 g topically 4 (four) times daily as needed. For the knee)      Dulaglutide (TRULICITY) 1.5 RA/0.7MA SOPN Inject 1.5 mg into the skin once a week. (Patient taking differently: Inject 1.5  mg into the skin once a week. Saturday's) 6 mL 4    glucose blood (TRUE METRIX BLOOD GLUCOSE TEST) test strip TEST BLOOD SUGAR TWO TO THREE TIMES DAILY Dx E11.65 300 each 3    Insulin Glargine (BASAGLAR KWIKPEN) 100 UNIT/ML Inject 30 Units into the skin daily. (Patient taking differently: Inject 30 Units into the skin at bedtime.) 45 mL 4    metFORMIN (GLUCOPHAGE-XR) 500 MG 24 hr tablet TAKE 2 TABLETS EVERY DAY AFTER BREAKFAST (Patient taking differently: 1,000 mg daily with breakfast.) 180 tablet 0    omega-3 fish oil (MAXEPA) 1000 MG CAPS capsule Take 1 capsule by mouth daily.      rosuvastatin (CRESTOR) 5 MG tablet Take 1 tablet (5 mg total) by mouth daily. 90 tablet 2    triamcinolone cream (KENALOG) 0.1 % Apply 1 application. topically 3 (three) times daily. For the ankle area 45 g 0     valsartan-hydrochlorothiazide (DIOVAN-HCT) 320-25 MG tablet Take 1 tablet by mouth daily. 90 tablet 2       Exam: Current vital signs: BP 124/78 (BP Location: Right Arm)   Pulse 67   Temp 97.8 F (36.6 C) (Oral)   Resp 20   Ht '5\' 6"'$  (1.676 m)   Wt 80.3 kg   SpO2 97%   BMI 28.57 kg/m  Vital signs in last 24 hours: Temp:  [97.5 F (36.4 C)-99 F (37.2 C)] 97.8 F (36.6 C) (11/23 0421) Pulse Rate:  [67-99] 67 (11/23 0421) Resp:  [14-20] 20 (11/23 0421) BP: (101-134)/(54-82) 124/78 (11/23 0421) SpO2:  [95 %-98 %] 97 % (11/23 0421) Weight:  [80.3 kg] 80.3 kg (11/22 1229)   Physical Exam  Constitutional: Appears well-developed and well-nourished.  Psych: Affect appropriate to situation Neuro: Aox3, no aphasia, CN 2-12 grossly intact except flattening off left nasolabial fold, antigravity strength without drift in all extremities, sensation intact to light touch, FTN intact BL   NIHSS 1  INPUTS: 1A: Level of consciousness --> 0 = Alert; keenly responsive 1B: Ask month and age --> 0 = Both questions right 1C: 'Blink eyes' & 'squeeze hands' --> 0 = Performs both tasks 2: Horizontal extraocular movements --> 0 = Normal 3: Visual fields --> 0 = No visual loss 4: Facial palsy --> 1 = Minor paralysis (flat nasolabial fold, smile asymmetry) 5A: Left arm motor drift --> 0 = No drift for 10 seconds 5B: Right arm motor drift --> 0 = No drift for 10 seconds 6A: Left leg motor drift --> 0 = No drift for 5 seconds 6B: Right leg motor drift --> 0 = No drift for 5 seconds 7: Limb Ataxia --> 0 = No ataxia 8: Sensation --> 0 = Normal; no sensory loss 9: Language/aphasia --> 0 = Normal; no aphasia 10: Dysarthria --> 0 = Normal 11: Extinction/inattention --> 0 = No abnormality   I have reviewed labs in epic and the results pertinent to this consultation are: CBC:  Recent Labs  Lab 05/07/22 1258  WBC 6.8  NEUTROABS 5.3  HGB 12.6*  HCT 37.2*  MCV 93.2  PLT 261    Basic  Metabolic Panel:  Lab Results  Component Value Date   NA 141 05/08/2022   K 4.0 05/08/2022   CO2 25 05/08/2022   GLUCOSE 91 05/08/2022   BUN 22 05/08/2022   CREATININE 1.38 (H) 05/08/2022   CALCIUM 9.3 05/08/2022   GFRNONAA 52 (L) 05/08/2022   GFRAA 52 (L) 07/26/2020   Lipid Panel:  Lab Results  Component Value Date   LDLCALC 47 01/09/2022   HgbA1c:  Lab Results  Component Value Date   HGBA1C 6.1 (H) 05/07/2022   Urine Drug Screen: No results found for: "LABOPIA", "COCAINSCRNUR", "LABBENZ", "AMPHETMU", "THCU", "LABBARB"  Alcohol Level No results found for: "ETH"   I have reviewed the images obtained:  MRI Brain wo contrast 05/07/2022: Two small acute to early subacute infarcts in the right corona radiata. Background parenchymal volume loss and mild chronic small-vessel ischemic change.  MRA head wo contrast 05/07/2022: Normal intracranial vasculature.   Carotid US 05/07/2022:  Right: Heterogeneous and partially calcified plaque at the right carotid bifurcation contributes to 50%-69% stenosis by established duplex criteria.   Left: Color duplex indicates minimal heterogeneous and calcified plaque, with no hemodynamically significant stenosis by duplex criteria in the extracranial cerebrovascular circulation.   TTE 05/07/2022: No thrombus, No PFO      ASSESSMENT/PLAN: 81yo m with sudden onset facial weakness and MRI brain showing acute right corona radiata stroke   Acute ischemic stroke, right corona radiata Carotid stenosis, right HTN HLD DM - etiology: likely atheroembolic vs cardioembolic  Recommendations: - Increase asa to '325mg'$  daily and add plavix '75mg'$  daily, continue till f/u with Dr Debbrah Alar. If patient gets carotid stent, will likely continue plavix for 3 months. If note, will be 21 days.  - Recommend f/u with Dr Thomasena Edis for symptomatic carotid stenosis. Her office will call tomorrow to schedule f/u - Continue atorva '40mg'$  daily - 30 day  event monitor to look for paroxysmal A fib - Goal BP: normotension - PT/OT/SLP - Stroke education - f/u with neuro in 58month   Thank you for allowing [facility] participate in the care of this patient. If you have any further questions, please contact  me or neurohospitalist.   PZeb Comfort[facility]ilepsy [facility] neurohospitalist

## 2022-05-08 NOTE — Progress Notes (Signed)
  Transition of Care (TOC) Screening Note   Patient Details  Name: Patrick Moss Date of Birth: 02-Jan-1941   Transition of Care [facility]) CM/SW Contact:    Iona Beard, [place] Phone Number: 05/08/2022, 11:11 AM    Transition of Care Department [facility]) has reviewed patient and no TOC needs have been identified at this time. We will continue to monitor patient advancement through interdisciplinary progression rounds. If new patient transition needs arise, please place a TOC consult.

## 2022-05-08 NOTE — Care Management Obs Status (Signed)
[place] NOTIFICATION   Patient Details  Name: Patrick Moss MRN: [id number] Date of Birth: 04-20-1941   Medicare Observation Status Notification Given:  Yes    Iona Beard, [place] 05/08/2022, 10:11 AM

## 2022-05-09 ENCOUNTER — Telehealth: Payer: Self-pay

## 2022-05-09 DIAGNOSIS — I639 Cerebral infarction, unspecified: Secondary | ICD-10-CM

## 2022-05-09 LAB — URINE CULTURE: Culture: NO GROWTH

## 2022-05-09 NOTE — Telephone Encounter (Signed)
Transition Care Management Follow-up Telephone Call Date of discharge and from where: 05/08/22; [facility] How have you been since you were released from the hospital? Stable; speech is slurred  Any questions or concerns? No  Items Reviewed: Did the pt receive and understand the discharge instructions provided? Yes  Medications obtained and verified? Yes  Other? No  Any new allergies since your discharge? No  Dietary orders reviewed? Yes Do you have support at home? Yes   Home Care and Equipment/Supplies: Were home health services ordered? no If so, what is the name of the agency? N/a   Has the agency set up a time to come to the patient's home? not applicable Were any new equipment or medical supplies ordered?  No What is the name of the medical supply agency? N/a  Were you able to get the supplies/equipment? not applicable Do you have any questions related to the use of the equipment or supplies? No  Functional Questionnaire: (I = Independent and D = Dependent) ADLs: I  Bathing/Dressing- I  Meal Prep- I  Eating- I  Maintaining continence- I  Transferring/Ambulation- I  Managing Meds- I  Follow up appointments reviewed:  PCP Hospital f/u appt confirmed? Yes  Scheduled to see Dr. Livia Snellen  on 05/14/22 @ 8:25. Patient currently scheduled in a follow up slot is it ok to change over to a hospital follow up instead?  [facility] f/u appt confirmed? No to see Neurology-Dr. Merlene Laughter and Interventional Radiology  Are transportation arrangements needed? No  If their condition worsens, is the pt aware to call PCP or go to the Emergency Dept.? Yes Was the patient provided with contact information for the PCP's office or ED? Yes Was to pt encouraged to call back with questions or concerns? Yes

## 2022-05-10 ENCOUNTER — Inpatient Hospital Stay (HOSPITAL_COMMUNITY)
Admission: EM | Admit: 2022-05-10 | Discharge: 2022-05-15 | DRG: 036 | Disposition: A | Discharge status: Home Health | Payer: Medicare HMO | Attending: Internal Medicine | Admitting: Internal Medicine

## 2022-05-10 ENCOUNTER — Emergency Department (HOSPITAL_COMMUNITY): Payer: Medicare HMO

## 2022-05-10 ENCOUNTER — Other Ambulatory Visit: Payer: Self-pay

## 2022-05-10 DIAGNOSIS — Z7985 Long-term (current) use of injectable non-insulin antidiabetic drugs: Secondary | ICD-10-CM

## 2022-05-10 DIAGNOSIS — R29818 Other symptoms and signs involving the nervous system: Secondary | ICD-10-CM | POA: Diagnosis not present

## 2022-05-10 DIAGNOSIS — R1312 Dysphagia, oropharyngeal phase: Secondary | ICD-10-CM | POA: Diagnosis present

## 2022-05-10 DIAGNOSIS — Z7902 Long term (current) use of antithrombotics/antiplatelets: Secondary | ICD-10-CM

## 2022-05-10 DIAGNOSIS — I6521 Occlusion and stenosis of right carotid artery: Secondary | ICD-10-CM | POA: Diagnosis not present

## 2022-05-10 DIAGNOSIS — E119 Type 2 diabetes mellitus without complications: Secondary | ICD-10-CM | POA: Diagnosis present

## 2022-05-10 DIAGNOSIS — Z7982 Long term (current) use of aspirin: Secondary | ICD-10-CM | POA: Diagnosis not present

## 2022-05-10 DIAGNOSIS — Z833 Family history of diabetes mellitus: Secondary | ICD-10-CM

## 2022-05-10 DIAGNOSIS — I63231 Cerebral infarction due to unspecified occlusion or stenosis of right carotid arteries: Secondary | ICD-10-CM | POA: Diagnosis not present

## 2022-05-10 DIAGNOSIS — I639 Cerebral infarction, unspecified: Secondary | ICD-10-CM | POA: Diagnosis not present

## 2022-05-10 DIAGNOSIS — E1121 Type 2 diabetes mellitus with diabetic nephropathy: Secondary | ICD-10-CM | POA: Diagnosis present

## 2022-05-10 DIAGNOSIS — I63411 Cerebral infarction due to embolism of right middle cerebral artery: Secondary | ICD-10-CM | POA: Diagnosis not present

## 2022-05-10 DIAGNOSIS — K219 Gastro-esophageal reflux disease without esophagitis: Secondary | ICD-10-CM | POA: Diagnosis present

## 2022-05-10 DIAGNOSIS — G8324 Monoplegia of upper limb affecting left nondominant side: Secondary | ICD-10-CM | POA: Diagnosis not present

## 2022-05-10 DIAGNOSIS — Z79899 Other long term (current) drug therapy: Secondary | ICD-10-CM

## 2022-05-10 DIAGNOSIS — Z8673 Personal history of transient ischemic attack (TIA), and cerebral infarction without residual deficits: Secondary | ICD-10-CM

## 2022-05-10 DIAGNOSIS — N1831 Chronic kidney disease, stage 3a: Secondary | ICD-10-CM | POA: Diagnosis not present

## 2022-05-10 DIAGNOSIS — I6503 Occlusion and stenosis of bilateral vertebral arteries: Secondary | ICD-10-CM | POA: Diagnosis not present

## 2022-05-10 DIAGNOSIS — M170 Bilateral primary osteoarthritis of knee: Secondary | ICD-10-CM | POA: Diagnosis not present

## 2022-05-10 DIAGNOSIS — E782 Mixed hyperlipidemia: Secondary | ICD-10-CM | POA: Diagnosis not present

## 2022-05-10 DIAGNOSIS — E1169 Type 2 diabetes mellitus with other specified complication: Secondary | ICD-10-CM | POA: Diagnosis present

## 2022-05-10 DIAGNOSIS — I2510 Atherosclerotic heart disease of native coronary artery without angina pectoris: Secondary | ICD-10-CM | POA: Diagnosis not present

## 2022-05-10 DIAGNOSIS — D649 Anemia, unspecified: Secondary | ICD-10-CM | POA: Diagnosis not present

## 2022-05-10 DIAGNOSIS — R471 Dysarthria and anarthria: Secondary | ICD-10-CM | POA: Diagnosis present

## 2022-05-10 DIAGNOSIS — E1122 Type 2 diabetes mellitus with diabetic chronic kidney disease: Secondary | ICD-10-CM | POA: Diagnosis not present

## 2022-05-10 DIAGNOSIS — N400 Enlarged prostate without lower urinary tract symptoms: Secondary | ICD-10-CM | POA: Diagnosis not present

## 2022-05-10 DIAGNOSIS — R040 Epistaxis: Secondary | ICD-10-CM | POA: Diagnosis not present

## 2022-05-10 DIAGNOSIS — I6501 Occlusion and stenosis of right vertebral artery: Secondary | ICD-10-CM | POA: Diagnosis present

## 2022-05-10 DIAGNOSIS — I152 Hypertension secondary to endocrine disorders: Secondary | ICD-10-CM | POA: Diagnosis present

## 2022-05-10 DIAGNOSIS — R29705 NIHSS score 5: Secondary | ICD-10-CM | POA: Diagnosis present

## 2022-05-10 DIAGNOSIS — Z9181 History of falling: Secondary | ICD-10-CM

## 2022-05-10 DIAGNOSIS — E785 Hyperlipidemia, unspecified: Secondary | ICD-10-CM | POA: Diagnosis present

## 2022-05-10 DIAGNOSIS — Z881 Allergy status to other antibiotic agents status: Secondary | ICD-10-CM

## 2022-05-10 DIAGNOSIS — R531 Weakness: Secondary | ICD-10-CM | POA: Diagnosis not present

## 2022-05-10 DIAGNOSIS — E1165 Type 2 diabetes mellitus with hyperglycemia: Secondary | ICD-10-CM | POA: Diagnosis not present

## 2022-05-10 DIAGNOSIS — R29810 Facial weakness: Secondary | ICD-10-CM | POA: Diagnosis present

## 2022-05-10 DIAGNOSIS — R270 Ataxia, unspecified: Secondary | ICD-10-CM | POA: Diagnosis not present

## 2022-05-10 DIAGNOSIS — E1159 Type 2 diabetes mellitus with other circulatory complications: Secondary | ICD-10-CM | POA: Diagnosis present

## 2022-05-10 DIAGNOSIS — I6529 Occlusion and stenosis of unspecified carotid artery: Secondary | ICD-10-CM

## 2022-05-10 DIAGNOSIS — R1310 Dysphagia, unspecified: Secondary | ICD-10-CM

## 2022-05-10 DIAGNOSIS — Z794 Long term (current) use of insulin: Secondary | ICD-10-CM | POA: Diagnosis not present

## 2022-05-10 DIAGNOSIS — I6523 Occlusion and stenosis of bilateral carotid arteries: Secondary | ICD-10-CM | POA: Diagnosis not present

## 2022-05-10 DIAGNOSIS — Z7984 Long term (current) use of oral hypoglycemic drugs: Secondary | ICD-10-CM

## 2022-05-10 DIAGNOSIS — Z972 Presence of dental prosthetic device (complete) (partial): Secondary | ICD-10-CM

## 2022-05-10 LAB — CBC WITH DIFFERENTIAL/PLATELET
Abs Immature Granulocytes: 0.01 10*3/uL (ref 0.00–0.07)
Basophils Absolute: 0 10*3/uL (ref 0.0–0.1)
Basophils Relative: 0 %
Eosinophils Absolute: 0.1 10*3/uL (ref 0.0–0.5)
Eosinophils Relative: 1 %
HCT: 35.2 % — ABNORMAL LOW (ref 39.0–52.0)
Hemoglobin: 11.8 g/dL — ABNORMAL LOW (ref 13.0–17.0)
Immature Granulocytes: 0 %
Lymphocytes Relative: 15 %
Lymphs Abs: 0.9 10*3/uL (ref 0.7–4.0)
MCH: 31.3 pg (ref 26.0–34.0)
MCHC: 33.5 g/dL (ref 30.0–36.0)
MCV: 93.4 fL (ref 80.0–100.0)
Monocytes Absolute: 0.4 10*3/uL (ref 0.1–1.0)
Monocytes Relative: 7 %
Neutro Abs: 4.7 10*3/uL (ref 1.7–7.7)
Neutrophils Relative %: 77 %
Platelets: 239 10*3/uL (ref 150–400)
RBC: 3.77 MIL/uL — ABNORMAL LOW (ref 4.22–5.81)
RDW: 14.4 % (ref 11.5–15.5)
WBC: 6.1 10*3/uL (ref 4.0–10.5)
nRBC: 0 % (ref 0.0–0.2)

## 2022-05-10 LAB — COMPREHENSIVE METABOLIC PANEL
ALT: 30 U/L (ref 0–44)
AST: 24 U/L (ref 15–41)
Albumin: 3.9 g/dL (ref 3.5–5.0)
Alkaline Phosphatase: 60 U/L (ref 38–126)
Anion gap: 10 (ref 5–15)
BUN: 28 mg/dL — ABNORMAL HIGH (ref 8–23)
CO2: 21 mmol/L — ABNORMAL LOW (ref 22–32)
Calcium: 9.5 mg/dL (ref 8.9–10.3)
Chloride: 109 mmol/L (ref 98–111)
Creatinine, Ser: 1.47 mg/dL — ABNORMAL HIGH (ref 0.61–1.24)
GFR, Estimated: 48 mL/min — ABNORMAL LOW (ref >60)
Glucose, Bld: 119 mg/dL — ABNORMAL HIGH (ref 70–99)
Potassium: 3.6 mmol/L (ref 3.5–5.1)
Sodium: 140 mmol/L (ref 135–145)
Total Bilirubin: 0.7 mg/dL (ref 0.3–1.2)
Total Protein: 6.9 g/dL (ref 6.5–8.1)

## 2022-05-10 LAB — GLUCOSE, CAPILLARY: Glucose-Capillary: 77 mg/dL (ref 70–99)

## 2022-05-10 LAB — PROTIME-INR
INR: 1 (ref 0.8–1.2)
Prothrombin Time: 13.3 seconds (ref 11.4–15.2)

## 2022-05-10 LAB — MAGNESIUM: Magnesium: 2.2 mg/dL (ref 1.7–2.4)

## 2022-05-10 LAB — CBG MONITORING, ED
Glucose-Capillary: 82 mg/dL (ref 70–99)
Glucose-Capillary: 89 mg/dL (ref 70–99)

## 2022-05-10 MED ORDER — SENNOSIDES-DOCUSATE SODIUM 8.6-50 MG PO TABS: 1.0000 | ORAL_TABLET | Freq: Every evening | ORAL | Status: DC | PRN

## 2022-05-10 MED ORDER — SODIUM CHLORIDE 0.9 % IV SOLN: INTRAVENOUS | Status: DC

## 2022-05-10 MED ORDER — EZETIMIBE 10 MG PO TABS
10.0000 mg | ORAL_TABLET | Freq: Every day | ORAL | Status: DC
  Administered 2022-05-11: 10 mg via ORAL
  Filled 2022-05-10: qty 1

## 2022-05-10 MED ORDER — CLOPIDOGREL BISULFATE 75 MG PO TABS
75.0000 mg | ORAL_TABLET | Freq: Every day | ORAL | Status: DC
  Administered 2022-05-11: 75 mg via ORAL
  Filled 2022-05-10: qty 1

## 2022-05-10 MED ORDER — ASPIRIN 325 MG PO TABS
325.0000 mg | ORAL_TABLET | Freq: Every day | ORAL | Status: DC
  Administered 2022-05-11: 325 mg via ORAL
  Filled 2022-05-10: qty 1

## 2022-05-10 MED ORDER — LACTATED RINGERS IV BOLUS
1000.0000 mL | Freq: Once | INTRAVENOUS | Status: AC
  Administered 2022-05-10: 1000 mL via INTRAVENOUS

## 2022-05-10 MED ORDER — PANTOPRAZOLE SODIUM 40 MG PO TBEC
40.0000 mg | DELAYED_RELEASE_TABLET | Freq: Every day | ORAL | Status: DC
  Administered 2022-05-11 – 2022-05-15 (×4): 40 mg via ORAL
  Filled 2022-05-10 (×6): qty 1

## 2022-05-10 MED ORDER — ACETAMINOPHEN 650 MG RE SUPP: 650.0000 mg | RECTAL | Status: DC | PRN

## 2022-05-10 MED ORDER — INSULIN ASPART 100 UNIT/ML IJ SOLN
0.0000 [IU] | INTRAMUSCULAR | Status: DC
  Administered 2022-05-11 – 2022-05-12 (×2): 3 [IU] via SUBCUTANEOUS
  Administered 2022-05-13: 2 [IU] via SUBCUTANEOUS

## 2022-05-10 MED ORDER — ACETAMINOPHEN 325 MG PO TABS: 650.0000 mg | ORAL_TABLET | ORAL | Status: DC | PRN

## 2022-05-10 MED ORDER — STROKE: EARLY STAGES OF RECOVERY BOOK
Freq: Once | Status: AC
  Filled 2022-05-10 (×2): qty 1

## 2022-05-10 MED ORDER — IOHEXOL 350 MG/ML SOLN
75.0000 mL | Freq: Once | INTRAVENOUS | Status: AC | PRN
  Administered 2022-05-10: 75 mL via INTRAVENOUS

## 2022-05-10 MED ORDER — POTASSIUM CHLORIDE 10 MEQ/100ML IV SOLN
10.0000 meq | INTRAVENOUS | Status: AC
  Administered 2022-05-10 (×4): 10 meq via INTRAVENOUS
  Filled 2022-05-10 (×4): qty 100

## 2022-05-10 MED ORDER — ROSUVASTATIN CALCIUM 5 MG PO TABS
5.0000 mg | ORAL_TABLET | Freq: Every day | ORAL | Status: DC
  Administered 2022-05-11 – 2022-05-15 (×5): 5 mg via ORAL
  Filled 2022-05-10 (×5): qty 1

## 2022-05-10 MED ORDER — ENOXAPARIN SODIUM 40 MG/0.4ML IJ SOSY
40.0000 mg | PREFILLED_SYRINGE | INTRAMUSCULAR | Status: DC
  Administered 2022-05-10 – 2022-05-13 (×4): 40 mg via SUBCUTANEOUS
  Filled 2022-05-10 (×4): qty 0.4

## 2022-05-10 MED ORDER — ACETAMINOPHEN 160 MG/5ML PO SOLN: 650.0000 mg | ORAL | Status: DC | PRN

## 2022-05-10 NOTE — ED Provider Notes (Signed)
Care of patient assumed from Dr. Stark Jock.  This patient had a recent hospital admission for CVA.  He was discharged on Thursday.  He presents to the ED this morning for new neurologic symptoms since yesterday.  Neurology has been consulted. Physical Exam  BP (!) 116/94   Pulse 69   Temp 97.9 F (36.6 C) (Oral)   Resp 19   Ht '5\' 6"'$  (1.676 m)   Wt 81 kg   SpO2 100%   BMI 28.82 kg/m   Physical Exam Vitals and nursing note reviewed.  Constitutional:      General: He is not in acute distress.    Appearance: He is well-developed. He is not ill-appearing, toxic-appearing or diaphoretic.  HENT:     Head: Normocephalic and atraumatic.     Right Ear: External ear normal.     Left Ear: External ear normal.     Nose: Nose normal.     Mouth/Throat:     Mouth: Mucous membranes are moist.  Eyes:     Extraocular Movements: Extraocular movements intact.     Conjunctiva/sclera: Conjunctivae normal.  Cardiovascular:     Rate and Rhythm: Normal rate and regular rhythm.  Pulmonary:     Effort: Pulmonary effort is normal. No respiratory distress.  Abdominal:     General: There is no distension.     Palpations: Abdomen is soft.  Musculoskeletal:        General: No swelling or deformity.     Cervical back: Normal range of motion and neck supple.  Skin:    General: Skin is warm and dry.     Coloration: Skin is not jaundiced or pale.  Neurological:     Mental Status: He is alert.     Comments: Mild left-sided facial droop, slurred speech, mild left-sided weakness.  Psychiatric:        Mood and Affect: Mood normal.        Behavior: Behavior normal.     Procedures  Procedures  ED Course / MDM    Medical Decision Making Amount and/or Complexity of Data Reviewed Labs: ordered. Radiology: ordered.  Risk Prescription drug management.   Patient underwent evaluation by neurology.  Recommendations are for permissive HTN, continuation of aspirin and Plavix, MRI brain, evaluation of head and  neck vessels, and admission here at [facility].  On assessment, patient is resting comfortably.  He does appear to have mild left-sided neurologic deficits.  Patient was admitted to hospitalist for further management.       Godfrey Pick, MD 05/10/22 [phone]

## 2022-05-10 NOTE — ED Notes (Signed)
ED Provider at bedside. 

## 2022-05-10 NOTE — Consult Note (Addendum)
[facility] TeleNeurology Consult Services  Stat Consult  Patient Name:   Patrick Moss, Patrick Moss Date of Birth:   1940/11/30 Identification Number:   MRN - [id number] Date of Service:   05/10/2022 06:34:24  Diagnosis:       I63.9 - Cerebrovascular accident (CVA), unspecified mechanism ([place])  Impression 81 yo male with history of HTN, HLD, DM, recent Right corona radiata stroke 3 days ago with residual Left facial droop and dysarthria presenting now with new onset LUE weakness and dysphagia since 1800 yesterday. NIHSS 5. CT Head negative for acute findings. LUE weakness can be explained by recent Right corona radiata infarcts, but dysphagia localizing to the brainstem. Would obtain MRI Brain wo to r/o new stroke and CTA head/neck to assess posterior circulation. Continue ASA 325 mg and Plavix 75 mg for now. May need TEE if concern for strokes in multiple vascular categories. Permissive HTN. PT/OT/SLP evaluation.   Recommendations: Our recommendations are outlined below.  Diagnostic Studies : MRI head without contrast, okay to be completed on Monday CTA Head and Neck Loop Recorder as outpatient with cardiology follow up  Antithrombotic Medication : Permissive hypertension, Antihypertensives with prn for first 24-48 hrs post stroke onset. If BP greater than 220/120 give Labetalol IV or Vasotec IV Statins for LDL goal less than 70 Continue ASA 325 mg and Plavix 75 mg daily  Nursing Recommendations : IV Fluids, avoid dextrose containing fluids, Maintain euglycemia Neuro checks q4 hrs x 24 hrs and then per shift Head of bed 30 degrees Continue with Telemetry  Consultations : Recommend Speech therapy if failed dysphagia screen Physical therapy/Occupational therapy  DVT Prophylaxis : Choice of Primary Team  Disposition : Neurology will  follow   ----------------------------------------------------------------------------------------------------    Metrics: [facility] Notification Time: 05/10/2022 06:32:49 Stamp Time: 05/10/2022 06:34:24 Callback Response Time: 05/10/2022 06:35:32  Primary Provider Notified of Diagnostic Impression and Management Plan on: 05/10/2022 07:10:13   CT HEAD: As Per Radiologist CT Head Showed No Acute Hemorrhage or Acute Core Infarct    ----------------------------------------------------------------------------------------------------  Chief Complaint: LUE weakness, difficulty swallowing  History of Present Illness: Patient is a 81 year old Male. Presenting with Left arm weakness and difficulty swallowing since about 1800 yesterday. Patient was seen for Left facial droop and slurred speech 3 days ago. Had MRI brain at that time showed 2 small infarcts in corona radiata on the Right. The dysphagia and arm weakness is new since yesterday.    Past Medical History:      Hypertension      Diabetes Mellitus      Hyperlipidemia      Stroke  Medications:  No Anticoagulant use  Antiplatelet use: Yes ASA 325 mg/Plavix Reviewed EMR for current medications  Allergies:  Reviewed  Social History: Drug Use: No  Family History:  There is no family history of premature cerebrovascular disease pertinent to this consultation  ROS : 14 Points Review of Systems was performed and was negative except mentioned in HPI.  Past Surgical History: There Is No Surgical History Contributory To Today's Visit    Examination: BP(103/54), Pulse(85), 1A: Level of Consciousness - Alert; keenly responsive + 0 1B: Ask Month and Age - Both Questions Right + 0 1C: Blink Eyes & Squeeze Hands - Performs Both Tasks + 0 2: Test Horizontal Extraocular Movements - Normal + 0 3: Test Visual Fields - No Visual Loss + 0 4: Test Facial Palsy (Use Grimace if Obtunded) - Partial paralysis (lower face) +  2 5A: Test Left Arm Motor  Drift - Drift, but doesn't hit bed + 1 5B: Test Right Arm Motor Drift - No Drift for 10 Seconds + 0 6A: Test Left Leg Motor Drift - No Drift for 5 Seconds + 0 6B: Test Right Leg Motor Drift - No Drift for 5 Seconds + 0 7: Test Limb Ataxia (FNF/Heel-Shin) - Ataxia in 1 Limb + 1 8: Test Sensation - Normal; No sensory loss + 0 9: Test Language/Aphasia - Normal; No aphasia + 0 10: Test Dysarthria - Mild-Moderate Dysarthria: Slurring but can be understood + 1 11: Test Extinction/Inattention - No abnormality + 0  NIHSS Score: 5  Spoke with : Dr. Scot Dock    Patient / Family was informed the Neurology Consult would occur via TeleHealth consult by way of interactive audio and video telecommunications and consented to receiving care in this manner.  Patient is being evaluated for possible acute neurologic impairment and high probability of imminent or life - threatening deterioration.I spent total of 30 minutes providing care to this patient, including time for face to face visit via telemedicine, review of medical records, imaging studies and discussion of findings with providers, the patient and / or family.   Dr Janeece Agee   [facility] For Inpatient follow-up with [facility] physician please call [facility] [phone]. This is not an outpatient service. Post hospital discharge, please contact hospital directly.

## 2022-05-10 NOTE — H&P (Signed)
History and Physical    Patrick Moss YJE:[id number] DOB: Oct 14, 1940 DOA: 05/10/2022  PCP: [name], MD  Patient coming from: Home  I have personally briefly reviewed patient's old medical records in [place]  Chief Complaint: Left hand weakness  HPI: Patrick Moss is a 81 y.o. male with medical history significant of hypertension, diabetes, chronic kidney disease stage IIIa, was recently discharged from the hospital on 11/23 after being evaluated for acute/subacute stroke of right corona radiata.  At that time, he had presented with right facial droop and slurred speech.  He was noted to have carotid stenosis on carotid ultrasound.  Seen by neurology with recommendations for outpatient follow-up with interventional radiology to be considered for stenting.  He was started on aspirin and Plavix.  He reports that during his hospital stay, he did have some mild sensation of dysphagia, although it appears he had passed his swallow screen and was not seen by speech therapy.  The patient was ambulating with his cane which she has chronically done due to right lower extremity arthritis and pain.  After returning home, he reported worsening of his dysphagia/dysarthria.  He noted that he had trouble maintaining his oral secretions causing him to drool out of the left side of his mouth.  Yesterday, he began noticing weakness in his left hand.  He was having trouble holding his cane and was dropping things from his left hand.  Due to development of new symptoms, he came to the ER for evaluation.  ED Course: Patient was seen by tele-neurology.  Noncontrast CT head showed focal area of low-attenuation within the right corona radiata corresponding to areas of restricted diffusion on recent MRI and compatible with evolutionary changes of previously noted acute to subacute infarcts.  Recommendations were to continue antiplatelet therapy.  Obtain CTA of head and neck to evaluate posterior circulation  and repeat MRI brain to evaluate for new infarcts.  Review of Systems: As per HPI otherwise 10 point review of systems negative.    Past Medical History:  Diagnosis Date   Borderline glaucoma of both eyes    BPH associated with nocturia    CKD (chronic kidney disease), stage II    Complication of anesthesia    per pt emergent hiccups   Hyperlipidemia, mixed    Hypertension    Malignant neoplasm prostate ([place]) 11/2021   primary urologist---  dr [place]/  radiation oncology [facility];  dx 06/ 2023,  Gleason 4+3,  PSA  13.2   OA (osteoarthritis)    knees   Type 2 diabetes mellitus treated with insulin ([place])    followed by pcp ( previous seen by endocriologist--- dr g. nida ,lov note in epic 02-19-2021);    (02-11-2022  pt stated checks blood sugar twice daily,  fasting sugar average 90s  to 120s)   Wears dentures    upper   Wears glasses     Past Surgical History:  Procedure Laterality Date   FEMUR IM NAIL Right 05/03/2021   '@[facility]'$ --  [place]   FOOT FRACTURE SURGERY Right    2017;   ORIF , per pt plate under foot   GOLD SEED IMPLANT N/A 02/18/2022   Procedure: GOLD SEED IMPLANT;  Surgeon: Remi Haggard, MD;  Location: [facility];  Service: Urology;  Laterality: N/A;  [place] Bilateral 2007   approx   SPACE OAR INSTILLATION N/A 02/18/2022   Procedure: SPACE OAR INSTILLATION;  Surgeon: Remi Haggard, MD;  Location[facility];  Service: Urology;  Laterality: N/A;    Social History:  reports that he has never smoked. He has never used smokeless tobacco. He reports that he does not drink alcohol and does not use drugs.  Allergies  Allergen Reactions   Septra [Sulfamethoxazole-Trimethoprim] Hives    Family History  Problem Relation Age of Onset   Diabetes Mother    Diabetes Father    Diabetes Brother      Prior to Admission medications   Medication Sig Start Date End Date Taking?  Authorizing Provider  amLODipine (NORVASC) 5 MG tablet Take 1 tablet (5 mg total) by mouth daily. 04/07/22   [name], MD  aspirin 325 MG tablet Take 1 tablet (325 mg total) by mouth daily. 05/09/22   Barton Dubois, MD  B-D ULTRAFINE III SHORT PEN 31G X 8 MM MISC USE  1  PEN  NEEDLE AS DIRECTED 01/14/17   Nida, Marella Chimes, MD  calcium carbonate (OSCAL) 1500 (600 Ca) MG TABS tablet Take 1,500 mg by mouth 2 (two) times daily with a meal.    [provider]  celecoxib (CELEBREX) 400 MG capsule Take 1 capsule (400 mg total) by mouth daily as needed for pain (severe pain). With food 05/08/22   Barton Dubois, MD  Cholecalciferol (VITAMIN D3) 50 MCG (2000 UT) TABS Take 1 capsule by mouth daily.    [provider]  clopidogrel (PLAVIX) 75 MG tablet Take 1 tablet (75 mg total) by mouth daily. 05/09/22   Barton Dubois, MD  diclofenac Sodium (VOLTAREN) 1 % GEL Apply 4 g topically 4 (four) times daily as needed. For the knee 05/08/22   Barton Dubois, MD  Dulaglutide (TRULICITY) 1.5 OB/0.9GG SOPN Inject 1.5 mg into the skin once a week. Patient taking differently: Inject 1.5 mg into the skin once a week. Saturday's 05/15/21   [name], MD  ezetimibe (ZETIA) 10 MG tablet Take 1 tablet (10 mg total) by mouth daily. 05/08/22 05/08/23  Barton Dubois, MD  glucose blood (TRUE METRIX BLOOD GLUCOSE TEST) test strip TEST BLOOD SUGAR TWO TO THREE TIMES DAILY Dx E11.65 07/03/21   [name], MD  Insulin Glargine (BASAGLAR KWIKPEN) 100 UNIT/ML Inject 30 Units into the skin daily. Patient taking differently: Inject 30 Units into the skin at bedtime. 05/15/21   [name], MD  metFORMIN (GLUCOPHAGE-XR) 500 MG 24 hr tablet TAKE 2 TABLETS EVERY DAY AFTER BREAKFAST Patient taking differently: 1,000 mg daily with breakfast. 01/13/22   [name], MD  omega-3 fish oil (MAXEPA) 1000 MG CAPS capsule Take 1 capsule by mouth daily. 05/06/21   [provider]  pantoprazole  (PROTONIX) 40 MG tablet Take 1 tablet (40 mg total) by mouth daily. 05/08/22 05/08/23  Barton Dubois, MD  rosuvastatin (CRESTOR) 5 MG tablet Take 1 tablet (5 mg total) by mouth daily. 04/07/22   [name], MD  triamcinolone cream (KENALOG) 0.1 % Apply 1 application. topically 3 (three) times daily. For the ankle area 08/27/21   [name], MD  valsartan-hydrochlorothiazide (DIOVAN-HCT) 320-25 MG tablet Take 1 tablet by mouth daily. 04/07/22   [name], MD    Physical Exam: Vitals:   05/10/22 0830 05/10/22 0835 05/10/22 0900 05/10/22 1000  BP: 128/61 (!) 116/94 (!) 117/56 (!) 147/73  Pulse: 65 69 69   Resp:  '16 18 20  '$ Temp:      TempSrc:      SpO2: 95% 100% 99%  Weight:      Height:        Constitutional: NAD, calm, comfortable Eyes: PERRL, lids and conjunctivae normal ENMT: Mucous membranes are moist. Posterior pharynx clear of any exudate or lesions.Normal dentition.  Neck: normal, supple, no masses, no thyromegaly Respiratory: clear to auscultation bilaterally, no wheezing, no crackles. Normal respiratory effort. No accessory muscle use.  Cardiovascular: Regular rate and rhythm, no murmurs / rubs / gallops. No extremity edema. 2+ pedal pulses. No carotid bruits.  Abdomen: no tenderness, no masses palpated. No hepatosplenomegaly. Bowel sounds positive.  Musculoskeletal: no clubbing / cyanosis. No joint deformity upper and lower extremities. Good ROM, no contractures. Normal muscle tone.  Skin: no rashes, lesions, ulcers. No induration Neurologic: Noted to have continued right-sided facial droop.  He does have left-sided pronator drift.  Decreased left hand grip.  Strength is 5 out of 5 in lower extremities bilaterally.  Sensations are intact.  Noted to be consistently drooling out of the left side of his mouth Psychiatric: Normal judgment and insight. Alert and oriented x 3. Normal mood.    Labs on Admission: I have personally reviewed following labs and imaging  studies  CBC: Recent Labs  Lab 05/07/22 1258 05/10/22 0505  WBC 6.8 6.1  NEUTROABS 5.3 4.7  HGB 12.6* 11.8*  HCT 37.2* 35.2*  MCV 93.2 93.4  PLT 261 970   Basic Metabolic Panel: Recent Labs  Lab 05/07/22 1258 05/08/22 0447 05/10/22 0505  NA 139 141 140  K 3.8 4.0 3.6  CL 105 111 109  CO2 25 25 21*  GLUCOSE 132* 91 119*  BUN 24* 22 28*  CREATININE 1.35* 1.38* 1.47*  CALCIUM 9.8 9.3 9.5   GFR: Estimated Creatinine Clearance: 40.1 mL/min (A) (by C-G formula based on SCr of 1.47 mg/dL (H)). Liver Function Tests: Recent Labs  Lab 05/07/22 1258 05/10/22 0505  AST 24 24  ALT 32 30  ALKPHOS 73 60  BILITOT 0.8 0.7  PROT 7.6 6.9  ALBUMIN 4.1 3.9   No results for input(s): "LIPASE", "AMYLASE" in the last 168 hours. No results for input(s): "AMMONIA" in the last 168 hours. Coagulation Profile: Recent Labs  Lab 05/10/22 0505  INR 1.0   Cardiac Enzymes: No results for input(s): "CKTOTAL", "CKMB", "CKMBINDEX", "TROPONINI" in the last 168 hours. BNP (last 3 results) No results for input(s): "PROBNP" in the last 8760 hours. HbA1C: Recent Labs    05/07/22 1258  HGBA1C 6.1*   CBG: Recent Labs  Lab 05/07/22 1243 05/07/22 1822 05/07/22 2104 05/08/22 0749 05/08/22 1136  GLUCAP 138* 93 127* 93 116*   Lipid Profile: Recent Labs    05/08/22 0447  CHOL 92  HDL 39*  LDLCALC 24  TRIG 144  CHOLHDL 2.4   Thyroid Function Tests: Recent Labs    05/07/22 1258  TSH 4.394   Anemia Panel: No results for input(s): "VITAMINB12", "FOLATE", "FERRITIN", "TIBC", "IRON", "RETICCTPCT" in the last 72 hours. Urine analysis:    Component Value Date/Time   COLORURINE AMBER (A) 05/07/2022 1304   APPEARANCEUR CLEAR 05/07/2022 1304   APPEARANCEUR Clear 12/31/2021 1416   LABSPEC 1.018 05/07/2022 1304   PHURINE 6.0 05/07/2022 1304   GLUCOSEU NEGATIVE 05/07/2022 1304   HGBUR NEGATIVE 05/07/2022 1304   BILIRUBINUR NEGATIVE 05/07/2022 1304   BILIRUBINUR Negative 12/31/2021  1416   KETONESUR NEGATIVE 05/07/2022 1304   PROTEINUR 30 (A) 05/07/2022 1304   NITRITE POSITIVE (A) 05/07/2022 1304   LEUKOCYTESUR NEGATIVE 05/07/2022 1304    Radiological Exams on Admission:  CT ANGIO HEAD NECK W WO CM  Result Date: 05/10/2022 CLINICAL DATA:  81 year old male with weakness on the left side, difficulty swallowing. Recent small right corona radiata infarcts on MRI 05/07/2022. prostate cancer. EXAM: CT ANGIOGRAPHY HEAD AND NECK TECHNIQUE: Multidetector CT imaging of the head and neck was performed using the standard protocol during bolus administration of intravenous contrast. Multiplanar CT image reconstructions and MIPs were obtained to evaluate the vascular anatomy. Carotid stenosis measurements (when applicable) are obtained utilizing NASCET criteria, using the distal internal carotid diameter as the denominator. RADIATION DOSE REDUCTION: This exam was performed according to the departmental dose-optimization program which includes automated exposure control, adjustment of the mA and/or kV according to patient size and/or use of iterative reconstruction technique. CONTRAST:  19m OMNIPAQUE IOHEXOL 350 MG/ML SOLN COMPARISON:  CT head this morning reported separately. Brain MRI and intracranial MRA 05/07/2022. FINDINGS: CTA NECK Skeleton: Advanced cervical spine degeneration. Advanced cervicothoracic junction facet degeneration. But no acute or suspicious osseous lesion identified. Upper chest: Visible central pulmonary arteries appear patent. Visible lungs are largely clear, minor atelectasis. No superior mediastinal lymphadenopathy. Other neck: Negative. Aortic arch: 3 vessel arch configuration.  No arch atherosclerosis. Right carotid system: Minimal right CCA plaque. Right ICA origin and bulb combined soft and calcified plaque with evidence of hemodynamically significant proximal right ICA stenosis on series 4, image 113, numerically estimated at 60 % with respect to the distal vessel.  A small amount of adherent thrombus is possible as seen on that image. No additional plaque or stenosis to the skull base. Left carotid system: Minimal plaque and no stenosis. Vertebral arteries: Proximal right subclavian artery is normal. Calcified plaque at the right vertebral artery origin, but only mild origin stenosis. Mild additional V1 segment plaque and stenosis. Right vertebral artery appears somewhat dominant and is otherwise negative to the skull base. Proximal left subclavian artery is normal. Left vertebral artery origin is normal. There is mild left V1 plaque and tortuosity, mild associated stenosis. Mildly non dominant left vertebral is otherwise negative to the skull base. CTA HEAD Posterior circulation: Dominant right V4 segment. Bulky bilateral V4 segment calcified plaque on series 5, image 286. Mild stenosis on the left. But moderate to severe stenosis in the dominant right V4 segment on series 5, image 279. Both PICA origins occurred early and are patent, proximal to this plaque. Vertebrobasilar junction remains patent without stenosis. Patent basilar artery, basilar tip, SCA and PCA origins. Posterior communicating arteries are diminutive or absent. Bilateral PCA branches are within normal limits. Anterior circulation: Both ICA siphons are patent. Moderate left ICA petrous soft plaque (series 10, image 123) with mild associated stenosis. Mild to moderate additional left ICA cavernous segment plaque and mild to moderate distal cavernous stenosis on series 5, image 21. Patent supraclinoid left ICA without stenosis. Normal left ophthalmic artery origin. Right ICA siphon mild calcified plaque without stenosis. Patent carotid termini. Patent MCA and ACA origins. Normal anterior communicating artery. Normal bilateral ACA branches except for tortuosity. Left MCA M1 segment and bifurcation are patent without stenosis. Left MCA branches are within normal limits. Right MCA M1 segment and trifurcation are  patent without stenosis. Right MCA branches are within normal limits. Venous sinuses: Patent. Anatomic variants: Dominant right vertebral artery. Review of the MIP images confirms the above findings IMPRESSION: 1. Negative for large vessel occlusion. But Positive for complex plaque in the proximal Right ICA with a possible small focus of superimposed adherent thrombus (series 8, image 141 and series 4,  image 113). Hemodynamically significant stenosis there estimated at 60%. And positive also for bulky bilateral calcified plaque of the Dominant Right Vertebral V4 segment with up to Severe stenosis. This is distal to the Right PICA, and the Basilar artery is patent without stenosis. 2. Additionally, mild to moderate Left ICA siphon petrous and cavernous segment atherosclerotic stenosis. Salient findings discussed by telephone with Dr. Godfrey Pick on 05/10/2022 at 08:25 . Electronically Signed   By: Genevie Ann M.D.   On: 05/10/2022 08:28   CT Head Wo Contrast  Result Date: 05/10/2022 CLINICAL DATA:  Neuro deficit. Difficulty swallowing and weakness on left side. EXAM: CT HEAD WITHOUT CONTRAST TECHNIQUE: Contiguous axial images were obtained from the base of the skull through the vertex without intravenous contrast. RADIATION DOSE REDUCTION: This exam was performed according to the departmental dose-optimization program which includes automated exposure control, adjustment of the mA and/or kV according to patient size and/or use of iterative reconstruction technique. COMPARISON:  MR 05/07/2022 FINDINGS: Brain: There is a focal area of low attenuation within the right corona radiata, image 18/3 corresponding to areas of restricted diffusion on recent MRI and compatible with evolutionary changes of previously noted acute to early subacute infarcts noted on 05/07/2022. No signs of acute intracranial hemorrhage, mass or mass effect. No significant hydrocephalus. There is mild diffuse low-attenuation within the subcortical  and periventricular white matter compatible with chronic microvascular disease. Prominence of sulci and ventricles compatible with brain atrophy. Vascular: No hyperdense vessel or unexpected calcification. Skull: Normal. Negative for fracture or focal lesion. Sinuses/Orbits: No acute finding. Other: None IMPRESSION: 1. Focal area of low attenuation within the right corona radiata corresponding to areas of restricted diffusion on recent MRI and compatible with evolutionary changes of previously noted acute to early subacute infarcts noted on 05/07/2022. 2. Chronic small vessel ischemic disease and brain atrophy. Electronically Signed   By: Kerby Moors M.D.   On: 05/10/2022 08:06    EKG: Independently reviewed. [email or web address]  Assessment/Plan Active Problems:   Mixed hyperlipidemia   Hypertension associated with diabetes ([place])   Type 2 diabetes with nephropathy ([place])   Uncontrolled type 2 diabetes mellitus with hyperglycemia ([facility])   Acute ischemic stroke ([facility])   GERD (gastroesophageal reflux disease)   Carotid stenosis, right   Dysphagia   Stage 3a chronic kidney disease (CKD) ([facility])     Recent acute/subacute stroke with worsening/new deficits Right carotid stenosis -He has undergone CTA head and neck that was negative for large vessel occlusion, but did show complex plaque in the proximal right ICA with a possible small focus of superimposed adherent thrombus.  Hemodynamically significant stenosis at 60%.  There was also noted to be bulky bilateral calcified plaque of the dominant right vertebral V4 segment with up to severe stenosis.  This was distal to the right PICA and the basilar artery is patent without stenosis -He is currently continued on aspirin and Plavix -Recent LDL, A1c was performed during his hospital stay -He is on statin and Zetia -Discussed with Dr. Quinn Axe, neurology regarding need for transfer to [facility] to expedite further workup including MRI brain as well as IR evaluation to see if  carotid stenting needs to be expedited, considering he is having worsening/recurrent symptoms. Neurology will see on transfer to [facility] -Echo was done during last admission -Will reconsult PT/OT/SLP since he appears to have worsening/new deficits -N.p.o. for now pending swallow evaluation  Hypertension -Allow permissive hypertension -Holding valsartan/hydrochlorothiazide as well as amlodipine for now  CKD stage IIIa -  Creatinine is currently near baseline -Continue to follow  Diabetes -Holding home medications including metformin, Trulicity, basal insulin -Started on sliding scale -Resume basal insulin once able to tolerate p.o. or blood sugars are trending up -Currently euglycemic  Dysphagia -SLP eval  GERD -Continue PPI  DVT prophylaxis: Lovenox Code Status: Full code Family Communication: Updated patient's brother at the bedside Disposition Plan: transfer to [facility] for further work up  [employer] called: Discussed with neurology Admission status: Observation, telemetry  Kathie Dike MD [facility]   If 7PM-7AM, please contact night-coverage [email or web address]   05/10/2022, 10:26 AM

## 2022-05-10 NOTE — ED Triage Notes (Signed)
Pt states he is having difficulty swallowing, getting weaker on his left side. Is unable to hold onto his cane with his left hand.   Pt was seen here on 11/22 for Stroke

## 2022-05-10 NOTE — ED Provider Notes (Signed)
[facility] EMERGENCY DEPARTMENT Provider Note   CSN: [id number] Arrival date & time: 05/10/22  0344     History  Chief Complaint  Patient presents with   Weakness    Patrick Moss is a 81 y.o. male.  Patient is an 81 year old male with past medical history of diabetes, hypertension, GERD, and recent admission for acute stroke.  He presented here approximately 5 days ago with slurred speech and right-sided facial droop.  CT scan was obtained showing 2 small acute to subacute infarcts within the right corona radiata.  Patient was admitted and found to have a 50 to 69% occluding plaque in the right carotid.  He was started on Plavix and discharged on the 23rd.  Starting yesterday at approximately lunchtime, he began to experience weakness and numbness in his left hand and difficulty grasping objects.  He also describes difficulty swallowing and holding onto his cane with his left hand.  He denies headache or visual disturbances.  The history is provided by the patient.       Home Medications Prior to Admission medications   Medication Sig Start Date End Date Taking? Authorizing Provider  amLODipine (NORVASC) 5 MG tablet Take 1 tablet (5 mg total) by mouth daily. 04/07/22   [name], MD  aspirin 325 MG tablet Take 1 tablet (325 mg total) by mouth daily. 05/09/22   Barton Dubois, MD  B-D ULTRAFINE III SHORT PEN 31G X 8 MM MISC USE  1  PEN  NEEDLE AS DIRECTED 01/14/17   Nida, Marella Chimes, MD  calcium carbonate (OSCAL) 1500 (600 Ca) MG TABS tablet Take 1,500 mg by mouth 2 (two) times daily with a meal.    [provider]  celecoxib (CELEBREX) 400 MG capsule Take 1 capsule (400 mg total) by mouth daily as needed for pain (severe pain). With food 05/08/22   Barton Dubois, MD  Cholecalciferol (VITAMIN D3) 50 MCG (2000 UT) TABS Take 1 capsule by mouth daily.    [provider]  clopidogrel (PLAVIX) 75 MG tablet Take 1 tablet (75 mg total) by mouth daily. 05/09/22    Barton Dubois, MD  diclofenac Sodium (VOLTAREN) 1 % GEL Apply 4 g topically 4 (four) times daily as needed. For the knee 05/08/22   Barton Dubois, MD  Dulaglutide (TRULICITY) 1.5 YJ/8.5UD SOPN Inject 1.5 mg into the skin once a week. Patient taking differently: Inject 1.5 mg into the skin once a week. Saturday's 05/15/21   [name], MD  ezetimibe (ZETIA) 10 MG tablet Take 1 tablet (10 mg total) by mouth daily. 05/08/22 05/08/23  Barton Dubois, MD  glucose blood (TRUE METRIX BLOOD GLUCOSE TEST) test strip TEST BLOOD SUGAR TWO TO THREE TIMES DAILY Dx E11.65 07/03/21   [name], MD  Insulin Glargine (BASAGLAR KWIKPEN) 100 UNIT/ML Inject 30 Units into the skin daily. Patient taking differently: Inject 30 Units into the skin at bedtime. 05/15/21   [name], MD  metFORMIN (GLUCOPHAGE-XR) 500 MG 24 hr tablet TAKE 2 TABLETS EVERY DAY AFTER BREAKFAST Patient taking differently: 1,000 mg daily with breakfast. 01/13/22   [name], MD  omega-3 fish oil (MAXEPA) 1000 MG CAPS capsule Take 1 capsule by mouth daily. 05/06/21   [provider]  pantoprazole (PROTONIX) 40 MG tablet Take 1 tablet (40 mg total) by mouth daily. 05/08/22 05/08/23  Barton Dubois, MD  rosuvastatin (CRESTOR) 5 MG tablet Take 1 tablet (5 mg total) by mouth daily. 04/07/22   [name], MD  triamcinolone cream (KENALOG)  0.1 % Apply 1 application. topically 3 (three) times daily. For the ankle area 08/27/21   [name], MD  valsartan-hydrochlorothiazide (DIOVAN-HCT) 320-25 MG tablet Take 1 tablet by mouth daily. 04/07/22   [name], MD      Allergies    Septra [sulfamethoxazole-trimethoprim]    Review of Systems   Review of Systems  All other systems reviewed and are negative.   Physical Exam Updated Vital Signs BP 121/69   Pulse 85   Temp 97.9 F (36.6 C) (Oral)   Resp 17   Ht '5\' 6"'$  (1.676 m)   Wt 81 kg   SpO2 95%   BMI 28.82 kg/m  Physical Exam Vitals and nursing note  reviewed.  Constitutional:      General: He is not in acute distress.    Appearance: He is well-developed. He is not diaphoretic.  HENT:     Head: Normocephalic and atraumatic.  Cardiovascular:     Rate and Rhythm: Normal rate and regular rhythm.     Heart sounds: No murmur heard.    No friction rub.  Pulmonary:     Effort: Pulmonary effort is normal. No respiratory distress.     Breath sounds: Normal breath sounds. No wheezing or rales.  Abdominal:     General: Bowel sounds are normal. There is no distension.     Palpations: Abdomen is soft.     Tenderness: There is no abdominal tenderness.  Musculoskeletal:        General: Normal range of motion.     Cervical back: Normal range of motion and neck supple.  Skin:    General: Skin is warm and dry.  Neurological:     Mental Status: He is alert and oriented to person, place, and time.     Cranial Nerves: Cranial nerve deficit present.     Motor: No weakness.     Coordination: Coordination normal.     Comments: A right-sided facial droop is noted.  He does seem to have decreased grip strength with the left hand.  Strength is 5 out of 5 in both lower extremities.     ED Results / Procedures / Treatments   Labs (all labs ordered are listed, but only abnormal results are displayed) Labs Reviewed  COMPREHENSIVE METABOLIC PANEL  CBC WITH DIFFERENTIAL/PLATELET  PROTIME-INR    EKG EKG Interpretation  Date/Time:  Saturday May 10 2022 07:01:41 EST Ventricular Rate:  104 PR Interval:    QRS Duration: 100 QT Interval:  393 QTC Calculation: 409 R Axis:   -47 Text Interpretation: Junctional tachycardia Ventricular bigeminy Left anterior fascicular block Borderline repolarization abnormality Confirmed by Veryl Speak [phone]) on 05/10/2022 7:05:02 AM  Radiology No results found.  Procedures Procedures    Medications Ordered in ED Medications - No data to display  ED Course/ Medical Decision Making/ A&P  Patient is  an 47-year-old male presenting with complaints of difficulty swallowing and left arm weakness.  He was just discharged 3 days ago after being diagnosed with a stroke.  Patient arrives here with stable vital signs.  On neurologic exam, he does have diminished strength with left hand grip and some slurred speech, but otherwise appears neurologically intact.  Workup initiated including CBC, metabolic panel, both of which were unremarkable.  CT scan of the head has been obtained with results pending at the time of this dictation.  I have also ordered a teleneurology consult which is currently being performed.  Care will be signed out to Dr. Doren Custard  at shift change.  He will obtain the results of the CT scan and consultation and determine the final disposition.  Final Clinical Impression(s) / ED Diagnoses Final diagnoses:  None    Rx / DC Orders ED Discharge Orders     None         Veryl Speak, MD 05/11/22 [phone]

## 2022-05-10 NOTE — Consult Note (Signed)
NEUROLOGY CONSULTATION NOTE   Date of service: May 10, 2022 Patient Name: Patrick Moss MRN:  [id number] DOB:  [birth year] Reason for consult: "R corona radiata stroke" Requesting Provider: Kathie Dike, MD _ _ _   _ __   _ __ _ _  __ __   _ __   __ _  History of Present Illness  Patrick Moss is a 81 y.o. male with PMH significant for BPH, CKD 3A, hyperlipidemia, hypertension, osteoarthritis, diabetes type 2 on insulin who presents with left hand weakness.  Reports that 3 days ago on 11/24, he noticed that his L hand was weak. He would have a difficult time holding his cane with his left hand and it would slip out of his hand. This was persistent and did not improve so he came to the ED.  Reprots that he was just recently diagnosed with a stroke on 11/23. He was started on aspirin and plavix and was setup to follow up with Dr. Alonza Smoker. In a couple weeks.  LKW: 05/09/22. mRS: 0 tNKASE: not offered, outside window Thrombectomy: not offered, outside window. NIHSS components Score: Comment  1a Level of Conscious 0'[x]'$  1'[]'$  2'[]'$  3'[]'$      1b LOC Questions 0'[x]'$  1'[]'$  2'[]'$       1c LOC Commands 0'[x]'$  1'[]'$  2'[]'$       2 Best Gaze 0'[x]'$  1'[]'$  2'[]'$       3 Visual 0'[x]'$  1'[]'$  2'[]'$  3'[]'$      4 Facial Palsy 0'[]'$  1'[]'$  2'[x]'$  3'[]'$      5a Motor Arm - left 0'[x]'$  1'[]'$  2'[]'$  3'[]'$  4'[]'$  UN'[]'$    5b Motor Arm - Right 0'[x]'$  1'[]'$  2'[]'$  3'[]'$  4'[]'$  UN'[]'$    6a Motor Leg - Left 0'[x]'$  1'[]'$  2'[]'$  3'[]'$  4'[]'$  UN'[]'$    6b Motor Leg - Right 0'[x]'$  1'[]'$  2'[]'$  3'[]'$  4'[]'$  UN'[]'$    7 Limb Ataxia 0'[x]'$  1'[]'$  2'[]'$  3'[]'$  UN'[]'$     8 Sensory 0'[x]'$  1'[]'$  2'[]'$  UN'[]'$      9 Best Language 0'[x]'$  1'[]'$  2'[]'$  3'[]'$      10 Dysarthria 0'[]'$  1'[]'$  2'[x]'$  UN'[]'$      11 Extinct. and Inattention 0'[x]'$  1'[]'$  2'[]'$       TOTAL: 4     ROS   Constitutional Denies weight loss, fever and chills.   HEENT Denies changes in vision and hearing.   Respiratory Denies SOB and cough.   CV Denies palpitations and CP   GI Denies abdominal pain, nausea, vomiting and diarrhea.   GU Denies dysuria and urinary frequency.    MSK Denies myalgia and joint pain.   Skin Denies rash and pruritus.   Neurological Denies headache and syncope.   Psychiatric Denies recent changes in mood. Denies anxiety and depression.    Past History   Past Medical History:  Diagnosis Date   Borderline glaucoma of both eyes    BPH associated with nocturia    CKD (chronic kidney disease), stage II    Complication of anesthesia    per pt emergent hiccups   Hyperlipidemia, mixed    Hypertension    Malignant neoplasm prostate ([place]) 11/2021   primary urologist---  dr dahl[place]/  radiation oncology [facility];  dx 06/ 2023,  Gleason 4+3,  PSA  13.2   OA (osteoarthritis)    knees   Type 2 diabetes mellitus treated with insulin ([place])    followed by pcp ( previous seen by endocriologist--- dr g. nida ,lov note in epic 02-19-2021);    (02-11-2022  pt stated checks blood sugar twice daily,  fasting sugar average 90s  to 120s)   Wears dentures    upper   Wears glasses    Past Surgical History:  Procedure Laterality Date   FEMUR IM NAIL Right 05/03/2021   '@[facility]'$ --  [place]   FOOT FRACTURE SURGERY Right    2017;   ORIF , per pt plate under foot   GOLD SEED IMPLANT N/A 02/18/2022   Procedure: GOLD SEED IMPLANT;  Surgeon: Remi Haggard, MD;  Location: [facility];  Service: Urology;  Laterality: N/A;  [place] Bilateral 2007   approx   SPACE OAR INSTILLATION N/A 02/18/2022   Procedure: SPACE OAR INSTILLATION;  Surgeon: Remi Haggard, MD;  Location: [facility];  Service: Urology;  Laterality: N/A;   Family History  Problem Relation Age of Onset   Diabetes Mother    Diabetes Father    Diabetes Brother    Social History   Socioeconomic History   Marital status: Divorced    Spouse name: Not on file   Number of children: 2   Years of education: 10   Highest education level: 10th grade  Occupational History   Occupation: retired  Tobacco Use   Smoking  status: Never   Smokeless tobacco: Never  Scientific laboratory technician Use: Never used  Substance and Sexual Activity   Alcohol use: No    Alcohol/week: 0.0 standard drinks of alcohol   Drug use: Never   Sexual activity: Not on file  Other Topics Concern   Not on file  Social History Narrative   Lives alone - son lives next door   [place] friend on his street - they check on each other several times per day and spend time together   Social Determinants of Health   Financial Resource Strain: Low Risk  (10/03/2021)   Overall Financial Resource Strain (CARDIA)    Difficulty of Paying Living Expenses: Not hard at all  Food Insecurity: No Food Insecurity (05/07/2022)   Hunger Vital Sign    Worried About Running Out of Food in the Last Year: Never true    [place] in the Last Year: Never true  Transportation Needs: No Transportation Needs (05/07/2022)   PRAPARE - Hydrologist (Medical): No    Lack of Transportation (Non-Medical): No  Physical Activity: Insufficiently Active (10/03/2021)   Exercise Vital Sign    Days of Exercise per Week: 7 days    Minutes of Exercise per Session: 20 min  Stress: No Stress Concern Present (10/03/2021)   [place]    Feeling of Stress : Not at all  Social Connections: Moderately Integrated (10/03/2021)   Social Connection and Isolation Panel [NHANES]    Frequency of Communication with Friends and Family: More than three times a week    Frequency of Social Gatherings with Friends and Family: More than three times a week    Attends Religious Services: More than 4 times per year    Active Member of [employer] or Organizations: Yes    Attends Music therapist: More than 4 times per year    Marital Status: Divorced   Allergies  Allergen Reactions   Septra [Sulfamethoxazole-Trimethoprim] Hives    Medications   Medications Prior to Admission  Medication  Sig Dispense Refill Last Dose   amLODipine (NORVASC) 5 MG tablet Take 1 tablet (5 mg total) by mouth daily. [place]  tablet 2 05/09/2022   aspirin 325 MG tablet Take 1 tablet (325 mg total) by mouth daily. 30 tablet 2 05/09/2022   calcium carbonate (OSCAL) 1500 (600 Ca) MG TABS tablet Take 1,500 mg by mouth 2 (two) times daily with a meal.   05/09/2022   celecoxib (CELEBREX) 400 MG capsule Take 1 capsule (400 mg total) by mouth daily as needed for pain (severe pain). With food   05/09/2022   Cholecalciferol (VITAMIN D3) 50 MCG (2000 UT) TABS Take 1 capsule by mouth daily.   05/09/2022   clopidogrel (PLAVIX) 75 MG tablet Take 1 tablet (75 mg total) by mouth daily. 30 tablet 2 05/09/2022   diclofenac Sodium (VOLTAREN) 1 % GEL Apply 4 g topically 4 (four) times daily as needed. For the knee   Past Week   Dulaglutide (TRULICITY) 1.5 TI/4.5YK SOPN Inject 1.5 mg into the skin once a week. (Patient taking differently: Inject 1.5 mg into the skin once a week. Saturday's) 6 mL 4 05/03/2022   ezetimibe (ZETIA) 10 MG tablet Take 1 tablet (10 mg total) by mouth daily. 30 tablet 11 05/09/2022   Insulin Glargine (BASAGLAR KWIKPEN) 100 UNIT/ML Inject 30 Units into the skin daily. (Patient taking differently: Inject 30 Units into the skin at bedtime.) 45 mL 4 05/09/2022   metFORMIN (GLUCOPHAGE-XR) 500 MG 24 hr tablet TAKE 2 TABLETS EVERY DAY AFTER BREAKFAST (Patient taking differently: 1,000 mg daily with breakfast.) 180 tablet 0 05/09/2022   omega-3 fish oil (MAXEPA) 1000 MG CAPS capsule Take 1 capsule by mouth daily.   05/09/2022   pantoprazole (PROTONIX) 40 MG tablet Take 1 tablet (40 mg total) by mouth daily. 30 tablet 1 05/09/2022   rosuvastatin (CRESTOR) 5 MG tablet Take 1 tablet (5 mg total) by mouth daily. 90 tablet 2 05/09/2022   valsartan-hydrochlorothiazide (DIOVAN-HCT) 320-25 MG tablet Take 1 tablet by mouth daily. 90 tablet 2 05/09/2022   B-D ULTRAFINE III SHORT PEN 31G X 8 MM MISC USE  1  PEN  NEEDLE AS  DIRECTED 270 each 3    glucose blood (TRUE METRIX BLOOD GLUCOSE TEST) test strip TEST BLOOD SUGAR TWO TO THREE TIMES DAILY Dx E11.65 300 each 3      Vitals   Vitals:   05/10/22 1630 05/10/22 1700 05/10/22 1730 05/10/22 1748  BP: 124/69 135/69 127/65   Pulse: 85 77 73   Resp: (!) '25 13 13   '$ Temp:    98.3 F (36.8 C)  TempSrc:      SpO2: 98% 97% 98%   Weight:      Height:         Body mass index is 28.82 kg/m.  Physical Exam   General: Laying comfortably in bed; in no acute distress.  HENT: Normal oropharynx and mucosa. Normal external appearance of ears and nose.  Neck: Supple, no pain or tenderness  CV: No JVD. No peripheral edema.  Pulmonary: Symmetric Chest rise. Normal respiratory effort. Abdomen: Soft to touch, non-tender.  Ext: No cyanosis, edema, or deformity  Skin: No rash. Normal palpation of skin.   Musculoskeletal: Normal digits and nails by inspection. No clubbing.   Neurologic Examination  Mental status/Cognition: Alert, oriented to self, place, month and year, good attention.  Speech/language: dysarthric speech, fluent, comprehension intact, object naming intact, repetition intact. Cranial nerves:   CN II Pupils equal and reactive to light, no VF deficits    CN III,IV,VI EOM intact, no gaze preference or deviation, no nystagmus    CN V  normal sensation in V1, V2, and V3 segments bilaterally    CN VII L facial droop   CN VIII normal hearing to speech    CN IX & X normal palatal elevation, no uvular deviation    CN XI 5/5 head turn and 5/5 shoulder shrug bilaterally    CN XII midline tongue protrusion    Motor:  Muscle bulk: normal, tone normal, pronator drift none tremor none Mvmt Root Nerve  Muscle Right Left Comments  SA C5/6 Ax Deltoid 5 5   EF C5/6 Mc Biceps 5 4+   EE C6/7/8 Rad Triceps 5 4+   WF C6/7 Med FCR     WE C7/8 PIN ECU     F Ab C8/T1 U ADM/FDI 5 4+   HF L1/2/3 Fem Illopsoas 5 5   KE L2/3/4 Fem Quad 5 5   DF L4/5 D Peron Tib Ant 5 5    PF S1/2 Tibial Grc/Sol 5 5    Sensation:  Light touch Intact throughout   Pin prick    Temperature    Vibration   Proprioception    Coordination/Complex Motor:  - Finger to Nose intact BL - Heel to shin intact bL - Rapid alternating movement are slowed in LUE - Gait: deferred.  Labs   CBC:  Recent Labs  Lab 05/07/22 1258 05/10/22 0505  WBC 6.8 6.1  NEUTROABS 5.3 4.7  HGB 12.6* 11.8*  HCT 37.2* 35.2*  MCV 93.2 93.4  PLT 261 932    Basic Metabolic Panel:  Lab Results  Component Value Date   NA 140 05/10/2022   K 3.6 05/10/2022   CO2 21 (L) 05/10/2022   GLUCOSE 119 (H) 05/10/2022   BUN 28 (H) 05/10/2022   CREATININE 1.47 (H) 05/10/2022   CALCIUM 9.5 05/10/2022   GFRNONAA 48 (L) 05/10/2022   GFRAA 52 (L) 07/26/2020   Lipid Panel:  Lab Results  Component Value Date   LDLCALC 24 05/08/2022   HgbA1c:  Lab Results  Component Value Date   HGBA1C 6.1 (H) 05/07/2022   Urine Drug Screen: No results found for: "LABOPIA", "COCAINSCRNUR", "LABBENZ", "AMPHETMU", "THCU", "LABBARB"  Alcohol Level No results found for: "ETH"  CT Head without contrast(Personally reviewed): 1. Focal area of low attenuation within the right corona radiata corresponding to areas of restricted diffusion on recent MRI and compatible with evolutionary changes of previously noted acute to early subacute infarcts noted on 05/07/2022. 2. Chronic small vessel ischemic disease and brain atrophy.  CT angio Head and Neck with contrast(Personally reviewed): 1. Negative for large vessel occlusion.   But Positive for complex plaque in the proximal Right ICA with a possible small focus of superimposed adherent thrombus (series 8, image 141 and series 4, image 113). Hemodynamically significant stenosis there estimated at 60%.   And positive also for bulky bilateral calcified plaque of the Dominant Right Vertebral V4 segment with up to Severe stenosis. This is distal to the Right PICA, and the  Basilar artery is patent without stenosis.   2. Additionally, mild to moderate Left ICA siphon petrous and cavernous segment atherosclerotic stenosis.  MRI Brain(Personally reviewed): New acute infarct in the right corona radiata, adjacent to the previously noted acute infarcts on 05/07/2022.  Impression   Patrick Moss is a 81 y.o. male with PMH significant for BPH, CKD 3A, hyperlipidemia, hypertension, osteoarthritis, diabetes type 2 on insulin who presents with left hand weakness. Found to have a new acute R corona radiata stroke just adjacent to his  earlier R IC stroke noted on MRI from 11/22. Etiology of this stroke is likely embolic from the known bulky R ICA plaque with superimposed adherent thrombus.  Recommendations   - Frequent Neuro checks per stroke unit protocol - Recommend obtaining TTE - Recommend obtaining Lipid panel with LDL - Please start statin if LDL > 70 - Recommend HbA1c - Antithrombotic - continue Aspirin '81mg'$  daily along with plavix '75mg'$  daily. - Recommend DVT ppx - SBP goal - gradual normotension. - Recommend Telemetry monitoring for arrythmia - Recommend bedside swallow screen prior to PO intake. - Stroke education booklet - Recommend PT/OT/SLP consult - recommend discussion with Neuro IR and stroke team in AM to see if the planned angiogram and intervention should be done sooner.  ______________________________________________________________________  Thank you for the opportunity to take part in the care of this patient. If you have any further questions, please contact the neurology consultation attending.  Signed,  [place] Pager Number [phone] _ _ _   _ __   _ __ _ _  __ __   _ __   __ _

## 2022-05-11 ENCOUNTER — Observation Stay (HOSPITAL_COMMUNITY): Payer: Medicare HMO

## 2022-05-11 DIAGNOSIS — I6521 Occlusion and stenosis of right carotid artery: Secondary | ICD-10-CM | POA: Diagnosis not present

## 2022-05-11 DIAGNOSIS — E1169 Type 2 diabetes mellitus with other specified complication: Secondary | ICD-10-CM | POA: Diagnosis present

## 2022-05-11 DIAGNOSIS — Z7902 Long term (current) use of antithrombotics/antiplatelets: Secondary | ICD-10-CM | POA: Diagnosis not present

## 2022-05-11 DIAGNOSIS — M170 Bilateral primary osteoarthritis of knee: Secondary | ICD-10-CM | POA: Diagnosis present

## 2022-05-11 DIAGNOSIS — R471 Dysarthria and anarthria: Secondary | ICD-10-CM | POA: Diagnosis present

## 2022-05-11 DIAGNOSIS — I771 Stricture of artery: Secondary | ICD-10-CM | POA: Diagnosis not present

## 2022-05-11 DIAGNOSIS — R1312 Dysphagia, oropharyngeal phase: Secondary | ICD-10-CM | POA: Diagnosis present

## 2022-05-11 DIAGNOSIS — R29705 NIHSS score 5: Secondary | ICD-10-CM | POA: Diagnosis present

## 2022-05-11 DIAGNOSIS — I63239 Cerebral infarction due to unspecified occlusion or stenosis of unspecified carotid arteries: Secondary | ICD-10-CM

## 2022-05-11 DIAGNOSIS — E1122 Type 2 diabetes mellitus with diabetic chronic kidney disease: Secondary | ICD-10-CM | POA: Diagnosis present

## 2022-05-11 DIAGNOSIS — I6529 Occlusion and stenosis of unspecified carotid artery: Secondary | ICD-10-CM | POA: Diagnosis not present

## 2022-05-11 DIAGNOSIS — I152 Hypertension secondary to endocrine disorders: Secondary | ICD-10-CM | POA: Diagnosis present

## 2022-05-11 DIAGNOSIS — I63411 Cerebral infarction due to embolism of right middle cerebral artery: Secondary | ICD-10-CM | POA: Diagnosis not present

## 2022-05-11 DIAGNOSIS — D631 Anemia in chronic kidney disease: Secondary | ICD-10-CM | POA: Diagnosis not present

## 2022-05-11 DIAGNOSIS — I639 Cerebral infarction, unspecified: Secondary | ICD-10-CM | POA: Diagnosis present

## 2022-05-11 DIAGNOSIS — N189 Chronic kidney disease, unspecified: Secondary | ICD-10-CM | POA: Diagnosis not present

## 2022-05-11 DIAGNOSIS — N400 Enlarged prostate without lower urinary tract symptoms: Secondary | ICD-10-CM | POA: Diagnosis present

## 2022-05-11 DIAGNOSIS — E1165 Type 2 diabetes mellitus with hyperglycemia: Secondary | ICD-10-CM | POA: Diagnosis present

## 2022-05-11 DIAGNOSIS — I2510 Atherosclerotic heart disease of native coronary artery without angina pectoris: Secondary | ICD-10-CM | POA: Diagnosis present

## 2022-05-11 DIAGNOSIS — I10 Essential (primary) hypertension: Secondary | ICD-10-CM

## 2022-05-11 DIAGNOSIS — R270 Ataxia, unspecified: Secondary | ICD-10-CM | POA: Diagnosis present

## 2022-05-11 DIAGNOSIS — R040 Epistaxis: Secondary | ICD-10-CM | POA: Diagnosis not present

## 2022-05-11 DIAGNOSIS — Z7982 Long term (current) use of aspirin: Secondary | ICD-10-CM | POA: Diagnosis not present

## 2022-05-11 DIAGNOSIS — Z79899 Other long term (current) drug therapy: Secondary | ICD-10-CM | POA: Diagnosis not present

## 2022-05-11 DIAGNOSIS — E782 Mixed hyperlipidemia: Secondary | ICD-10-CM | POA: Diagnosis present

## 2022-05-11 DIAGNOSIS — K219 Gastro-esophageal reflux disease without esophagitis: Secondary | ICD-10-CM | POA: Diagnosis present

## 2022-05-11 DIAGNOSIS — D649 Anemia, unspecified: Secondary | ICD-10-CM | POA: Diagnosis present

## 2022-05-11 DIAGNOSIS — I63231 Cerebral infarction due to unspecified occlusion or stenosis of right carotid arteries: Secondary | ICD-10-CM | POA: Diagnosis present

## 2022-05-11 DIAGNOSIS — G8324 Monoplegia of upper limb affecting left nondominant side: Secondary | ICD-10-CM | POA: Diagnosis present

## 2022-05-11 DIAGNOSIS — R29810 Facial weakness: Secondary | ICD-10-CM | POA: Diagnosis present

## 2022-05-11 DIAGNOSIS — N1831 Chronic kidney disease, stage 3a: Secondary | ICD-10-CM | POA: Diagnosis present

## 2022-05-11 DIAGNOSIS — I6501 Occlusion and stenosis of right vertebral artery: Secondary | ICD-10-CM | POA: Diagnosis present

## 2022-05-11 DIAGNOSIS — I129 Hypertensive chronic kidney disease with stage 1 through stage 4 chronic kidney disease, or unspecified chronic kidney disease: Secondary | ICD-10-CM | POA: Diagnosis not present

## 2022-05-11 DIAGNOSIS — Z794 Long term (current) use of insulin: Secondary | ICD-10-CM | POA: Diagnosis not present

## 2022-05-11 LAB — CBC
HCT: 33.3 % — ABNORMAL LOW (ref 39.0–52.0)
Hemoglobin: 11.1 g/dL — ABNORMAL LOW (ref 13.0–17.0)
MCH: 31.4 pg (ref 26.0–34.0)
MCHC: 33.3 g/dL (ref 30.0–36.0)
MCV: 94.3 fL (ref 80.0–100.0)
Platelets: 220 10*3/uL (ref 150–400)
RBC: 3.53 MIL/uL — ABNORMAL LOW (ref 4.22–5.81)
RDW: 14.3 % (ref 11.5–15.5)
WBC: 5.1 10*3/uL (ref 4.0–10.5)
nRBC: 0 % (ref 0.0–0.2)

## 2022-05-11 LAB — BASIC METABOLIC PANEL
Anion gap: 10 (ref 5–15)
BUN: 17 mg/dL (ref 8–23)
CO2: 22 mmol/L (ref 22–32)
Calcium: 9.4 mg/dL (ref 8.9–10.3)
Chloride: 111 mmol/L (ref 98–111)
Creatinine, Ser: 1.32 mg/dL — ABNORMAL HIGH (ref 0.61–1.24)
GFR, Estimated: 55 mL/min — ABNORMAL LOW (ref >60)
Glucose, Bld: 78 mg/dL (ref 70–99)
Potassium: 3.6 mmol/L (ref 3.5–5.1)
Sodium: 143 mmol/L (ref 135–145)

## 2022-05-11 LAB — GLUCOSE, CAPILLARY
Glucose-Capillary: 108 mg/dL — ABNORMAL HIGH (ref 70–99)
Glucose-Capillary: 132 mg/dL — ABNORMAL HIGH (ref 70–99)
Glucose-Capillary: 154 mg/dL — ABNORMAL HIGH (ref 70–99)
Glucose-Capillary: 70 mg/dL (ref 70–99)
Glucose-Capillary: 72 mg/dL (ref 70–99)
Glucose-Capillary: 78 mg/dL (ref 70–99)
Glucose-Capillary: 83 mg/dL (ref 70–99)

## 2022-05-11 NOTE — Evaluation (Signed)
Occupational Therapy Evaluation Patient Details Name: Patrick Moss MRN: [id number] DOB: 29-Jul-1940 Today's Date: 05/11/2022   History of Present Illness 81 y.o. male who presents 05/10/22 with left hand weakness x 3 days. NIHSS=4; MRI  acute infarct in the right corona radiata, adjacent to the  previously noted acute infarcts on 05/07/2022.   PMH significant for BPH, CKD 3A, hyperlipidemia, hypertension, osteoarthritis, diabetes type 2 on insulin, prostate malignancy   Clinical Impression   PTA, pt reports that he was independent in ADL and IADL. Upon eval, pt presents with decreased LUE strength, coordination, and dexterity; decreased balance, problem solving, memory, executive function, and activity tolerance. Pt performing UB ADL with set-up, LB ADL with min guard A, and grooming with up to min A. Pt reports brother has been staying with him since prior stroke and willing to stay upon discharge. Recommending HHOT to optimize safety and independence in ADL and IADL upon discharge.     Recommendations for follow up therapy are one component of a multi-disciplinary discharge planning process, led by the attending physician.  Recommendations may be updated based on patient status, additional functional criteria and insurance authorization.   Follow Up Recommendations  Home health OT     Assistance Recommended at Discharge Intermittent Supervision/Assistance  Patient can return home with the following A little help with walking and/or transfers;A little help with bathing/dressing/bathroom;Help with stairs or ramp for entrance;Assistance with cooking/housework;Direct supervision/assist for medications management;Direct supervision/assist for financial management;Assist for transportation;Assistance with feeding    Functional Status Assessment  Patient has had a recent decline in their functional status and demonstrates the ability to make significant improvements in function in a reasonable  and predictable amount of time.  Equipment Recommendations  None recommended by OT    Recommendations for Other Services       Precautions / Restrictions Precautions Precautions: Fall Restrictions Weight Bearing Restrictions: No      Mobility Bed Mobility               General bed mobility comments: in recliner on arrival and departure    Transfers Overall transfer level: Needs assistance Equipment used: Straight cane Transfers: Sit to/from Stand Sit to Stand: Supervision           General transfer comment: no imbalance or dizziness; supervision for safety as had not been on his feet since coming to hospital      Balance Overall balance assessment: Needs assistance Sitting-balance support: No upper extremity supported, Feet supported Sitting balance-Leahy Scale: Good Sitting balance - Comments: can tolerate strength testing with either leg without imbalance   Standing balance support: Single extremity supported Standing balance-Leahy Scale: Poor Standing balance comment: likely can stand without UE support, just not assessed                           ADL either performed or assessed with clinical judgement   ADL Overall ADL's : Needs assistance/impaired Eating/Feeding: Minimal assistance;Sitting Eating/Feeding Details (indicate cue type and reason): min A for bil tasks Grooming: Oral care;Minimal assistance;Standing Grooming Details (indicate cue type and reason): Min A for light hand over hand to optimize grasp/dexterity for L hand to hold toothpaste tube while unscrewing with R hand. Upper Body Bathing: Set up;Sitting   Lower Body Bathing: Min guard;Sit to/from stand   Upper Body Dressing : Set up;Sitting   Lower Body Dressing: Min guard;Sit to/from stand   Toilet Transfer: Min guard;Ambulation (cane)   Toileting- Clothing  Manipulation and Hygiene: Min guard;Sit to/from stand       Functional mobility during ADLs: Min  guard;Cane General ADL Comments: limited by cognition and balance.     Vision Baseline Vision/History: 0 No visual deficits Ability to See in Adequate Light: 0 Adequate Patient Visual Report: No change from baseline Vision Assessment?: No apparent visual deficits Additional Comments: perhaps some decreasd visual attention overall, but able to perform all visual testing [facility]     Perception Perception Perception Tested?: No   Praxis Praxis Praxis tested?: Not tested    Pertinent Vitals/Pain Pain Assessment Faces Pain Scale: Hurts a little bit Pain Location: Rt lateral knee (chronic and why he uses cane) Pain Descriptors / Indicators: Discomfort     Hand Dominance Right   Extremity/Trunk Assessment Upper Extremity Assessment Upper Extremity Assessment: Generalized weakness;LUE deficits/detail LUE Deficits / Details: decresased dexterity, finger opposition, strength 3+/5. Using functionally for cane use, but difficulty with manipulation of smaller objects. LUE Coordination: decreased fine motor;decreased gross motor   Lower Extremity Assessment Lower Extremity Assessment: Defer to PT evaluation RLE Deficits / Details: knee extension 4+/5; pt reports limited by chronic rt knee pain s/p rt femur fx   Cervical / Trunk Assessment Cervical / Trunk Assessment: Kyphotic   Communication Communication Communication: Expressive difficulties   Cognition Arousal/Alertness: Awake/alert Behavior During Therapy: WFL for tasks assessed/performed Overall Cognitive Status: Impaired/Different from baseline Area of Impairment: Attention, Memory, Following commands, Safety/judgement, Awareness, Problem solving                   Current Attention Level: Sustained Memory: Decreased short-term memory Following Commands: Follows one step commands consistently, Follows one step commands with increased time, Follows multi-step commands inconsistently Safety/Judgement: Decreased awareness of  deficits Awareness: Intellectual, Emergent Problem Solving: Slow processing, Difficulty sequencing, Requires verbal cues General Comments: Pt scoring a 16 on the short blessed test indicatinmg impairment consistent with dementia. Max difficulty with delayed memory recall and stating months of the year in the reverse order. Pt with one error while counting backward. Pt following simple  3 step scavenger hunt command in order stated with mod+ cues. Moderately distractable in minimally distracting environment.     General Comments  VSS    Exercises     Shoulder Instructions      Home Living Family/patient expects to be discharged to:: Private residence Living Arrangements: Alone Available Help at Discharge: Family;Available 24 hours/day (brother has been staying with him since first stroke) Type of Home: House Home Access: Stairs to enter [employer] of Steps: 2 Entrance Stairs-Rails: Right Home Layout: Two level Alternate Level Stairs-Number of Steps: 3 Alternate Level Stairs-Rails: Right Bathroom Shower/Tub: Walk-in shower         Home Equipment: Cane - single point;Shower seat - built in;Grab bars - toilet;Grab bars - tub/shower          Prior Functioning/Environment Prior Level of Function : Independent/Modified Independent             Mobility Comments: modified independent with cane; even does his own yardwork with riding mower; was getting up leaves just the other day          OT Problem List: Decreased strength;Decreased activity tolerance;Impaired balance (sitting and/or standing);Decreased cognition;Decreased coordination;Decreased safety awareness;Decreased knowledge of use of DME or AE;Impaired UE functional use      OT Treatment/Interventions: Self-care/ADL training;Therapeutic exercise;Therapeutic activities;Cognitive remediation/compensation;Patient/family education;Balance training;DME and/or AE instruction    OT Goals(Current goals can be  found in the care plan  section) Acute Rehab OT Goals Patient Stated Goal: go home OT Goal Formulation: With patient Time For Goal Achievement: 05/25/22 Potential to Achieve Goals: Good  OT Frequency: Min 2X/week    Co-evaluation              AM-PAC OT "6 Clicks" Daily Activity     Outcome Measure Help from another person eating meals?: A Little Help from another person taking care of personal grooming?: A Little Help from another person toileting, which includes using toliet, bedpan, or urinal?: A Little Help from another person bathing (including washing, rinsing, drying)?: A Little Help from another person to put on and taking off regular upper body clothing?: A Little Help from another person to put on and taking off regular lower body clothing?: A Little 6 Click Score: 18   End of Session Equipment Utilized During Treatment: Gait belt (cane) Nurse Communication: Mobility status  Activity Tolerance: Patient tolerated treatment well Patient left: in chair;with call bell/phone within reach;with chair alarm set  OT Visit Diagnosis: Unsteadiness on feet (R26.81);Muscle weakness (generalized) (M62.81);Other abnormalities of gait and mobility (R26.89);Other symptoms and signs involving cognitive function                Time: 1610-9604 OT Time Calculation (min): 34 min Charges:  OT General Charges $OT Visit: 1 Visit OT Evaluation $OT Eval Moderate Complexity: 1 Mod OT Treatments $Self Care/Home Management : 8-22 mins  Elder Cyphers, OTR/L [facility] Acute Rehabilitation Office: [phone]   Magnus Ivan 05/11/2022, 9:48 AM

## 2022-05-11 NOTE — Evaluation (Signed)
Speech Language Pathology Evaluation Patient Details Name: Patrick Moss MRN: [id number] DOB: Jun 16, 1941 Today's Date: 05/11/2022 Time: 0700-0730 SLP Time Calculation (min) (ACUTE ONLY): 30 min  Problem List:  Patient Active Problem List   Diagnosis Date Noted   Carotid stenosis, right 05/10/2022   Dysphagia 05/10/2022   Stage 3a chronic kidney disease (CKD) ([place]) 05/10/2022   Stroke (cerebrum) ([place]) 05/10/2022   Acute ischemic stroke ([place]) 05/07/2022   GERD (gastroesophageal reflux disease) 05/07/2022   OA (osteoarthritis) 05/07/2022   At high risk for falls 09/04/2021   Senile osteoporosis 09/04/2021   Closed fracture of right hip ([place]) 05/02/2021   Uncontrolled type 2 diabetes mellitus with hyperglycemia ([place]) 04/07/2019   Intractable hiccups 09/10/2018   Mixed hyperlipidemia 01/19/2017   Class 1 obesity due to excess calories without serious comorbidity with body mass index (BMI) of 32.0 to 32.9 in adult 10/14/2016   Hypertension associated with diabetes ([place]) 03/12/2016   Uncontrolled type 2 diabetes mellitus without complication, without long-term current use of insulin 05/16/2015   Essential hypertension, benign 05/16/2015   Tinea 11/02/2013   Type 2 diabetes with nephropathy ([place]) 11/02/2013   Past Medical History:  Past Medical History:  Diagnosis Date   Borderline glaucoma of both eyes    BPH associated with nocturia    CKD (chronic kidney disease), stage II    Complication of anesthesia    per pt emergent hiccups   Hyperlipidemia, mixed    Hypertension    Malignant neoplasm prostate ([place]) 11/2021   primary urologist---  dr dahlstadt/  radiation oncology [facility];  dx 06/ 2023,  Gleason 4+3,  PSA  13.2   OA (osteoarthritis)    knees   Type 2 diabetes mellitus treated with insulin ([place])    followed by pcp ( previous seen by endocriologist--- dr g. nida ,lov note in epic 02-19-2021);    (02-11-2022  pt stated checks blood sugar twice daily,   fasting sugar average 90s  to 120s)   Wears dentures    upper   Wears glasses    Past Surgical History:  Past Surgical History:  Procedure Laterality Date   FEMUR IM NAIL Right 05/03/2021   '@[facility]'$ --  W-S   FOOT FRACTURE SURGERY Right    2017;   ORIF , per pt plate under foot   GOLD SEED IMPLANT N/A 02/18/2022   Procedure: GOLD SEED IMPLANT;  Surgeon: Remi Haggard, MD;  Location: [facility];  Service: Urology;  Laterality: N/A;  [place] Bilateral 2007   approx   SPACE OAR INSTILLATION N/A 02/18/2022   Procedure: SPACE OAR INSTILLATION;  Surgeon: Remi Haggard, MD;  Location: [facility];  Service: Urology;  Laterality: N/A;   HPI:  Patrick Moss is a 81 y.o. male  was recently discharged from the hospital on 11/23 after being evaluated for acute/subacute stroke of right corona radiata.  At that time, he had presented with right facial droop and slurred speech.  He was noted to have carotid stenosis on carotid ultrasound.   He reports that during his hospital stay, he did have some mild sensation of dysphagia, although it appears he had passed his swallow screen and was not seen by speech therapy.  The patient was ambulating with his cane which she has chronically done due to right lower extremity arthritis and pain.  After returning home, he reported worsening of his dysphagia/dysarthria.  He noted that he had  trouble maintaining his oral secretions causing him to drool out of the left side of his mouth.  Then he began noticing weakness in his left hand.  He was having trouble holding his cane and was dropping things from his left hand.  Due to development of new symptoms, he came to the ER for evaluation. New acute infarct in the right corona radiata, adjacent to the  previously noted acute infarcts on 05/07/2022.   Assessment / Plan / Recommendation Clinical Impression  Pt presents with primary dysarthria with impaired lingual  strength impacting precision of articulation, though placement acurate and pt mostly intelligible. Resonance also abnormal with concern for weakness/sluggish movement of the velum. Pts memory and problem solving appear mildly impacted; pts effort with clock drawing showed poor planning and awareness, though pt able to identify errors in retrospect. Short term/working memory errors noted. Pt would benefit from intesive speech therapy in AIR setting prior to d/c home where he does have familial support.    SLP Assessment  SLP Recommendation/Assessment: Patient needs continued Speech Lanaguage Pathology Services SLP Visit Diagnosis: Dysarthria and anarthria (R47.1);Cognitive communication deficit (R41.841)    Recommendations for follow up therapy are one component of a multi-disciplinary discharge planning process, led by the attending physician.  Recommendations may be updated based on patient status, additional functional criteria and insurance authorization.    Follow Up Recommendations  Acute inpatient rehab (3hours/day)    Assistance Recommended at Discharge  Intermittent Supervision/Assistance  Functional Status Assessment Patient has had a recent decline in their functional status and demonstrates the ability to make significant improvements in function in a reasonable and predictable amount of time.  Frequency and Duration min 2x/week  2 weeks      SLP Evaluation Cognition  Overall Cognitive Status: Impaired/Different from baseline Arousal/Alertness: Awake/alert Orientation Level: Oriented X4 Attention: Selective;Alternating Selective Attention: Appears intact Alternating Attention: Appears intact Memory: Impaired Memory Impairment: Storage deficit Awareness: Appears intact Problem Solving: Impaired Problem Solving Impairment: Verbal complex;Functional complex Executive Function: Reasoning Reasoning: Impaired Reasoning Impairment: Functional basic Safety/Judgment: Appears  intact       Comprehension  Auditory Comprehension Overall Auditory Comprehension: Appears within functional limits for tasks assessed    Expression Verbal Expression Overall Verbal Expression: Appears within functional limits for tasks assessed   Oral / Motor  Oral Motor/Sensory Function Overall Oral Motor/Sensory Function: Moderate impairment Facial ROM: Reduced left;Suspected CN VII (facial) dysfunction Facial Symmetry: Abnormal symmetry left;Suspected CN VII (facial) dysfunction Facial Strength: Reduced left;Suspected CN VII (facial) dysfunction Facial Sensation: Within Functional Limits Lingual ROM: Reduced left;Suspected CN XII (hypoglossal) dysfunction Lingual Symmetry: Abnormal symmetry left;Suspected CN XII (hypoglossal) dysfunction Lingual Strength: Reduced;Suspected CN XII (hypoglossal) dysfunction Lingual Sensation: Within Functional Limits Velum: Suspected CN X (Vagus) dysfunction Mandible: Within Functional Limits Motor Speech Overall Motor Speech: Impaired Respiration: Within functional limits Phonation: Normal Resonance: Hypernasality Articulation: Impaired Level of Impairment: Word Intelligibility: Intelligible Motor Planning: Witnin functional limits Motor Speech Errors: Aware;Consistent Interfering Components: Inadequate dentition Effective Techniques: Over-articulate            Arwen Haseley, Katherene Ponto 05/11/2022, 7:40 AM

## 2022-05-11 NOTE — Evaluation (Signed)
Physical Therapy Evaluation Patient Details Name: Patrick Moss MRN: [id number] DOB: Jan 18, 1941 Today's Date: 05/11/2022  History of Present Illness  81 y.o. male who presents 05/10/22 with left hand weakness x 3 days. NIHSS=4; MRI  acute infarct in the right corona radiata, adjacent to the  previously noted acute infarcts on 05/07/2022.   PMH significant for BPH, CKD 3A, hyperlipidemia, hypertension, osteoarthritis, diabetes type 2 on insulin, prostate malignancy  Clinical Impression   Pt admitted secondary to problem above with deficits below. PTA patient was living at home with his brother staying with him 24/7. He has 2 steps to enter and 3 interior steps, each with rails.  Pt currently requires minguard assist for basic mobility including bed mobility, transfers, and gait with cane x 100 ft. He reports his ambulation is close to baseline, just not as fast as he normally would walk.  Anticipate patient will benefit from PT to address problems listed below.Will continue to follow acutely to maximize functional mobility independence and safety.          Recommendations for follow up therapy are one component of a multi-disciplinary discharge planning process, led by the attending physician.  Recommendations may be updated based on patient status, additional functional criteria and insurance authorization.  Follow Up Recommendations Home health PT      Assistance Recommended at Discharge PRN  Patient can return home with the following  Help with stairs or ramp for entrance;Assist for transportation    Equipment Recommendations None recommended by PT  Recommendations for Other Services       Functional Status Assessment Patient has had a recent decline in their functional status and demonstrates the ability to make significant improvements in function in a reasonable and predictable amount of time.     Precautions / Restrictions Precautions Precautions: Fall      Mobility  Bed  Mobility Overal bed mobility: Needs Assistance Bed Mobility: Supine to Sit     Supine to sit: Min guard     General bed mobility comments: exit to his right with HOB flat, pt using rail and momentum from his legs to come to sit with near need for assist (required 2 tries to get up)    Transfers Overall transfer level: Needs assistance Equipment used: Straight cane Transfers: Sit to/from Stand Sit to Stand: Supervision           General transfer comment: no imbalance or dizziness; supervision for safety as had not been on his feet since coming to hospital    Ambulation/Gait Ambulation/Gait assistance: Min guard Gait Distance (Feet): 100 Feet Assistive device: Straight cane Gait Pattern/deviations: Step-through pattern, Decreased step length - right, Decreased step length - left, Decreased stance time - right, Decreased weight shift to right, Trunk flexed   Gait velocity interpretation: 1.31 - 2.62 ft/sec, indicative of limited community ambulator   General Gait Details: pt sequences with cane correctly >90% of the time (uses in his left hand due to chronic rt knee pain); decr wt-shift over RLE; pt reports he is not walking as quickly 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    Modified Rankin (Stroke Patients Only) Modified Rankin (Stroke Patients Only) Pre-Morbid Rankin Score: Moderate disability Modified Rankin: Moderately severe disability     Balance Overall balance assessment: Needs assistance Sitting-balance support: No upper extremity supported, Feet supported Sitting balance-Leahy Scale: Good Sitting balance - Comments: can tolerate strength testing with either leg  without imbalance   Standing balance support: Single extremity supported Standing balance-Leahy Scale: Poor Standing balance comment: likely can stand without UE support, just not assessed                             Pertinent Vitals/Pain Pain  Assessment Pain Assessment: Faces Faces Pain Scale: Hurts a little bit Pain Location: Rt lateral knee (chronic and why he uses cane) Pain Descriptors / Indicators: Discomfort Pain Intervention(s): Limited activity within patient's tolerance    Home Living Family/patient expects to be discharged to:: Private residence Living Arrangements: Alone Available Help at Discharge: Family;Available 24 hours/day (brother has been staying with him since first stroke) Type of Home: House Home Access: Stairs to enter Entrance Stairs-Rails: Right Entrance Stairs-Number of Steps: 2 Alternate Level Stairs-Number of Steps: 3 Home Layout: Two level Home Equipment: Cane - single point;Shower seat - built in;Grab bars - toilet;Grab bars - tub/shower      Prior Function Prior Level of Function : Independent/Modified Independent             Mobility Comments: modified independent with cane; even does his own yardwork with riding mower; was getting up leaves just the other day       Hand Dominance   Dominant Hand: Right    Extremity/Trunk Assessment   Upper Extremity Assessment Upper Extremity Assessment: Defer to OT evaluation    Lower Extremity Assessment Lower Extremity Assessment: RLE deficits/detail (LLE WNL strength, sensation, coordination) RLE Deficits / Details: knee extension 4+/5; pt reports limited by chronic rt knee pain s/p rt femur fx    Cervical / Trunk Assessment Cervical / Trunk Assessment: Kyphotic  Communication   Communication: Expressive difficulties  Cognition Arousal/Alertness: Awake/alert Behavior During Therapy: WFL for tasks assessed/performed Overall Cognitive Status: Within Functional Limits for tasks assessed                                 General Comments: not assessed beyond memory and following commands        General Comments      Exercises     Assessment/Plan    PT Assessment Patient needs continued PT services  PT Problem  List Decreased strength;Decreased balance;Decreased mobility;Decreased knowledge of use of DME       PT Treatment Interventions DME instruction;Gait training;Stair training;Functional mobility training;Therapeutic activities;Therapeutic exercise;Balance training;Neuromuscular re-education;Patient/family education    PT Goals (Current goals can be found in the Care Plan section)  Acute Rehab PT Goals Patient Stated Goal: return home with [facility] therapies PT Goal Formulation: With patient Time For Goal Achievement: 05/25/22 Potential to Achieve Goals: Good    Frequency Min 3X/week     Co-evaluation               AM-PAC PT "6 Clicks" Mobility  Outcome Measure Help needed turning from your back to your side while in a flat bed without using bedrails?: A Little Help needed moving from lying on your back to sitting on the side of a flat bed without using bedrails?: A Little Help needed moving to and from a bed to a chair (including a wheelchair)?: A Little Help needed standing up from a chair using your arms (e.g., wheelchair or bedside chair)?: A Little Help needed to walk in hospital room?: A Little Help needed climbing 3-5 steps with a railing? : A Little 6 Click Score: 18  End of Session Equipment Utilized During Treatment: Gait belt Activity Tolerance: Patient tolerated treatment well Patient left: in chair;with call bell/phone within reach;Other (comment) (with OT arriving for evaluation) Nurse Communication: Mobility status;Other (comment) (IV was disconnected and dripping in bed; paused IV) PT Visit Diagnosis: Other abnormalities of gait and mobility (R26.89)    Time: 8502-7741 PT Time Calculation (min) (ACUTE ONLY): 22 min   Charges:   PT Evaluation $PT Eval Low Complexity: [place], PT Acute Rehabilitation Services  Office [phone]   Rexanne Mano 05/11/2022, 9:11 AM

## 2022-05-11 NOTE — Progress Notes (Signed)
[facility] PROGRESS NOTE   Patrick Moss LTJ:[id number] DOB: [birth year] DOA: 05/10/2022  PCP: [name], MD  Brief History/Interval Summary: 81 y.o. male with medical history significant for hypertension, diabetes, chronic kidney disease stage IIIa, was recently discharged from the hospital on 11/23 after being evaluated for acute/subacute stroke of right corona radiata.  At that time, he had presented with facial droop and slurred speech.  He was noted to have carotid stenosis on carotid ultrasound.  Seen by neurology with recommendations for outpatient follow-up with interventional radiology to be considered for stenting.  He was started on aspirin and Plavix. After returning home, he reported worsening of his dysphagia/dysarthria.  He noted that he had trouble maintaining his oral secretions causing him to drool out of the left side of his mouth.  Subsequently, he began noticing weakness in his left hand.  He was having trouble holding his cane and was dropping things from his left hand.  Due to development of new symptoms, he came to the ER for evaluation.  Patient was transferred over to [facility] for further evaluation. A new stroke was identified on MRI.  Consultants: Neurology  Procedures: None    Subjective/Interval History: Patient states that he has been having difficulty speaking.  Continues to feel weak in the left hand.     Assessment/Plan:  Acute stroke Patient has had recurrent strokes.  Recently hospitalized for same.  Has a right RCA stenosis of 60%. Presented with left hand weakness and difficulty speaking and difficulty swallowing. MRI shows new stroke. Patient transferred to [facility].  To be seen by stroke service today.  Might need intervention to the right ICA. Patient currently on aspirin and Plavix.  Noted to be on rosuvastatin and Zetia. LDL was noted to be 24 when checked recently.  HbA1c 6.1. Echocardiogram showed normal systolic  function of the left ventricle with grade 1 diastolic dysfunction. PT OT SLP to continue to evaluate.  Essential hypertension Allowing permissive hypertension.  Holding valsartan hydrochlorothiazide and amlodipine.  Chronic kidney disease stage IIIa Seems to be at baseline.  Diabetes mellitus type 2, controlled Holding all home medications including metformin Trulicity basal insulin.  Monitor CBGs.  Continue SSI.  Oropharyngeal dysphagia Speech therapy is following.  GERD Continue PPI.  Normocytic anemia No evidence of overt bleeding.  Continue to monitor.  DVT Prophylaxis: Lovenox Code Status: Full code Family Communication: Discussed with patient Disposition Plan: To be determined  Status is: Observation The patient will require care spanning > 2 midnights and should be moved to inpatient because: Acute stroke requiring intervention      Medications: Scheduled:  aspirin  325 mg Oral Daily   clopidogrel  75 mg Oral Daily   enoxaparin (LOVENOX) injection  40 mg Subcutaneous Q24H   ezetimibe  10 mg Oral Daily   insulin aspart  0-15 Units Subcutaneous Q4H   pantoprazole  40 mg Oral Daily   rosuvastatin  5 mg Oral Daily   Continuous:  sodium chloride 100 mL/hr at 05/11/22 0762   UQJ:[id number] **OR** acetaminophen (TYLENOL) oral liquid 160 mg/5 mL **OR** acetaminophen, senna-docusate  Antibiotics: Anti-infectives (From admission, onward)    None       Objective:  Vital Signs  Vitals:   05/10/22 2042 05/11/22 0019 05/11/22 0453 05/11/22 0754  BP: (!) 128/57 110/60 132/63 (!) 149/77  Pulse: 64 65 77 62  Resp: '16 17 16 18  '$ Temp: 99.2 F (37.3 C) 98.3 F (36.8 C) 98.7 F (37.1  C) 98.4 F (36.9 C)  TempSrc: Oral Oral Oral Oral  SpO2: 98% 99% 98% 98%  Weight:      Height:        Intake/Output Summary (Last 24 hours) at 05/11/2022 1004 Last data filed at 05/11/2022 0629 Gross per 24 hour  Intake 1249 ml  Output 1050 ml  Net 199 ml   Filed  Weights   05/10/22 0404  Weight: 81 kg    General appearance: Awake alert.  In no distress Resp: Clear to auscultation bilaterally.  Normal effort Cardio: S1-S2 is normal regular.  No S3-S4.  No rubs murmurs or bruit GI: Abdomen is soft.  Nontender nondistended.  Bowel sounds are present normal.  No masses organomegaly Extremities: No edema.  Full range of motion of lower extremities. Neurologic: Alert and oriented x3.  Dysarthria noted.  Weakness of the left hand noted.   Lab Results:  Data Reviewed: I have personally reviewed following labs and reports of the imaging studies  CBC: Recent Labs  Lab 05/07/22 1258 05/10/22 0505 05/11/22 0332  WBC 6.8 6.1 5.1  NEUTROABS 5.3 4.7  --   HGB 12.6* 11.8* 11.1*  HCT 37.2* 35.2* 33.3*  MCV 93.2 93.4 94.3  PLT 261 239 932    Basic Metabolic Panel: Recent Labs  Lab 05/07/22 1258 05/08/22 0447 05/10/22 0505 05/10/22 1022 05/11/22 0332  NA 139 141 140  --  143  K 3.8 4.0 3.6  --  3.6  CL 105 111 109  --  111  CO2 25 25 21*  --  22  GLUCOSE 132* 91 119*  --  78  BUN 24* 22 28*  --  17  CREATININE 1.35* 1.38* 1.47*  --  1.32*  CALCIUM 9.8 9.3 9.5  --  9.4  MG  --   --   --  2.2  --     GFR: Estimated Creatinine Clearance: 44.6 mL/min (A) (by C-G formula based on SCr of 1.32 mg/dL (H)).  Liver Function Tests: Recent Labs  Lab 05/07/22 1258 05/10/22 0505  AST 24 24  ALT 32 30  ALKPHOS 73 60  BILITOT 0.8 0.7  PROT 7.6 6.9  ALBUMIN 4.1 3.9     Coagulation Profile: Recent Labs  Lab 05/10/22 0505  INR 1.0     CBG: Recent Labs  Lab 05/10/22 1606 05/10/22 2041 05/11/22 0016 05/11/22 0456 05/11/22 0754  GLUCAP 89 77 70 78 83     Recent Results (from the past 240 hour(s))  Urine Culture     Status: None   Collection Time: 05/07/22  7:00 PM   Specimen: Urine, Clean Catch  Result Value Ref Range Status   Specimen Description   Final    URINE, CLEAN CATCH Performed at [facility], [address], [place], [place] [address]    Special Requests   Final    NONE Performed at [facility], [address], [place], [place] [address]    Culture   Final    NO GROWTH Performed at [facility], [place] [address] [place], [place] [address]    Report Status 05/09/2022 FINAL  Final      Radiology Studies: MR BRAIN WO CONTRAST  Result Date: 05/11/2022 CLINICAL DATA:  Stroke suspected EXAM: MRI HEAD WITHOUT CONTRAST TECHNIQUE: Multiplanar, multiecho pulse sequences of the brain and surrounding structures were obtained without intravenous contrast. COMPARISON:  05/07/2022 MRI head FINDINGS: Brain: Restricted diffusion with ADC correlate in the right corona radiata, adjacent to  the areas of restricted diffusion seen on the prior MRI. The new focus measures up to 13 x 16 mm in the axial plane (AP x TR) (series 2, image 33). This is associated with increased T2 signal and favored to represent an acute infarct. No acute hemorrhage, mass, mass effect, or midline shift. No hydrocephalus or extra-axial collection. Scattered T2 hyperintense signal in the periventricular white matter, likely the sequela of mild chronic small vessel ischemic disease. Vascular: Normal arterial flow voids. Skull and upper cervical spine: Normal marrow signal. Sinuses/Orbits: Clear paranasal sinuses. Status post bilateral lens replacements. Other: The mastoids are well aerated. IMPRESSION: New acute infarct in the right corona radiata, adjacent to the previously noted acute infarcts on 05/07/2022. These results will be called to the ordering clinician or representative by the Radiologist Assistant, and communication documented in the PACS or [employer]. Electronically Signed   By: Merilyn Baba M.D.   On: 05/11/2022 02:47   CT ANGIO HEAD NECK W WO CM  Result Date: 05/10/2022 CLINICAL DATA:  81 year old male with weakness on the left side, difficulty swallowing. Recent small right corona radiata infarcts on MRI 05/07/2022.  prostate cancer. EXAM: CT ANGIOGRAPHY HEAD AND NECK TECHNIQUE: Multidetector CT imaging of the head and neck was performed using the standard protocol during bolus administration of intravenous contrast. Multiplanar CT image reconstructions and MIPs were obtained to evaluate the vascular anatomy. Carotid stenosis measurements (when applicable) are obtained utilizing NASCET criteria, using the distal internal carotid diameter as the denominator. RADIATION DOSE REDUCTION: This exam was performed according to the departmental dose-optimization program which includes automated exposure control, adjustment of the mA and/or kV according to patient size and/or use of iterative reconstruction technique. CONTRAST:  63m OMNIPAQUE IOHEXOL 350 MG/ML SOLN COMPARISON:  CT head this morning reported separately. Brain MRI and intracranial MRA 05/07/2022. FINDINGS: CTA NECK Skeleton: Advanced cervical spine degeneration. Advanced cervicothoracic junction facet degeneration. But no acute or suspicious osseous lesion identified. Upper chest: Visible central pulmonary arteries appear patent. Visible lungs are largely clear, minor atelectasis. No superior mediastinal lymphadenopathy. Other neck: Negative. Aortic arch: 3 vessel arch configuration.  No arch atherosclerosis. Right carotid system: Minimal right CCA plaque. Right ICA origin and bulb combined soft and calcified plaque with evidence of hemodynamically significant proximal right ICA stenosis on series 4, image 113, numerically estimated at 60 % with respect to the distal vessel. A small amount of adherent thrombus is possible as seen on that image. No additional plaque or stenosis to the skull base. Left carotid system: Minimal plaque and no stenosis. Vertebral arteries: Proximal right subclavian artery is normal. Calcified plaque at the right vertebral artery origin, but only mild origin stenosis. Mild additional V1 segment plaque and stenosis. Right vertebral artery appears  somewhat dominant and is otherwise negative to the skull base. Proximal left subclavian artery is normal. Left vertebral artery origin is normal. There is mild left V1 plaque and tortuosity, mild associated stenosis. Mildly non dominant left vertebral is otherwise negative to the skull base. CTA HEAD Posterior circulation: Dominant right V4 segment. Bulky bilateral V4 segment calcified plaque on series 5, image 286. Mild stenosis on the left. But moderate to severe stenosis in the dominant right V4 segment on series 5, image 279. Both PICA origins occurred early and are patent, proximal to this plaque. Vertebrobasilar junction remains patent without stenosis. Patent basilar artery, basilar tip, SCA and PCA origins. Posterior communicating arteries are diminutive or absent. Bilateral PCA branches are within normal limits. Anterior  circulation: Both ICA siphons are patent. Moderate left ICA petrous soft plaque (series 10, image 123) with mild associated stenosis. Mild to moderate additional left ICA cavernous segment plaque and mild to moderate distal cavernous stenosis on series 5, image 21. Patent supraclinoid left ICA without stenosis. Normal left ophthalmic artery origin. Right ICA siphon mild calcified plaque without stenosis. Patent carotid termini. Patent MCA and ACA origins. Normal anterior communicating artery. Normal bilateral ACA branches except for tortuosity. Left MCA M1 segment and bifurcation are patent without stenosis. Left MCA branches are within normal limits. Right MCA M1 segment and trifurcation are patent without stenosis. Right MCA branches are within normal limits. Venous sinuses: Patent. Anatomic variants: Dominant right vertebral artery. Review of the MIP images confirms the above findings IMPRESSION: 1. Negative for large vessel occlusion. But Positive for complex plaque in the proximal Right ICA with a possible small focus of superimposed adherent thrombus (series 8, image 141 and series 4,  image 113). Hemodynamically significant stenosis there estimated at 60%. And positive also for bulky bilateral calcified plaque of the Dominant Right Vertebral V4 segment with up to Severe stenosis. This is distal to the Right PICA, and the Basilar artery is patent without stenosis. 2. Additionally, mild to moderate Left ICA siphon petrous and cavernous segment atherosclerotic stenosis. Salient findings discussed by telephone with Dr. Godfrey Pick on 05/10/2022 at 08:25 . Electronically Signed   By: Genevie Ann M.D.   On: 05/10/2022 08:28   CT Head Wo Contrast  Result Date: 05/10/2022 CLINICAL DATA:  Neuro deficit. Difficulty swallowing and weakness on left side. EXAM: CT HEAD WITHOUT CONTRAST TECHNIQUE: Contiguous axial images were obtained from the base of the skull through the vertex without intravenous contrast. RADIATION DOSE REDUCTION: This exam was performed according to the departmental dose-optimization program which includes automated exposure control, adjustment of the mA and/or kV according to patient size and/or use of iterative reconstruction technique. COMPARISON:  MR 05/07/2022 FINDINGS: Brain: There is a focal area of low attenuation within the right corona radiata, image 18/3 corresponding to areas of restricted diffusion on recent MRI and compatible with evolutionary changes of previously noted acute to early subacute infarcts noted on 05/07/2022. No signs of acute intracranial hemorrhage, mass or mass effect. No significant hydrocephalus. There is mild diffuse low-attenuation within the subcortical and periventricular white matter compatible with chronic microvascular disease. Prominence of sulci and ventricles compatible with brain atrophy. Vascular: No hyperdense vessel or unexpected calcification. Skull: Normal. Negative for fracture or focal lesion. Sinuses/Orbits: No acute finding. Other: None IMPRESSION: 1. Focal area of low attenuation within the right corona radiata corresponding to areas  of restricted diffusion on recent MRI and compatible with evolutionary changes of previously noted acute to early subacute infarcts noted on 05/07/2022. 2. Chronic small vessel ischemic disease and brain atrophy. Electronically Signed   By: Kerby Moors M.D.   On: 05/10/2022 08:06       LOS: 0 days   [facility] Pager on [email or web address]  05/11/2022, 10:04 AM

## 2022-05-11 NOTE — Plan of Care (Signed)

## 2022-05-11 NOTE — Evaluation (Signed)
Clinical/Bedside Swallow Evaluation Patient Details  Name: MARKEISE MATHEWS MRN: [id number] Date of Birth: [birth year]  Today's Date: 05/11/2022 Time: SLP Start Time (ACUTE ONLY): 0700 SLP Stop Time (ACUTE ONLY): 0730 SLP Time Calculation (min) (ACUTE ONLY): 30 min  Past Medical History:  Past Medical History:  Diagnosis Date   Borderline glaucoma of both eyes    BPH associated with nocturia    CKD (chronic kidney disease), stage II    Complication of anesthesia    per pt emergent hiccups   Hyperlipidemia, mixed    Hypertension    Malignant neoplasm prostate ([place]) 11/2021   primary urologist---  dr d[place]/  radiation oncology [facility];  dx 06/ 2023,  Gleason 4+3,  PSA  13.2   OA (osteoarthritis)    knees   Type 2 diabetes mellitus treated with insulin ([place])    followed by pcp ( previous seen by endocriologist--- dr g. nida ,lov note in epic 02-19-2021);    (02-11-2022  pt stated checks blood sugar twice daily,  fasting sugar average 90s  to 120s)   Wears dentures    upper   Wears glasses    Past Surgical History:  Past Surgical History:  Procedure Laterality Date   FEMUR IM NAIL Right 05/03/2021   '@[facility]'$ --  [facility]   FOOT FRACTURE SURGERY Right    2017;   ORIF , per pt plate under foot   GOLD SEED IMPLANT N/A 02/18/2022   Procedure: GOLD SEED IMPLANT;  Surgeon: Remi Haggard, MD;  Location: [facility];  Service: Urology;  Laterality: N/A;  [place] Bilateral 2007   approx   SPACE OAR INSTILLATION N/A 02/18/2022   Procedure: SPACE OAR INSTILLATION;  Surgeon: Remi Haggard, MD;  Location: [facility];  Service: Urology;  Laterality: N/A;   HPI:  GLEB MCGUIRE is a 81 y.o. male  was recently discharged from the hospital on 11/23 after being evaluated for acute/subacute stroke of right corona radiata.  At that time, he had presented with right facial droop and slurred speech.  He was noted to have  carotid stenosis on carotid ultrasound.   He reports that during his hospital stay, he did have some mild sensation of dysphagia, although it appears he had passed his swallow screen and was not seen by speech therapy.  The patient was ambulating with his cane which she has chronically done due to right lower extremity arthritis and pain.  After returning home, he reported worsening of his dysphagia/dysarthria.  He noted that he had trouble maintaining his oral secretions causing him to drool out of the left side of his mouth.  Then he began noticing weakness in his left hand.  He was having trouble holding his cane and was dropping things from his left hand.  Due to development of new symptoms, he came to the ER for evaluation. New acute infarct in the right corona radiata, adjacent to the  previously noted acute infarcts on 05/07/2022.    Assessment / Plan / Recommendation  Clinical Impression  Pt demonstrates overt left CN V, XII weakness with additional potential for pharyngeal weakness. He is noted to have decreased labial seal on left and left lingual weakness. Pt has instances of anterior spillage with PO and unfortuantley also does not have his partial present (he usually masticates on the left side of his mouth without his denture because he has remaining teeth there). When given thin liquids  there is immediate coughing, likely delayed swallow and potential for residue as well. Pt tolerates small sips of nectar thick liquids and bites of puree without overt subjective difficulty. Will plan on MBS tomorrow for instrumental assessment; today pt can have sips of nectar thick liquids, pureed solids and pills crushed in puree. Recommend AIR at dc. Pt lives alone but has family next door that assists. SLP Visit Diagnosis: Dysphagia, oropharyngeal phase (R13.12)    Aspiration Risk  Moderate aspiration risk    Diet Recommendation     Liquid Administration via: Cup;Straw Medication Administration:  Crushed with puree Supervision: Patient able to self feed Compensations: Slow rate;Small sips/bites Postural Changes: Seated upright at 90 degrees    Other  Recommendations Oral Care Recommendations: Oral care BID    Recommendations for follow up therapy are one component of a multi-disciplinary discharge planning process, led by the attending physician.  Recommendations may be updated based on patient status, additional functional criteria and insurance authorization.  Follow up Recommendations Acute inpatient rehab (3hours/day)      Assistance Recommended at Discharge    Functional Status Assessment Patient has had a recent decline in their functional status and demonstrates the ability to make significant improvements in function in a reasonable and predictable amount of time.  Frequency and Duration min 2x/week  2 weeks       Prognosis Prognosis for Safe Diet Advancement: Good      Swallow Study   General HPI: CESARE SUMLIN is a 81 y.o. male  was recently discharged from the hospital on 11/23 after being evaluated for acute/subacute stroke of right corona radiata.  At that time, he had presented with right facial droop and slurred speech.  He was noted to have carotid stenosis on carotid ultrasound.   He reports that during his hospital stay, he did have some mild sensation of dysphagia, although it appears he had passed his swallow screen and was not seen by speech therapy.  The patient was ambulating with his cane which she has chronically done due to right lower extremity arthritis and pain.  After returning home, he reported worsening of his dysphagia/dysarthria.  He noted that he had trouble maintaining his oral secretions causing him to drool out of the left side of his mouth.  Then he began noticing weakness in his left hand.  He was having trouble holding his cane and was dropping things from his left hand.  Due to development of new symptoms, he came to the ER for evaluation.  New acute infarct in the right corona radiata, adjacent to the  previously noted acute infarcts on 05/07/2022. Type of Study: Bedside Swallow Evaluation Previous Swallow Assessment: none Diet Prior to this Study: NPO Temperature Spikes Noted: No Respiratory Status: Room air History of Recent Intubation: No Behavior/Cognition: Alert;Cooperative;Pleasant mood Oral Cavity Assessment: Within Functional Limits Oral Care Completed by SLP: No Oral Cavity - Dentition: Missing dentition;Dentures, not available Vision: Functional for self-feeding Self-Feeding Abilities: Able to feed self Patient Positioning: Upright in bed Baseline Vocal Quality: Suspected CN X (Vagus) involvement Volitional Cough: Strong Volitional Swallow: Able to elicit    Oral/Motor/Sensory Function Overall Oral Motor/Sensory Function: Moderate impairment Facial ROM: Reduced left;Suspected CN VII (facial) dysfunction Facial Symmetry: Abnormal symmetry left;Suspected CN VII (facial) dysfunction Facial Strength: Reduced left;Suspected CN VII (facial) dysfunction Facial Sensation: Within Functional Limits Lingual ROM: Reduced left;Suspected CN XII (hypoglossal) dysfunction Lingual Symmetry: Abnormal symmetry left;Suspected CN XII (hypoglossal) dysfunction Lingual Strength: Reduced;Suspected CN XII (hypoglossal) dysfunction Lingual Sensation:  Within Functional Limits Velum: Suspected CN X (Vagus) dysfunction   Ice Chips Ice chips: Impaired Presentation: Spoon Oral Phase Impairments: Reduced lingual movement/coordination Oral Phase Functional Implications: Oral residue;Prolonged oral transit;Left anterior spillage   Thin Liquid Thin Liquid: Impaired Presentation: Straw;Cup Oral Phase Impairments: Reduced labial seal Pharyngeal  Phase Impairments: Multiple swallows;Suspected delayed Swallow;Cough - Immediate    Nectar Thick Nectar Thick Liquid: Impaired Presentation: Straw Pharyngeal Phase Impairments: Suspected delayed  Swallow;Multiple swallows   Honey Thick Honey Thick Liquid: Not tested   Puree Puree: Impaired Presentation: Spoon Pharyngeal Phase Impairments: Multiple swallows   Solid     Solid: Not tested      Timoteo Carreiro, Katherene Ponto 05/11/2022,7:32 AM

## 2022-05-11 NOTE — Care Management Obs Status (Signed)
[place] NOTIFICATION   Patient Details  Name: Patrick Moss MRN: [id number] Date of Birth: 1940-08-03   Medicare Observation Status Notification Given:  Yes    Ki Corbo G., RN 05/11/2022, 9:14 AM

## 2022-05-11 NOTE — Progress Notes (Addendum)
STROKE TEAM PROGRESS NOTE   INTERVAL HISTORY His family is at the bedside.  He is sitting in the chair in NAD. He states still with left side weakness, dysarthria and drooling. Family states he is coughing while eating.    Vitals:   05/10/22 2042 05/11/22 0019 05/11/22 0453 05/11/22 0754  BP: (!) 128/57 110/60 132/63 (!) 149/77  Pulse: 64 65 77 62  Resp: '16 17 16 18  '$ Temp: 99.2 F (37.3 C) 98.3 F (36.8 C) 98.7 F (37.1 C) 98.4 F (36.9 C)  TempSrc: Oral Oral Oral Oral  SpO2: 98% 99% 98% 98%  Weight:      Height:       CBC:  Recent Labs  Lab 05/07/22 1258 05/10/22 0505 05/11/22 0332  WBC 6.8 6.1 5.1  NEUTROABS 5.3 4.7  --   HGB 12.6* 11.8* 11.1*  HCT 37.2* 35.2* 33.3*  MCV 93.2 93.4 94.3  PLT 261 239 458   Basic Metabolic Panel:  Recent Labs  Lab 05/10/22 0505 05/10/22 1022 05/11/22 0332  NA 140  --  143  K 3.6  --  3.6  CL 109  --  111  CO2 21*  --  22  GLUCOSE 119*  --  78  BUN 28*  --  17  CREATININE 1.47*  --  1.32*  CALCIUM 9.5  --  9.4  MG  --  2.2  --    Lipid Panel:  Recent Labs  Lab 05/08/22 0447  CHOL 92  TRIG 144  HDL 39*  CHOLHDL 2.4  VLDL 29  LDLCALC 24   HgbA1c:  Recent Labs  Lab 05/07/22 1258  HGBA1C 6.1*   Urine Drug Screen: No results for input(s): "LABOPIA", "COCAINSCRNUR", "LABBENZ", "AMPHETMU", "THCU", "LABBARB" in the last 168 hours.  Alcohol Level No results for input(s): "ETH" in the last 168 hours.  IMAGING past 24 hours MR BRAIN WO CONTRAST  Result Date: 05/11/2022 CLINICAL DATA:  Stroke suspected EXAM: MRI HEAD WITHOUT CONTRAST TECHNIQUE: Multiplanar, multiecho pulse sequences of the brain and surrounding structures were obtained without intravenous contrast. COMPARISON:  05/07/2022 MRI head FINDINGS: Brain: Restricted diffusion with ADC correlate in the right corona radiata, adjacent to the areas of restricted diffusion seen on the prior MRI. The new focus measures up to 13 x 16 mm in the axial plane (AP x TR)  (series 2, image 33). This is associated with increased T2 signal and favored to represent an acute infarct. No acute hemorrhage, mass, mass effect, or midline shift. No hydrocephalus or extra-axial collection. Scattered T2 hyperintense signal in the periventricular white matter, likely the sequela of mild chronic small vessel ischemic disease. Vascular: Normal arterial flow voids. Skull and upper cervical spine: Normal marrow signal. Sinuses/Orbits: Clear paranasal sinuses. Status post bilateral lens replacements. Other: The mastoids are well aerated. IMPRESSION: New acute infarct in the right corona radiata, adjacent to the previously noted acute infarcts on 05/07/2022. These results will be called to the ordering clinician or representative by the Radiologist Assistant, and communication documented in the PACS or [employer]. Electronically Signed   By: Merilyn Baba M.D.   On: 05/11/2022 02:47    PHYSICAL EXAM  Temp:  [98.3 F (36.8 C)-99.2 F (37.3 C)] 98.9 F (37.2 C) (11/26 1604) Pulse Rate:  [62-89] 68 (11/26 1604) Resp:  [13-18] 16 (11/26 1604) BP: (110-149)/(57-77) 126/63 (11/26 1604) SpO2:  [98 %-99 %] 98 % (11/26 1604)  General - Well nourished, well developed, in no apparent distress  Cardiovascular -  Regular rhythm and rate.  Mental Status -  Level of arousal and orientation to time, place, and person were intact.dysarthria, no aphasia Language including expression, naming, repetition, comprehension was assessed and found intact. Attention span and concentration were normal. Recent and remote memory were intact. Fund of Knowledge was assessed and was intact.  Cranial Nerves II - XII - II - Visual field intact OU. III, IV, VI - Extraocular movements intact. V - Facial sensation intact bilaterally. VII - Facial movement intact bilaterally. VIII - Hearing & vestibular intact bilaterally. X - Palate elevates symmetrically. XI - Chin turning & shoulder shrug intact  bilaterally. XII - Tongue protrusion intact.  Motor Strength - left arm 4/5, left leg 4/5 right arm 5/5, right leg 5/5. Left grip strength 3/5.  Motor Tone - Muscle tone was assessed at the neck and appendages and was normal.  Sensory - Light touch, temperature/pinprick were assessed and were symmetrical.    Coordination - mild ataxia left   Gait and Station - deferred.  ASSESSMENT/PLAN [name] is a 81 y.o. male with history of  hypertension, diabetes, chronic kidney disease stage IIIa, was recently discharged from the hospital on 11/23 after being evaluated for acute/subacute stroke of right corona radiata.  At that time, he had presented with right facial droop and slurred speech.  He was noted to have carotid stenosis on carotid ultrasound.  Seen by neurology with recommendations for outpatient follow-up with interventional radiology to be considered for stenting.  He was started on aspirin and Plavix. After returning home, he reported worsening of his dysphagia/dysarthria.  He noted that he had trouble maintaining his oral secretions causing him to drool out of the left side of his mouth.  Yesterday, he began noticing weakness in his left hand.  He was having trouble holding his cane and was dropping things from his left hand.  Due to development of new symptoms, he came to the ER for evaluation.   Stroke: Acute right corona radiata ischemic infarct  Etiology:  due to right ICA stenosis/atherosclerosis CT head  1. Focal area of low attenuation within the right corona radiata corresponding to areas of restricted diffusion on recent MRI and compatible with evolutionary changes of previously noted acute to early subacute infarcts noted on 05/07/2022. 2. Chronic small vessel ischemic disease and brain atrophy. CTA head & neck  1. Negative for large vessel occlusion. But Positive for complex plaque in the proximal Right ICA with a possible small focus of superimposed adherent  thrombus (series 8, image 141 and series 4, image 113). Hemodynamically significant stenosis there estimated at 60%. And positive also for bulky bilateral calcified plaque of the Dominant Right Vertebral V4 segment with up to Severe stenosis. This is distal to the Right PICA, and the Basilar artery is patent without stenosis. 2. Additionally, mild to moderate Left ICA siphon petrous and cavernous segment atherosclerotic stenosis. MRI   New acute infarct in the right corona radiata, adjacent to the previously noted acute infarcts on 05/07/2022 2D Echo 11/22: EF 60-65%. Left ventricular diastolic parameters are consistent with Grade I diastolic dysfunction. Left atrial size was mildly dilated.  Dr. Estanislado Pandy to see patient, plan for right ICA stenting  LDL 24 HgbA1c 6.1 VTE prophylaxis - Lovenox aspirin 325 mg daily and clopidogrel 75 mg daily prior to admission, now on aspirin 325 mg daily and clopidogrel 75 mg daily.  Further regimen per IR Therapy recommendations:  pending Disposition:  pending  Hypertension Home meds:  norvasc '5mg'$ , diovan 320-'25mg'$  Stable Permissive hypertension (OK if < 220/120) but gradually normalize in 5-7 days Long-term BP goal normotensive  Hyperlipidemia Home meds:  zetia and crestor '5mg'$  LDL 24, goal < 70 Continue Crestor '5mg'$  Continue statin at discharge  Diabetes type II Controlled Home meds:  insulin, metformin HgbA1c 6.1, goal < 7.0 CBGs SSI  Other Stroke Risk Factors Advanced Age >/= 41  Hx stroke/TIA Coronary artery disease  Other Active Problems BPH CKD   Hospital day # 0  Beulah Gandy DNP, ACNPC-AG  [facility] Pager# [phone]   ATTENDING NOTE: I reviewed above note and agree with the assessment and plan. Pt was seen and examined.   81 year old male with history of hypertension, hyperlipidemia, diabetes, CKD 3A initially admitted on 05/07/2022 at [facility] for right facial droop and slurred speech for 2 days.  MRI showed  right CR 2 punctate infarcts.  EF 60 to 65%.  MRI negative.  Carotid Doppler right ICA 50 to 69% stenosis.  Discharged on 05/08/2022 with aspirin 325, Plavix 75, Zetia 10 and Crestor 5.  Plan to follow-up with Dr. Norma Fredrickson IR to consider right ICA intervention.  Patient now readmitted for left hand weakness for 3 days.  CT no acute abnormality.  CTA head and neck showed right ICA complex plaque with 60% stenosis.  Right V4 severe stenosis.  Left ICA siphon atherosclerosis.  MRI showed right CR stroke extension from prior and right precentral gyrus small infarcts.  Creatinine 1.32-1.47.  On exam, patient sitting in chair, son is at bedside.  AOx3, still has left facial droop and left UE proximal pronator drift and distal decreased hand grip and dexterity.  Otherwise neuro intact.  Etiology for patient stroke likely due to right ICA high risk plaque with stenosis.  Discussed with patient regarding potential right ICA stenting, he is in agreement.  We will have Dr. Estanislado Pandy IR on call evaluation for potential right ICA stenting.  Continue aspirin 325, Plavix 75 and Crestor 5 at this time.  PT/OT recommend home health.  Will follow.  For detailed assessment and plan, please refer to above/below as I have made changes wherever appropriate.   Rosalin Hawking, MD PhD Stroke Neurology 05/11/2022 7:45 PM   To contact Stroke Continuity provider, please refer to [email or web address] After hours, contact General Neurology

## 2022-05-12 ENCOUNTER — Encounter (HOSPITAL_COMMUNITY): Payer: Self-pay | Admitting: Internal Medicine

## 2022-05-12 ENCOUNTER — Other Ambulatory Visit (HOSPITAL_COMMUNITY): Payer: Self-pay

## 2022-05-12 ENCOUNTER — Inpatient Hospital Stay (HOSPITAL_COMMUNITY): Payer: Medicare HMO

## 2022-05-12 DIAGNOSIS — I10 Essential (primary) hypertension: Secondary | ICD-10-CM | POA: Diagnosis not present

## 2022-05-12 DIAGNOSIS — I63411 Cerebral infarction due to embolism of right middle cerebral artery: Secondary | ICD-10-CM | POA: Diagnosis not present

## 2022-05-12 DIAGNOSIS — R1312 Dysphagia, oropharyngeal phase: Secondary | ICD-10-CM | POA: Diagnosis not present

## 2022-05-12 LAB — GLUCOSE, CAPILLARY
Glucose-Capillary: 113 mg/dL — ABNORMAL HIGH (ref 70–99)
Glucose-Capillary: 118 mg/dL — ABNORMAL HIGH (ref 70–99)
Glucose-Capillary: 118 mg/dL — ABNORMAL HIGH (ref 70–99)
Glucose-Capillary: 141 mg/dL — ABNORMAL HIGH (ref 70–99)
Glucose-Capillary: 174 mg/dL — ABNORMAL HIGH (ref 70–99)
Glucose-Capillary: 96 mg/dL (ref 70–99)

## 2022-05-12 LAB — BASIC METABOLIC PANEL
Anion gap: 11 (ref 5–15)
BUN: 23 mg/dL (ref 8–23)
CO2: 21 mmol/L — ABNORMAL LOW (ref 22–32)
Calcium: 9.6 mg/dL (ref 8.9–10.3)
Chloride: 109 mmol/L (ref 98–111)
Creatinine, Ser: 1.36 mg/dL — ABNORMAL HIGH (ref 0.61–1.24)
GFR, Estimated: 53 mL/min — ABNORMAL LOW (ref >60)
Glucose, Bld: 98 mg/dL (ref 70–99)
Potassium: 4.7 mmol/L (ref 3.5–5.1)
Sodium: 141 mmol/L (ref 135–145)

## 2022-05-12 LAB — CBC
HCT: 34.2 % — ABNORMAL LOW (ref 39.0–52.0)
Hemoglobin: 11.8 g/dL — ABNORMAL LOW (ref 13.0–17.0)
MCH: 31.9 pg (ref 26.0–34.0)
MCHC: 34.5 g/dL (ref 30.0–36.0)
MCV: 92.4 fL (ref 80.0–100.0)
Platelets: 188 10*3/uL (ref 150–400)
RBC: 3.7 MIL/uL — ABNORMAL LOW (ref 4.22–5.81)
RDW: 14.3 % (ref 11.5–15.5)
WBC: 5.4 10*3/uL (ref 4.0–10.5)
nRBC: 0 % (ref 0.0–0.2)

## 2022-05-12 MED ORDER — ASPIRIN 81 MG PO CHEW
81.0000 mg | CHEWABLE_TABLET | Freq: Every day | ORAL | Status: DC
  Administered 2022-05-12 – 2022-05-14 (×3): 81 mg via ORAL
  Filled 2022-05-12 (×3): qty 1

## 2022-05-12 MED ORDER — TICAGRELOR 90 MG PO TABS
180.0000 mg | ORAL_TABLET | Freq: Once | ORAL | Status: AC
  Administered 2022-05-12: 180 mg via ORAL
  Filled 2022-05-12: qty 2

## 2022-05-12 MED ORDER — TICAGRELOR 90 MG PO TABS
90.0000 mg | ORAL_TABLET | Freq: Two times a day (BID) | ORAL | Status: DC
  Administered 2022-05-12 – 2022-05-14 (×4): 90 mg via ORAL
  Filled 2022-05-12 (×4): qty 1

## 2022-05-12 NOTE — Consult Note (Addendum)
Chief Complaint: Patient was seen in consultation today for right ICA plaque  Chief Complaint  Patient presents with   Weakness   at the request of Rosalin Hawking   Referring Physician(s): Rosalin Hawking   Supervising Physician: Luanne Bras  Patient Status: [facility] - In-pt  History of Present Illness: Patrick Moss is a 81 y.o. male with PMHs of HTN, HLD, CKD stage II, BPH, prostates CA, DM type 2, who presented to the hospital on 11/21 secondary to right facial droop and slurred speech.   He was started on aspirin and Plavix and aspirin and discharged from the hospital on 11/23 after being evaluated for acute/subacute stroke. After returning home, he reported worsening of his dysphagia/dysarthria, had trouble maintaining his oral secretions causing him to drool out of the left side of his mouth, and subsequently he began noticing weakness in his left hand which prompted patient to go to ED again. CT/CTA head/neck showed:  1. Negative for large vessel occlusion.   But Positive for complex plaque in the proximal Right ICA with a possible small focus of superimposed adherent thrombus (series 8, image 141 and series 4, image 113). Hemodynamically significant stenosis there estimated at 60%.   And positive also for bulky bilateral calcified plaque of the Dominant Right Vertebral V4 segment with up to Severe stenosis. This is distal to the Right PICA, and the Basilar artery is patent without stenosis.   2. Additionally, mild to moderate Left ICA siphon petrous and cavernous segment atherosclerotic stenosis.  Patient was admitted for further eval and management, NIR was consulted for  cerebral angiogram and possible R ICA stent placement.  Case reviewed and approved by Dr. Estanislado Pandy.   Patient sitting in a chair, not in acute distress. Brother at bedside.  Denise headache, vision changes, left sided weakness, fever, chills, shortness of breath, cough, chest pain, abdominal pain,  nausea ,vomiting, and bleeding. Still has drool from left side of mouth.   Benefits and risks of cerebral angiogram and possible intervention was discussed with the patient and his brother in detail, after thorough discussion and shared decision making, patient decided to proceed.    Past Medical History:  Diagnosis Date   Borderline glaucoma of both eyes    BPH associated with nocturia    CKD (chronic kidney disease), stage II    Complication of anesthesia    per pt emergent hiccups   Hyperlipidemia, mixed    Hypertension    Malignant neoplasm prostate ([place]) 11/2021   primary urologist---  dr dahlstadt/  radiation oncology [facility];  dx 06/ 2023,  Gleason 4+3,  PSA  13.2   OA (osteoarthritis)    knees   Type 2 diabetes mellitus treated with insulin ([place])    followed by pcp ( previous seen by endocriologist--- dr g. nida ,lov note in epic 02-19-2021);    (02-11-2022  pt stated checks blood sugar twice daily,  fasting sugar average 90s  to 120s)   Wears dentures    upper   Wears glasses     Past Surgical History:  Procedure Laterality Date   FEMUR IM NAIL Right 05/03/2021   '@[facility]'$ --  W-S   FOOT FRACTURE SURGERY Right    2017;   ORIF , per pt plate under foot   GOLD SEED IMPLANT N/A 02/18/2022   Procedure: GOLD SEED IMPLANT;  Surgeon: Remi Haggard, MD;  Location: [facility];  Service: Urology;  Laterality: N/A;  30 MINS  INGUINAL HERNIA REPAIR Bilateral 2007   approx   SPACE OAR INSTILLATION N/A 02/18/2022   Procedure: SPACE OAR INSTILLATION;  Surgeon: Remi Haggard, MD;  Location: [facility];  Service: Urology;  Laterality: N/A;    Allergies: Septra [sulfamethoxazole-trimethoprim]  Medications: Prior to Admission medications   Medication Sig Start Date End Date Taking? Authorizing Provider  amLODipine (NORVASC) 5 MG tablet Take 1 tablet (5 mg total) by mouth daily. 04/07/22  Yes [name], MD  aspirin 325  MG tablet Take 1 tablet (325 mg total) by mouth daily. 05/09/22  Yes Barton Dubois, MD  calcium carbonate (OSCAL) 1500 (600 Ca) MG TABS tablet Take 1,500 mg by mouth 2 (two) times daily with a meal.   Yes [provider]  celecoxib (CELEBREX) 400 MG capsule Take 1 capsule (400 mg total) by mouth daily as needed for pain (severe pain). With food 05/08/22  Yes Barton Dubois, MD  Cholecalciferol (VITAMIN D3) 50 MCG (2000 UT) TABS Take 1 capsule by mouth daily.   Yes [provider]  clopidogrel (PLAVIX) 75 MG tablet Take 1 tablet (75 mg total) by mouth daily. 05/09/22  Yes Barton Dubois, MD  diclofenac Sodium (VOLTAREN) 1 % GEL Apply 4 g topically 4 (four) times daily as needed. For the knee 05/08/22  Yes Barton Dubois, MD  Dulaglutide (TRULICITY) 1.5 QB/3.4LP SOPN Inject 1.5 mg into the skin once a week. Patient taking differently: Inject 1.5 mg into the skin once a week. Saturday's 05/15/21  Yes [name], [name], MD  ezetimibe (ZETIA) 10 MG tablet Take 1 tablet (10 mg total) by mouth daily. 05/08/22 05/08/23 Yes Barton Dubois, MD  Insulin Glargine [facility]) 100 UNIT/ML Inject 30 Units into the skin daily. Patient taking differently: Inject 30 Units into the skin at bedtime. 05/15/21  Yes [name], [name], MD  metFORMIN (GLUCOPHAGE-XR) 500 MG 24 hr tablet TAKE 2 TABLETS EVERY DAY AFTER BREAKFAST Patient taking differently: 1,000 mg daily with breakfast. 01/13/22  Yes [name], [name], MD  omega-3 fish oil (MAXEPA) 1000 MG CAPS capsule Take 1 capsule by mouth daily. 05/06/21  Yes [provider]  pantoprazole (PROTONIX) 40 MG tablet Take 1 tablet (40 mg total) by mouth daily. 05/08/22 05/08/23 Yes Barton Dubois, MD  rosuvastatin (CRESTOR) 5 MG tablet Take 1 tablet (5 mg total) by mouth daily. 04/07/22  Yes [name], [name], MD  valsartan-hydrochlorothiazide (DIOVAN-HCT) 320-25 MG tablet Take 1 tablet by mouth daily. 04/07/22  Yes [name], [name], MD  B-D ULTRAFINE III  SHORT PEN 31G X 8 MM MISC USE  1  PEN  NEEDLE AS DIRECTED 01/14/17   Nida, Marella Chimes, MD  glucose blood (TRUE METRIX BLOOD GLUCOSE TEST) test strip TEST BLOOD SUGAR TWO TO THREE TIMES DAILY Dx E11.65 07/03/21   [name], MD     Family History  Problem Relation Age of Onset   Diabetes Mother    Diabetes Father    Diabetes Brother     Social History   Socioeconomic History   Marital status: Divorced    Spouse name: Not on file   Number of children: 2   Years of education: 10   Highest education level: 10th grade  Occupational History   Occupation: retired  Tobacco Use   Smoking status: Never   Smokeless tobacco: Never  Vaping Use   Vaping Use: Never used  Substance and Sexual Activity   Alcohol use: No    Alcohol/week: 0.0 standard drinks of alcohol   Drug use: Never  Sexual activity: Not on file  Other Topics Concern   Not on file  Social History Narrative   Lives alone - son lives next door   [place] friend on his street - they check on each other several times per day and spend time together   Social Determinants of Health   Financial Resource Strain: Low Risk  (10/03/2021)   Overall Financial Resource Strain (CARDIA)    Difficulty of Paying Living Expenses: Not hard at all  Food Insecurity: No Food Insecurity (05/07/2022)   Hunger Vital Sign    Worried About Running Out of Food in the Last Year: Never true    Ran Out of Food in the Last Year: Never true  Transportation Needs: No Transportation Needs (05/07/2022)   PRAPARE - Hydrologist (Medical): No    Lack of Transportation (Non-Medical): No  Physical Activity: Insufficiently Active (10/03/2021)   Exercise Vital Sign    Days of Exercise per Week: 7 days    Minutes of Exercise per Session: 20 min  Stress: No Stress Concern Present (10/03/2021)   [place]    Feeling of Stress : Not at all  Social Connections:  Moderately Integrated (10/03/2021)   Social Connection and Isolation Panel [NHANES]    Frequency of Communication with Friends and Family: More than three times a week    Frequency of Social Gatherings with Friends and Family: More than three times a week    Attends Religious Services: More than 4 times per year    Active Member of [employer] or Organizations: Yes    Attends Music therapist: More than 4 times per year    Marital Status: Divorced     Review of Systems: A 12 point ROS discussed and pertinent positives are indicated in the HPI above.  All other systems are negative.  Vital Signs: BP 123/63 (BP Location: Right Arm)   Pulse 82   Temp 98.9 F (37.2 C) (Oral)   Resp 19   Ht '5\' 6"'$  (1.676 m)   Wt 178 lb 9.2 oz (81 kg)   SpO2 99%   BMI 28.82 kg/m    Physical Exam Vitals reviewed.  Constitutional:      General: He is not in acute distress.    Appearance: Normal appearance. He is not ill-appearing.  HENT:     Head: Normocephalic.     Mouth/Throat:     Mouth: Mucous membranes are moist.     Pharynx: Oropharynx is clear.     Comments: Left facial droop  Cardiovascular:     Rate and Rhythm: Normal rate and regular rhythm.     Heart sounds: Normal heart sounds.  Pulmonary:     Effort: Pulmonary effort is normal.     Breath sounds: Normal breath sounds.  Abdominal:     General: Abdomen is flat. Bowel sounds are normal.     Palpations: Abdomen is soft.  Skin:    General: Skin is warm and dry.  Neurological:     Mental Status: He is alert.     Comments: Alert, awake, and oriented x 3 Speech and comprehension intact EOMs intact, no nystagmus or subjective diplopia. Visual fields intact  Left facial droop Tongue midline  Motor power intact all 4  No pronator drift.      MD Evaluation Airway: WNL Heart: WNL Abdomen: WNL Chest/ Lungs: WNL ASA  Classification: 3 Mallampati/Airway Score: Two  Imaging: MR BRAIN  WO CONTRAST  Result Date:  05/11/2022 CLINICAL DATA:  Stroke suspected EXAM: MRI HEAD WITHOUT CONTRAST TECHNIQUE: Multiplanar, multiecho pulse sequences of the brain and surrounding structures were obtained without intravenous contrast. COMPARISON:  05/07/2022 MRI head FINDINGS: Brain: Restricted diffusion with ADC correlate in the right corona radiata, adjacent to the areas of restricted diffusion seen on the prior MRI. The new focus measures up to 13 x 16 mm in the axial plane (AP x TR) (series 2, image 33). This is associated with increased T2 signal and favored to represent an acute infarct. No acute hemorrhage, mass, mass effect, or midline shift. No hydrocephalus or extra-axial collection. Scattered T2 hyperintense signal in the periventricular white matter, likely the sequela of mild chronic small vessel ischemic disease. Vascular: Normal arterial flow voids. Skull and upper cervical spine: Normal marrow signal. Sinuses/Orbits: Clear paranasal sinuses. Status post bilateral lens replacements. Other: The mastoids are well aerated. IMPRESSION: New acute infarct in the right corona radiata, adjacent to the previously noted acute infarcts on 05/07/2022. These results will be called to the ordering clinician or representative by the Radiologist Assistant, and communication documented in the PACS or [employer]. Electronically Signed   By: Merilyn Baba M.D.   On: 05/11/2022 02:47   CT ANGIO HEAD NECK W WO CM  Result Date: 05/10/2022 CLINICAL DATA:  81 year old male with weakness on the left side, difficulty swallowing. Recent small right corona radiata infarcts on MRI 05/07/2022. prostate cancer. EXAM: CT ANGIOGRAPHY HEAD AND NECK TECHNIQUE: Multidetector CT imaging of the head and neck was performed using the standard protocol during bolus administration of intravenous contrast. Multiplanar CT image reconstructions and MIPs were obtained to evaluate the vascular anatomy. Carotid stenosis measurements (when applicable) are  obtained utilizing NASCET criteria, using the distal internal carotid diameter as the denominator. RADIATION DOSE REDUCTION: This exam was performed according to the departmental dose-optimization program which includes automated exposure control, adjustment of the mA and/or kV according to patient size and/or use of iterative reconstruction technique. CONTRAST:  28m OMNIPAQUE IOHEXOL 350 MG/ML SOLN COMPARISON:  CT head this morning reported separately. Brain MRI and intracranial MRA 05/07/2022. FINDINGS: CTA NECK Skeleton: Advanced cervical spine degeneration. Advanced cervicothoracic junction facet degeneration. But no acute or suspicious osseous lesion identified. Upper chest: Visible central pulmonary arteries appear patent. Visible lungs are largely clear, minor atelectasis. No superior mediastinal lymphadenopathy. Other neck: Negative. Aortic arch: 3 vessel arch configuration.  No arch atherosclerosis. Right carotid system: Minimal right CCA plaque. Right ICA origin and bulb combined soft and calcified plaque with evidence of hemodynamically significant proximal right ICA stenosis on series 4, image 113, numerically estimated at 60 % with respect to the distal vessel. A small amount of adherent thrombus is possible as seen on that image. No additional plaque or stenosis to the skull base. Left carotid system: Minimal plaque and no stenosis. Vertebral arteries: Proximal right subclavian artery is normal. Calcified plaque at the right vertebral artery origin, but only mild origin stenosis. Mild additional V1 segment plaque and stenosis. Right vertebral artery appears somewhat dominant and is otherwise negative to the skull base. Proximal left subclavian artery is normal. Left vertebral artery origin is normal. There is mild left V1 plaque and tortuosity, mild associated stenosis. Mildly non dominant left vertebral is otherwise negative to the skull base. CTA HEAD Posterior circulation: Dominant right V4  segment. Bulky bilateral V4 segment calcified plaque on series 5, image 286. Mild stenosis on the left. But moderate to severe stenosis in  the dominant right V4 segment on series 5, image 279. Both PICA origins occurred early and are patent, proximal to this plaque. Vertebrobasilar junction remains patent without stenosis. Patent basilar artery, basilar tip, SCA and PCA origins. Posterior communicating arteries are diminutive or absent. Bilateral PCA branches are within normal limits. Anterior circulation: Both ICA siphons are patent. Moderate left ICA petrous soft plaque (series 10, image 123) with mild associated stenosis. Mild to moderate additional left ICA cavernous segment plaque and mild to moderate distal cavernous stenosis on series 5, image 21. Patent supraclinoid left ICA without stenosis. Normal left ophthalmic artery origin. Right ICA siphon mild calcified plaque without stenosis. Patent carotid termini. Patent MCA and ACA origins. Normal anterior communicating artery. Normal bilateral ACA branches except for tortuosity. Left MCA M1 segment and bifurcation are patent without stenosis. Left MCA branches are within normal limits. Right MCA M1 segment and trifurcation are patent without stenosis. Right MCA branches are within normal limits. Venous sinuses: Patent. Anatomic variants: Dominant right vertebral artery. Review of the MIP images confirms the above findings IMPRESSION: 1. Negative for large vessel occlusion. But Positive for complex plaque in the proximal Right ICA with a possible small focus of superimposed adherent thrombus (series 8, image 141 and series 4, image 113). Hemodynamically significant stenosis there estimated at 60%. And positive also for bulky bilateral calcified plaque of the Dominant Right Vertebral V4 segment with up to Severe stenosis. This is distal to the Right PICA, and the Basilar artery is patent without stenosis. 2. Additionally, mild to moderate Left ICA siphon petrous  and cavernous segment atherosclerotic stenosis. Salient findings discussed by telephone with Dr. Godfrey Pick on 05/10/2022 at 08:25 . Electronically Signed   By: Genevie Ann M.D.   On: 05/10/2022 08:28   CT Head Wo Contrast  Result Date: 05/10/2022 CLINICAL DATA:  Neuro deficit. Difficulty swallowing and weakness on left side. EXAM: CT HEAD WITHOUT CONTRAST TECHNIQUE: Contiguous axial images were obtained from the base of the skull through the vertex without intravenous contrast. RADIATION DOSE REDUCTION: This exam was performed according to the departmental dose-optimization program which includes automated exposure control, adjustment of the mA and/or kV according to patient size and/or use of iterative reconstruction technique. COMPARISON:  MR 05/07/2022 FINDINGS: Brain: There is a focal area of low attenuation within the right corona radiata, image 18/3 corresponding to areas of restricted diffusion on recent MRI and compatible with evolutionary changes of previously noted acute to early subacute infarcts noted on 05/07/2022. No signs of acute intracranial hemorrhage, mass or mass effect. No significant hydrocephalus. There is mild diffuse low-attenuation within the subcortical and periventricular white matter compatible with chronic microvascular disease. Prominence of sulci and ventricles compatible with brain atrophy. Vascular: No hyperdense vessel or unexpected calcification. Skull: Normal. Negative for fracture or focal lesion. Sinuses/Orbits: No acute finding. Other: None IMPRESSION: 1. Focal area of low attenuation within the right corona radiata corresponding to areas of restricted diffusion on recent MRI and compatible with evolutionary changes of previously noted acute to early subacute infarcts noted on 05/07/2022. 2. Chronic small vessel ischemic disease and brain atrophy. Electronically Signed   By: Kerby Moors M.D.   On: 05/10/2022 08:06   ECHOCARDIOGRAM COMPLETE  Result Date: 05/07/2022     ECHOCARDIOGRAM REPORT   Patient Name:   HAWTHORNE DAY Date of Exam: 05/07/2022 Medical Rec #:  [id number]       Height:       66.0 in Accession #:  [id number]      Weight:       177.0 lb Date of Birth:  1940-08-07       BSA:          1.899 m Patient Age:    76 years        BP:           107/68 mmHg Patient Gender: M               HR:           83 bpm. Exam Location:  [facility] Procedure: 2D Echo, Cardiac Doppler and Color Doppler Indications:    Stroke  History:        Patient has no prior history of Echocardiogram examinations.                 Stroke; Risk Factors:Hypertension, Diabetes and Dyslipidemia.  Sonographer:    Wenda Low Referring Phys: Goodridge ZAMMIT IMPRESSIONS  1. Left ventricular ejection fraction, by estimation, is 60 to 65%. The left ventricle has normal function. The left ventricle has no regional wall motion abnormalities. Left ventricular diastolic parameters are consistent with Grade I diastolic dysfunction (impaired relaxation).  2. RV-RA gradient 24 mmHg suggesting normal estimated RVSP presuming normal CVP. Right ventricular systolic function is normal. The right ventricular size is normal.  3. Left atrial size was mildly dilated.  4. The mitral valve is grossly normal. Trivial mitral valve regurgitation.  5. The aortic valve is tricuspid. Aortic valve regurgitation is not visualized. Aortic valve mean gradient measures 2.0 mmHg.  6. Unable to estimate CVP.  7. Hypermobile interatrial septum without obvious shunting. Consider saline contrast bubble study for further investigation. Comparison(s): No prior Echocardiogram. FINDINGS  Left Ventricle: Left ventricular ejection fraction, by estimation, is 60 to 65%. The left ventricle has normal function. The left ventricle has no regional wall motion abnormalities. The left ventricular internal cavity size was normal in size. There is  borderline left ventricular hypertrophy. Left ventricular diastolic parameters are consistent with  Grade I diastolic dysfunction (impaired relaxation). Right Ventricle: RV-RA gradient 24 mmHg suggesting normal estimated RVSP presuming normal CVP. The right ventricular size is normal. No increase in right ventricular wall thickness. Right ventricular systolic function is normal. Left Atrium: Left atrial size was mildly dilated. Right Atrium: Right atrial size was normal in size. Pericardium: There is no evidence of pericardial effusion. Presence of epicardial fat layer. Mitral Valve: The mitral valve is grossly normal. Trivial mitral valve regurgitation. MV peak gradient, 5.5 mmHg. The mean mitral valve gradient is 2.0 mmHg. Tricuspid Valve: The tricuspid valve is grossly normal. Tricuspid valve regurgitation is trivial. Aortic Valve: The aortic valve is tricuspid. There is mild aortic valve annular calcification. Aortic valve regurgitation is not visualized. Aortic valve mean gradient measures 2.0 mmHg. Aortic valve peak gradient measures 4.9 mmHg. Aortic valve area, by  VTI measures 3.11 cm. Pulmonic Valve: The pulmonic valve was grossly normal. Pulmonic valve regurgitation is trivial. Aorta: The aortic root is normal in size and structure. Venous: Unable to estimate CVP. The inferior vena cava was not well visualized. IAS/Shunts: There is redundancy of the interatrial septum. No atrial level shunt detected by color flow Doppler.  LEFT VENTRICLE PLAX 2D LVIDd:         4.90 cm   Diastology LVIDs:         3.30 cm   LV e' medial:    5.98 cm/s LV PW:  1.00 cm   LV E/e' medial:  9.5 LV IVS:        1.00 cm   LV e' lateral:   9.46 cm/s LVOT diam:     2.00 cm   LV E/e' lateral: 6.0 LV SV:         70 LV SV Index:   37 LVOT Area:     3.14 cm  RIGHT VENTRICLE RV Basal diam:  3.05 cm RV Mid diam:    2.70 cm RV S prime:     14.50 cm/s LEFT ATRIUM             Index        RIGHT ATRIUM           Index LA diam:        4.00 cm 2.11 cm/m   RA Area:     13.90 cm LA Vol (A2C):   64.8 ml 34.13 ml/m  RA Volume:   32.40 ml   17.06 ml/m LA Vol (A4C):   70.3 ml 37.03 ml/m LA Biplane Vol: 69.2 ml 36.45 ml/m  AORTIC VALVE                    PULMONIC VALVE AV Area (Vmax):    3.00 cm     PV Vmax:       1.02 m/s AV Area (Vmean):   3.12 cm     PV Peak grad:  4.2 mmHg AV Area (VTI):     3.11 cm AV Vmax:           111.00 cm/s AV Vmean:          69.900 cm/s AV VTI:            0.226 m AV Peak Grad:      4.9 mmHg AV Mean Grad:      2.0 mmHg LVOT Vmax:         106.00 cm/s LVOT Vmean:        69.500 cm/s LVOT VTI:          0.224 m LVOT/AV VTI ratio: 0.99  AORTA Ao Root diam: 3.60 cm MITRAL VALVE               TRICUSPID VALVE MV Area (PHT): 3.63 cm    TR Peak grad:   24.6 mmHg MV Area VTI:   2.52 cm    TR Vmax:        248.00 cm/s MV Peak grad:  5.5 mmHg MV Mean grad:  2.0 mmHg    SHUNTS MV Vmax:       1.17 m/s    Systemic VTI:  0.22 m MV Vmean:      56.3 cm/s   Systemic Diam: 2.00 cm MV Decel Time: 209 msec MV E velocity: 56.60 cm/s MV A velocity: 88.30 cm/s MV E/A ratio:  0.64 Rozann Lesches MD Electronically signed by Rozann Lesches MD Signature Date/Time: 05/07/2022/4:52:57 PM    Final    US Carotid Bilateral  Result Date: 05/07/2022 CLINICAL DATA:  81 year old male with history of stroke EXAM: BILATERAL CAROTID DUPLEX ULTRASOUND TECHNIQUE: Pearline Cables scale imaging, color Doppler and duplex ultrasound were performed of bilateral carotid and vertebral arteries in the neck. COMPARISON:  None Available. FINDINGS: Criteria: Quantification of carotid stenosis is based on velocity parameters that correlate the residual internal carotid diameter with NASCET-based stenosis levels, using the diameter of the distal internal carotid lumen as the denominator for stenosis measurement. The following velocity measurements were obtained:  RIGHT ICA:  Systolic 161 cm/sec, Diastolic 28 cm/sec CCA:  97 cm/sec SYSTOLIC ICA/CCA RATIO:  1.5 ECA:  143 cm/sec LEFT ICA:  Systolic 80 cm/sec, Diastolic 18 cm/sec CCA:  89 cm/sec SYSTOLIC ICA/CCA RATIO:  0.9 ECA:  127  cm/sec Right Brachial SBP: Not acquired Left Brachial SBP: Not acquired RIGHT CAROTID ARTERY: No significant calcifications of the right common carotid artery. Intermediate waveform maintained. Heterogeneous, irregular, and partially calcified plaque at the right carotid bifurcation. No significant lumen shadowing. Low resistance waveform of the right ICA. No significant tortuosity. RIGHT VERTEBRAL ARTERY: Antegrade flow with low resistance waveform. LEFT CAROTID ARTERY: No significant calcifications of the left common carotid artery. Intermediate waveform maintained. Heterogeneous and partially calcified plaque at the left carotid bifurcation without significant lumen shadowing. Low resistance waveform of the left ICA. No significant tortuosity. LEFT VERTEBRAL ARTERY:  Antegrade flow with low resistance waveform. IMPRESSION: Right: Heterogeneous and partially calcified plaque at the right carotid bifurcation contributes to 50%-69% stenosis by established duplex criteria. Left: Color duplex indicates minimal heterogeneous and calcified plaque, with no hemodynamically significant stenosis by duplex criteria in the extracranial cerebrovascular circulation. Signed, Dulcy Fanny. Nadene Rubins, RPVI [facility] Radiology Electronically Signed   By: Corrie Mckusick D.O.   On: 05/07/2022 15:54   MR BRAIN WO CONTRAST  Result Date: 05/07/2022 CLINICAL DATA:  Altered mental status EXAM: MRI HEAD WITHOUT CONTRAST MRA HEAD WITHOUT CONTRAST TECHNIQUE: Multiplanar, multi-echo pulse sequences of the brain and surrounding structures were acquired without intravenous contrast. Angiographic images of the Circle of Willis were acquired using MRA technique without intravenous contrast. COMPARISON:  None Available. FINDINGS: MRI HEAD FINDINGS Brain: There are 2 small foci of diffusion restriction with associated FLAIR signal abnormality in the right corona radiata consistent with acute  to early subacute infarcts. There is no associated hemorrhage or mass effect. There is no other evidence of acute infarct. There is no acute intracranial hemorrhage or extra-axial fluid collection There is mild background parenchymal volume loss with prominence of the ventricular system and extra-axial CSF spaces. There are scattered small foci of FLAIR signal abnormality in the remainder of the supratentorial white matter which are nonspecific but likely reflects sequela of mild chronic small-vessel ischemic change There is no mass lesion.  There is no mass effect or midline shift. Vascular: See below. Skull and upper cervical spine: Normal marrow signal. Sinuses/Orbits: The paranasal sinuses are clear. Bilateral lens implants are in place. The globes and orbits are otherwise unremarkable. Other: None. MRA HEAD FINDINGS Anterior circulation: The intracranial ICAs are patent, without significant atherosclerotic irregularity or significant stenosis. The bilateral MCAs are patent, without proximal stenosis or occlusion. The bilateral ACAs are patent, without proximal stenosis or occlusion. The anterior communicating artery is normal. There is no aneurysm or AVM. Posterior circulation: The bilateral V4 segments are patent. The basilar artery is patent. The major cerebellar arteries appear patent. The bilateral PCAs are patent, without proximal stenosis or occlusion. There is no aneurysm or AVM. Anatomic variants: None. IMPRESSION: 1. Two small acute to early subacute infarcts in the right corona radiata. 2. Background parenchymal volume loss and mild chronic small-vessel ischemic change. 3.  Normal intracranial vasculature. Electronically Signed   By: Valetta Mole M.D.   On: 05/07/2022 14:03   MR ANGIO HEAD WO CONTRAST  Result Date: 05/07/2022 CLINICAL DATA:  Altered mental status EXAM: MRI HEAD WITHOUT CONTRAST MRA HEAD WITHOUT CONTRAST TECHNIQUE: Multiplanar, multi-echo pulse sequences of the brain and  surrounding structures were acquired without intravenous contrast. Angiographic images of the Circle of Willis were acquired using MRA technique without intravenous contrast. COMPARISON:  None Available. FINDINGS: MRI HEAD FINDINGS Brain: There are 2 small foci of diffusion restriction with associated FLAIR signal abnormality in the right corona radiata consistent with acute to early subacute infarcts. There is no associated hemorrhage or mass effect. There is no other evidence of acute infarct. There is no acute intracranial hemorrhage or extra-axial fluid collection There is mild background parenchymal volume loss with prominence of the ventricular system and extra-axial CSF spaces. There are scattered small foci of FLAIR signal abnormality in the remainder of the supratentorial white matter which are nonspecific but likely reflects sequela of mild chronic small-vessel ischemic change There is no mass lesion.  There is no mass effect or midline shift. Vascular: See below. Skull and upper cervical spine: Normal marrow signal. Sinuses/Orbits: The paranasal sinuses are clear. Bilateral lens implants are in place. The globes and orbits are otherwise unremarkable. Other: None. MRA HEAD FINDINGS Anterior circulation: The intracranial ICAs are patent, without significant atherosclerotic irregularity or significant stenosis. The bilateral MCAs are patent, without proximal stenosis or occlusion. The bilateral ACAs are patent, without proximal stenosis or occlusion. The anterior communicating artery is normal. There is no aneurysm or AVM. Posterior circulation: The bilateral V4 segments are patent. The basilar artery is patent. The major cerebellar arteries appear patent. The bilateral PCAs are patent, without proximal stenosis or occlusion. There is no aneurysm or AVM. Anatomic variants: None. IMPRESSION: 1. Two small acute to early subacute infarcts in the right corona radiata. 2. Background parenchymal volume loss and  mild chronic small-vessel ischemic change. 3.  Normal intracranial vasculature. Electronically Signed   By: Valetta Mole M.D.   On: 05/07/2022 14:03    Labs:  CBC: Recent Labs    05/07/22 1258 05/10/22 0505 05/11/22 0332 05/12/22 0406  WBC 6.8 6.1 5.1 5.4  HGB 12.6* 11.8* 11.1* 11.8*  HCT 37.2* 35.2* 33.3* 34.2*  PLT 261 239 220 188    COAGS: Recent Labs    05/10/22 0505  INR 1.0    BMP: Recent Labs    05/08/22 0447 05/10/22 0505 05/11/22 0332 05/12/22 0406  NA 141 140 143 141  K 4.0 3.6 3.6 4.7  CL 111 109 111 109  CO2 25 21* 22 21*  GLUCOSE 91 119* 78 98  BUN 22 28* 17 23  CALCIUM 9.3 9.5 9.4 9.6  CREATININE 1.38* 1.47* 1.32* 1.36*  GFRNONAA 52* 48* 55* 53*    LIVER FUNCTION TESTS: Recent Labs    08/27/21 1321 01/09/22 0933 05/07/22 1258 05/10/22 0505  BILITOT 0.5 0.5 0.8 0.7  AST '17 15 24 24  '$ ALT 17 19 32 30  ALKPHOS 104 94 73 60  PROT 7.0 7.0 7.6 6.9  ALBUMIN 4.6 4.6 4.1 3.9    TUMOR MARKERS: No results for input(s): "AFPTM", "CEA", "CA199", "CHROMGRNA" in the last 8760 hours.  Assessment and Plan: 81 y.o. male with recent hx of CVA x 2 presents with plaque in R ICA who is in need of cerebral angiogram and possible intervention including stent placement.   Risks and benefits of cerebral angiogram with intervention were discussed with the patient including, but not limited to bleeding, infection, vascular injury, contrast induced renal failure, stroke or even death.  This interventional procedure involves the use of X-rays and because of the nature of the planned procedure, it is possible that we will have prolonged use  of X-ray fluoroscopy.  Potential radiation risks to you include (but are not limited to) the following: - A slightly elevated risk for cancer  several years later in life. This risk is typically less than 0.5% percent. This risk is low in comparison to the normal incidence of human cancer, which is 33% for women and 50% for men  according to the [place]. - Radiation induced injury can include skin redness, resembling a rash, tissue breakdown / ulcers and hair loss (which can be temporary or permanent).   The likelihood of either of these occurring depends on the difficulty of the procedure and whether you are sensitive to radiation due to previous procedures, disease, or genetic conditions.   IF your procedure requires a prolonged use of radiation, you will be notified and given written instructions for further action.  It is your responsibility to monitor the irradiated area for the 2 weeks following the procedure and to notify your physician if you are concerned that you have suffered a radiation induced injury.    All of the patient's questions were answered, patient is agreeable to proceed.  Consent signed and in chart.  The procedure is scheduled for Wednesday 11:30 with anesthesia team.   PLAN  NPO Wed MN CBC, INR on Tue AM, BMP on Wed AM  P2Y12 pending  Loading dose Brilinta 180 mg given this morning, cont Brilinta 90 mg BID and ASA 81 mg.  Pt with CKD stage II, Dr. Estanislado Pandy notified   Thank you for this interesting consult.  I greatly enjoyed meeting Patrick Moss and look forward to participating in their care.  A copy of this report was sent to the requesting provider on this date.  Electronically Signed: Tera Mater, PA-C 05/12/2022, 4:21 PM   I spent a total of 40 Minutes    in face to face in clinical consultation, greater than 50% of which was counseling/coordinating care for recent CVA due to R ICA plaque.   This chart was dictated using voice recognition software.  Despite best efforts to proofread,  errors can occur which can change the documentation meaning.

## 2022-05-12 NOTE — Progress Notes (Addendum)
STROKE TEAM PROGRESS NOTE   INTERVAL HISTORY His family is at the bedside.  Just returned back to his room from [facility] test.  He states he is doing [place], still with some left side weakness and trouble with his saliva but not as bad as yesterday.  Plan for cerebral angiogram on Wednesday.  Neurological exam unchanged. No new neurological events overnight.    Vitals:   05/12/22 0400 05/12/22 0746 05/12/22 1148 05/12/22 1641  BP: 131/74 135/62 123/63 133/70  Pulse: 63 69 82 84  Resp: '20 18 19 18  '$ Temp: 98.9 F (37.2 C) 98 F (36.7 C) 98.9 F (37.2 C) 98.4 F (36.9 C)  TempSrc:  Oral Oral Oral  SpO2: 100% 99% 99% 100%  Weight:      Height:       CBC:  Recent Labs  Lab 05/07/22 1258 05/10/22 0505 05/11/22 0332 05/12/22 0406  WBC 6.8 6.1 5.1 5.4  NEUTROABS 5.3 4.7  --   --   HGB 12.6* 11.8* 11.1* 11.8*  HCT 37.2* 35.2* 33.3* 34.2*  MCV 93.2 93.4 94.3 92.4  PLT 261 239 220 657    Basic Metabolic Panel:  Recent Labs  Lab 05/10/22 1022 05/11/22 0332 05/12/22 0406  NA  --  143 141  K  --  3.6 4.7  CL  --  111 109  CO2  --  22 21*  GLUCOSE  --  78 98  BUN  --  17 23  CREATININE  --  1.32* 1.36*  CALCIUM  --  9.4 9.6  MG 2.2  --   --     Lipid Panel:  Recent Labs  Lab 05/08/22 0447  CHOL 92  TRIG 144  HDL 39*  CHOLHDL 2.4  VLDL 29  LDLCALC 24    HgbA1c:  Recent Labs  Lab 05/07/22 1258  HGBA1C 6.1*    Urine Drug Screen: No results for input(s): "LABOPIA", "COCAINSCRNUR", "LABBENZ", "AMPHETMU", "THCU", "LABBARB" in the last 168 hours.  Alcohol Level No results for input(s): "ETH" in the last 168 hours.  IMAGING past 24 hours No results found.  PHYSICAL EXAM  Temp:  [98 F (36.7 C)-98.9 F (37.2 C)] 98.4 F (36.9 C) (11/27 1641) Pulse Rate:  [63-84] 84 (11/27 1641) Resp:  [18-20] 18 (11/27 1641) BP: (123-142)/(62-74) 133/70 (11/27 1641) SpO2:  [98 %-100 %] 100 % (11/27 1641)  General - Well nourished, well developed, in no apparent  distress  Cardiovascular - Regular rhythm and rate.  Mental Status -  Level of arousal and orientation to time, place, and person were intact.dysarthria, no aphasia Language including expression, naming, repetition, comprehension was assessed and found intact. Attention span and concentration were normal. Recent and remote memory were intact. Fund of Knowledge was assessed and was intact.  Cranial Nerves II - XII - II - Visual field intact OU. III, IV, VI - Extraocular movements intact. V - Facial sensation intact bilaterally. VII - Facial movement intact bilaterally. VIII - Hearing & vestibular intact bilaterally. X - Palate elevates symmetrically. XI - Chin turning & shoulder shrug intact bilaterally. XII - Tongue protrusion intact.  Motor Strength - left arm 4/5, left leg 4/5 right arm 5/5, right leg 5/5. Left grip strength 3/5.  Motor Tone - Muscle tone was assessed at the neck and appendages and was normal.  Sensory - Light touch, temperature/pinprick were assessed and were symmetrical.    Coordination - mild ataxia left   Gait and Station - deferred.  ASSESSMENT/PLAN Mr.  Patrick Moss is a 81 y.o. male with history of  hypertension, diabetes, chronic kidney disease stage IIIa, was recently discharged from the hospital on 11/23 after being evaluated for acute/subacute stroke of right corona radiata.  At that time, he had presented with right facial droop and slurred speech.  He was noted to have carotid stenosis on carotid ultrasound.  Seen by neurology with recommendations for outpatient follow-up with interventional radiology to be considered for stenting.  He was started on aspirin and Plavix. After returning home, he reported worsening of his dysphagia/dysarthria.  He noted that he had trouble maintaining his oral secretions causing him to drool out of the left side of his mouth.  Yesterday, he began noticing weakness in his left hand.  He was having trouble holding his  cane and was dropping things from his left hand.  Due to development of new symptoms, he came to the ER for evaluation.   Stroke: Acute right corona radiata ischemic infarct  Etiology:  due to right ICA stenosis/atherosclerosis CT head  1. Focal area of low attenuation within the right corona radiata corresponding to areas of restricted diffusion on recent MRI and compatible with evolutionary changes of previously noted acute to early subacute infarcts noted on 05/07/2022. 2. Chronic small vessel ischemic disease and brain atrophy. CTA head & neck  1. Negative for large vessel occlusion. But Positive for complex plaque in the proximal Right ICA with a possible small focus of superimposed adherent thrombus (series 8, image 141 and series 4, image 113). Hemodynamically significant stenosis there estimated at 60%. And positive also for bulky bilateral calcified plaque of the Dominant Right Vertebral V4 segment with up to Severe stenosis. This is distal to the Right PICA, and the Basilar artery is patent without stenosis. 2. Additionally, mild to moderate Left ICA siphon petrous and cavernous segment atherosclerotic stenosis. MRI   New acute infarct in the right corona radiata, adjacent to the previously noted acute infarcts on 05/07/2022 2D Echo 11/22: EF 60-65%. Left ventricular diastolic parameters are consistent with Grade I diastolic dysfunction. Left atrial size was mildly dilated.  Dr. Estanislado Pandy  plan for cerebral angiogram and potential right ICA stenting scheduled for Wednesday.  LDL 24 HgbA1c 6.1 VTE prophylaxis - Lovenox aspirin 325 mg daily and clopidogrel 75 mg daily prior to admission, now on aspirin 325 mg daily and Brilinta (ticagrelor) 90 mg bid per IR  Therapy recommendations:  HH PT/OT  Disposition:  pending  Hypertension Home meds:  norvasc '5mg'$ , diovan 320-'25mg'$  Stable Avoid low BP Long-term BP goal normotensive  Hyperlipidemia Home meds:  zetia and crestor '5mg'$  LDL 24,  goal < 70 Continue Crestor '5mg'$  Continue statin at discharge  Diabetes type II Controlled Home meds:  insulin, metformin HgbA1c 6.1, goal < 7.0 CBGs SSI  Other Stroke Risk Factors Advanced Age >/= 40  Hx stroke/TIA Coronary artery disease  Other Active Problems BPH CKD3, creatinine 1.35-1.38-1.47-1.36  Hospital day # 1  Beulah Gandy DNP, ACNPC-AG  [facility] Pager# [phone]   ATTENDING NOTE: I reviewed above note and agree with the assessment and plan.    No acute events overnight, neuro stable.  IR consulted, Dr. Estanislado Pandy on board, plan for right ICA stenting Wednesday.  Now on aspirin 81 and Brilinta 90 twice daily.  Continue statin.  PT/OT recommend home health.  Will follow  For detailed assessment and plan, please refer to above/below as I have made changes wherever appropriate.   Rosalin Hawking, MD PhD Stroke Neurology  05/12/2022 9:56 PM    To contact Stroke Continuity provider, please refer to [email or web address] After hours, contact General Neurology

## 2022-05-12 NOTE — Plan of Care (Signed)

## 2022-05-12 NOTE — Telephone Encounter (Signed)
Pt enrolled in [employer]. Order placed.

## 2022-05-12 NOTE — Progress Notes (Signed)
Occupational Therapy Treatment Patient Details Name: Patrick Moss MRN: [id number] DOB: 1941-04-27 Today's Date: 05/12/2022   History of present illness 81 y.o. male who presents 05/10/22 with left hand weakness x 3 days. NIHSS=4; MRI  acute infarct in the right corona radiata, adjacent to the  previously noted acute infarcts on 05/07/2022.   PMH significant for BPH, CKD 3A, hyperlipidemia, hypertension, osteoarthritis, diabetes type 2 on insulin, prostate malignancy   OT comments  Pt progressing towards OT goals this session. Pt eager to work with therapy. He is excited because his Left hand is demonstrating improved strength and coordination - as evidenced by his participation in sink level ADL with min guard/supervision level assist. He was provided education and handouts for fine motor coordination and theraputty (bright pink medium level). Not only did he perform these HEPs, he was able to perform teach back for home use. OT will continue to follow acutely.    Recommendations for follow up therapy are one component of a multi-disciplinary discharge planning process, led by the attending physician.  Recommendations may be updated based on patient status, additional functional criteria and insurance authorization.    Follow Up Recommendations  Home health OT     Assistance Recommended at Discharge Intermittent Supervision/Assistance  Patient can return home with the following  A little help with walking and/or transfers;A little help with bathing/dressing/bathroom;Help with stairs or ramp for entrance;Assistance with cooking/housework;Direct supervision/assist for medications management;Direct supervision/assist for financial management;Assist for transportation;Assistance with feeding   Equipment Recommendations  None recommended by OT    Recommendations for Other Services      Precautions / Restrictions Precautions Precautions: Fall Restrictions Weight Bearing Restrictions: No        Mobility Bed Mobility               General bed mobility comments: OOB in recliner at beginning and walking with PT in hallway at end of session    Transfers Overall transfer level: Needs assistance Equipment used: Straight cane Transfers: Sit to/from Stand Sit to Stand: Min guard           General transfer comment: min guard for safety only     Balance Overall balance assessment: Needs assistance Sitting-balance support: No upper extremity supported, Feet supported Sitting balance-Leahy Scale: Good     Standing balance support: Single extremity supported Standing balance-Leahy Scale: Fair Standing balance comment: can static stand for grooming at sink without UE support                           ADL either performed or assessed with clinical judgement   ADL Overall ADL's : Needs assistance/impaired     Grooming: Oral care;Standing;Wash/dry face;Supervision/safety Grooming Details (indicate cue type and reason): pt able to open toothpaste, squeeze, brush teeth. drooling still present Upper Body Bathing: Min guard;Standing Upper Body Bathing Details (indicate cue type and reason): to wash arm piuts             Toilet Transfer: Min guard;Ambulation (cane)   Toileting- Clothing Manipulation and Hygiene: Min guard;Sit to/from stand       Functional mobility during ADLs: Min guard;Cane General ADL Comments: LUE functionally improving for ADL tasks    Extremity/Trunk Assessment Upper Extremity Assessment Upper Extremity Assessment: LUE deficits/detail LUE Deficits / Details: decresased dexterity, finger opposition, strength 4/5. Using functionally for cane use, improving in hand manipulation skills LUE Coordination: decreased fine motor;decreased gross motor  Vision   Vision Assessment?: No apparent visual deficits   Perception     Praxis      Cognition Arousal/Alertness: Awake/alert Behavior During Therapy: WFL  for tasks assessed/performed Overall Cognitive Status: Impaired/Different from baseline Area of Impairment: Memory, Safety/judgement, Awareness                     Memory: Decreased short-term memory   Safety/Judgement: Decreased awareness of deficits Awareness: Emergent   General Comments: Pt improved with cognition today - not back to baseline PTA, but good problem solving for functional tasks.        Exercises Exercises: Other exercises Other Exercises Other Exercises: fine motor coordination handout provided and practiced Other Exercises: theraputty handout provided along with bright pink (med level) putty    Shoulder Instructions       General Comments VSS, brother present for session    Pertinent Vitals/ Pain       Pain Assessment Pain Assessment: No/denies pain Pain Intervention(s): Monitored during session  Home Living                                          Prior Functioning/Environment              Frequency  Min 2X/week        Progress Toward Goals  OT Goals(current goals can now be found in the care plan section)  Progress towards OT goals: Progressing toward goals  Acute Rehab OT Goals Patient Stated Goal: keep improving L hand OT Goal Formulation: With patient Time For Goal Achievement: 05/25/22 Potential to Achieve Goals: Good  Plan Discharge plan remains appropriate    Co-evaluation                 AM-PAC OT "6 Clicks" Daily Activity     Outcome Measure   Help from another person eating meals?: A Little Help from another person taking care of personal grooming?: A Little Help from another person toileting, which includes using toliet, bedpan, or urinal?: A Little Help from another person bathing (including washing, rinsing, drying)?: A Little Help from another person to put on and taking off regular upper body clothing?: A Little Help from another person to put on and taking off regular lower body  clothing?: A Little 6 Click Score: 18    End of Session Equipment Utilized During Treatment: Gait belt (cane)  OT Visit Diagnosis: Unsteadiness on feet (R26.81);Muscle weakness (generalized) (M62.81);Other abnormalities of gait and mobility (R26.89);Other symptoms and signs involving cognitive function   Activity Tolerance Patient tolerated treatment well   Patient Left Other (comment) (in hallway with PT)   Nurse Communication Mobility status        Time: 1443-1540 OT Time Calculation (min): 24 min  Charges: OT General Charges $OT Visit: 1 Visit OT Treatments $Self Care/Home Management : 8-22 mins $Therapeutic Exercise: 8-22 mins  Jesse Sans OTR/L Acute Rehabilitation Services Office: [place] 05/12/2022, 12:37 PM

## 2022-05-12 NOTE — TOC Initial Note (Signed)
Transition of Care [facility]) - Initial/Assessment Note    Patient Details  Name: Patrick Moss MRN: [id number] Date of Birth: [birth year]  Transition of Care [facility]) CM/SW Contact:    Pollie Friar, RN Phone Number: 05/12/2022, 3:22 PM  Clinical Narrative:                 Pt is from home alone. His brother and son live close and can check on him frequently.  Pt has a cane at home.  Pt drives self as needed but brother can provide transportation. Pt manages his own medications.  Current recommendations are for [facility] services. Pt has used [facility] in the past and would like to use them again. Awaiting therapy evals post surgery.  TOC following.  Expected Discharge Plan: [place] Barriers to Discharge: Continued Medical Work up   Patient Goals and CMS Choice   CMS Medicare.gov Compare Post Acute Care list provided to:: Patient Choice offered to / list presented to : Patient  Expected Discharge Plan and Services Expected Discharge Plan: [place]   Discharge Planning Services: CM Consult Post Acute Care Choice: [place] arrangements for the past 2 months: Single Family Home                           HH Arranged: PT, OT   Date HH Agency Contacted: 05/12/22   Representative spoke with at [place]: [place]  Prior Living Arrangements/Services Living arrangements for the past 2 months: [place] with:: Self Patient language and need for interpreter reviewed:: Yes Do you feel safe going back to the place where you live?: Yes            Criminal Activity/Legal Involvement Pertinent to Current Situation/Hospitalization: No - Comment as needed  Activities of Daily Living Home Assistive Devices/Equipment: Cane (specify quad or straight) ADL Screening (condition at time of admission) Patient's cognitive ability adequate to safely complete daily activities?: Yes Is the patient deaf or have difficulty hearing?:  No Does the patient have difficulty seeing, even when wearing glasses/contacts?: No Does the patient have difficulty concentrating, remembering, or making decisions?: No Patient able to express need for assistance with ADLs?: Yes Does the patient have difficulty dressing or bathing?: No Independently performs ADLs?: Yes (appropriate for developmental age) Does the patient have difficulty walking or climbing stairs?: Yes Weakness of Legs: Both Weakness of Arms/Hands: None  Permission Sought/Granted                  Emotional Assessment Appearance:: Appears stated age Attitude/Demeanor/Rapport: Engaged Affect (typically observed): Accepting Orientation: : Oriented to Self, Oriented to Place, Oriented to Situation, Oriented to  Time   Psych Involvement: No (comment)  Admission diagnosis:  Stroke (cerebrum) ([place]) [I63.9] Acute CVA (cerebrovascular accident) [facility]) [I63.9] Patient Active Problem List   Diagnosis Date Noted   Acute CVA (cerebrovascular accident) ([place]) 05/11/2022   Carotid stenosis, right 05/10/2022   Dysphagia 05/10/2022   Stage 3a chronic kidney disease (CKD) ([place]) 05/10/2022   Stroke (cerebrum) ([place]) 05/10/2022   Acute ischemic stroke ([place]) 05/07/2022   GERD (gastroesophageal reflux disease) 05/07/2022   OA (osteoarthritis) 05/07/2022   At high risk for falls 09/04/2021   Senile osteoporosis 09/04/2021   Closed fracture of right hip ([place]) 05/02/2021   Uncontrolled type 2 diabetes mellitus with hyperglycemia ([place]) 04/07/2019   Intractable hiccups 09/10/2018   Mixed hyperlipidemia 01/19/2017   Class  1 obesity due to excess calories without serious comorbidity with body mass index (BMI) of 32.0 to 32.9 in adult 10/14/2016   Hypertension associated with diabetes ([place]) 03/12/2016   Uncontrolled type 2 diabetes mellitus without complication, without long-term current use of insulin 05/16/2015   Essential hypertension, benign 05/16/2015   Tinea 11/02/2013   Type  2 diabetes with nephropathy ([place]) 11/02/2013   PCP:  [name], MD Pharmacy:   [facility] [place] [place] [address] Phone: [phone] Fax: [phone]  [facility] [place], [place] - [address] [place] [address] Phone: [phone] Fax: [phone]     Social Determinants of Health ([place]) Interventions    Readmission Risk Interventions     No data to display

## 2022-05-12 NOTE — Progress Notes (Signed)
  Inpatient Rehab Admissions Coordinator :  Per MD request, patient was screened for CIR candidacy by Danne Baxter RN MSN. Patient is not at a level to require a CIR admit. PT and OT recommending Home with [facility]. If home health not feasible, recommend SNF level rehab. Please contact me with any questions.  Danne Baxter RN MSN Admissions Coordinator [phone]

## 2022-05-12 NOTE — Progress Notes (Signed)
[facility] PROGRESS NOTE   Patrick Moss JKK:[id number] DOB: [birth year] DOA: 05/10/2022  PCP: [name], MD  Brief History/Interval Summary: 81 y.o. male with medical history significant for hypertension, diabetes, chronic kidney disease stage IIIa, was recently discharged from the hospital on 11/23 after being evaluated for acute/subacute stroke of right corona radiata.  At that time, he had presented with facial droop and slurred speech.  He was noted to have carotid stenosis on carotid ultrasound.  Seen by neurology with recommendations for outpatient follow-up with interventional radiology to be considered for stenting.  He was started on aspirin and Plavix. After returning home, he reported worsening of his dysphagia/dysarthria.  He noted that he had trouble maintaining his oral secretions causing him to drool out of the left side of his mouth.  Subsequently, he began noticing weakness in his left hand.  He was having trouble holding his cane and was dropping things from his left hand.  Due to development of new symptoms, he came to the ER for evaluation.  Patient was transferred over to [facility] for further evaluation. A new stroke was identified on MRI.  Consultants: Neurology.  Neurointerventional radiology.  Procedures: None    Subjective/Interval History: Patient mentioned that his speech is slightly better but still slurred.  Left arm weakness is also slightly better.  Patient's brother is at the bedside.     Assessment/Plan:  Acute stroke Patient has had recurrent strokes.  Recently hospitalized for same.  Has a right RCA stenosis of 60%. Presented with left hand weakness and difficulty speaking and difficulty swallowing. MRI shows new stroke. Patient transferred to [facility].  Seen by stroke service.  Neurointerventional radiology has been consulted by them to consider stenting of the right ICA.   Patient was on aspirin and Plavix.  Has been  changed over to aspirin and Brilinta.  Noted to be on rosuvastatin and Zetia. LDL was noted to be 24 when checked recently.  HbA1c 6.1. Echocardiogram showed normal systolic function of the left ventricle with grade 1 diastolic dysfunction. Seen by speech therapy.  Patient is on dysphagia 1 diet.  Speech therapy recommends inpatient rehabilitation. Seen by PT and OT who recommends home health.  Essential hypertension Permissive hypertension allowed.  Blood pressures are reasonably well-controlled.  Holding valsartan hydrochlorothiazide and amlodipine.  Chronic kidney disease stage IIIa Seems to be at baseline.  Monitor urine output.  Avoid nephrotoxic agents.  Diabetes mellitus type 2, controlled Holding all home medications including metformin Trulicity basal insulin.  Monitor CBGs.  Continue SSI.  Oropharyngeal dysphagia Speech therapy is following.  GERD Continue PPI.  Normocytic anemia No evidence of overt bleeding.  Continue to monitor.  DVT Prophylaxis: Lovenox Code Status: Full code Family Communication: Discussed with patient Disposition Plan: To be determined  Status is: Inpatient Remains inpatient appropriate because: Recurrent stroke, right ICA stenosis     Medications: Scheduled:  aspirin  81 mg Oral Daily   enoxaparin (LOVENOX) injection  40 mg Subcutaneous Q24H   insulin aspart  0-15 Units Subcutaneous Q4H   pantoprazole  40 mg Oral Daily   rosuvastatin  5 mg Oral Daily   ticagrelor  90 mg Oral BID   Continuous:   [id number] **OR** acetaminophen (TYLENOL) oral liquid 160 mg/5 mL **OR** acetaminophen, senna-docusate  Antibiotics: Anti-infectives (From admission, onward)    None       Objective:  Vital Signs  Vitals:   05/11/22 1934 05/11/22 2332 05/12/22 0400 05/12/22 0746  BP:  138/64 (!) 142/63 131/74 135/62  Pulse: 67 64 63 69  Resp: '18 20 20 18  '$ Temp: 98.5 F (36.9 C) 98.4 F (36.9 C) 98.9 F (37.2 C) 98 F (36.7 C)   TempSrc: Oral Oral  Oral  SpO2: 98% 98% 100% 99%  Weight:      Height:        Intake/Output Summary (Last 24 hours) at 05/12/2022 1020 Last data filed at 05/12/2022 0400 Gross per 24 hour  Intake 402.52 ml  Output 400 ml  Net 2.52 ml    Filed Weights   05/10/22 0404  Weight: 81 kg    General appearance: Awake alert.  In no distress Resp: Clear to auscultation bilaterally.  Normal effort Cardio: S1-S2 is normal regular.  No S3-S4.  No rubs murmurs or bruit GI: Abdomen is soft.  Nontender nondistended.  Bowel sounds are present normal.  No masses organomegaly Extremities: No edema.     Lab Results:  Data Reviewed: I have personally reviewed following labs and reports of the imaging studies  CBC: Recent Labs  Lab 05/07/22 1258 05/10/22 0505 05/11/22 0332 05/12/22 0406  WBC 6.8 6.1 5.1 5.4  NEUTROABS 5.3 4.7  --   --   HGB 12.6* 11.8* 11.1* 11.8*  HCT 37.2* 35.2* 33.3* 34.2*  MCV 93.2 93.4 94.3 92.4  PLT 261 239 220 188     Basic Metabolic Panel: Recent Labs  Lab 05/07/22 1258 05/08/22 0447 05/10/22 0505 05/10/22 1022 05/11/22 0332 05/12/22 0406  NA 139 141 140  --  143 141  K 3.8 4.0 3.6  --  3.6 4.7  CL 105 111 109  --  111 109  CO2 25 25 21*  --  22 21*  GLUCOSE 132* 91 119*  --  78 98  BUN 24* 22 28*  --  17 23  CREATININE 1.35* 1.38* 1.47*  --  1.32* 1.36*  CALCIUM 9.8 9.3 9.5  --  9.4 9.6  MG  --   --   --  2.2  --   --      GFR: Estimated Creatinine Clearance: 43.3 mL/min (A) (by C-G formula based on SCr of 1.36 mg/dL (H)).  Liver Function Tests: Recent Labs  Lab 05/07/22 1258 05/10/22 0505  AST 24 24  ALT 32 30  ALKPHOS 73 60  BILITOT 0.8 0.7  PROT 7.6 6.9  ALBUMIN 4.1 3.9      Coagulation Profile: Recent Labs  Lab 05/10/22 0505  INR 1.0      CBG: Recent Labs  Lab 05/11/22 1608 05/11/22 1937 05/11/22 2335 05/12/22 0404 05/12/22 0818  GLUCAP 108* 154* 72 96 118*      Recent Results (from the past 240  hour(s))  Urine Culture     Status: None   Collection Time: 05/07/22  7:00 PM   Specimen: Urine, Clean Catch  Result Value Ref Range Status   Specimen Description   Final    URINE, CLEAN CATCH Performed at [facility], [address], [place], [place] [address]    Special Requests   Final    NONE Performed at [facility], [address], [place], [place] [address]    Culture   Final    NO GROWTH Performed at [facility], [place] [address], [place], [place] [address]    Report Status 05/09/2022 FINAL  Final      Radiology Studies: MR BRAIN WO CONTRAST  Result Date: 05/11/2022 CLINICAL DATA:  Stroke suspected EXAM: MRI  HEAD WITHOUT CONTRAST TECHNIQUE: Multiplanar, multiecho pulse sequences of the brain and surrounding structures were obtained without intravenous contrast. COMPARISON:  05/07/2022 MRI head FINDINGS: Brain: Restricted diffusion with ADC correlate in the right corona radiata, adjacent to the areas of restricted diffusion seen on the prior MRI. The new focus measures up to 13 x 16 mm in the axial plane (AP x TR) (series 2, image 33). This is associated with increased T2 signal and favored to represent an acute infarct. No acute hemorrhage, mass, mass effect, or midline shift. No hydrocephalus or extra-axial collection. Scattered T2 hyperintense signal in the periventricular white matter, likely the sequela of mild chronic small vessel ischemic disease. Vascular: Normal arterial flow voids. Skull and upper cervical spine: Normal marrow signal. Sinuses/Orbits: Clear paranasal sinuses. Status post bilateral lens replacements. Other: The mastoids are well aerated. IMPRESSION: New acute infarct in the right corona radiata, adjacent to the previously noted acute infarcts on 05/07/2022. These results will be called to the ordering clinician or representative by the Radiologist Assistant, and communication documented in the PACS or [employer]. Electronically Signed   By: Merilyn Baba M.D.   On: 05/11/2022 02:47       LOS: 1 day   [place] [facility] Pager on [email or web address]  05/12/2022, 10:20 AM

## 2022-05-12 NOTE — Telephone Encounter (Signed)
-----   Message from Barton Dubois, MD sent at 05/08/2022  1:06 PM EST ----- Please arrange 30 day monitoring for patient; admitted to the hospital around thanksgiving time with acute/subacute stroke.  Thanks

## 2022-05-12 NOTE — Progress Notes (Signed)
Physical Therapy Treatment Patient Details Name: Patrick Moss MRN: [id number] DOB: 1941/05/19 Today's Date: 05/12/2022   History of Present Illness 81 y.o. male who presents 05/10/22 with left hand weakness x 3 days. NIHSS=4; MRI  acute infarct in the right corona radiata, adjacent to the  previously noted acute infarcts on 05/07/2022.   PMH significant for BPH, CKD 3A, hyperlipidemia, hypertension, osteoarthritis, diabetes type 2 on insulin, prostate malignancy    PT Comments    Patient up with OT on arrival, completing session. Progressed ambulation to 350 ft with cane in LUE with good sequencing and adequate support (no imbalance noted). Patient continues to report he is walking more slowly than his normal, although his velocity was much improved compared to 05/11/22. Standing exercises with single UE support with good balance.     Recommendations for follow up therapy are one component of a multi-disciplinary discharge planning process, led by the attending physician.  Recommendations may be updated based on patient status, additional functional criteria and insurance authorization.  Follow Up Recommendations  Home health PT     Assistance Recommended at Discharge PRN  Patient can return home with the following Help with stairs or ramp for entrance;Assist for transportation   Equipment Recommendations  None recommended by PT    Recommendations for Other Services       Precautions / Restrictions Precautions Precautions: Fall Restrictions Weight Bearing Restrictions: No     Mobility  Bed Mobility                    Transfers Overall transfer level: Needs assistance Equipment used: Straight cane Transfers: Sit to/from Stand Sit to Stand: Min guard           General transfer comment: min guard for safety only    Ambulation/Gait Ambulation/Gait assistance: Min guard Gait Distance (Feet): 350 Feet Assistive device: Straight cane Gait  Pattern/deviations: Step-through pattern, Decreased step length - right, Decreased step length - left, Decreased stance time - right, Decreased weight shift to right, Trunk flexed       General Gait Details: pt sequences with cane correctly >90% of the time (uses in his left hand due to chronic rt knee pain); decr wt-shift over RLE; pt reports he is not walking as quickly as he normally would (although velocity was significantly better than previous session)   Stairs             Wheelchair Mobility    Modified Rankin (Stroke Patients Only) Modified Rankin (Stroke Patients Only) Pre-Morbid Rankin Score: Moderate disability Modified Rankin: Moderately severe disability     Balance Overall balance assessment: Needs assistance Sitting-balance support: No upper extremity supported, Feet supported Sitting balance-Leahy Scale: Good     Standing balance support: Single extremity supported Standing balance-Leahy Scale: Fair                              Cognition Arousal/Alertness: Awake/alert Behavior During Therapy: WFL for tasks assessed/performed Overall Cognitive Status: Impaired/Different from baseline Area of Impairment: Memory, Safety/judgement, Awareness                     Memory: Decreased short-term memory   Safety/Judgement: Decreased awareness of deficits Awareness: Emergent            Exercises General Exercises - Lower Extremity Long Arc Quad: AROM, Both, 10 reps, Seated Hip Flexion/Marching: AAROM, Both, 5 reps, Standing (LUE support) Toe Raises: AROM,  Both, 10 reps, Seated Heel Raises: AROM, Both, 10 reps, Seated    General Comments General comments (skin integrity, edema, etc.): Brother present      Pertinent Vitals/Pain Pain Assessment Pain Assessment: No/denies pain    Home Living                          Prior Function            PT Goals (current goals can now be found in the care plan section) Acute  Rehab PT Goals Patient Stated Goal: return home with [facility] therapies Time For Goal Achievement: 05/25/22 Potential to Achieve Goals: Good Progress towards PT goals: Progressing toward goals    Frequency    Min 3X/week      PT Plan Current plan remains appropriate    Co-evaluation              AM-PAC PT "6 Clicks" Mobility   Outcome Measure  Help needed turning from your back to your side while in a flat bed without using bedrails?: A Little Help needed moving from lying on your back to sitting on the side of a flat bed without using bedrails?: A Little Help needed moving to and from a bed to a chair (including a wheelchair)?: A Little Help needed standing up from a chair using your arms (e.g., wheelchair or bedside chair)?: A Little Help needed to walk in hospital room?: A Little Help needed climbing 3-5 steps with a railing? : A Little 6 Click Score: 18    End of Session Equipment Utilized During Treatment: Gait belt Activity Tolerance: Patient tolerated treatment well Patient left: in chair;with call bell/phone within reach;with chair alarm set   PT Visit Diagnosis: Other abnormalities of gait and mobility (R26.89)     Time: 3710-6269 PT Time Calculation (min) (ACUTE ONLY): 9 min  Charges:  $Gait Training: 8-22 mins                      [place]  Office [phone]    Rexanne Mano 05/12/2022, 2:15 PM

## 2022-05-12 NOTE — Progress Notes (Signed)
Mobility Specialist: Progress Note   05/12/22 1332  Mobility  Activity Ambulated with assistance in hallway  Level of Assistance Contact guard assist, steadying assist  Assistive Device Cane  Distance Ambulated (ft) 150 ft  Activity Response Tolerated well  Mobility Referral Yes  $Mobility charge 1 Mobility   Pt received in the chair and agreeable to mobility. No c/o throughout. Mild unsteady gait but no overt LOB. Pt back to bed after session with call bell and phone in reach. Transport staff arrived to take pt off unit.   [place] Georgi Navarrete Mobility Specialist Please contact via SecureChat or Rehab office at [phone]

## 2022-05-12 NOTE — Progress Notes (Signed)
Modified Barium Swallow Progress Note  Patient Details  Name: Patrick Moss MRN: [id number] Date of Birth: September 22, 1940  Today's Date: 05/12/2022  Modified Barium Swallow completed.  Full report located under Chart Review in the Imaging Section.  Brief recommendations include the following:  Clinical Impression  Pt demonstrates a mild oral dysphagia with decreased buccal tension with mild left oral residue but minimal anterior spillage. Mastication would be easier if dentures were present. There is a moderate pharyngeal dysphagia with a slight delay in swallow initaition, coupled with structural differences in cervical spine, Pt with bony protrusion at C1/2 and a significant curvature impacting epiglottic deflection. If pt slightly tucks his chin, the mobility of epiglottis for deflection improves and laryngeal closure also achieved by the time liquid passes the vestibule. Recommend a dys 3 mechanical soft solid with thin liquids with a chin tuck, single sips and pills crushed. Will f/u to reinforce precautions.   Swallow Evaluation Recommendations       SLP Diet Recommendations: Dysphagia 3 (Mech soft) solids;Thin liquid   Liquid Administration via: Cup;Straw   Medication Administration: Crushed with puree   Supervision: Patient able to self feed   Compensations: Slow rate;Small sips/bites;Chin tuck;Clear throat intermittently   Postural Changes: Seated upright at 90 degrees;Remain semi-upright after after feeds/meals (Comment)   Oral Care Recommendations: Oral care before and after PO   Other Recommendations: Have oral suction available  Herbie Baltimore, MA CCC-SLP  Acute Rehabilitation Services Secure Chat Preferred Office [phone]   Lynann Beaver 05/12/2022,2:50 PM

## 2022-05-13 ENCOUNTER — Other Ambulatory Visit (HOSPITAL_COMMUNITY): Payer: Self-pay

## 2022-05-13 ENCOUNTER — Other Ambulatory Visit: Payer: Self-pay | Admitting: Radiology

## 2022-05-13 DIAGNOSIS — R040 Epistaxis: Secondary | ICD-10-CM | POA: Diagnosis not present

## 2022-05-13 DIAGNOSIS — I10 Essential (primary) hypertension: Secondary | ICD-10-CM | POA: Diagnosis not present

## 2022-05-13 DIAGNOSIS — I63411 Cerebral infarction due to embolism of right middle cerebral artery: Secondary | ICD-10-CM | POA: Diagnosis not present

## 2022-05-13 DIAGNOSIS — I63231 Cerebral infarction due to unspecified occlusion or stenosis of right carotid arteries: Secondary | ICD-10-CM | POA: Diagnosis not present

## 2022-05-13 DIAGNOSIS — R1312 Dysphagia, oropharyngeal phase: Secondary | ICD-10-CM | POA: Diagnosis not present

## 2022-05-13 LAB — GLUCOSE, CAPILLARY
Glucose-Capillary: 126 mg/dL — ABNORMAL HIGH (ref 70–99)
Glucose-Capillary: 127 mg/dL — ABNORMAL HIGH (ref 70–99)
Glucose-Capillary: 127 mg/dL — ABNORMAL HIGH (ref 70–99)
Glucose-Capillary: 131 mg/dL — ABNORMAL HIGH (ref 70–99)
Glucose-Capillary: 150 mg/dL — ABNORMAL HIGH (ref 70–99)

## 2022-05-13 LAB — PROTIME-INR
INR: 1.1 (ref 0.8–1.2)
Prothrombin Time: 14.2 seconds (ref 11.4–15.2)

## 2022-05-13 MED ORDER — OXYMETAZOLINE HCL 0.05 % NA SOLN
1.0000 | Freq: Two times a day (BID) | NASAL | Status: DC
  Administered 2022-05-13 – 2022-05-15 (×4): 1 via NASAL
  Filled 2022-05-13 (×3): qty 30

## 2022-05-13 MED ORDER — SALINE SPRAY 0.65 % NA SOLN: 2.0000 | NASAL | Status: DC | PRN

## 2022-05-13 MED ORDER — INSULIN ASPART 100 UNIT/ML IJ SOLN
0.0000 [IU] | Freq: Three times a day (TID) | INTRAMUSCULAR | Status: DC
  Administered 2022-05-13 – 2022-05-14 (×3): 2 [IU] via SUBCUTANEOUS
  Administered 2022-05-14: 3 [IU] via SUBCUTANEOUS
  Administered 2022-05-15: 2 [IU] via SUBCUTANEOUS
  Administered 2022-05-15: 3 [IU] via SUBCUTANEOUS

## 2022-05-13 NOTE — Progress Notes (Signed)
[facility] PROGRESS NOTE   INRI SOBIESKI WGY:[id number] DOB: 09-10-1940 DOA: 05/10/2022  PCP: [name], MD  Brief History/Interval Summary: 81 y.o. male with medical history significant for hypertension, diabetes, chronic kidney disease stage IIIa, was recently discharged from the hospital on 11/23 after being evaluated for acute/subacute stroke of right corona radiata.  At that time, he had presented with facial droop and slurred speech.  He was noted to have carotid stenosis on carotid ultrasound.  Seen by neurology with recommendations for outpatient follow-up with interventional radiology to be considered for stenting.  He was started on aspirin and Plavix. After returning home, he reported worsening of his dysphagia/dysarthria.  He noted that he had trouble maintaining his oral secretions causing him to drool out of the left side of his mouth.  Subsequently, he began noticing weakness in his left hand.  He was having trouble holding his cane and was dropping things from his left hand.  Due to development of new symptoms, he came to the ER for evaluation.  Patient was transferred over to [facility] for further evaluation. A new stroke was identified on MRI.  Consultants: Neurology.  Neurointerventional radiology.  Procedures: None    Subjective/Interval History: Patient's speech continues to improve.  The left arm weakness is also getting better.  Patient brother is at the bedside.  Patient also reports that he had some bleeding from his right nostril overnight.     Assessment/Plan:  Acute stroke Patient has had recurrent strokes.  Recently hospitalized for same.  Has a right RCA stenosis of 60%. Presented with left hand weakness and difficulty speaking and difficulty swallowing. MRI shows new stroke. Patient transferred to [facility].  Seen by stroke service.  Neurointerventional radiology has been consulted by them to consider stenting of the right ICA.    Patient was on aspirin and Plavix.  Has been changed over to aspirin and Brilinta.  Noted to be on rosuvastatin and Zetia. LDL was noted to be 24 when checked recently.  HbA1c 6.1. Echocardiogram showed normal systolic function of the left ventricle with grade 1 diastolic dysfunction. Seen by speech therapy.  Diet has been advanced to dysphagia 3 diet with thin liquids.  Seen by PT and OT who recommends home health. Screened by inpatient rehabilitation and not thought to be a good candidate for CIR.  They also recommend home health. Plan is for cerebral angiogram and possible intervention on 11/29.  Essential hypertension Permissive hypertension allowed.  Blood pressures are reasonably well-controlled.  Holding valsartan hydrochlorothiazide and amlodipine.  Chronic kidney disease stage IIIa Seems to be at baseline.  Monitor urine output.  Avoid nephrotoxic agents.  Recheck labs tomorrow.  Epistaxis Had mild epistaxis from right nostril overnight.  Patient is on aspirin and Brilinta.  Afrin will be prescribed.  Diabetes mellitus type 2, controlled Holding all home medications including metformin Trulicity basal insulin.  Monitor CBGs.  Continue SSI.  Oropharyngeal dysphagia Speech therapy is following.  Currently on dysphagia 3 diet.  GERD Continue PPI.  Normocytic anemia No evidence of overt bleeding.  Continue to monitor.  DVT Prophylaxis: Lovenox Code Status: Full code Family Communication: Discussed with patient Disposition Plan: Plan is for discharge home with home health when medically stable.  Status is: Inpatient Remains inpatient appropriate because: Recurrent stroke, right ICA stenosis     Medications: Scheduled:  aspirin  81 mg Oral Daily   enoxaparin (LOVENOX) injection  40 mg Subcutaneous Q24H   insulin aspart  0-15  Units Subcutaneous TID WC   oxymetazoline  1 spray Each Nare BID   pantoprazole  40 mg Oral Daily   rosuvastatin  5 mg Oral Daily    ticagrelor  90 mg Oral BID   Continuous:   [id number] **OR** acetaminophen (TYLENOL) oral liquid 160 mg/5 mL **OR** acetaminophen, senna-docusate, sodium chloride  Antibiotics: Anti-infectives (From admission, onward)    None       Objective:  Vital Signs  Vitals:   05/12/22 1900 05/12/22 2320 05/13/22 0422 05/13/22 0843  BP: 123/65 (!) 129/54 123/70 (!) 147/76  Pulse: 68 (!) 57 (!) 58 62  Resp: 16   16  Temp: 98.1 F (36.7 C) 98.6 F (37 C) 98.3 F (36.8 C) 98.1 F (36.7 C)  TempSrc: Oral Oral Oral Oral  SpO2: 96% 97% 97%   Weight:      Height:       No intake or output data in the 24 hours ending 05/13/22 1005  Filed Weights   05/10/22 0404  Weight: 81 kg    General appearance: Awake alert.  In no distress Dried blood noted on the right nostril.  No active bleeding noted. Resp: Clear to auscultation bilaterally.  Normal effort Cardio: S1-S2 is normal regular.  No S3-S4.  No rubs murmurs or bruit GI: Abdomen is soft.  Nontender nondistended.  Bowel sounds are present normal.  No masses organomegaly Extremities: No edema.  All of his extremities. Less dysarthria noted.  Improved strength of the left upper extremity.  Lab Results:  Data Reviewed: I have personally reviewed following labs and reports of the imaging studies  CBC: Recent Labs  Lab 05/07/22 1258 05/10/22 0505 05/11/22 0332 05/12/22 0406  WBC 6.8 6.1 5.1 5.4  NEUTROABS 5.3 4.7  --   --   HGB 12.6* 11.8* 11.1* 11.8*  HCT 37.2* 35.2* 33.3* 34.2*  MCV 93.2 93.4 94.3 92.4  PLT 261 239 220 188     Basic Metabolic Panel: Recent Labs  Lab 05/07/22 1258 05/08/22 0447 05/10/22 0505 05/10/22 1022 05/11/22 0332 05/12/22 0406  NA 139 141 140  --  143 141  K 3.8 4.0 3.6  --  3.6 4.7  CL 105 111 109  --  111 109  CO2 25 25 21*  --  22 21*  GLUCOSE 132* 91 119*  --  78 98  BUN 24* 22 28*  --  17 23  CREATININE 1.35* 1.38* 1.47*  --  1.32* 1.36*  CALCIUM 9.8 9.3 9.5  --  9.4 9.6   MG  --   --   --  2.2  --   --      GFR: Estimated Creatinine Clearance: 43.3 mL/min (A) (by C-G formula based on SCr of 1.36 mg/dL (H)).  Liver Function Tests: Recent Labs  Lab 05/07/22 1258 05/10/22 0505  AST 24 24  ALT 32 30  ALKPHOS 73 60  BILITOT 0.8 0.7  PROT 7.6 6.9  ALBUMIN 4.1 3.9      Coagulation Profile: Recent Labs  Lab 05/10/22 0505 05/13/22 0522  INR 1.0 1.1      CBG: Recent Labs  Lab 05/12/22 1642 05/12/22 2032 05/12/22 2350 05/13/22 0039 05/13/22 0421  GLUCAP 174* 141* 113* 131* 127*      Recent Results (from the past 240 hour(s))  Urine Culture     Status: None   Collection Time: 05/07/22  7:00 PM   Specimen: Urine, Clean Catch  Result Value Ref Range Status   Specimen  Description   Final    URINE, CLEAN CATCH Performed at [facility], [address], [place], [place] [address]    Special Requests   Final    NONE Performed at [facility], [address], [place], [place] [address]    Culture   Final    NO GROWTH Performed at [facility], [place] [address] [place], [place] [address]    Report Status 05/09/2022 FINAL  Final      Radiology Studies: No results found.     LOS: 2 days   [place] [facility] Pager on [email or web address]  05/13/2022, 10:05 AM

## 2022-05-13 NOTE — Progress Notes (Signed)
Seen with Dr. Estanislado Pandy Brother at bedside. Pt thinks speech and left hand coordination/strength improving. BP (!) 147/76 (BP Location: Right Arm)   Pulse 62   Temp 98.1 F (36.7 C) (Oral)   Resp 16   Ht '5\' 6"'$  (1.676 m)   Wt 178 lb 9.2 oz (81 kg)   SpO2 97%   BMI 28.82 kg/m    Plan for (R)ICA angioplasty tomorrow. Cont ASA/Brilinta Pt seen/consented already Will see again tomorrow am prior to procedure.   Ascencion Dike PA-C Interventional Radiology 05/13/2022 9:37 AM

## 2022-05-13 NOTE — Plan of Care (Signed)

## 2022-05-13 NOTE — Progress Notes (Signed)
STROKE TEAM PROGRESS NOTE   INTERVAL HISTORY His brother is walking with him in the hallway. Left facial droop improved. Plan for right ICA stenting in am.    Vitals:   05/13/22 0422 05/13/22 0843 05/13/22 1114 05/13/22 1547  BP: 123/70 (!) 147/76 127/68 (!) 142/65  Pulse: (!) 58 62 79 66  Resp:  '16 16 20  '$ Temp: 98.3 F (36.8 C) 98.1 F (36.7 C) 98.7 F (37.1 C) 99 F (37.2 C)  TempSrc: Oral Oral Oral Oral  SpO2: 97%  97% 100%  Weight:      Height:       CBC:  Recent Labs  Lab 05/07/22 1258 05/10/22 0505 05/11/22 0332 05/12/22 0406  WBC 6.8 6.1 5.1 5.4  NEUTROABS 5.3 4.7  --   --   HGB 12.6* 11.8* 11.1* 11.8*  HCT 37.2* 35.2* 33.3* 34.2*  MCV 93.2 93.4 94.3 92.4  PLT 261 239 220 272   Basic Metabolic Panel:  Recent Labs  Lab 05/10/22 1022 05/11/22 0332 05/12/22 0406  NA  --  143 141  K  --  3.6 4.7  CL  --  111 109  CO2  --  22 21*  GLUCOSE  --  78 98  BUN  --  17 23  CREATININE  --  1.32* 1.36*  CALCIUM  --  9.4 9.6  MG 2.2  --   --    Lipid Panel:  Recent Labs  Lab 05/08/22 0447  CHOL 92  TRIG 144  HDL 39*  CHOLHDL 2.4  VLDL 29  LDLCALC 24   HgbA1c:  Recent Labs  Lab 05/07/22 1258  HGBA1C 6.1*    IMAGING past 24 hours No results found.  PHYSICAL EXAM  Temp:  [98.1 F (36.7 C)-99 F (37.2 C)] 99 F (37.2 C) (11/28 1547) Pulse Rate:  [57-79] 66 (11/28 1547) Resp:  [16-20] 20 (11/28 1547) BP: (123-147)/(54-76) 142/65 (11/28 1547) SpO2:  [97 %-100 %] 100 % (11/28 1547)  General - Well nourished, well developed, in no apparent distress  Cardiovascular - Regular rhythm and rate.  Mental Status -  Level of arousal and orientation to time, place, and person were intact.dysarthria, no aphasia Language including expression, naming, repetition, comprehension was assessed and found intact. Attention span and concentration were normal. Recent and remote memory were intact. Fund of Knowledge was assessed and was intact.  Cranial Nerves  II - XII - II - Visual field intact OU. III, IV, VI - Extraocular movements intact. V - Facial sensation intact bilaterally. VII - Facial movement intact bilaterally. VIII - Hearing & vestibular intact bilaterally. X - Palate elevates symmetrically. XI - Chin turning & shoulder shrug intact bilaterally. XII - Tongue protrusion intact.  Motor Strength - left arm 4/5, left leg 4/5 right arm 5/5, right leg 5/5. Left grip strength 3/5.  Motor Tone - Muscle tone was assessed at the neck and appendages and was normal.  Sensory - Light touch, temperature/pinprick were assessed and were symmetrical.    Coordination - mild ataxia left   Gait and Station - deferred.  ASSESSMENT/PLAN [name] is a 81 y.o. male with history of  hypertension, diabetes, chronic kidney disease stage IIIa, was recently discharged from the hospital on 11/23 after being evaluated for acute/subacute stroke of right corona radiata.  At that time, he had presented with right facial droop and slurred speech.  He was noted to have carotid stenosis on carotid ultrasound.  Seen by neurology with recommendations  for outpatient follow-up with interventional radiology to be considered for stenting.  He was started on aspirin and Plavix. After returning home, he reported worsening of his dysphagia/dysarthria.  He noted that he had trouble maintaining his oral secretions causing him to drool out of the left side of his mouth.  Yesterday, he began noticing weakness in his left hand.  He was having trouble holding his cane and was dropping things from his left hand.  Due to development of new symptoms, he came to the ER for evaluation.   Stroke: Acute right corona radiata ischemic infarct  Etiology:  due to right ICA stenosis/atherosclerosis CT head  1. Focal area of low attenuation within the right corona radiata corresponding to areas of restricted diffusion on recent MRI and compatible with evolutionary changes of previously  noted acute to early subacute infarcts noted on 05/07/2022. 2. Chronic small vessel ischemic disease and brain atrophy. CTA head & neck  1. Negative for large vessel occlusion. But Positive for complex plaque in the proximal Right ICA with a possible small focus of superimposed adherent thrombus (series 8, image 141 and series 4, image 113). Hemodynamically significant stenosis there estimated at 60%. And positive also for bulky bilateral calcified plaque of the Dominant Right Vertebral V4 segment with up to Severe stenosis. This is distal to the Right PICA, and the Basilar artery is patent without stenosis. 2. Additionally, mild to moderate Left ICA siphon petrous and cavernous segment atherosclerotic stenosis. MRI   New acute infarct in the right corona radiata, adjacent to the previously noted acute infarcts on 05/07/2022 2D Echo 11/22: EF 60-65%. Left ventricular diastolic parameters are consistent with Grade I diastolic dysfunction. Left atrial size was mildly dilated.  Dr. Estanislado Pandy  plan for cerebral angiogram and potential right ICA stenting scheduled tomorrow.  LDL 24 HgbA1c 6.1 VTE prophylaxis - Lovenox aspirin 325 mg daily and clopidogrel 75 mg daily prior to admission, now on aspirin 325 mg daily and Brilinta (ticagrelor) 90 mg bid per IR  Therapy recommendations:  HH PT/OT  Disposition:  pending  Hypertension Home meds:  norvasc '5mg'$ , diovan 320-'25mg'$  Stable Avoid low BP Long-term BP goal normotensive  Hyperlipidemia Home meds:  zetia and crestor '5mg'$  LDL 24, goal < 70 Continue Crestor '5mg'$  Continue statin at discharge  Diabetes type II Controlled Home meds:  insulin, metformin HgbA1c 6.1, goal < 7.0 CBGs SSI  Other Stroke Risk Factors Advanced Age >/= 28  Hx stroke/TIA Coronary artery disease  Other Active Problems BPH CKD3, creatinine 1.35-1.38-1.47-1.36  Hospital day # 2  Rosalin Hawking, MD PhD Stroke Neurology 05/13/2022 7:21 PM    To contact Stroke  Continuity provider, please refer to [email or web address] After hours, contact General Neurology

## 2022-05-13 NOTE — Progress Notes (Signed)
Mobility Specialist: Progress Note   05/13/22 1612  Mobility  Activity Ambulated with assistance in hallway  Level of Assistance Contact guard assist, steadying assist  Assistive Device Cane  Distance Ambulated (ft) 150 ft  Activity Response Tolerated well  Mobility Referral Yes  $Mobility charge 1 Mobility   Pt received in the chair and agreeable to mobility. No c/o throughout. Pt back to bed after session with call bell and phone in reach.   [place]man Patrick Moss Mobility Specialist Please contact via SecureChat or Rehab office at [phone]

## 2022-05-14 ENCOUNTER — Inpatient Hospital Stay (HOSPITAL_COMMUNITY): Payer: Medicare HMO

## 2022-05-14 ENCOUNTER — Inpatient Hospital Stay (HOSPITAL_COMMUNITY): Payer: Medicare HMO | Admitting: Certified Registered"

## 2022-05-14 ENCOUNTER — Other Ambulatory Visit: Payer: Self-pay

## 2022-05-14 ENCOUNTER — Ambulatory Visit: Payer: Medicare HMO | Admitting: Family Medicine

## 2022-05-14 ENCOUNTER — Encounter (HOSPITAL_COMMUNITY): Payer: Self-pay | Admitting: Internal Medicine

## 2022-05-14 ENCOUNTER — Encounter (HOSPITAL_COMMUNITY)
Admission: EM | Disposition: A | Discharge status: Home Health | Payer: Self-pay | Source: Home / Self Care | Attending: Internal Medicine

## 2022-05-14 DIAGNOSIS — N189 Chronic kidney disease, unspecified: Secondary | ICD-10-CM

## 2022-05-14 DIAGNOSIS — I6529 Occlusion and stenosis of unspecified carotid artery: Secondary | ICD-10-CM

## 2022-05-14 DIAGNOSIS — I771 Stricture of artery: Secondary | ICD-10-CM

## 2022-05-14 DIAGNOSIS — D631 Anemia in chronic kidney disease: Secondary | ICD-10-CM

## 2022-05-14 DIAGNOSIS — I639 Cerebral infarction, unspecified: Secondary | ICD-10-CM | POA: Diagnosis not present

## 2022-05-14 DIAGNOSIS — E1122 Type 2 diabetes mellitus with diabetic chronic kidney disease: Secondary | ICD-10-CM

## 2022-05-14 DIAGNOSIS — I63231 Cerebral infarction due to unspecified occlusion or stenosis of right carotid arteries: Secondary | ICD-10-CM | POA: Diagnosis present

## 2022-05-14 DIAGNOSIS — I129 Hypertensive chronic kidney disease with stage 1 through stage 4 chronic kidney disease, or unspecified chronic kidney disease: Secondary | ICD-10-CM

## 2022-05-14 HISTORY — PX: RADIOLOGY WITH ANESTHESIA: SHX6223

## 2022-05-14 HISTORY — PX: IR INTRAVSC STENT CERV CAROTID W/EMB-PROT MOD SED INCL ANGIO: IMG2303

## 2022-05-14 HISTORY — PX: IR ANGIO INTRA EXTRACRAN SEL COM CAROTID INNOMINATE UNI R MOD SED: IMG5359

## 2022-05-14 HISTORY — PX: IR ANGIO VERTEBRAL SEL VERTEBRAL UNI R MOD SED: IMG5368

## 2022-05-14 LAB — TYPE AND SCREEN
ABO/RH(D): O POS
Antibody Screen: NEGATIVE

## 2022-05-14 LAB — CBC WITH DIFFERENTIAL/PLATELET
Abs Immature Granulocytes: 0.01 10*3/uL (ref 0.00–0.07)
Basophils Absolute: 0 10*3/uL (ref 0.0–0.1)
Basophils Relative: 0 %
Eosinophils Absolute: 0.1 10*3/uL (ref 0.0–0.5)
Eosinophils Relative: 2 %
HCT: 33.9 % — ABNORMAL LOW (ref 39.0–52.0)
Hemoglobin: 11.8 g/dL — ABNORMAL LOW (ref 13.0–17.0)
Immature Granulocytes: 0 %
Lymphocytes Relative: 22 %
Lymphs Abs: 1 10*3/uL (ref 0.7–4.0)
MCH: 32 pg (ref 26.0–34.0)
MCHC: 34.8 g/dL (ref 30.0–36.0)
MCV: 91.9 fL (ref 80.0–100.0)
Monocytes Absolute: 0.4 10*3/uL (ref 0.1–1.0)
Monocytes Relative: 9 %
Neutro Abs: 3 10*3/uL (ref 1.7–7.7)
Neutrophils Relative %: 67 %
Platelets: 243 10*3/uL (ref 150–400)
RBC: 3.69 MIL/uL — ABNORMAL LOW (ref 4.22–5.81)
RDW: 14.1 % (ref 11.5–15.5)
WBC: 4.5 10*3/uL (ref 4.0–10.5)
nRBC: 0 % (ref 0.0–0.2)

## 2022-05-14 LAB — GLUCOSE, CAPILLARY
Glucose-Capillary: 106 mg/dL — ABNORMAL HIGH (ref 70–99)
Glucose-Capillary: 118 mg/dL — ABNORMAL HIGH (ref 70–99)
Glucose-Capillary: 119 mg/dL — ABNORMAL HIGH (ref 70–99)
Glucose-Capillary: 138 mg/dL — ABNORMAL HIGH (ref 70–99)
Glucose-Capillary: 180 mg/dL — ABNORMAL HIGH (ref 70–99)
Glucose-Capillary: 183 mg/dL — ABNORMAL HIGH (ref 70–99)

## 2022-05-14 LAB — BASIC METABOLIC PANEL
Anion gap: 8 (ref 5–15)
BUN: 17 mg/dL (ref 8–23)
CO2: 22 mmol/L (ref 22–32)
Calcium: 9.4 mg/dL (ref 8.9–10.3)
Chloride: 107 mmol/L (ref 98–111)
Creatinine, Ser: 1.39 mg/dL — ABNORMAL HIGH (ref 0.61–1.24)
GFR, Estimated: 51 mL/min — ABNORMAL LOW (ref >60)
Glucose, Bld: 127 mg/dL — ABNORMAL HIGH (ref 70–99)
Potassium: 3.6 mmol/L (ref 3.5–5.1)
Sodium: 137 mmol/L (ref 135–145)

## 2022-05-14 LAB — MRSA NEXT GEN BY PCR, NASAL: MRSA by PCR Next Gen: NOT DETECTED

## 2022-05-14 LAB — ABO/RH: ABO/RH(D): O POS

## 2022-05-14 LAB — POCT ACTIVATED CLOTTING TIME: Activated Clotting Time: 179 seconds

## 2022-05-14 SURGERY — IR WITH ANESTHESIA
Anesthesia: General

## 2022-05-14 MED ORDER — CEFAZOLIN SODIUM-DEXTROSE 2-4 GM/100ML-% IV SOLN
INTRAVENOUS | Status: AC
  Filled 2022-05-14: qty 100

## 2022-05-14 MED ORDER — ONDANSETRON HCL 4 MG/2ML IJ SOLN
INTRAMUSCULAR | Status: DC | PRN
  Administered 2022-05-14: 4 mg via INTRAVENOUS

## 2022-05-14 MED ORDER — NITROGLYCERIN 1 MG/10 ML FOR IR/CATH LAB
INTRA_ARTERIAL | Status: AC
  Filled 2022-05-14: qty 10

## 2022-05-14 MED ORDER — CLEVIDIPINE BUTYRATE 0.5 MG/ML IV EMUL
INTRAVENOUS | Status: AC
  Filled 2022-05-14: qty 50

## 2022-05-14 MED ORDER — CHLORHEXIDINE GLUCONATE 0.12 % MT SOLN
15.0000 mL | Freq: Once | OROMUCOSAL | Status: AC
  Administered 2022-05-14: 15 mL via OROMUCOSAL

## 2022-05-14 MED ORDER — FENTANYL CITRATE (PF) 250 MCG/5ML IJ SOLN
INTRAMUSCULAR | Status: DC | PRN
  Administered 2022-05-14: 100 ug via INTRAVENOUS

## 2022-05-14 MED ORDER — SUGAMMADEX SODIUM 200 MG/2ML IV SOLN
INTRAVENOUS | Status: DC | PRN
  Administered 2022-05-14: 200 mg via INTRAVENOUS

## 2022-05-14 MED ORDER — HEPARIN (PORCINE) 25000 UT/250ML-% IV SOLN
INTRAVENOUS | Status: AC
  Filled 2022-05-14: qty 250

## 2022-05-14 MED ORDER — ROCURONIUM BROMIDE 10 MG/ML (PF) SYRINGE
PREFILLED_SYRINGE | INTRAVENOUS | Status: DC | PRN
  Administered 2022-05-14: 80 mg via INTRAVENOUS

## 2022-05-14 MED ORDER — ACETAMINOPHEN 10 MG/ML IV SOLN: 1000.0000 mg | Freq: Once | INTRAVENOUS | Status: DC | PRN

## 2022-05-14 MED ORDER — EPTIFIBATIDE 20 MG/10ML IV SOLN
INTRAVENOUS | Status: AC
  Filled 2022-05-14: qty 10

## 2022-05-14 MED ORDER — ASPIRIN 81 MG PO CHEW
81.0000 mg | CHEWABLE_TABLET | Freq: Every day | ORAL | Status: DC
  Administered 2022-05-15: 81 mg via ORAL
  Filled 2022-05-14: qty 1

## 2022-05-14 MED ORDER — TICAGRELOR 90 MG PO TABS: 90.0000 mg | ORAL_TABLET | Freq: Two times a day (BID) | ORAL | Status: DC

## 2022-05-14 MED ORDER — ACETAMINOPHEN 650 MG RE SUPP: 650.0000 mg | RECTAL | Status: DC | PRN

## 2022-05-14 MED ORDER — CLEVIDIPINE BUTYRATE 0.5 MG/ML IV EMUL
0.0000 mg/h | INTRAVENOUS | Status: DC
  Administered 2022-05-14: 2 mg/h via INTRAVENOUS
  Administered 2022-05-14: 10 mg/h via INTRAVENOUS
  Administered 2022-05-14: 12 mg/h via INTRAVENOUS
  Filled 2022-05-14 (×3): qty 50

## 2022-05-14 MED ORDER — SODIUM CHLORIDE 0.9 % IV SOLN: INTRAVENOUS | Status: DC

## 2022-05-14 MED ORDER — HEPARIN SODIUM (PORCINE) 1000 UNIT/ML IJ SOLN
INTRAMUSCULAR | Status: DC | PRN
  Administered 2022-05-14: 1000 [IU] via INTRAVENOUS
  Administered 2022-05-14: 3000 [IU] via INTRAVENOUS

## 2022-05-14 MED ORDER — HEPARIN (PORCINE) 25000 UT/250ML-% IV SOLN
500.0000 [IU]/h | INTRAVENOUS | Status: DC
  Administered 2022-05-14: 500 [IU]/h via INTRAVENOUS

## 2022-05-14 MED ORDER — ACETAMINOPHEN 500 MG PO TABS: 1000.0000 mg | ORAL_TABLET | Freq: Once | ORAL | Status: DC | PRN

## 2022-05-14 MED ORDER — PHENYLEPHRINE 80 MCG/ML (10ML) SYRINGE FOR IV PUSH (FOR BLOOD PRESSURE SUPPORT)
PREFILLED_SYRINGE | INTRAVENOUS | Status: DC | PRN
  Administered 2022-05-14 (×3): 40 ug via INTRAVENOUS

## 2022-05-14 MED ORDER — PHENYLEPHRINE HCL-NACL 20-0.9 MG/250ML-% IV SOLN
INTRAVENOUS | Status: DC | PRN
  Administered 2022-05-14: 40 ug/min via INTRAVENOUS

## 2022-05-14 MED ORDER — CHLORHEXIDINE GLUCONATE CLOTH 2 % EX PADS
6.0000 | MEDICATED_PAD | Freq: Every day | CUTANEOUS | Status: DC
  Administered 2022-05-14: 6 via TOPICAL

## 2022-05-14 MED ORDER — PROPOFOL 10 MG/ML IV BOLUS
INTRAVENOUS | Status: DC | PRN
  Administered 2022-05-14: 90 mg via INTRAVENOUS

## 2022-05-14 MED ORDER — ASPIRIN 81 MG PO CHEW: 81.0000 mg | CHEWABLE_TABLET | Freq: Every day | ORAL | Status: DC

## 2022-05-14 MED ORDER — ACETAMINOPHEN 160 MG/5ML PO SOLN: 1000.0000 mg | Freq: Once | ORAL | Status: DC | PRN

## 2022-05-14 MED ORDER — FENTANYL CITRATE (PF) 100 MCG/2ML IJ SOLN: 25.0000 ug | INTRAMUSCULAR | Status: DC | PRN

## 2022-05-14 MED ORDER — ORAL CARE MOUTH RINSE: 15.0000 mL | Freq: Once | OROMUCOSAL | Status: AC

## 2022-05-14 MED ORDER — HEPARIN (PORCINE) 25000 UT/250ML-% IV SOLN
650.0000 [IU]/h | INTRAVENOUS | Status: DC
  Filled 2022-05-14: qty 250

## 2022-05-14 MED ORDER — ACETAMINOPHEN 160 MG/5ML PO SOLN: 650.0000 mg | ORAL | Status: DC | PRN

## 2022-05-14 MED ORDER — EPTIFIBATIDE 20 MG/10ML IV SOLN
INTRAVENOUS | Status: DC | PRN
  Administered 2022-05-14 (×2): 1.5 mL via INTRA_ARTERIAL

## 2022-05-14 MED ORDER — ACETAMINOPHEN 325 MG PO TABS: 650.0000 mg | ORAL_TABLET | ORAL | Status: DC | PRN

## 2022-05-14 MED ORDER — TICAGRELOR 90 MG PO TABS
90.0000 mg | ORAL_TABLET | Freq: Two times a day (BID) | ORAL | Status: DC
  Administered 2022-05-14 – 2022-05-15 (×2): 90 mg via ORAL
  Filled 2022-05-14 (×2): qty 1

## 2022-05-14 MED ORDER — DEXAMETHASONE SODIUM PHOSPHATE 10 MG/ML IJ SOLN
INTRAMUSCULAR | Status: DC | PRN
  Administered 2022-05-14: 4 mg via INTRAVENOUS

## 2022-05-14 MED ORDER — PHENYLEPHRINE HCL-NACL 20-0.9 MG/250ML-% IV SOLN
INTRAVENOUS | Status: AC
  Filled 2022-05-14: qty 500

## 2022-05-14 MED ORDER — IOHEXOL 300 MG/ML  SOLN
150.0000 mL | Freq: Once | INTRAMUSCULAR | Status: AC | PRN
  Administered 2022-05-14: 75 mL via INTRA_ARTERIAL

## 2022-05-14 MED ORDER — CEFAZOLIN SODIUM-DEXTROSE 2-3 GM-%(50ML) IV SOLR
INTRAVENOUS | Status: DC | PRN
  Administered 2022-05-14: 2 g via INTRAVENOUS

## 2022-05-14 MED ORDER — PROPOFOL 1000 MG/100ML IV EMUL
INTRAVENOUS | Status: AC
  Filled 2022-05-14: qty 400

## 2022-05-14 NOTE — Progress Notes (Signed)
[facility] PROGRESS NOTE   Patrick Moss DDU:[id number] DOB: 1941/01/04 DOA: 05/10/2022  PCP: [name], MD  Brief History/Interval Summary: 81 y.o. male with medical history significant for hypertension, diabetes, chronic kidney disease stage IIIa, was recently discharged from the hospital on 11/23 after being evaluated for acute/subacute stroke of right corona radiata.  At that time, he had presented with facial droop and slurred speech.  He was noted to have carotid stenosis on carotid ultrasound.  Seen by neurology with recommendations for outpatient follow-up with interventional radiology to be considered for stenting.  He was started on aspirin and Plavix. After returning home, he reported worsening of his dysphagia/dysarthria.  He noted that he had trouble maintaining his oral secretions causing him to drool out of the left side of his mouth.  Subsequently, he began noticing weakness in his left hand.  He was having trouble holding his cane and was dropping things from his left hand.  Due to development of new symptoms, he came to the ER for evaluation.  Patient was transferred over to [facility] for further evaluation. A new stroke was identified on MRI.  Consultants: Neurology.  Neurointerventional radiology.  Procedures: None  Subjective/Interval History: No acute issues or events overnight, speech continues to improve, weakness appears to be resolving.  Brother at bedside.  Assessment/Plan:  Acute stroke Patient has had recent recurrent strokes.  Recently hospitalized for same.  Has a right RCA stenosis of 60%. Presented with left hand weakness and difficulty speaking and difficulty swallowing. MRI shows new stroke. Patient transferred to [facility].  Seen by stroke service.   Neurointerventional radiology has been consulted by them to consider stenting of the right ICA pending repeat imaging 05/14/2022.  Patient was previously on aspirin and Plavix.  Has  been changed to aspirin and Brilinta.  Noted to be on rosuvastatin and Zetia. LDL was noted to be 24 when checked recently.  HbA1c 6.1. Echocardiogram showed normal systolic function of the left ventricle with grade 1 diastolic dysfunction. Seen by speech therapy.  Diet has been advanced to dysphagia 3 diet with thin liquids.  Seen by PT and OT who recommends home health. Screened by inpatient rehabilitation and not thought to be a good candidate for CIR.  They also recommend home health. Plan is for cerebral angiogram and possible intervention on 11/29.  Essential hypertension Permissive hypertension allowed.  Blood pressures are reasonably well-controlled.  Holding valsartan hydrochlorothiazide and amlodipine.  Chronic kidney disease stage IIIa Seems to be at baseline.  Monitor urine output.  Avoid nephrotoxic agents.  Recheck labs tomorrow.  Epistaxis Had mild epistaxis from right nostril overnight.  Patient is on aspirin and Brilinta.  Afrin will be prescribed.  Diabetes mellitus type 2, controlled Holding all home medications including metformin Trulicity basal insulin.  Monitor CBGs.  Continue SSI.  Oropharyngeal dysphagia Speech therapy is following.  Currently on dysphagia 3 diet.  GERD Continue PPI.  Normocytic anemia No evidence of overt bleeding.  Continue to monitor.  DVT Prophylaxis: Lovenox Code Status: Full code Family Communication: Discussed with patient Disposition Plan: Plan is for discharge home with home health when medically stable.  Status is: Inpatient Remains inpatient appropriate because: Recurrent stroke, right ICA stenosis   Medications: Scheduled:  aspirin  81 mg Oral Daily   enoxaparin (LOVENOX) injection  40 mg Subcutaneous Q24H   insulin aspart  0-15 Units Subcutaneous TID WC   oxymetazoline  1 spray Each Nare BID   pantoprazole  40  mg Oral Daily   rosuvastatin  5 mg Oral Daily   ticagrelor  90 mg Oral BID    Continuous:   [id number]:[id number] **OR** acetaminophen (TYLENOL) oral liquid 160 mg/5 mL **OR** acetaminophen, senna-docusate, sodium chloride  Antibiotics: Anti-infectives (From admission, onward)    None       Objective:  Vital Signs  Vitals:   05/13/22 1547 05/13/22 2043 05/14/22 0032 05/14/22 0415  BP: (!) 142/65 116/73 (!) 133/56 (!) 127/40  Pulse: 66 (!) 56 (!) 53 (!) 58  Resp: '20 19 16   '$ Temp: 99 F (37.2 C) 98.2 F (36.8 C) 97.8 F (36.6 C) 98 F (36.7 C)  TempSrc: Oral Oral Oral Oral  SpO2: 100% 97% 97% 98%  Weight:      Height:        Intake/Output Summary (Last 24 hours) at 05/14/2022 0800 Last data filed at 05/13/2022 1900 Gross per 24 hour  Intake 560 ml  Output --  Net 560 ml    Filed Weights   05/10/22 0404  Weight: 81 kg    General appearance: Awake alert.  In no distress Dried blood noted on the right nostril.  No active bleeding noted. Resp: Clear to auscultation bilaterally.  Normal effort Cardio: S1-S2 is normal regular.  No S3-S4.  No rubs murmurs or bruit GI: Abdomen is soft.  Nontender nondistended.  Bowel sounds are present normal.  No masses organomegaly Extremities: No edema.  All of his extremities. Less dysarthria noted.  Improved strength of the left upper extremity.  Lab Results:  Data Reviewed: I have personally reviewed following labs and reports of the imaging studies  CBC: Recent Labs  Lab 05/07/22 1258 05/10/22 0505 05/11/22 0332 05/12/22 0406  WBC 6.8 6.1 5.1 5.4  NEUTROABS 5.3 4.7  --   --   HGB 12.6* 11.8* 11.1* 11.8*  HCT 37.2* 35.2* 33.3* 34.2*  MCV 93.2 93.4 94.3 92.4  PLT 261 239 220 188     Basic Metabolic Panel: Recent Labs  Lab 05/07/22 1258 05/08/22 0447 05/10/22 0505 05/10/22 1022 05/11/22 0332 05/12/22 0406  NA 139 141 140  --  143 141  K 3.8 4.0 3.6  --  3.6 4.7  CL 105 111 109  --  111 109  CO2 25 25 21*  --  22 21*  GLUCOSE 132* 91 119*  --  78 98  BUN 24* 22 28*  --  17 23   CREATININE 1.35* 1.38* 1.47*  --  1.32* 1.36*  CALCIUM 9.8 9.3 9.5  --  9.4 9.6  MG  --   --   --  2.2  --   --      GFR: Estimated Creatinine Clearance: 43.3 mL/min (A) (by C-G formula based on SCr of 1.36 mg/dL (H)).  Liver Function Tests: Recent Labs  Lab 05/07/22 1258 05/10/22 0505  AST 24 24  ALT 32 30  ALKPHOS 73 60  BILITOT 0.8 0.7  PROT 7.6 6.9  ALBUMIN 4.1 3.9      Coagulation Profile: Recent Labs  Lab 05/10/22 0505 05/13/22 0522  INR 1.0 1.1      CBG: Recent Labs  Lab 05/13/22 1547 05/13/22 2029 05/14/22 0032 05/14/22 0415 05/14/22 0758  GLUCAP 150* 126* 118* 119* 138*      Recent Results (from the past 240 hour(s))  Urine Culture     Status: None   Collection Time: 05/07/22  7:00 PM   Specimen: Urine, Clean Catch  Result Value Ref Range  Status   Specimen Description   Final    URINE, CLEAN CATCH Performed at [facility], [address] [place], [place] [address]    Special Requests   Final    NONE Performed at [facility], [address] [place], [place] [address]    Culture   Final    NO GROWTH Performed at [facility], [place] [address], [place], [place] [address]    Report Status 05/09/2022 FINAL  Final      Radiology Studies: No results found.     LOS: 3 days   Little Ishikawa  [facility] Pager on [email or web address]  05/14/2022, 8:00 AM

## 2022-05-14 NOTE — Anesthesia Procedure Notes (Signed)
Arterial Line Insertion Start/End11/29/2023 11:45 AM, 05/14/2022 11:48 AM Performed by: Colin Benton, CRNA, CRNA  Patient location: Pre-op. Preanesthetic checklist: patient identified, IV checked, site marked, risks and benefits discussed, surgical consent, monitors and equipment checked, pre-op evaluation, timeout performed and anesthesia consent Lidocaine 1% used for infiltration Left, radial was placed Catheter size: 20 G Hand hygiene performed , maximum sterile barriers used  and Seldinger technique used Allen's test indicative of satisfactory collateral circulation Attempts: 1 Procedure performed without using ultrasound guided technique. Following insertion, dressing applied and Biopatch. Post procedure assessment: normal and unchanged  Patient tolerated the procedure well with no immediate complications.

## 2022-05-14 NOTE — Progress Notes (Signed)
Orthopedic Tech Progress Note Patient Details:  Patrick Moss 1940-07-28 [id number]  Ortho Devices Type of Ortho Device: Knee Immobilizer Ortho Device/Splint Location: RLE Ortho Device/Splint Interventions: Application   Post Interventions Patient Tolerated: Well  Patrick Moss 05/14/2022, 3:10 PM

## 2022-05-14 NOTE — Telephone Encounter (Signed)
Received notification from [employer] regarding RE-ENROLLMENT approval for TRULICITY 1.'5MG'$ /0.5ML. Patient assistance approved from 06/16/22 to 06/16/23.  Phone: [phone]

## 2022-05-14 NOTE — Progress Notes (Addendum)
STROKE TEAM PROGRESS NOTE   INTERVAL HISTORY His brother is at the beside, patient is sitting up at bedside recliner. Left facial droop improved though still present. Plan for right ICA stenting today. Patient has been NPO overnight.   Vitals:   05/14/22 1515 05/14/22 1530 05/14/22 1545 05/14/22 1600  BP: (!) 115/57 (!) 116/59 117/60 108/66  Pulse: 71 68 71 71  Resp: '19 20 16 15  '$ Temp:      TempSrc:      SpO2: 99% 96% 97% 97%  Weight:      Height:       CBC:  Recent Labs  Lab 05/10/22 0505 05/11/22 0332 05/12/22 0406 05/14/22 0744  WBC 6.1   < > 5.4 4.5  NEUTROABS 4.7  --   --  3.0  HGB 11.8*   < > 11.8* 11.8*  HCT 35.2*   < > 34.2* 33.9*  MCV 93.4   < > 92.4 91.9  PLT 239   < > 188 243   < > = values in this interval not displayed.    Basic Metabolic Panel:  Recent Labs  Lab 05/10/22 1022 05/11/22 0332 05/12/22 0406 05/14/22 0744  NA  --    < > 141 137  K  --    < > 4.7 3.6  CL  --    < > 109 107  CO2  --    < > 21* 22  GLUCOSE  --    < > 98 127*  BUN  --    < > 23 17  CREATININE  --    < > 1.36* 1.39*  CALCIUM  --    < > 9.6 9.4  MG 2.2  --   --   --    < > = values in this interval not displayed.    Lipid Panel:  Recent Labs  Lab 05/08/22 0447  CHOL 92  TRIG 144  HDL 39*  CHOLHDL 2.4  VLDL 29  LDLCALC 24    HgbA1c:  No results for input(s): "HGBA1C" in the last 168 hours.   IMAGING past 24 hours No results found.  PHYSICAL EXAM  Temp:  [97.8 F (36.6 C)-98.7 F (37.1 C)] 97.9 F (36.6 C) (11/29 1455) Pulse Rate:  [53-71] 71 (11/29 1600) Resp:  [15-20] 15 (11/29 1600) BP: (108-137)/(40-93) 108/66 (11/29 1600) SpO2:  [96 %-99 %] 97 % (11/29 1600) Arterial Line BP: (124-148)/(49-59) 124/52 (11/29 1600) Weight:  [78.9 kg] 78.9 kg (11/29 1126)  General - Well nourished, well developed, in no apparent distress  Cardiovascular - Regular rhythm and rate.  Mental Status -  Level of arousal and orientation to time, place, and person  were intact. Dysarthria, no aphasia Language including expression, naming, repetition, comprehension was assessed and found intact. Attention span and concentration were normal.  Cranial Nerves II - XII - II - Visual field intact OU. III, IV, VI - Extraocular movements intact. V - Facial sensation intact bilaterally. VII - Left facial droop present VIII - Hearing & vestibular intact bilaterally. X - Palate elevates symmetrically. XI - Chin turning & shoulder shrug intact bilaterally. XII - Tongue protrusion intact.  Motor Strength - left arm 4/5, left leg 4/5 right arm 5/5, right leg 5/5. Left grip strength 3/5.  Motor Tone - Muscle tone was assessed at the neck and appendages and was normal.  Sensory - Light touch assessed and is symmetrical.    Coordination - mild ataxia left   Gait and  Station - deferred.  ASSESSMENT/PLAN [name] is a 81 y.o. male with history of  hypertension, diabetes, chronic kidney disease stage IIIa, was recently discharged from the hospital on 11/23 after being evaluated for acute/subacute stroke of right corona radiata.  At that time, he had presented with right facial droop and slurred speech.  He was noted to have carotid stenosis on carotid ultrasound.  Seen by neurology with recommendations for outpatient follow-up with interventional radiology to be considered for stenting.  He was started on aspirin and Plavix. After returning home, he reported worsening of his dysphagia/dysarthria.  He noted that he had trouble maintaining his oral secretions causing him to drool out of the left side of his mouth. On 11/24, he began noticing weakness in his left hand.  He was having trouble holding his cane and was dropping things from his left hand.  Due to development of new symptoms, he came to the ER for evaluation.   Stroke: Acute right corona radiata ischemic infarct  Etiology:  due to right ICA stenosis/atherosclerosis CT head  1. Focal area of low  attenuation within the right corona radiata corresponding to areas of restricted diffusion on recent MRI and compatible with evolutionary changes of previously noted acute to early subacute infarcts noted on 05/07/2022. 2. Chronic small vessel ischemic disease and brain atrophy. CTA head & neck  1. Negative for large vessel occlusion. But Positive for complex plaque in the proximal Right ICA with a possible small focus of superimposed adherent thrombus (series 8, image 141 and series 4, image 113). Hemodynamically significant stenosis there estimated at 60%. And positive also for bulky bilateral calcified plaque of the Dominant Right Vertebral V4 segment with up to Severe stenosis. This is distal to the Right PICA, and the Basilar artery is patent without stenosis. 2. Additionally, mild to moderate Left ICA siphon petrous and cavernous segment atherosclerotic stenosis. MRI: New acute infarct in the right corona radiata, adjacent to the previously noted acute infarcts on 05/07/2022 2D Echo 11/22: EF 60-65%. Left ventricular diastolic parameters are consistent with Grade I diastolic dysfunction. Left atrial size was mildly dilated.  Dr. Estanislado Pandy  plan for cerebral angiogram and potential right ICA stenting today. LDL 24 HgbA1c 6.1 VTE prophylaxis - Lovenox aspirin 325 mg daily and clopidogrel 75 mg daily prior to admission, now on aspirin 325 mg daily and Brilinta (ticagrelor) 90 mg bid per IR  Therapy recommendations:  HH PT/OT  Disposition:  pending  Hypertension Home meds:  norvasc '5mg'$ , diovan 320-'25mg'$  Stable Avoid low BP Long-term BP goal normotensive  Hyperlipidemia Home meds:  zetia and crestor '5mg'$  LDL 24, goal < 70 Continue Crestor '5mg'$  Continue statin at discharge  Diabetes type II Controlled Home meds:  insulin, metformin HgbA1c 6.1, goal < 7.0 CBGs SSI  Other Stroke Risk Factors Advanced Age >/= 47  Hx stroke/TIA Coronary artery disease  Other Active  Problems BPH CKD3, creatinine 1.35-1.38-1.47-1.36- 1.39  Hospital day # 3  -- Patrick Moss, AGACNP-BC [facility] [phone]  05/14/2022 4:32 PM  ATTENDING NOTE: I reviewed above note and agree with the assessment and plan. Pt was seen and examined.   Patient seen at 4 N. ICU after stent placement.  Patient lying bed, awake alert, orientated x3, still has left mild facial droop, moving all extremities equally.  Brother and son at bedside.  Neuro stable.  On heparin IV and DAPT aspirin and Brilinta.  BP goal 120-140 about 24 hours post IR.  Continue current management,  follow-up in a.m.  For detailed assessment and plan, please refer to above/below as I have made changes wherever appropriate.   Rosalin Hawking, MD PhD Stroke Neurology 05/14/2022 7:27 PM    To contact Stroke Continuity provider, please refer to [email or web address] After hours, contact General Neurology

## 2022-05-14 NOTE — H&P (Signed)
Referring Physician(s): Rosalin Hawking   Supervising Physician: Luanne Bras  Patient Status:  [facility] - In-pt  Chief Complaint:  complex plaque in the proximal Right ICA   Brief History:  Patrick Moss is a 81 y.o. male with PMHs of HTN, HLD, CKD stage II, BPH, prostates CA, DM type 2, who presented to the hospital on 11/21 secondary to right facial droop and slurred speech.    He was started on aspirin and Plavix and aspirin and discharged from the hospital on 11/23 after being evaluated for acute/subacute stroke.   After returning home he had worsening of his dysphagia/dysarthria and  trouble maintaining his oral secretions causing him to drool out of the left side of his mouth.  He began noticing weakness in his left hand which prompted patient to go to ED again.   CT/CTA head/neck showed: Complex plaque in the proximal Right ICA with a possible small focus of superimposed adherent thrombus. Hemodynamically significant stenosis there estimated at 60%.   And positive also for bulky bilateral calcified plaque of the Dominant Right Vertebral V4 segment with severe stenosis.    NIR was consulted for  cerebral angiogram and possible R ICA stent placement.   The patient has had a H&P performed within the last 30 days, all history, medications, and exam have been reviewed. The patient denies any interval changes since the H&P  Subjective:  No complaints. Sitting up in chair. Confirms NPO status.  Allergies: Septra [sulfamethoxazole-trimethoprim]  Medications: Prior to Admission medications   Medication Sig Start Date End Date Taking? Authorizing Provider  amLODipine (NORVASC) 5 MG tablet Take 1 tablet (5 mg total) by mouth daily. 04/07/22  Yes [name], MD  aspirin 325 MG tablet Take 1 tablet (325 mg total) by mouth daily. 05/09/22  Yes Barton Dubois, MD  calcium carbonate (OSCAL) 1500 (600 Ca) MG TABS tablet Take 1,500 mg by mouth 2 (two) times daily with a  meal.   Yes [provider]  celecoxib (CELEBREX) 400 MG capsule Take 1 capsule (400 mg total) by mouth daily as needed for pain (severe pain). With food 05/08/22  Yes Barton Dubois, MD  Cholecalciferol (VITAMIN D3) 50 MCG (2000 UT) TABS Take 1 capsule by mouth daily.   Yes [provider]  clopidogrel (PLAVIX) 75 MG tablet Take 1 tablet (75 mg total) by mouth daily. 05/09/22  Yes Barton Dubois, MD  diclofenac Sodium (VOLTAREN) 1 % GEL Apply 4 g topically 4 (four) times daily as needed. For the knee 05/08/22  Yes Barton Dubois, MD  Dulaglutide (TRULICITY) 1.5 TZ/0.0FV SOPN Inject 1.5 mg into the skin once a week. Patient taking differently: Inject 1.5 mg into the skin once a week. Saturday's 05/15/21  Yes [name], [name], MD  ezetimibe (ZETIA) 10 MG tablet Take 1 tablet (10 mg total) by mouth daily. 05/08/22 05/08/23 Yes Barton Dubois, MD  Insulin Glargine [facility]) 100 UNIT/ML Inject 30 Units into the skin daily. Patient taking differently: Inject 30 Units into the skin at bedtime. 05/15/21  Yes [name], [name], MD  metFORMIN (GLUCOPHAGE-XR) 500 MG 24 hr tablet TAKE 2 TABLETS EVERY DAY AFTER BREAKFAST Patient taking differently: 1,000 mg daily with breakfast. 01/13/22  Yes [name], [name], MD  omega-3 fish oil (MAXEPA) 1000 MG CAPS capsule Take 1 capsule by mouth daily. 05/06/21  Yes [provider]  pantoprazole (PROTONIX) 40 MG tablet Take 1 tablet (40 mg total) by mouth daily. 05/08/22 05/08/23 Yes Barton Dubois, MD  rosuvastatin (CRESTOR) 5  MG tablet Take 1 tablet (5 mg total) by mouth daily. 04/07/22  Yes [name], [name], MD  valsartan-hydrochlorothiazide (DIOVAN-HCT) 320-25 MG tablet Take 1 tablet by mouth daily. 04/07/22  Yes [name], [name], MD  B-D ULTRAFINE III SHORT PEN 31G X 8 MM MISC USE  1  PEN  NEEDLE AS DIRECTED 01/14/17   Nida, Marella Chimes, MD  glucose blood (TRUE METRIX BLOOD GLUCOSE TEST) test strip TEST BLOOD SUGAR TWO TO THREE TIMES DAILY Dx  E11.65 07/03/21   [name], MD     Vital Signs: BP 137/76 (BP Location: Right Arm)   Pulse 66   Temp 97.8 F (36.6 C) (Oral)   Resp 16   Ht '5\' 6"'$  (1.676 m)   Wt 178 lb 9.2 oz (81 kg)   SpO2 98%   BMI 28.82 kg/m   Physical Exam Vitals reviewed.  Constitutional:      Appearance: Normal appearance.  HENT:     Head: Normocephalic and atraumatic.  Eyes:     Extraocular Movements: Extraocular movements intact.  Cardiovascular:     Rate and Rhythm: Normal rate and regular rhythm.  Pulmonary:     Effort: Pulmonary effort is normal. No respiratory distress.     Breath sounds: Normal breath sounds.  Abdominal:     General: There is no distension.     Palpations: Abdomen is soft.     Tenderness: There is no abdominal tenderness.  Musculoskeletal:        General: Normal range of motion.     Cervical back: Normal range of motion.  Skin:    General: Skin is warm and dry.  Neurological:     General: No focal deficit present.     Mental Status: He is alert and oriented to person, place, and time.  Psychiatric:        Mood and Affect: Mood normal.        Behavior: Behavior normal.        Thought Content: Thought content normal.        Judgment: Judgment normal.     Imaging: MR BRAIN WO CONTRAST  Result Date: 05/11/2022 CLINICAL DATA:  Stroke suspected EXAM: MRI HEAD WITHOUT CONTRAST TECHNIQUE: Multiplanar, multiecho pulse sequences of the brain and surrounding structures were obtained without intravenous contrast. COMPARISON:  05/07/2022 MRI head FINDINGS: Brain: Restricted diffusion with ADC correlate in the right corona radiata, adjacent to the areas of restricted diffusion seen on the prior MRI. The new focus measures up to 13 x 16 mm in the axial plane (AP x TR) (series 2, image 33). This is associated with increased T2 signal and favored to represent an acute infarct. No acute hemorrhage, mass, mass effect, or midline shift. No hydrocephalus or extra-axial collection.  Scattered T2 hyperintense signal in the periventricular white matter, likely the sequela of mild chronic small vessel ischemic disease. Vascular: Normal arterial flow voids. Skull and upper cervical spine: Normal marrow signal. Sinuses/Orbits: Clear paranasal sinuses. Status post bilateral lens replacements. Other: The mastoids are well aerated. IMPRESSION: New acute infarct in the right corona radiata, adjacent to the previously noted acute infarcts on 05/07/2022. These results will be called to the ordering clinician or representative by the Radiologist Assistant, and communication documented in the PACS or [employer]. Electronically Signed   By: Merilyn Baba M.D.   On: 05/11/2022 02:47    Labs:  CBC: Recent Labs    05/10/22 0505 05/11/22 0332 05/12/22 0406 05/14/22 0744  WBC 6.1 5.1 5.4 4.5  HGB 11.8* 11.1* 11.8* 11.8*  HCT 35.2* 33.3* 34.2* 33.9*  PLT 239 220 188 243    COAGS: Recent Labs    05/10/22 0505 05/13/22 0522  INR 1.0 1.1    BMP: Recent Labs    05/10/22 0505 05/11/22 0332 05/12/22 0406 05/14/22 0744  NA 140 143 141 137  K 3.6 3.6 4.7 3.6  CL 109 111 109 107  CO2 21* 22 21* 22  GLUCOSE 119* 78 98 127*  BUN 28* '17 23 17  '$ CALCIUM 9.5 9.4 9.6 9.4  CREATININE 1.47* 1.32* 1.36* 1.39*  GFRNONAA 48* 55* 53* 51*    LIVER FUNCTION TESTS: Recent Labs    08/27/21 1321 01/09/22 0933 05/07/22 1258 05/10/22 0505  BILITOT 0.5 0.5 0.8 0.7  AST '17 15 24 24  '$ ALT 17 19 32 30  ALKPHOS 104 94 73 60  PROT 7.0 7.0 7.6 6.9  ALBUMIN 4.6 4.6 4.1 3.9    Assessment and Plan:  ecent hx of CVA x 2 presents with plaque in R ICA who is in need of cerebral angiogram and possible intervention including stent placement.    Risks and benefits of cerebral angiogram with intervention were discussed with the patient including, but not limited to bleeding, infection, vascular injury, contrast induced renal failure, stroke or even death.   This interventional procedure  involves the use of X-rays and because of the nature of the planned procedure, it is possible that we will have prolonged use of X-ray fluoroscopy.   Potential radiation risks to you include (but are not limited to) the following: - A slightly elevated risk for cancer      several years later in life. This risk is typically less than 0.5% percent. This risk is low in comparison to the normal incidence of human cancer, which is 33% for women and 50% for men according to the [place]. - Radiation induced injury can include skin redness, resembling a rash, tissue breakdown / ulcers and hair loss (which can be temporary or permanent).    The likelihood of either of these occurring depends on the difficulty of the procedure and whether you are sensitive to radiation due to previous procedures, disease, or genetic conditions.    IF your procedure requires a prolonged use of radiation, you will be notified and given written instructions for further action.  It is your responsibility to monitor the irradiated area for the 2 weeks following the procedure and to notify your physician if you are concerned that you have suffered a radiation induced injury.     All of the patient's questions were answered, patient is agreeable to proceed.   Consent signed and in chart.     Electronically Signed: Murrell Redden, PA-C 05/14/2022, 10:19 AM    I spent a total of 15 Minutes at the the patient's bedside AND on the patient's hospital floor or unit, greater than 50% of which was counseling/coordinating care for update H&P

## 2022-05-14 NOTE — Procedures (Signed)
INR. Status post right vertebral artery, and right common carotid arteriograms. Right CFA approach. Status post revascularization of symptomatic stenosis of the proximal right internal carotid artery secondary to a soft atherosclerotic plaque using  stent assisted angioplasty, and distal protection. Post CT of brain no hemorrhagic complications noted. 7 French exoseal closure  device deployed at the right groin puncture site for hemostasis, in addition to manual compression using quick clot. Distal pulses dopplerable in both feet unchanged. Patient extubated.   Follows simple commands appropriately.  Moves all fours with a slight left-sided pronation drift. Pupils to 3 mm round sluggish bilaterally.  No change in the mild left facial droop. Arlean Hopping MD.

## 2022-05-14 NOTE — Anesthesia Procedure Notes (Signed)
Procedure Name: Intubation Date/Time: 05/14/2022 12:57 PM  Performed by: Anastasio Auerbach, CRNAPre-anesthesia Checklist: Patient identified, Emergency Drugs available, Suction available and Patient being monitored Patient Re-evaluated:Patient Re-evaluated prior to induction Oxygen Delivery Method: Circle system utilized Preoxygenation: Pre-oxygenation with 100% oxygen Induction Type: IV induction Ventilation: Mask ventilation without difficulty Laryngoscope Size: Mac and 3 Grade View: Grade I Tube type: Oral Number of attempts: 1 Airway Equipment and Method: Stylet and Oral airway Placement Confirmation: ETT inserted through vocal cords under direct vision, positive ETCO2 and breath sounds checked- equal and bilateral Secured at: 23 cm Tube secured with: Tape Dental Injury: Teeth and Oropharynx as per pre-operative assessment

## 2022-05-14 NOTE — Progress Notes (Signed)
Patient s/p right ICA stent placement today in NIR with Dr. Estanislado Pandy.  Patient seen in PACU this afternoon, he states he feels great and has no complaints. He specifically denies headache, n/v, vision changes or speech changes. Per RN no issues with puncture site, remains on Cleviprex and heparin gtt.  Alert, awake, and oriented  x 3 Speech intermittently slurred similar to pre-procedure, comprehension in tact, answers questions/follows commands appropriately PERRL bilaterally EOMs without nystagmus or subjective diplopia. Visual fields in tact Left facial droop similar to pre-procedure f Tongue midline Motor power 5/5 all 4 extremities Mild left pronator drift similar to pre-procedure. Fine motor and coordination in tact Distal pulses palpable bilaterally Right CFA puncture site clean, dry, dressed without bleeding, soft, non tender to palpation.  Plan: - Admit to ICU overnight on hospitalist service for frequent neuro/CFA puncture site checks - Bedrest until assessed by NIR tomorrow AM - Right leg straight until midnight - Continue heparin gtt  - Continue Cleviprex, goal SBP 120-140 per arterial line if good waveform - Brilinta 90 mg BID + ASA 81 mg QD - Advance diet as tolerated - Further medications per hospitalist service  NIR will follow up in AM. Please call questions or concerns.  Candiss Norse, PA-C

## 2022-05-14 NOTE — Plan of Care (Signed)

## 2022-05-14 NOTE — Anesthesia Preprocedure Evaluation (Signed)
Anesthesia Evaluation  Patient identified by MRN, date of birth, ID band Patient awake    Reviewed: Allergy & Precautions, NPO status , Patient's Chart, lab work & pertinent test results  History of Anesthesia Complications (+) history of anesthetic complications  Airway Mallampati: III  TM Distance: >3 FB Neck ROM: Full    Dental  (+) Dental Advisory Given, Poor Dentition, Missing,    Pulmonary neg pulmonary ROS, neg sleep apnea, neg recent URI   breath sounds clear to auscultation       Cardiovascular hypertension, Pt. on medications (-) angina (-) Past MI  Rhythm:Regular  1. Left ventricular ejection fraction, by estimation, is 60 to 65%. The  left ventricle has normal function. The left ventricle has no regional  wall motion abnormalities. Left ventricular diastolic parameters are  consistent with Grade I diastolic  dysfunction (impaired relaxation).   2. RV-RA gradient 24 mmHg suggesting normal estimated RVSP presuming  normal CVP. Right ventricular systolic function is normal. The right  ventricular size is normal.   3. Left atrial size was mildly dilated.   4. The mitral valve is grossly normal. Trivial mitral valve  regurgitation.   5. The aortic valve is tricuspid. Aortic valve regurgitation is not  visualized. Aortic valve mean gradient measures 2.0 mmHg.   6. Unable to estimate CVP.   7. Hypermobile interatrial septum without obvious shunting. Consider  saline contrast bubble study for further investigation.      Neuro/Psych CVA, Residual Symptoms  negative psych ROS   GI/Hepatic Neg liver ROS,GERD  ,,  Endo/Other  diabetes    Renal/GU CRFRenal diseaseLab Results      Component                Value               Date                      CREATININE               1.39 (H)            05/14/2022                Musculoskeletal  (+) Arthritis ,    Abdominal   Peds  Hematology  (+) Blood dyscrasia,  anemia Lab Results      Component                Value               Date                      WBC                      4.5                 05/14/2022                HGB                      11.8 (L)            05/14/2022                HCT                      33.9 (L)  05/14/2022                MCV                      91.9                05/14/2022                PLT                      243                 05/14/2022              Anesthesia Other Findings   Reproductive/Obstetrics                              Anesthesia Physical Anesthesia Plan  ASA: 3  Anesthesia Plan: General   Post-op Pain Management: Minimal or no pain anticipated   Induction: Intravenous  PONV Risk Score and Plan: 2 and Ondansetron, Dexamethasone, Propofol infusion and TIVA  Airway Management Planned: Oral ETT  Additional Equipment: Arterial line  Intra-op Plan:   Post-operative Plan: Extubation in OR  Informed Consent: I have reviewed the patients History and Physical, chart, labs and discussed the procedure including the risks, benefits and alternatives for the proposed anesthesia with the patient or authorized representative who has indicated his/her understanding and acceptance.     Dental advisory given  Plan Discussed with: CRNA  Anesthesia Plan Comments: (Left facial droop)         Anesthesia Quick Evaluation

## 2022-05-14 NOTE — Care Management Important Message (Signed)
Important Message  Patient Details  Name: Patrick Moss MRN: [id number] Date of Birth: 07/07/1940   Medicare Important Message Given:  Yes     Yailine Ballard Montine Circle 05/14/2022, 11:19 AM

## 2022-05-14 NOTE — Progress Notes (Signed)
ANTICOAGULATION CONSULT NOTE Post Interventional Neuroradiology Procedure Pharmacy Consult for heparin Indication:  Post Interventional Neuroradiology Procedure  Heparin Dosing Weight: 78.9 kg  Labs: Recent Labs    05/12/22 0406 05/13/22 0522 05/14/22 0744  HGB 11.8*  --  11.8*  HCT 34.2*  --  33.9*  PLT 188  --  243  LABPROT  --  14.2  --   INR  --  1.1  --   CREATININE 1.36*  --  1.39*    Assessment: 38 yom presenting with complex plaque in proximal R ICA, now s/p cerebral angiogram with R ICA stent placement 11/29. Pharmacy consulted to dose heparin per post-interventional neuroradiology procedure protocol. Patient already started on heparin at 500 units/hr in PACU. Patient is not on anticoagulation PTA. CBC stable. No bleed issues reported.  Goal of Therapy:  Heparin level 0.1-0.25 units/ml Monitor platelets by anticoagulation protocol: Yes   Plan:  Adjust heparin to 650 units/hr (8 units/kg/hr) per protocol Check 8hr heparin level Monitor CBC, s/sx bleeding Heparin off at 0800 on 11/30 per protocol   Arturo Morton, PharmD, BCPS Please check AMION for all [facility] contact numbers Clinical Pharmacist 05/14/2022 3:13 PM

## 2022-05-14 NOTE — Progress Notes (Signed)
OT Cancellation Note  Patient Details Name: SHO SALGUERO MRN: [id number] DOB: 1940-09-21   Cancelled Treatment:    Reason Eval/Treat Not Completed: Patient at procedure or test/ unavailable. Will return as schedule allows.   Elder Cyphers, OTR/L [facility] Office: [phone]   Magnus Ivan 05/14/2022, 2:18 PM

## 2022-05-14 NOTE — Transfer of Care (Signed)
Immediate Anesthesia Transfer of Care Note  Patient: [name]  Procedure(s) Performed: R ICA stent  Patient Location: PACU  Anesthesia Type:General  Level of Consciousness: awake, alert , and oriented  Airway & Oxygen Therapy: Patient Spontanous Breathing and Patient connected to nasal cannula oxygen  Post-op Assessment: Report given to RN, Post -op Vital signs reviewed and stable, Patient moving all extremities X 4, and Patient able to stick tongue midline  Post vital signs: Reviewed and stable  Last Vitals:  Vitals Value Taken Time  BP 128/58 05/14/22 1452  Temp    Pulse 60 05/14/22 1459  Resp 17 05/14/22 1459  SpO2 99 % 05/14/22 1459  Vitals shown include unvalidated device data.  Last Pain:  Vitals:   05/14/22 1130  TempSrc:   PainSc: 0-No pain         Complications: No notable events documented.

## 2022-05-14 NOTE — Sedation Documentation (Addendum)
Right femoral sheath removed. 27f exoseal closure device deployed to right femoral artery, manual pressure being held at groin site by IR tech.

## 2022-05-14 NOTE — Progress Notes (Signed)
Physical Therapy Treatment Patient Details Name: Patrick Moss MRN: [id number] DOB: [birth year] Today's Date: 05/14/2022   History of Present Illness 81 y.o. male who presents 05/10/22 with left hand weakness x 3 days. NIHSS=4; MRI  acute infarct in the right corona radiata, adjacent to the  previously noted acute infarcts on 05/07/2022.   PMH significant for BPH, CKD 3A, hyperlipidemia, hypertension, osteoarthritis, diabetes type 2 on insulin, prostate malignancy    PT Comments    Pt with continued progress towards acute goals. Pt progressing ambulation with cane in LUE with increased distance and increased gait speed. Pt without LOB and good stability throughout noted. Pt able to dual task during gait, counting backwards by 5s and demonstrating stair negotiation with min guard for safety with no overt LOB noted. Current plan remains appropriate to address deficits and maximize functional independence. Pt continues to benefit from skilled PT services to progress toward functional mobility goals.    Recommendations for follow up therapy are one component of a multi-disciplinary discharge planning process, led by the attending physician.  Recommendations may be updated based on patient status, additional functional criteria and insurance authorization.  Follow Up Recommendations  Home health PT     Assistance Recommended at Discharge PRN  Patient can return home with the following Help with stairs or ramp for entrance;Assist for transportation   Equipment Recommendations  None recommended by PT    Recommendations for Other Services       Precautions / Restrictions Precautions Precautions: Fall Restrictions Weight Bearing Restrictions: No     Mobility  Bed Mobility Overal bed mobility: Needs Assistance             General bed mobility comments: OOB in recliner pre and post session    Transfers Overall transfer level: Needs assistance Equipment used: Straight  cane Transfers: Sit to/from Stand Sit to Stand: Min guard           General transfer comment: min guard for safety only    Ambulation/Gait Ambulation/Gait assistance: Min guard Gait Distance (Feet): 400 Feet Assistive device: Straight cane Gait Pattern/deviations: Step-through pattern, Decreased step length - right, Decreased step length - left, Decreased stance time - right, Decreased weight shift to right, Trunk flexed Gait velocity: slightly decr     General Gait Details: pt sequences with cane correctly >90% of the time (uses in his left hand due to chronic rt knee pain); decr wt-shift over RLE;   Stairs Stairs: Yes Stairs assistance: Min guard Stair Management: One rail Left, Step to pattern Number of Stairs: 4 General stair comments: up/down 4 steps without fault, mild unsteadiness but no LOB   Wheelchair Mobility    Modified Rankin (Stroke Patients Only) Modified Rankin (Stroke Patients Only) Pre-Morbid Rankin Score: Moderate disability Modified Rankin: Moderately severe disability     Balance Overall balance assessment: Needs assistance Sitting-balance support: No upper extremity supported, Feet supported Sitting balance-Leahy Scale: Good Sitting balance - Comments: can tolerate strength testing with either leg without imbalance   Standing balance support: Single extremity supported Standing balance-Leahy Scale: Fair Standing balance comment: can static stand for grooming at sink without UE support                            Cognition Arousal/Alertness: Awake/alert Behavior During Therapy: WFL for tasks assessed/performed Overall Cognitive Status: Impaired/Different from baseline Area of Impairment: Memory, Safety/judgement, Awareness  Current Attention Level: Sustained Memory: Decreased short-term memory Following Commands: Follows one step commands consistently, Follows one step commands with increased time,  Follows multi-step commands inconsistently Safety/Judgement: Decreased awareness of deficits Awareness: Emergent Problem Solving: Slow processing, Difficulty sequencing, Requires verbal cues General Comments: Pt improved with cognition today - not back to baseline PTA, but good problem solving for functional tasks. able to count backwards from 100 by 5s with 50% accuracey        Exercises      General Comments General comments (skin integrity, edema, etc.): brother present      Pertinent Vitals/Pain Pain Assessment Pain Assessment: Faces Faces Pain Scale: Hurts a little bit Pain Location: Rt lateral knee (chronic and why he uses cane) Pain Descriptors / Indicators: Discomfort Pain Intervention(s): Monitored during session, Limited activity within patient's tolerance    Home Living                          Prior Function            PT Goals (current goals can now be found in the care plan section) Acute Rehab PT Goals Patient Stated Goal: to get home PT Goal Formulation: With patient Time For Goal Achievement: 05/25/22 Progress towards PT goals: Progressing toward goals    Frequency    Min 3X/week      PT Plan Current plan remains appropriate    Co-evaluation              AM-PAC PT "6 Clicks" Mobility   Outcome Measure  Help needed turning from your back to your side while in a flat bed without using bedrails?: A Little Help needed moving from lying on your back to sitting on the side of a flat bed without using bedrails?: A Little Help needed moving to and from a bed to a chair (including a wheelchair)?: A Little Help needed standing up from a chair using your arms (e.g., wheelchair or bedside chair)?: A Little Help needed to walk in hospital room?: A Little Help needed climbing 3-5 steps with a railing? : A Little 6 Click Score: 18    End of Session Equipment Utilized During Treatment: Gait belt Activity Tolerance: Patient tolerated  treatment well Patient left: in chair;with call bell/phone within reach;with chair alarm set Nurse Communication: Mobility status PT Visit Diagnosis: Other abnormalities of gait and mobility (R26.89)     Time: 1037-1050 PT Time Calculation (min) (ACUTE ONLY): 13 min  Charges:  $Gait Training: 8-22 mins                     Audry Riles. PTA Acute Rehabilitation Services Office: [place] 05/14/2022, 11:02 AM

## 2022-05-15 ENCOUNTER — Other Ambulatory Visit: Payer: Self-pay | Admitting: Neurology

## 2022-05-15 ENCOUNTER — Other Ambulatory Visit (HOSPITAL_COMMUNITY): Payer: Self-pay

## 2022-05-15 ENCOUNTER — Encounter (HOSPITAL_COMMUNITY): Payer: Self-pay | Admitting: Interventional Radiology

## 2022-05-15 DIAGNOSIS — I639 Cerebral infarction, unspecified: Secondary | ICD-10-CM | POA: Diagnosis not present

## 2022-05-15 DIAGNOSIS — I6521 Occlusion and stenosis of right carotid artery: Secondary | ICD-10-CM | POA: Diagnosis not present

## 2022-05-15 DIAGNOSIS — I63231 Cerebral infarction due to unspecified occlusion or stenosis of right carotid arteries: Secondary | ICD-10-CM | POA: Diagnosis not present

## 2022-05-15 LAB — CBC WITH DIFFERENTIAL/PLATELET
Abs Immature Granulocytes: 0.02 10*3/uL (ref 0.00–0.07)
Basophils Absolute: 0 10*3/uL (ref 0.0–0.1)
Basophils Relative: 0 %
Eosinophils Absolute: 0 10*3/uL (ref 0.0–0.5)
Eosinophils Relative: 0 %
HCT: 30.6 % — ABNORMAL LOW (ref 39.0–52.0)
Hemoglobin: 10.5 g/dL — ABNORMAL LOW (ref 13.0–17.0)
Immature Granulocytes: 0 %
Lymphocytes Relative: 6 %
Lymphs Abs: 0.4 10*3/uL — ABNORMAL LOW (ref 0.7–4.0)
MCH: 32.1 pg (ref 26.0–34.0)
MCHC: 34.3 g/dL (ref 30.0–36.0)
MCV: 93.6 fL (ref 80.0–100.0)
Monocytes Absolute: 0.1 10*3/uL (ref 0.1–1.0)
Monocytes Relative: 2 %
Neutro Abs: 5.8 10*3/uL (ref 1.7–7.7)
Neutrophils Relative %: 92 %
Platelets: 228 10*3/uL (ref 150–400)
RBC: 3.27 MIL/uL — ABNORMAL LOW (ref 4.22–5.81)
RDW: 14.2 % (ref 11.5–15.5)
WBC: 6.3 10*3/uL (ref 4.0–10.5)
nRBC: 0 % (ref 0.0–0.2)

## 2022-05-15 LAB — HEPARIN LEVEL (UNFRACTIONATED): Heparin Unfractionated: 0.33 IU/mL (ref 0.30–0.70)

## 2022-05-15 LAB — GLUCOSE, CAPILLARY
Glucose-Capillary: 128 mg/dL — ABNORMAL HIGH (ref 70–99)
Glucose-Capillary: 186 mg/dL — ABNORMAL HIGH (ref 70–99)

## 2022-05-15 LAB — BASIC METABOLIC PANEL
Anion gap: 11 (ref 5–15)
BUN: 19 mg/dL (ref 8–23)
CO2: 21 mmol/L — ABNORMAL LOW (ref 22–32)
Calcium: 9.5 mg/dL (ref 8.9–10.3)
Chloride: 111 mmol/L (ref 98–111)
Creatinine, Ser: 1.41 mg/dL — ABNORMAL HIGH (ref 0.61–1.24)
GFR, Estimated: 50 mL/min — ABNORMAL LOW (ref >60)
Glucose, Bld: 163 mg/dL — ABNORMAL HIGH (ref 70–99)
Potassium: 4.4 mmol/L (ref 3.5–5.1)
Sodium: 143 mmol/L (ref 135–145)

## 2022-05-15 MED ORDER — ASPIRIN 81 MG PO CHEW: 81.0000 mg | CHEWABLE_TABLET | Freq: Every day | ORAL | 11 refills | Status: DC

## 2022-05-15 MED ORDER — TICAGRELOR 90 MG PO TABS: 90.0000 mg | ORAL_TABLET | Freq: Two times a day (BID) | ORAL | 2 refills | Status: DC

## 2022-05-15 MED ORDER — TICAGRELOR 90 MG PO TABS
90.0000 mg | ORAL_TABLET | Freq: Two times a day (BID) | ORAL | 2 refills | Status: DC
  Filled 2022-05-15: qty 60, 30d supply, fill #0

## 2022-05-15 MED ORDER — HEPARIN (PORCINE) 25000 UT/250ML-% IV SOLN: 550.0000 [IU]/h | INTRAVENOUS | Status: AC

## 2022-05-15 NOTE — Progress Notes (Signed)
Supervising Physician: Luanne Bras  Patient Status:  [facility] - In-pt  Chief Complaint:  1 day s/p revascularization of symptomatic stenosis of the proximal right internal carotid artery   Subjective:  Patient lying bed conversing with nurse at bedside.   RN says he is starting nectar-thick diet today and stepdown from ICU  Allergies: Septra [sulfamethoxazole-trimethoprim]  Medications: Prior to Admission medications   Medication Sig Start Date End Date Taking? Authorizing Provider  amLODipine (NORVASC) 5 MG tablet Take 1 tablet (5 mg total) by mouth daily. 04/07/22  Yes [name], MD  aspirin 325 MG tablet Take 1 tablet (325 mg total) by mouth daily. 05/09/22  Yes Barton Dubois, MD  calcium carbonate (OSCAL) 1500 (600 Ca) MG TABS tablet Take 1,500 mg by mouth 2 (two) times daily with a meal.   Yes [provider]  celecoxib (CELEBREX) 400 MG capsule Take 1 capsule (400 mg total) by mouth daily as needed for pain (severe pain). With food 05/08/22  Yes Barton Dubois, MD  Cholecalciferol (VITAMIN D3) 50 MCG (2000 UT) TABS Take 1 capsule by mouth daily.   Yes [provider]  clopidogrel (PLAVIX) 75 MG tablet Take 1 tablet (75 mg total) by mouth daily. 05/09/22  Yes Barton Dubois, MD  diclofenac Sodium (VOLTAREN) 1 % GEL Apply 4 g topically 4 (four) times daily as needed. For the knee 05/08/22  Yes Barton Dubois, MD  Dulaglutide (TRULICITY) 1.5 VO/1.6WV SOPN Inject 1.5 mg into the skin once a week. Patient taking differently: Inject 1.5 mg into the skin once a week. Saturday's 05/15/21  Yes [name], [name], MD  ezetimibe (ZETIA) 10 MG tablet Take 1 tablet (10 mg total) by mouth daily. 05/08/22 05/08/23 Yes Barton Dubois, MD  Insulin Glargine [facility]) 100 UNIT/ML Inject 30 Units into the skin daily. Patient taking differently: Inject 30 Units into the skin at bedtime. 05/15/21  Yes [name], [name], MD  metFORMIN (GLUCOPHAGE-XR) 500 MG 24 hr  tablet TAKE 2 TABLETS EVERY DAY AFTER BREAKFAST Patient taking differently: 1,000 mg daily with breakfast. 01/13/22  Yes [name], [name], MD  omega-3 fish oil (MAXEPA) 1000 MG CAPS capsule Take 1 capsule by mouth daily. 05/06/21  Yes [provider]  pantoprazole (PROTONIX) 40 MG tablet Take 1 tablet (40 mg total) by mouth daily. 05/08/22 05/08/23 Yes Barton Dubois, MD  rosuvastatin (CRESTOR) 5 MG tablet Take 1 tablet (5 mg total) by mouth daily. 04/07/22  Yes [name], [name], MD  valsartan-hydrochlorothiazide (DIOVAN-HCT) 320-25 MG tablet Take 1 tablet by mouth daily. 04/07/22  Yes [name], [name], MD  B-D ULTRAFINE III SHORT PEN 31G X 8 MM MISC USE  1  PEN  NEEDLE AS DIRECTED 01/14/17   Nida, Marella Chimes, MD  glucose blood (TRUE METRIX BLOOD GLUCOSE TEST) test strip TEST BLOOD SUGAR TWO TO THREE TIMES DAILY Dx E11.65 07/03/21   [name], MD     Vital Signs: BP 126/78 (BP Location: Left Arm)   Pulse 71   Temp 97.6 F (36.4 C) (Oral)   Resp 16   Ht '5\' 6"'$  (1.676 m)   Wt 174 lb (78.9 kg)   SpO2 98%   BMI 28.08 kg/m   Physical Exam Constitutional:      Appearance: He is ill-appearing.  HENT:     Head: Normocephalic and atraumatic.     Mouth/Throat:     Pharynx: Oropharynx is clear.  Eyes:     Conjunctiva/sclera: Conjunctivae normal.  Cardiovascular:     Rate and Rhythm:  Normal rate.  Pulmonary:     Effort: Pulmonary effort is normal. No respiratory distress.  Skin:    General: Skin is warm and dry.  Neurological:     General: No focal deficit present.     Mental Status: He is alert and oriented to person, place, and time.     Cranial Nerves: Dysarthria and facial asymmetry present.     Motor: Weakness present.     Comments: Mild left deviation of tongue 4/5 grip strength, leg raise and arm raise     Imaging: No results found.  Labs:  CBC: Recent Labs    05/11/22 0332 05/12/22 0406 05/14/22 0744 05/15/22 0054  WBC 5.1 5.4 4.5 6.3  HGB 11.1* 11.8*  11.8* 10.5*  HCT 33.3* 34.2* 33.9* 30.6*  PLT 220 188 243 228    COAGS: Recent Labs    05/10/22 0505 05/13/22 0522  INR 1.0 1.1    BMP: Recent Labs    05/11/22 0332 05/12/22 0406 05/14/22 0744 05/15/22 0054  NA 143 141 137 143  K 3.6 4.7 3.6 4.4  CL 111 109 107 111  CO2 22 21* 22 21*  GLUCOSE 78 98 127* 163*  BUN '17 23 17 19  '$ CALCIUM 9.4 9.6 9.4 9.5  CREATININE 1.32* 1.36* 1.39* 1.41*  GFRNONAA 55* 53* 51* 50*    LIVER FUNCTION TESTS: Recent Labs    08/27/21 1321 01/09/22 0933 05/07/22 1258 05/10/22 0505  BILITOT 0.5 0.5 0.8 0.7  AST '17 15 24 24  '$ ALT 17 19 32 30  ALKPHOS 104 94 73 60  PROT 7.0 7.0 7.6 6.9  ALBUMIN 4.6 4.6 4.1 3.9    Assessment and Plan:  Mr. Shober is 81 year old male who came to IR for stroke symptoms on 11/29.  The proximal right internal carotid artery was found to be stenosed and revascularization was achieved by Dr. Estanislado Pandy.  The patient was successfully extubated without any additional deficits found after procedure.  He had an uneventful night in NeuroICU and is examined today by Dr. Estanislado Pandy.  His dysarthria, motor function, and strength are improving.  His CFA access site is soft and non-tender without evidence of hematoma or pseudoaneurysm.  He is to remain on ASA and Brillinta as he transitions to hospitalist service and at discharge.  A follow up clinic appointment with Dr. Estanislado Pandy will be arranged with schedulers in approximately two weeks.     Electronically Signed: Pasty Spillers, PA 05/15/2022, 9:06 AM   I spent a total of 25 Minutes at the the patient's bedside AND on the patient's hospital floor or unit, greater than 50% of which was counseling/coordinating care for post cerebral angiogram with revascularization.

## 2022-05-15 NOTE — Progress Notes (Signed)
[place] to RN to place floor mats both sides of bed per safety plan

## 2022-05-15 NOTE — Progress Notes (Signed)
STROKE TEAM PROGRESS NOTE   INTERVAL HISTORY No family is at the beside, patient is sitting in chair for breakfast.  Still has mild left facial droop, but otherwise neuro intact.  Had right ICA stenting yesterday.  Vitals:   05/15/22 0900 05/15/22 1000 05/15/22 1100 05/15/22 1200  BP: 110/82 111/79 120/75   Pulse: 90 60 64 69  Resp: (!) '23 18 17 18  '$ Temp:    98.2 F (36.8 C)  TempSrc:    Oral  SpO2: 97% (!) 86% 97% 94%  Weight:      Height:       CBC:  Recent Labs  Lab 05/14/22 0744 05/15/22 0054  WBC 4.5 6.3  NEUTROABS 3.0 5.8  HGB 11.8* 10.5*  HCT 33.9* 30.6*  MCV 91.9 93.6  PLT 243 756   Basic Metabolic Panel:  Recent Labs  Lab 05/10/22 1022 05/11/22 0332 05/14/22 0744 05/15/22 0054  NA  --    < > 137 143  K  --    < > 3.6 4.4  CL  --    < > 107 111  CO2  --    < > 22 21*  GLUCOSE  --    < > 127* 163*  BUN  --    < > 17 19  CREATININE  --    < > 1.39* 1.41*  CALCIUM  --    < > 9.4 9.5  MG 2.2  --   --   --    < > = values in this interval not displayed.    IMAGING past 24 hours No results found.  PHYSICAL EXAM  Temp:  [97.6 F (36.4 C)-98.2 F (36.8 C)] 98.2 F (36.8 C) (11/30 1200) Pulse Rate:  [46-90] 69 (11/30 1200) Resp:  [10-30] 18 (11/30 1200) BP: (97-126)/(50-82) 120/75 (11/30 1100) SpO2:  [86 %-100 %] 94 % (11/30 1200) Arterial Line BP: (110-157)/(44-68) 157/68 (11/30 0800)  General - Well nourished, well developed, in no apparent distress  Cardiovascular - Regular rhythm and rate.  Mental Status -  Level of arousal and orientation to time, place, and person were intact. Dysarthria, no aphasia Language including expression, naming, repetition, comprehension was assessed and found intact. Attention span and concentration were normal.  Cranial Nerves II - XII - II - Visual field intact OU. III, IV, VI - Extraocular movements intact. V - Facial sensation intact bilaterally. VII - Left facial droop present VIII - Hearing & vestibular  intact bilaterally. X - Palate elevates symmetrically. XI - Chin turning & shoulder shrug intact bilaterally. XII - Tongue protrusion intact.  Motor Strength - left arm 4/5, left leg 4/5 right arm 5/5, right leg 5/5. Left grip strength 3/5.  Motor Tone - Muscle tone was assessed at the neck and appendages and was normal.  Sensory - Light touch assessed and is symmetrical.    Coordination - mild ataxia left   Gait and Station - deferred.  ASSESSMENT/PLAN Mr. KAILAN LAWS is a 81 y.o. male with history of  hypertension, diabetes, chronic kidney disease stage IIIa, was recently discharged from the hospital on 11/23 after being evaluated for acute/subacute stroke of right corona radiata.  At that time, he had presented with right facial droop and slurred speech.  He was noted to have carotid stenosis on carotid ultrasound.  Seen by neurology with recommendations for outpatient follow-up with interventional radiology to be considered for stenting.  He was started on aspirin and Plavix. After returning home, he reported  worsening of his dysphagia/dysarthria.  He noted that he had trouble maintaining his oral secretions causing him to drool out of the left side of his mouth. On 11/24, he began noticing weakness in his left hand.  He was having trouble holding his cane and was dropping things from his left hand.  Due to development of new symptoms, he came to the ER for evaluation.   Stroke: Acute right corona radiata ischemic infarct  Etiology:  due to right ICA stenosis/atherosclerosis CT head  1. Focal area of low attenuation within the right corona radiata corresponding to areas of restricted diffusion on recent MRI and compatible with evolutionary changes of previously noted acute to early subacute infarcts noted on 05/07/2022. 2. Chronic small vessel ischemic disease and brain atrophy. CTA head & neck  1. Negative for large vessel occlusion. But Positive for complex plaque in the proximal  Right ICA with a possible small focus of superimposed adherent thrombus (series 8, image 141 and series 4, image 113). Hemodynamically significant stenosis there estimated at 60%. And positive also for bulky bilateral calcified plaque of the Dominant Right Vertebral V4 segment with up to Severe stenosis. This is distal to the Right PICA, and the Basilar artery is patent without stenosis. 2. Additionally, mild to moderate Left ICA siphon petrous and cavernous segment atherosclerotic stenosis. MRI: New acute infarct in the right corona radiata, adjacent to the previously noted acute infarcts on 05/07/2022 2D Echo 11/22: EF 60-65%. Left ventricular diastolic parameters are consistent with Grade I diastolic dysfunction. Left atrial size was mildly dilated.  Status post right ICA stenting 11/29. LDL 24 HgbA1c 6.1 VTE prophylaxis - Lovenox aspirin 325 mg daily and clopidogrel 75 mg daily prior to admission, now on aspirin 325 mg daily and Brilinta (ticagrelor) 90 mg bid duration per IR  Therapy recommendations:  HH PT/OT  Disposition: Home today  Hypertension Home meds:  norvasc '5mg'$ , diovan 320-'25mg'$  Stable Avoid low BP Long-term BP goal normotensive  Hyperlipidemia Home meds:  zetia and crestor '5mg'$  LDL 24, goal < 70 Continue Crestor '5mg'$  Continue statin at discharge  Diabetes type II Controlled Home meds:  insulin, metformin HgbA1c 6.1, goal < 7.0 CBGs SSI  Other Stroke Risk Factors Advanced Age >/= 65  Hx stroke/TIA Coronary artery disease  Other Active Problems BPH CKD3, creatinine 1.35-1.38-1.47-1.42- 1.39-1.41  Hospital day # 4  Neurology will sign off. Please call with questions. Pt will follow up with stroke clinic NP at [facility] in about 4 weeks. Thanks for the consult.  Rosalin Hawking, MD PhD Stroke Neurology 05/15/2022 10:25 PM    To contact Stroke Continuity provider, please refer to [email or web address] After hours, contact General Neurology

## 2022-05-15 NOTE — Progress Notes (Signed)
Speech Language Pathology Treatment: Dysphagia  Patient Details Name: Patrick Moss MRN: [id number] DOB: 02/01/1941 Today's Date: 05/15/2022 Time: 2706-2376 SLP Time Calculation (min) (ACUTE ONLY): 15 min  Assessment / Plan / Recommendation Clinical Impression  Pt demonstrates recall on chin tuck strategy after question cue. Carried over with feedback to tuck chin a bit deeper. Pt verbalizes rationale for chin tuck and small sips and believes he will be able to continue this after d/c, though home health SLP is recommended to address strategy and dysarthria. Resume dys 3 diet/thin liquid diet  HPI HPI: Patrick Moss is a 81 y.o. male  was recently discharged from the hospital on 11/23 after being evaluated for acute/subacute stroke of right corona radiata.  At that time, he had presented with right facial droop and slurred speech.  He was noted to have carotid stenosis on carotid ultrasound.   He reports that during his hospital stay, he did have some mild sensation of dysphagia, although it appears he had passed his swallow screen and was not seen by speech therapy.  The patient was ambulating with his cane which she has chronically done due to right lower extremity arthritis and pain.  After returning home, he reported worsening of his dysphagia/dysarthria.  He noted that he had trouble maintaining his oral secretions causing him to drool out of the left side of his mouth.  Then he began noticing weakness in his left hand.  He was having trouble holding his cane and was dropping things from his left hand.  Due to development of new symptoms, he came to the ER for evaluation. New acute infarct in the right corona radiata, adjacent to the  previously noted acute infarcts on 05/07/2022.      SLP Plan  Continue with current plan of care      Recommendations for follow up therapy are one component of a multi-disciplinary discharge planning process, led by the attending physician.  Recommendations  may be updated based on patient status, additional functional criteria and insurance authorization.    Recommendations  Compensations: Slow rate;Small sips/bites;Chin tuck;Clear throat intermittently                Follow Up Recommendations: Acute inpatient rehab (3hours/day) Assistance recommended at discharge: Intermittent Supervision/Assistance SLP Visit Diagnosis: Dysarthria and anarthria (R47.1);Dysphagia, oropharyngeal phase (R13.12) Plan: Continue with current plan of care           Azhane Eckart, Katherene Ponto  05/15/2022, 2:01 PM

## 2022-05-15 NOTE — Progress Notes (Signed)
Physical Therapy Treatment Patient Details Name: Patrick Moss MRN: [id number] DOB: 1941/01/13 Today's Date: 05/15/2022   History of Present Illness 81 y.o. male who presents 05/10/22 with left hand weakness x 3 days. MRI acute infarct in the right corona radiata, adjacent to the previously noted acute infarcts on 05/07/2022. Underwent right vertebral artery, and right common carotid arteriograms 05/14/22. PMH significant for BPH, CKD 3A, hyperlipidemia, hypertension, osteoarthritis, diabetes type 2 on insulin, prostate malignancy    PT Comments    Pt demonstrated safe use of the cane and a min guard level of mobility in a home-like environment.  Emphasis on safety, gait stability and safe use of the cane with safe negotiation of stairs.  Pt should be safe with intermittent assist.    Recommendations for follow up therapy are one component of a multi-disciplinary discharge planning process, led by the attending physician.  Recommendations may be updated based on patient status, additional functional criteria and insurance authorization.  Follow Up Recommendations  Home health PT     Assistance Recommended at Discharge PRN  Patient can return home with the following Help with stairs or ramp for entrance;Assist for transportation   Equipment Recommendations  None recommended by PT    Recommendations for Other Services       Precautions / Restrictions Precautions Precautions: Fall Restrictions Weight Bearing Restrictions: No     Mobility  Bed Mobility Overal bed mobility: Needs Assistance Bed Mobility: Supine to Sit     Supine to sit: Min guard          Transfers Overall transfer level: Needs assistance Equipment used: Straight cane Transfers: Sit to/from Stand Sit to Stand: Min guard           General transfer comment: min guard for safety only, used UE's and cane appropriately to stand  up/    Ambulation/Gait Ambulation/Gait assistance: Min guard Gait  Distance (Feet): 400 Feet Assistive device: Straight cane Gait Pattern/deviations: Step-through pattern, Decreased step length - right, Decreased step length - left, Decreased stance time - right, Decreased stride length   Gait velocity interpretation: 1.31 - 2.62 ft/sec, indicative of limited community ambulator   General Gait Details: pt sequences well with the cane.  Still appears antalgic on the R, but is stable/steady.   Stairs Stairs: Yes Stairs assistance: Min guard Stair Management: One rail Right, Step to pattern, Forwards Number of Stairs: 3 General stair comments: safe with rail and cane.   Wheelchair Mobility    Modified Rankin (Stroke Patients Only) Modified Rankin (Stroke Patients Only) Modified Rankin: Moderate disability     Balance Overall balance assessment: Needs assistance Sitting-balance support: No upper extremity supported, Feet supported Sitting balance-Leahy Scale: Good       Standing balance-Leahy Scale: Fair                              Cognition Arousal/Alertness: Awake/alert Behavior During Therapy: WFL for tasks assessed/performed Overall Cognitive Status:  (NT formally)                                          Exercises      General Comments General comments (skin integrity, edema, etc.): mild dyspnea with activity.  VSS overall.      Pertinent Vitals/Pain Pain Assessment Pain Assessment: Faces Faces Pain Scale: Hurts a little bit Pain  Location: Rt lateral knee (chronic and why he uses cane) Pain Descriptors / Indicators: Discomfort Pain Intervention(s): Monitored during session    Home Living                          Prior Function            PT Goals (current goals can now be found in the care plan section) Acute Rehab PT Goals PT Goal Formulation: With patient Time For Goal Achievement: 05/25/22 Potential to Achieve Goals: Good Progress towards PT goals: Progressing toward  goals    Frequency    Min 3X/week      PT Plan Current plan remains appropriate    Co-evaluation              AM-PAC PT "6 Clicks" Mobility   Outcome Measure  Help needed turning from your back to your side while in a flat bed without using bedrails?: A Little Help needed moving from lying on your back to sitting on the side of a flat bed without using bedrails?: A Little Help needed moving to and from a bed to a chair (including a wheelchair)?: A Little Help needed standing up from a chair using your arms (e.g., wheelchair or bedside chair)?: A Little Help needed to walk in hospital room?: A Little Help needed climbing 3-5 steps with a railing? : A Little 6 Click Score: 18    End of Session   Activity Tolerance: Patient tolerated treatment well Patient left: in chair;with call bell/phone within reach;with chair alarm set Nurse Communication: Mobility status PT Visit Diagnosis: Other abnormalities of gait and mobility (R26.89)     Time: [id number] PT Time Calculation (min) (ACUTE ONLY): 11 min  Charges:  $Gait Training: 8-22 mins                     05/15/2022  Ginger Carne., PT Acute Rehabilitation Services [phone]  (office)   Patrick Moss 05/15/2022, 1:48 PM

## 2022-05-15 NOTE — Progress Notes (Signed)
Occupational Therapy Treatment Patient Details Name: Patrick Moss MRN: [id number] DOB: [birth year] Today's Date: 05/15/2022   History of present illness 81 y.o. male who presents 05/10/22 with left hand weakness x 3 days. MRI acute infarct in the right corona radiata, adjacent to the previously noted acute infarcts on 05/07/2022. Underwent right vertebral artery, and right common carotid arteriograms 05/14/22. PMH significant for BPH, CKD 3A, hyperlipidemia, hypertension, osteoarthritis, diabetes type 2 on insulin, prostate malignancy   OT comments  Now post-op, doing well working towards OT goals this session. BP remained stable as this was hid first time OOB since sx. Min guard for bed mobility, min guard for transfers with SPC. Min A for LB dressing due to sticky socks, min guard for grooming. Slightly DOE with activity and HR up to 120 with in room mobility and chair transfer. Reviewed L fine motor exercises with Pt. Pt with no further questions or concerns, POC remains appropriate at this time.    Recommendations for follow up therapy are one component of a multi-disciplinary discharge planning process, led by the attending physician.  Recommendations may be updated based on patient status, additional functional criteria and insurance authorization.    Follow Up Recommendations  Home health OT     Assistance Recommended at Discharge Intermittent Supervision/Assistance  Patient can return home with the following  A little help with walking and/or transfers;A little help with bathing/dressing/bathroom;Help with stairs or ramp for entrance;Assistance with cooking/housework;Direct supervision/assist for medications management;Direct supervision/assist for financial management;Assist for transportation;Assistance with feeding   Equipment Recommendations  None recommended by OT    Recommendations for Other Services      Precautions / Restrictions Precautions Precautions:  Fall Restrictions Weight Bearing Restrictions: No       Mobility Bed Mobility Overal bed mobility: Needs Assistance Bed Mobility: Supine to Sit     Supine to sit: Min guard          Transfers Overall transfer level: Needs assistance Equipment used: Straight cane Transfers: Sit to/from Stand Sit to Stand: Min guard           General transfer comment: min guard for safety only     Balance Overall balance assessment: Needs assistance Sitting-balance support: No upper extremity supported, Feet supported Sitting balance-Leahy Scale: Good Sitting balance - Comments: can tolerate strength testing with either leg without imbalance   Standing balance support: Single extremity supported Standing balance-Leahy Scale: Fair                             ADL either performed or assessed with clinical judgement   ADL Overall ADL's : Needs assistance/impaired     Grooming: Standing;Wash/dry face;Supervision/safety               Lower Body Dressing: Sit to/from stand;Minimal assistance Lower Body Dressing Details (indicate cue type and reason): ableto don underwear and pants including managing belt buckle Toilet Transfer: Min guard;Ambulation (cane)           Functional mobility during ADLs: Min guard;Cane General ADL Comments: LUE functionally improving for ADL tasks    Extremity/Trunk Assessment Upper Extremity Assessment Upper Extremity Assessment: LUE deficits/detail LUE Deficits / Details: decreased dexterity, finger opposition, strength 4+/5. Using functionally for cane use, improving in hand manipulation skills LUE Coordination: decreased fine motor;decreased gross motor (can perform with increased time and concentration)            Vision   Vision Assessment?: No  apparent visual deficits   Perception     Praxis      Cognition Arousal/Alertness: Awake/alert Behavior During Therapy: WFL for tasks assessed/performed Overall Cognitive  Status: Impaired/Different from baseline Area of Impairment: Memory, Safety/judgement, Awareness                   Current Attention Level: Sustained Memory: Decreased short-term memory Following Commands: Follows one step commands consistently, Follows one step commands with increased time, Follows multi-step commands inconsistently Safety/Judgement: Decreased awareness of deficits Awareness: Emergent Problem Solving: Slow processing, Difficulty sequencing, Requires verbal cues General Comments: Pt improved with cognition today - not back to baseline PTA, but good problem solving for functional tasks. able to count backwards from 100 by 5s with 50% accuracey        Exercises Other Exercises Other Exercises: reviewed fine motor control exercises    Shoulder Instructions       General Comments DIE with activity today - HR up to 120 with activity, BP once in the chair 129/71 (89) HR 83 at EOS    Pertinent Vitals/ Pain       Pain Assessment Pain Assessment: Faces Faces Pain Scale: Hurts a little bit Pain Location: Rt lateral knee (chronic and why he uses cane) Pain Descriptors / Indicators: Discomfort Pain Intervention(s): Monitored during session, Repositioned  Home Living                                          Prior Functioning/Environment              Frequency  Min 2X/week        Progress Toward Goals  OT Goals(current goals can now be found in the care plan section)  Progress towards OT goals: Progressing toward goals  Acute Rehab OT Goals Patient Stated Goal: get home today OT Goal Formulation: With patient Time For Goal Achievement: 05/25/22 Potential to Achieve Goals: Good  Plan Discharge plan remains appropriate    Co-evaluation                 AM-PAC OT "6 Clicks" Daily Activity     Outcome Measure   Help from another person eating meals?: A Little Help from another person taking care of personal grooming?: A  Little Help from another person toileting, which includes using toliet, bedpan, or urinal?: A Little Help from another person bathing (including washing, rinsing, drying)?: A Little Help from another person to put on and taking off regular upper body clothing?: A Little Help from another person to put on and taking off regular lower body clothing?: A Little 6 Click Score: 18    End of Session Equipment Utilized During Treatment: Gait belt (cane)  OT Visit Diagnosis: Unsteadiness on feet (R26.81);Muscle weakness (generalized) (M62.81);Other abnormalities of gait and mobility (R26.89);Other symptoms and signs involving cognitive function   Activity Tolerance Patient tolerated treatment well   Patient Left in chair;with call bell/phone within reach;with chair alarm set   Nurse Communication Mobility status        Time: [phone] OT Time Calculation (min): 33 min  Charges: OT General Charges $OT Visit: 1 Visit OT Treatments $Self Care/Home Management : 8-22 mins $Therapeutic Activity: 8-22 mins  Jesse Sans OTR/L Acute Rehabilitation Services Office: [place] 05/15/2022, 12:21 PM

## 2022-05-15 NOTE — Discharge Summary (Signed)
Physician Discharge Summary  Patrick Moss EGB:[id number] DOB: Feb 07, 1941 DOA: 05/10/2022  PCP: [name], MD  Admit date: 05/10/2022 Discharge date: 05/15/2022  Admitted From: Home Disposition: Home with home health PT   Recommendations for Outpatient Follow-up:  Follow up with PCP in 1-2 weeks Follow-up with neurology and interventional radiology in 2 weeks as scheduled  Home Health: PT Equipment/Devices: No new equipment  Discharge Condition: Stable CODE STATUS: Full Diet recommendation: Low-salt low-fat diabetic diet  Brief/Interim Summary: 81 y.o. male with medical history significant for hypertension, diabetes, chronic kidney disease stage IIIa, was recently discharged from the hospital on 11/23 after being evaluated for acute/subacute stroke of right corona radiata.  At that time, he had presented with facial droop and slurred speech.  He was noted to have carotid stenosis on carotid ultrasound.  Seen by neurology with recommendations for outpatient follow-up with interventional radiology to be considered for stenting.  He was started on aspirin and Plavix. After returning home, he reported worsening of his dysphagia/dysarthria.  He noted that he had trouble maintaining his oral secretions causing him to drool out of the left side of his mouth.  Subsequently, he began noticing weakness in his left hand.  He was having trouble holding his cane and was dropping things from his left hand.  Due to development of new symptoms, he came to the ER for evaluation.  Patient was transferred over to [facility] for further evaluation. A new stroke was identified on MRI.   Given patient's new stroke further workup was remarkable for right PCA stenosis at 60%, underwent right ICA stenting on 05/14/2022, tolerated procedure well is otherwise agreeable for discharge home.  Medication changes as below include transitioning from aspirin Plavix to aspirin Brilinta with plans to continue these until  further evaluation by neurology and interventional radiology.  Otherwise continue Crestor and Zetia, blood pressure currently well-controlled off his previous home medications, as such we will continue to hold these to avoid hypotension exacerbating previous stroke.  Recommend close follow-up with PCP neurology and interventional radiology in the next 1 to 2 weeks as scheduled.  Otherwise medications outlined as below, discharged with home health PT and stable for discharge.  Discharge Diagnoses:  Principal Problem:   Stroke (cerebrum) ([place]) Active Problems:   Mixed hyperlipidemia   Hypertension associated with diabetes ([place])   Type 2 diabetes with nephropathy ([place])   Uncontrolled type 2 diabetes mellitus with hyperglycemia ([place])   Acute ischemic stroke ([place])   GERD (gastroesophageal reflux disease)   Carotid stenosis, right   Dysphagia   Stage 3a chronic kidney disease (CKD) ([place])   Acute CVA (cerebrovascular accident) ([place])   Stenosis of internal carotid artery with cerebral infarction, right [facility])    Discharge Instructions  Discharge Instructions     Face-to-face encounter (required for Medicare/Medicaid patients)   Complete by: As directed    I Little Ishikawa certify that this patient is under my care and that I, or a nurse practitioner or physician's assistant working with me, had a face-to-face encounter that meets the physician face-to-face encounter requirements with this patient on 05/15/2022. The encounter with the patient was in whole, or in part for the following medical condition(s) which is the primary reason for home health care (List medical condition): Acute CVA with ambulatory dysfunction   The encounter with the patient was in whole, or in part, for the following medical condition, which is the primary reason for home health care: Ambulatory dysfunction secondary to acute CVA  I certify that, based on my findings, the following services are medically necessary  home health services: Physical therapy   Reason for Medically Necessary Home Health Services:  Therapy- Instruction on use of Assistive Device for Ambulation on all Surfaces Therapy- Personnel officer, Public librarian Therapy- Instruction on Safe use of Assistive Devices for ADLs     My clinical findings support the need for the above services: Unable to leave home safely without assistance and/or assistive device   Further, I certify that my clinical findings support that this patient is homebound due to: Unable to leave home safely without assistance   Home Health   Complete by: As directed    To provide the following care/treatments:  PT OT        Allergies as of 05/15/2022       Reactions   Septra [sulfamethoxazole-trimethoprim] Hives        Medication List     STOP taking these medications    amLODipine 5 MG tablet Commonly known as: NORVASC   aspirin 325 MG tablet Replaced by: aspirin 81 MG chewable tablet   celecoxib 400 MG capsule Commonly known as: CELEBREX   clopidogrel 75 MG tablet Commonly known as: PLAVIX   ezetimibe 10 MG tablet Commonly known as: Zetia   valsartan-hydrochlorothiazide 320-25 MG tablet Commonly known as: DIOVAN-HCT       TAKE these medications    aspirin 81 MG chewable tablet Chew 1 tablet (81 mg total) by mouth daily. Start taking on: May 16, 2022 Replaces: aspirin 325 MG tablet   B-D ULTRAFINE III SHORT PEN 31G X 8 MM Misc Generic drug: Insulin Pen Needle USE  1  PEN  NEEDLE AS DIRECTED   Basaglar KwikPen 100 UNIT/ML Inject 30 Units into the skin daily. What changed: when to take this   Brilinta 90 MG Tabs tablet Generic drug: ticagrelor Take 1 tablet (90 mg total) by mouth 2 (two) times daily.   calcium carbonate 1500 (600 Ca) MG Tabs tablet Commonly known as: OSCAL Take 1,500 mg by mouth 2 (two) times daily with a meal.   diclofenac Sodium 1 % Gel Commonly known as: Voltaren Apply 4 g  topically 4 (four) times daily as needed. For the knee   metFORMIN 500 MG 24 hr tablet Commonly known as: GLUCOPHAGE-XR TAKE 2 TABLETS EVERY DAY AFTER BREAKFAST What changed: See the new instructions.   omega-3 fish oil 1000 MG Caps capsule Commonly known as: MAXEPA Take 1 capsule by mouth daily.   pantoprazole 40 MG tablet Commonly known as: Protonix Take 1 tablet (40 mg total) by mouth daily.   rosuvastatin 5 MG tablet Commonly known as: CRESTOR Take 1 tablet (5 mg total) by mouth daily.   True Metrix Blood Glucose Test test strip Generic drug: glucose blood TEST BLOOD SUGAR TWO TO THREE TIMES DAILY Dx J69.67   Trulicity 1.5 EL/3.8BO Sopn Generic drug: Dulaglutide Inject 1.5 mg into the skin once a week. What changed: additional instructions   Vitamin D3 50 MCG (2000 UT) Tabs Take 1 capsule by mouth daily.        Follow-up Information     [facility] [place] Follow up.   Specialty: Home Health Services Why: Home health PT/OT and speech Agency will call you to arrange appts. Contact information: [address]  [place] [address] [phone]                Allergies  Allergen Reactions   Septra [  Sulfamethoxazole-Trimethoprim] Hives    Consultations: Neurology, neurointerventional radiology  Procedures/Studies: MR BRAIN WO CONTRAST  Result Date: 05/11/2022 CLINICAL DATA:  Stroke suspected EXAM: MRI HEAD WITHOUT CONTRAST TECHNIQUE: Multiplanar, multiecho pulse sequences of the brain and surrounding structures were obtained without intravenous contrast. COMPARISON:  05/07/2022 MRI head FINDINGS: Brain: Restricted diffusion with ADC correlate in the right corona radiata, adjacent to the areas of restricted diffusion seen on the prior MRI. The new focus measures up to 13 x 16 mm in the axial plane (AP x TR) (series 2, image 33). This is associated with increased T2 signal and favored to represent an acute infarct. No acute hemorrhage, mass,  mass effect, or midline shift. No hydrocephalus or extra-axial collection. Scattered T2 hyperintense signal in the periventricular white matter, likely the sequela of mild chronic small vessel ischemic disease. Vascular: Normal arterial flow voids. Skull and upper cervical spine: Normal marrow signal. Sinuses/Orbits: Clear paranasal sinuses. Status post bilateral lens replacements. Other: The mastoids are well aerated. IMPRESSION: New acute infarct in the right corona radiata, adjacent to the previously noted acute infarcts on 05/07/2022. These results will be called to the ordering clinician or representative by the Radiologist Assistant, and communication documented in the PACS or [employer]. Electronically Signed   By: Merilyn Baba M.D.   On: 05/11/2022 02:47   CT ANGIO HEAD NECK W WO CM  Result Date: 05/10/2022 CLINICAL DATA:  81 year old male with weakness on the left side, difficulty swallowing. Recent small right corona radiata infarcts on MRI 05/07/2022. prostate cancer. EXAM: CT ANGIOGRAPHY HEAD AND NECK TECHNIQUE: Multidetector CT imaging of the head and neck was performed using the standard protocol during bolus administration of intravenous contrast. Multiplanar CT image reconstructions and MIPs were obtained to evaluate the vascular anatomy. Carotid stenosis measurements (when applicable) are obtained utilizing NASCET criteria, using the distal internal carotid diameter as the denominator. RADIATION DOSE REDUCTION: This exam was performed according to the departmental dose-optimization program which includes automated exposure control, adjustment of the mA and/or kV according to patient size and/or use of iterative reconstruction technique. CONTRAST:  38m OMNIPAQUE IOHEXOL 350 MG/ML SOLN COMPARISON:  CT head this morning reported separately. Brain MRI and intracranial MRA 05/07/2022. FINDINGS: CTA NECK Skeleton: Advanced cervical spine degeneration. Advanced cervicothoracic junction facet  degeneration. But no acute or suspicious osseous lesion identified. Upper chest: Visible central pulmonary arteries appear patent. Visible lungs are largely clear, minor atelectasis. No superior mediastinal lymphadenopathy. Other neck: Negative. Aortic arch: 3 vessel arch configuration.  No arch atherosclerosis. Right carotid system: Minimal right CCA plaque. Right ICA origin and bulb combined soft and calcified plaque with evidence of hemodynamically significant proximal right ICA stenosis on series 4, image 113, numerically estimated at 60 % with respect to the distal vessel. A small amount of adherent thrombus is possible as seen on that image. No additional plaque or stenosis to the skull base. Left carotid system: Minimal plaque and no stenosis. Vertebral arteries: Proximal right subclavian artery is normal. Calcified plaque at the right vertebral artery origin, but only mild origin stenosis. Mild additional V1 segment plaque and stenosis. Right vertebral artery appears somewhat dominant and is otherwise negative to the skull base. Proximal left subclavian artery is normal. Left vertebral artery origin is normal. There is mild left V1 plaque and tortuosity, mild associated stenosis. Mildly non dominant left vertebral is otherwise negative to the skull base. CTA HEAD Posterior circulation: Dominant right V4 segment. Bulky bilateral V4 segment calcified plaque on series 5, image  286. Mild stenosis on the left. But moderate to severe stenosis in the dominant right V4 segment on series 5, image 279. Both PICA origins occurred early and are patent, proximal to this plaque. Vertebrobasilar junction remains patent without stenosis. Patent basilar artery, basilar tip, SCA and PCA origins. Posterior communicating arteries are diminutive or absent. Bilateral PCA branches are within normal limits. Anterior circulation: Both ICA siphons are patent. Moderate left ICA petrous soft plaque (series 10, image 123) with mild  associated stenosis. Mild to moderate additional left ICA cavernous segment plaque and mild to moderate distal cavernous stenosis on series 5, image 21. Patent supraclinoid left ICA without stenosis. Normal left ophthalmic artery origin. Right ICA siphon mild calcified plaque without stenosis. Patent carotid termini. Patent MCA and ACA origins. Normal anterior communicating artery. Normal bilateral ACA branches except for tortuosity. Left MCA M1 segment and bifurcation are patent without stenosis. Left MCA branches are within normal limits. Right MCA M1 segment and trifurcation are patent without stenosis. Right MCA branches are within normal limits. Venous sinuses: Patent. Anatomic variants: Dominant right vertebral artery. Review of the MIP images confirms the above findings IMPRESSION: 1. Negative for large vessel occlusion. But Positive for complex plaque in the proximal Right ICA with a possible small focus of superimposed adherent thrombus (series 8, image 141 and series 4, image 113). Hemodynamically significant stenosis there estimated at 60%. And positive also for bulky bilateral calcified plaque of the Dominant Right Vertebral V4 segment with up to Severe stenosis. This is distal to the Right PICA, and the Basilar artery is patent without stenosis. 2. Additionally, mild to moderate Left ICA siphon petrous and cavernous segment atherosclerotic stenosis. Salient findings discussed by telephone with Dr. Godfrey Pick on 05/10/2022 at 08:25 . Electronically Signed   By: Genevie Ann M.D.   On: 05/10/2022 08:28   CT Head Wo Contrast  Result Date: 05/10/2022 CLINICAL DATA:  Neuro deficit. Difficulty swallowing and weakness on left side. EXAM: CT HEAD WITHOUT CONTRAST TECHNIQUE: Contiguous axial images were obtained from the base of the skull through the vertex without intravenous contrast. RADIATION DOSE REDUCTION: This exam was performed according to the departmental dose-optimization program which includes  automated exposure control, adjustment of the mA and/or kV according to patient size and/or use of iterative reconstruction technique. COMPARISON:  MR 05/07/2022 FINDINGS: Brain: There is a focal area of low attenuation within the right corona radiata, image 18/3 corresponding to areas of restricted diffusion on recent MRI and compatible with evolutionary changes of previously noted acute to early subacute infarcts noted on 05/07/2022. No signs of acute intracranial hemorrhage, mass or mass effect. No significant hydrocephalus. There is mild diffuse low-attenuation within the subcortical and periventricular white matter compatible with chronic microvascular disease. Prominence of sulci and ventricles compatible with brain atrophy. Vascular: No hyperdense vessel or unexpected calcification. Skull: Normal. Negative for fracture or focal lesion. Sinuses/Orbits: No acute finding. Other: None IMPRESSION: 1. Focal area of low attenuation within the right corona radiata corresponding to areas of restricted diffusion on recent MRI and compatible with evolutionary changes of previously noted acute to early subacute infarcts noted on 05/07/2022. 2. Chronic small vessel ischemic disease and brain atrophy. Electronically Signed   By: Kerby Moors M.D.   On: 05/10/2022 08:06   ECHOCARDIOGRAM COMPLETE  Result Date: 05/07/2022    ECHOCARDIOGRAM REPORT   Patient Name:   Patrick Moss Date of Exam: 05/07/2022 Medical Rec #:  [id number]       Height:  66.0 in Accession #:    [id number]      Weight:       177.0 lb Date of Birth:  [birth year]       BSA:          1.899 m Patient Age:    81 years        BP:           107/68 mmHg Patient Gender: M               HR:           83 bpm. Exam Location:  [facility] Procedure: 2D Echo, Cardiac Doppler and Color Doppler Indications:    Stroke  History:        Patient has no prior history of Echocardiogram examinations.                 Stroke; Risk Factors:Hypertension, Diabetes and  Dyslipidemia.  Sonographer:    Wenda Low Referring Phys: [place] ZAMMIT IMPRESSIONS  1. Left ventricular ejection fraction, by estimation, is 60 to 65%. The left ventricle has normal function. The left ventricle has no regional wall motion abnormalities. Left ventricular diastolic parameters are consistent with Grade I diastolic dysfunction (impaired relaxation).  2. RV-RA gradient 24 mmHg suggesting normal estimated RVSP presuming normal CVP. Right ventricular systolic function is normal. The right ventricular size is normal.  3. Left atrial size was mildly dilated.  4. The mitral valve is grossly normal. Trivial mitral valve regurgitation.  5. The aortic valve is tricuspid. Aortic valve regurgitation is not visualized. Aortic valve mean gradient measures 2.0 mmHg.  6. Unable to estimate CVP.  7. Hypermobile interatrial septum without obvious shunting. Consider saline contrast bubble study for further investigation. Comparison(s): No prior Echocardiogram. FINDINGS  Left Ventricle: Left ventricular ejection fraction, by estimation, is 60 to 65%. The left ventricle has normal function. The left ventricle has no regional wall motion abnormalities. The left ventricular internal cavity size was normal in size. There is  borderline left ventricular hypertrophy. Left ventricular diastolic parameters are consistent with Grade I diastolic dysfunction (impaired relaxation). Right Ventricle: RV-RA gradient 24 mmHg suggesting normal estimated RVSP presuming normal CVP. The right ventricular size is normal. No increase in right ventricular wall thickness. Right ventricular systolic function is normal. Left Atrium: Left atrial size was mildly dilated. Right Atrium: Right atrial size was normal in size. Pericardium: There is no evidence of pericardial effusion. Presence of epicardial fat layer. Mitral Valve: The mitral valve is grossly normal. Trivial mitral valve regurgitation. MV peak gradient, 5.5 mmHg. The mean  mitral valve gradient is 2.0 mmHg. Tricuspid Valve: The tricuspid valve is grossly normal. Tricuspid valve regurgitation is trivial. Aortic Valve: The aortic valve is tricuspid. There is mild aortic valve annular calcification. Aortic valve regurgitation is not visualized. Aortic valve mean gradient measures 2.0 mmHg. Aortic valve peak gradient measures 4.9 mmHg. Aortic valve area, by  VTI measures 3.11 cm. Pulmonic Valve: The pulmonic valve was grossly normal. Pulmonic valve regurgitation is trivial. Aorta: The aortic root is normal in size and structure. Venous: Unable to estimate CVP. The inferior vena cava was not well visualized. IAS/Shunts: There is redundancy of the interatrial septum. No atrial level shunt detected by color flow Doppler.  LEFT VENTRICLE PLAX 2D LVIDd:         4.90 cm   Diastology LVIDs:         3.30 cm   LV e' medial:    5.98 cm/s  LV PW:         1.00 cm   LV E/e' medial:  9.5 LV IVS:        1.00 cm   LV e' lateral:   9.46 cm/s LVOT diam:     2.00 cm   LV E/e' lateral: 6.0 LV SV:         70 LV SV Index:   37 LVOT Area:     3.14 cm  RIGHT VENTRICLE RV Basal diam:  3.05 cm RV Mid diam:    2.70 cm RV S prime:     14.50 cm/s LEFT ATRIUM             Index        RIGHT ATRIUM           Index LA diam:        4.00 cm 2.11 cm/m   RA Area:     13.90 cm LA Vol (A2C):   64.8 ml 34.13 ml/m  RA Volume:   32.40 ml  17.06 ml/m LA Vol (A4C):   70.3 ml 37.03 ml/m LA Biplane Vol: 69.2 ml 36.45 ml/m  AORTIC VALVE                    PULMONIC VALVE AV Area (Vmax):    3.00 cm     PV Vmax:       1.02 m/s AV Area (Vmean):   3.12 cm     PV Peak grad:  4.2 mmHg AV Area (VTI):     3.11 cm AV Vmax:           111.00 cm/s AV Vmean:          69.900 cm/s AV VTI:            0.226 m AV Peak Grad:      4.9 mmHg AV Mean Grad:      2.0 mmHg LVOT Vmax:         106.00 cm/s LVOT Vmean:        69.500 cm/s LVOT VTI:          0.224 m LVOT/AV VTI ratio: 0.99  AORTA Ao Root diam: 3.60 cm MITRAL VALVE               TRICUSPID  VALVE MV Area (PHT): 3.63 cm    TR Peak grad:   24.6 mmHg MV Area VTI:   2.52 cm    TR Vmax:        248.00 cm/s MV Peak grad:  5.5 mmHg MV Mean grad:  2.0 mmHg    SHUNTS MV Vmax:       1.17 m/s    Systemic VTI:  0.22 m MV Vmean:      56.3 cm/s   Systemic Diam: 2.00 cm MV Decel Time: 209 msec MV E velocity: 56.60 cm/s MV A velocity: 88.30 cm/s MV E/A ratio:  0.64 Rozann Lesches MD Electronically signed by Rozann Lesches MD Signature Date/Time: 05/07/2022/4:52:57 PM    Final    US Carotid Bilateral  Result Date: 05/07/2022 CLINICAL DATA:  81 year old male with history of stroke EXAM: BILATERAL CAROTID DUPLEX ULTRASOUND TECHNIQUE: Pearline Cables scale imaging, color Doppler and duplex ultrasound were performed of bilateral carotid and vertebral arteries in the neck. COMPARISON:  None Available. FINDINGS: Criteria: Quantification of carotid stenosis is based on velocity parameters that correlate the residual internal carotid diameter with NASCET-based stenosis levels, using the diameter of the distal internal carotid lumen as the  denominator for stenosis measurement. The following velocity measurements were obtained: RIGHT ICA:  Systolic 401 cm/sec, Diastolic 28 cm/sec CCA:  97 cm/sec SYSTOLIC ICA/CCA RATIO:  1.5 ECA:  143 cm/sec LEFT ICA:  Systolic 80 cm/sec, Diastolic 18 cm/sec CCA:  89 cm/sec SYSTOLIC ICA/CCA RATIO:  0.9 ECA:  127 cm/sec Right Brachial SBP: Not acquired Left Brachial SBP: Not acquired RIGHT CAROTID ARTERY: No significant calcifications of the right common carotid artery. Intermediate waveform maintained. Heterogeneous, irregular, and partially calcified plaque at the right carotid bifurcation. No significant lumen shadowing. Low resistance waveform of the right ICA. No significant tortuosity. RIGHT VERTEBRAL ARTERY: Antegrade flow with low resistance waveform. LEFT CAROTID ARTERY: No significant calcifications of the left common carotid artery. Intermediate waveform maintained. Heterogeneous and  partially calcified plaque at the left carotid bifurcation without significant lumen shadowing. Low resistance waveform of the left ICA. No significant tortuosity. LEFT VERTEBRAL ARTERY:  Antegrade flow with low resistance waveform. IMPRESSION: Right: Heterogeneous and partially calcified plaque at the right carotid bifurcation contributes to 50%-69% stenosis by established duplex criteria. Left: Color duplex indicates minimal heterogeneous and calcified plaque, with no hemodynamically significant stenosis by duplex criteria in the extracranial cerebrovascular circulation. Signed, Dulcy Fanny. Nadene Rubins, RPVI [facility] Radiology Electronically Signed   By: Corrie Mckusick D.O.   On: 05/07/2022 15:54   MR BRAIN WO CONTRAST  Result Date: 05/07/2022 CLINICAL DATA:  Altered mental status EXAM: MRI HEAD WITHOUT CONTRAST MRA HEAD WITHOUT CONTRAST TECHNIQUE: Multiplanar, multi-echo pulse sequences of the brain and surrounding structures were acquired without intravenous contrast. Angiographic images of the Circle of Willis were acquired using MRA technique without intravenous contrast. COMPARISON:  None Available. FINDINGS: MRI HEAD FINDINGS Brain: There are 2 small foci of diffusion restriction with associated FLAIR signal abnormality in the right corona radiata consistent with acute to early subacute infarcts. There is no associated hemorrhage or mass effect. There is no other evidence of acute infarct. There is no acute intracranial hemorrhage or extra-axial fluid collection There is mild background parenchymal volume loss with prominence of the ventricular system and extra-axial CSF spaces. There are scattered small foci of FLAIR signal abnormality in the remainder of the supratentorial white matter which are nonspecific but likely reflects sequela of mild chronic small-vessel ischemic change There is no mass lesion.  There is no mass effect or midline shift.  Vascular: See below. Skull and upper cervical spine: Normal marrow signal. Sinuses/Orbits: The paranasal sinuses are clear. Bilateral lens implants are in place. The globes and orbits are otherwise unremarkable. Other: None. MRA HEAD FINDINGS Anterior circulation: The intracranial ICAs are patent, without significant atherosclerotic irregularity or significant stenosis. The bilateral MCAs are patent, without proximal stenosis or occlusion. The bilateral ACAs are patent, without proximal stenosis or occlusion. The anterior communicating artery is normal. There is no aneurysm or AVM. Posterior circulation: The bilateral V4 segments are patent. The basilar artery is patent. The major cerebellar arteries appear patent. The bilateral PCAs are patent, without proximal stenosis or occlusion. There is no aneurysm or AVM. Anatomic variants: None. IMPRESSION: 1. Two small acute to early subacute infarcts in the right corona radiata. 2. Background parenchymal volume loss and mild chronic small-vessel ischemic change. 3.  Normal intracranial vasculature. Electronically Signed   By: Valetta Mole M.D.   On: 05/07/2022 14:03   MR ANGIO HEAD WO CONTRAST  Result Date: 05/07/2022 CLINICAL DATA:  Altered mental status EXAM: MRI HEAD WITHOUT CONTRAST MRA HEAD WITHOUT CONTRAST  TECHNIQUE: Multiplanar, multi-echo pulse sequences of the brain and surrounding structures were acquired without intravenous contrast. Angiographic images of the Circle of Willis were acquired using MRA technique without intravenous contrast. COMPARISON:  None Available. FINDINGS: MRI HEAD FINDINGS Brain: There are 2 small foci of diffusion restriction with associated FLAIR signal abnormality in the right corona radiata consistent with acute to early subacute infarcts. There is no associated hemorrhage or mass effect. There is no other evidence of acute infarct. There is no acute intracranial hemorrhage or extra-axial fluid collection There is mild background  parenchymal volume loss with prominence of the ventricular system and extra-axial CSF spaces. There are scattered small foci of FLAIR signal abnormality in the remainder of the supratentorial white matter which are nonspecific but likely reflects sequela of mild chronic small-vessel ischemic change There is no mass lesion.  There is no mass effect or midline shift. Vascular: See below. Skull and upper cervical spine: Normal marrow signal. Sinuses/Orbits: The paranasal sinuses are clear. Bilateral lens implants are in place. The globes and orbits are otherwise unremarkable. Other: None. MRA HEAD FINDINGS Anterior circulation: The intracranial ICAs are patent, without significant atherosclerotic irregularity or significant stenosis. The bilateral MCAs are patent, without proximal stenosis or occlusion. The bilateral ACAs are patent, without proximal stenosis or occlusion. The anterior communicating artery is normal. There is no aneurysm or AVM. Posterior circulation: The bilateral V4 segments are patent. The basilar artery is patent. The major cerebellar arteries appear patent. The bilateral PCAs are patent, without proximal stenosis or occlusion. There is no aneurysm or AVM. Anatomic variants: None. IMPRESSION: 1. Two small acute to early subacute infarcts in the right corona radiata. 2. Background parenchymal volume loss and mild chronic small-vessel ischemic change. 3.  Normal intracranial vasculature. Electronically Signed   By: Valetta Mole M.D.   On: 05/07/2022 14:03     Subjective: No acute issues or events overnight, tolerated procedure well denies nausea vomiting diarrhea constipation headache fevers chills or chest pain   Discharge Exam: Vitals:   05/15/22 1100 05/15/22 1200  BP: 120/75   Pulse: 64 69  Resp: 17 18  Temp:  98.2 F (36.8 C)  SpO2: 97% 94%   Vitals:   05/15/22 0900 05/15/22 1000 05/15/22 1100 05/15/22 1200  BP: 110/82 111/79 120/75   Pulse: 90 60 64 69  Resp: (!) '23 18 17 18   '$ Temp:    98.2 F (36.8 C)  TempSrc:    Oral  SpO2: 97% (!) 86% 97% 94%  Weight:      Height:        General: Pt is alert, awake, not in acute distress Cardiovascular: RRR, S1/S2 +, no rubs, no gallops Respiratory: CTA bilaterally, no wheezing, no rhonchi Abdominal: Soft, NT, ND, bowel sounds + Extremities: no edema, no cyanosis    The results of significant diagnostics from this hospitalization (including imaging, microbiology, ancillary and laboratory) are listed below for reference.     Microbiology: Recent Results (from the past 240 hour(s))  Urine Culture     Status: None   Collection Time: 05/07/22  7:00 PM   Specimen: Urine, Clean Catch  Result Value Ref Range Status   Specimen Description   Final    URINE, CLEAN CATCH Performed at [facility], [address] [place], [place] [address]    Special Requests   Final    NONE Performed at [facility], [address], [place], [place] [address]    Culture   Final  NO GROWTH Performed at [facility], [place] [address] [place], [place] [address]    Report Status 05/09/2022 FINAL  Final  MRSA Next Gen by PCR, Nasal     Status: None   Collection Time: 05/14/22  4:45 PM   Specimen: Nasal Mucosa; Nasal Swab  Result Value Ref Range Status   MRSA by PCR Next Gen NOT DETECTED NOT DETECTED Final    Comment: (NOTE) The GeneXpert MRSA Assay (FDA approved for NASAL specimens only), is one component of a comprehensive MRSA colonization surveillance program. It is not intended to diagnose MRSA infection nor to guide or monitor treatment for MRSA infections. Test performance is not FDA approved in patients less than 85 years old. Performed at [facility], [place] [address], [place], [place] [address]      Labs: BNP (last 3 results) No results for input(s): "BNP" in the last 8760 hours. Basic Metabolic Panel: Recent Labs  Lab 05/10/22 0505 05/10/22 1022 05/11/22 0332 05/12/22 0406 05/14/22 0744  05/15/22 0054  NA 140  --  143 141 137 143  K 3.6  --  3.6 4.7 3.6 4.4  CL 109  --  111 109 107 111  CO2 21*  --  22 21* 22 21*  GLUCOSE 119*  --  78 98 127* 163*  BUN 28*  --  '17 23 17 19  '$ CREATININE 1.47*  --  1.32* 1.36* 1.39* 1.41*  CALCIUM 9.5  --  9.4 9.6 9.4 9.5  MG  --  2.2  --   --   --   --    Liver Function Tests: Recent Labs  Lab 05/10/22 0505  AST 24  ALT 30  ALKPHOS 60  BILITOT 0.7  PROT 6.9  ALBUMIN 3.9   No results for input(s): "LIPASE", "AMYLASE" in the last 168 hours. No results for input(s): "AMMONIA" in the last 168 hours. CBC: Recent Labs  Lab 05/10/22 0505 05/11/22 0332 05/12/22 0406 05/14/22 0744 05/15/22 0054  WBC 6.1 5.1 5.4 4.5 6.3  NEUTROABS 4.7  --   --  3.0 5.8  HGB 11.8* 11.1* 11.8* 11.8* 10.5*  HCT 35.2* 33.3* 34.2* 33.9* 30.6*  MCV 93.4 94.3 92.4 91.9 93.6  PLT 239 220 188 243 228   Cardiac Enzymes: No results for input(s): "CKTOTAL", "CKMB", "CKMBINDEX", "TROPONINI" in the last 168 hours. BNP: Invalid input(s): "POCBNP" CBG: Recent Labs  Lab 05/14/22 1150 05/14/22 1736 05/14/22 2126 05/15/22 0809 05/15/22 1152  GLUCAP 106* 180* 183* 128* 186*   D-Dimer No results for input(s): "DDIMER" in the last 72 hours. Hgb A1c No results for input(s): "HGBA1C" in the last 72 hours. Lipid Profile No results for input(s): "CHOL", "HDL", "LDLCALC", "TRIG", "CHOLHDL", "LDLDIRECT" in the last 72 hours. Thyroid function studies No results for input(s): "TSH", "T4TOTAL", "T3FREE", "THYROIDAB" in the last 72 hours.  Invalid input(s): "FREET3" Anemia work up No results for input(s): "VITAMINB12", "FOLATE", "FERRITIN", "TIBC", "IRON", "RETICCTPCT" in the last 72 hours. Urinalysis    Component Value Date/Time   COLORURINE AMBER (A) 05/07/2022 1304   APPEARANCEUR CLEAR 05/07/2022 1304   APPEARANCEUR Clear 12/31/2021 1416   LABSPEC 1.018 05/07/2022 1304   PHURINE 6.0 05/07/2022 1304   GLUCOSEU NEGATIVE 05/07/2022 1304   HGBUR  NEGATIVE 05/07/2022 1304   BILIRUBINUR NEGATIVE 05/07/2022 1304   BILIRUBINUR Negative 12/31/2021 1416   KETONESUR NEGATIVE 05/07/2022 1304   PROTEINUR 30 (A) 05/07/2022 1304   NITRITE POSITIVE (A) 05/07/2022 1304   LEUKOCYTESUR NEGATIVE 05/07/2022  1304   Sepsis Labs Recent Labs  Lab 05/11/22 0332 05/12/22 0406 05/14/22 0744 05/15/22 0054  WBC 5.1 5.4 4.5 6.3   Microbiology Recent Results (from the past 240 hour(s))  Urine Culture     Status: None   Collection Time: 05/07/22  7:00 PM   Specimen: Urine, Clean Catch  Result Value Ref Range Status   Specimen Description   Final    URINE, CLEAN CATCH Performed at [facility], [address], [place], [place] [address]    Special Requests   Final    NONE Performed at [facility], [address] [place], [place] [address]    Culture   Final    NO GROWTH Performed at [facility], [place] [address] [place], [place] [address]    Report Status 05/09/2022 FINAL  Final  MRSA Next Gen by PCR, Nasal     Status: None   Collection Time: 05/14/22  4:45 PM   Specimen: Nasal Mucosa; Nasal Swab  Result Value Ref Range Status   MRSA by PCR Next Gen NOT DETECTED NOT DETECTED Final    Comment: (NOTE) The GeneXpert MRSA Assay (FDA approved for NASAL specimens only), is one component of a comprehensive MRSA colonization surveillance program. It is not intended to diagnose MRSA infection nor to guide or monitor treatment for MRSA infections. Test performance is not FDA approved in patients less than 13 years old. Performed at [facility], [place] [address], [place], [place] [address]      Time coordinating discharge: Over 30 minutes  SIGNED:   Little Ishikawa, DO [facility] 05/15/2022, 2:56 PM Pager   If 7PM-7AM, please contact night-coverage [email or web address]

## 2022-05-15 NOTE — Progress Notes (Signed)
ANTICOAGULATION CONSULT NOTE Post Interventional Neuroradiology Procedure Pharmacy Consult for heparin Indication:  Post Interventional Neuroradiology Procedure  Heparin Dosing Weight: 78.9 kg  Labs: Recent Labs    05/12/22 0406 05/13/22 0522 05/14/22 0744 05/15/22 0054  HGB 11.8*  --  11.8* 10.5*  HCT 34.2*  --  33.9* 30.6*  PLT 188  --  243 228  LABPROT  --  14.2  --   --   INR  --  1.1  --   --   HEPARINUNFRC  --   --   --  0.33  CREATININE 1.36*  --  1.39* 1.41*    Assessment: 12 yom presenting with complex plaque in proximal R ICA, now s/p cerebral angiogram with R ICA stent placement 11/29. Pharmacy consulted to dose heparin per post-interventional neuroradiology procedure protocol. Patient already started on heparin at 500 units/hr in PACU. Patient is not on anticoagulation PTA. CBC stable. No bleed issues reported.  Heparin level resulted slightly above the conservative goal 0.33; will decrease ~ 1 unit/kg/hr; no overt s/sx of bleeding per RN  Goal of Therapy:  Heparin level 0.1-0.25 units/ml Monitor platelets by anticoagulation protocol: Yes   Plan:  Reduce heparin to 550 units/hr (7 units/kg/hr) per protocol Check 8hr heparin level Monitor CBC, s/sx bleeding Heparin off at 0800 on 11/30 per protocol  Georga Bora, PharmD Clinical Pharmacist 05/15/2022 2:02 AM Please check AMION for all [facility] Pharmacy numbers

## 2022-05-16 ENCOUNTER — Telehealth: Payer: Self-pay | Admitting: *Deleted

## 2022-05-16 NOTE — Patient Outreach (Signed)
  Care Coordination [facility] Note Transition Care Management Follow-up Telephone Call Date of discharge and from where: 05/15/22 How have you been since you were released from the hospital? Improving. Confused about appointments. Isn't interested in going to so many Any questions or concerns? Yes. Questioned need for so many follow-up appointments. Reasons provided.  Items Reviewed: Did the pt receive and understand the discharge instructions provided? Yes  Medications obtained and verified? Yes . Reviewed with brother. Multiple med changes. Difficult to ascertain complete understanding. Strongly advised to take all medications, new and old, to PCP appt on Monday. Appt note added for PCP/nurse to closely review and reconcile.  Other? No  Any new allergies since your discharge? No  Dietary orders reviewed? Yes Do you have support at home? Yes   Home Care and Equipment/Supplies: Were home health services ordered? yes If so, what is the name of the agency? [facility]  Has the agency set up a time to come to the patient's home? yes Were any new equipment or medical supplies ordered?  No What is the name of the medical supply agency?  Were you able to get the supplies/equipment? not applicable Do you have any questions related to the use of the equipment or supplies? No  Functional Questionnaire: (I = Independent and D = Dependent) ADLs: I  Bathing/Dressing- I  Meal Prep- I  Eating- I  Maintaining continence- I  Transferring/Ambulation- I  Managing Meds- D  Follow up appointments reviewed:  PCP Hospital f/u appt confirmed? Yes  Scheduled to see [facility] DOD on 05/19/22 at 8:20. Strongly advised that he needs to keep this f/u appt in order to review meds and referrals. [facility] f/u appt confirmed? No  Interventional radiologist to call son back for scheduling. Neurology referral needed in 1 month. Are transportation arrangements needed? No  If their condition worsens, is the  pt aware to call PCP or go to the Emergency Dept.? Yes Was the patient provided with contact information for the PCP's office or ED? Yes Was to pt encouraged to call back with questions or concerns? Yes  SDOH assessments and interventions completed:   Yes SDOH Interventions Today    Flowsheet Row Most Recent Value  SDOH Interventions   Housing Interventions Intervention Not Indicated  Transportation Interventions Intervention Not Indicated       Care Coordination Interventions:  Referred for Care Coordination Services:  RN Care Coordinator Added appt notes that patient needs thorough med reconciliation at PCP f/u and neurology referral    Encounter Outcome:  Pt. Visit Completed    Chong Sicilian, BSN, RN-BC RN Care Coordinator [place] Direct Dial: [phone] Main #: [phone]

## 2022-05-16 NOTE — TOC Transition Note (Signed)
Transition of Care [facility]) - CM/SW Discharge Note   Patient Details  Name: Patrick Moss MRN: [id number] Date of Birth: 10-02-1940  Transition of Care [facility]) CM/SW Contact:  Ella Bodo, RN Phone Number: 05/16/2022, 10:32 AM   Clinical Narrative:    Patient medically stable for discharge home today.  [facility] made aware of discharge home and addition of HHST.  Patient denies any other needs for home.    Final next level of care: Home w Home Health Services Barriers to Discharge: Barriers Resolved   Patient Goals and CMS Choice   CMS Medicare.gov Compare Post Acute Care list provided to:: Patient Choice offered to / list presented to : Patient  Discharge Placement                       Discharge Plan and Services   Discharge Planning Services: CM Consult Post Acute Care Choice: Home Health                    HH Arranged: PT, OT, Speech Therapy [place] Agency: [place] Date [place]: 05/15/22   Representative spoke with at [place]: Otila Kluver  Social Determinants of Health (SDOH) Interventions     Readmission Risk Interventions     No data to display          Reinaldo Raddle, RN, BSN  Trauma/Neuro ICU Case Manager [phone]

## 2022-05-18 NOTE — Anesthesia Postprocedure Evaluation (Signed)
Anesthesia Post Note  Patient: Patrick Moss  Procedure(s) Performed: R ICA stent     Patient location during evaluation: PACU Anesthesia Type: General Level of consciousness: awake and patient cooperative Pain management: pain level controlled Vital Signs Assessment: post-procedure vital signs reviewed and stable Respiratory status: spontaneous breathing, nonlabored ventilation, respiratory function stable and patient connected to nasal cannula oxygen Cardiovascular status: blood pressure returned to baseline and stable Postop Assessment: no apparent nausea or vomiting Anesthetic complications: no  No notable events documented.  Last Vitals:  Vitals:   05/15/22 1100 05/15/22 1200  BP: 120/75   Pulse: 64 69  Resp: 17 18  Temp:  36.8 C  SpO2: 97% 94%    Last Pain:  Vitals:   05/15/22 1200  TempSrc: Oral  PainSc:    Pain Goal:                   [name]

## 2022-05-19 ENCOUNTER — Ambulatory Visit (INDEPENDENT_AMBULATORY_CARE_PROVIDER_SITE_OTHER): Payer: Medicare HMO | Admitting: Family Medicine

## 2022-05-19 ENCOUNTER — Encounter: Payer: Self-pay | Admitting: Family Medicine

## 2022-05-19 VITALS — BP 137/66 | HR 80 | Temp 96.6°F | Ht 66.0 in | Wt 176.0 lb

## 2022-05-19 DIAGNOSIS — R6889 Other general symptoms and signs: Secondary | ICD-10-CM | POA: Diagnosis not present

## 2022-05-19 DIAGNOSIS — I6930 Unspecified sequelae of cerebral infarction: Secondary | ICD-10-CM

## 2022-05-19 NOTE — Progress Notes (Signed)
Subjective:  Patient ID: Patrick Moss, male    DOB: Mar 24, 1941  Age: 81 y.o. MRN: [id number]  CC: Hospitalization Follow-up   HPI Caprice Wasko Bockrath presents for hospital follow up due to CVA. Had two, back to back. Most recently hospitalized from 11/25 to 05/15/22  Now needing home health to provide PT. He is walking minimally - about 6 feet, usiong a walker. Left side is weak.      01/09/2022    9:02 AM 10/03/2021    8:22 AM 08/27/2021    1:19 PM  Depression screen PHQ 2/9  Decreased Interest 0 0 2  Down, Depressed, Hopeless 0 0 0  PHQ - 2 Score 0 0 2  Altered sleeping  0 0  Tired, decreased energy  0 0  Change in appetite  0 0  Feeling bad or failure about yourself   0 0  Trouble concentrating  0 0  Moving slowly or fidgety/restless  0 0  Suicidal thoughts  0 0  PHQ-9 Score  0 2  Difficult doing work/chores  Not difficult at all Not difficult at all    History Barnie has a past medical history of Borderline glaucoma of both eyes, BPH associated with nocturia, CKD (chronic kidney disease), stage II, Complication of anesthesia, Hyperlipidemia, mixed, Hypertension, Malignant neoplasm prostate ([place]) (11/2021), OA (osteoarthritis), Type 2 diabetes mellitus treated with insulin ([place]), Wears dentures, and Wears glasses.   He has a past surgical history that includes Femur IM nail (Right, 05/03/2021); Foot fracture surgery (Right); Inguinal hernia repair (Bilateral, 2007); Gold seed implant (N/A, 02/18/2022); SPACE OAR INSTILLATION (N/A, 02/18/2022); Radiology with anesthesia (N/A, 05/14/2022); IR ANGIO INTRA EXTRACRAN SEL COM CAROTID INNOMINATE UNI R MOD SED (05/14/2022); IR INTRAVSC STENT CERV CAROTID W/EMB-PROT MOD SED (05/14/2022); and IR ANGIO VERTEBRAL SEL VERTEBRAL UNI R MOD SED (05/14/2022).   His family history includes Diabetes in his brother, father, and mother.He reports that he has never smoked. He has never used smokeless tobacco. He reports that he does not drink alcohol and  does not use drugs.    ROS Review of Systems  Constitutional:  Positive for activity change. Negative for appetite change.  HENT: Negative.    Eyes:  Positive for itching. Negative for visual disturbance.  Respiratory:  Negative for cough and shortness of breath.   Cardiovascular:  Negative for chest pain and leg swelling.  Gastrointestinal:  Negative for abdominal pain, diarrhea, nausea and vomiting.  Genitourinary:  Negative for difficulty urinating.  Musculoskeletal:  Negative for arthralgias and myalgias.  Skin:  Negative for rash.  Neurological:  Positive for weakness (left side, lower extremity). Negative for headaches.  Psychiatric/Behavioral:  Negative for sleep disturbance.     Objective:  BP 137/66   Pulse 80   Temp (!) 96.6 F (35.9 C)   Ht _0  (1.676 m)   Wt 176 lb (79.8 kg)   SpO2 98%   BMI 28.41 kg/m   BP Readings from Last 3 Encounters:  05/19/22 137/66  05/15/22 120/75  05/08/22 112/63    Wt Readings from Last 3 Encounters:  05/19/22 176 lb (79.8 kg)  05/14/22 174 lb (78.9 kg)  05/07/22 177 lb (80.3 kg)     Physical Exam Constitutional:      General: He is not in acute distress.    Appearance: He is well-developed.  HENT:     Head: Normocephalic and atraumatic.     Right Ear: External ear normal.     Left Ear: External  ear normal.     Nose: Nose normal.  Eyes:     Conjunctiva/sclera: Conjunctivae normal.     Pupils: Pupils are equal, round, and reactive to light.  Cardiovascular:     Rate and Rhythm: Normal rate and regular rhythm.     Heart sounds: Normal heart sounds. No murmur heard. Pulmonary:     Effort: Pulmonary effort is normal. No respiratory distress.     Breath sounds: Normal breath sounds. No wheezing or rales.  Abdominal:     Palpations: Abdomen is soft.     Tenderness: There is no abdominal tenderness.  Musculoskeletal:        General: Normal range of motion.     Cervical back: Normal range of motion and neck supple.   Skin:    General: Skin is warm and dry.  Neurological:     Mental Status: He is alert and oriented to person, place, and time.     Motor: Weakness (LLE) present.     Coordination: Coordination abnormal.     Deep Tendon Reflexes: Reflexes are normal and symmetric.  Psychiatric:        Behavior: Behavior normal.        Thought Content: Thought content normal.        Judgment: Judgment normal.       Assessment & Plan:   Trayven was seen today for hospitalization follow-up.  Diagnoses and all orders for this visit:  Late effect of cerebrovascular accident (CVA) -     BMP8+EGFR -     CBC with Differential/Platelet       I am having Kerin L. Swartzendruber maintain his B-D ULTRAFINE III SHORT PEN, Vitamin D3, omega-3 fish oil, Trulicity, Basaglar KwikPen, True Metrix Blood Glucose Test, calcium carbonate, metFORMIN, rosuvastatin, pantoprazole, diclofenac Sodium, aspirin, and ticagrelor.  Allergies as of 05/19/2022       Reactions   Septra [sulfamethoxazole-trimethoprim] Hives        Medication List        Accurate as of May 19, 2022  8:50 PM. If you have any questions, ask your nurse or doctor.          aspirin 81 MG chewable tablet Chew 1 tablet (81 mg total) by mouth daily.   B-D ULTRAFINE III SHORT PEN 31G X 8 MM Misc Generic drug: Insulin Pen Needle USE  1  PEN  NEEDLE AS DIRECTED   Basaglar KwikPen 100 UNIT/ML Inject 30 Units into the skin daily. What changed: when to take this   Brilinta 90 MG Tabs tablet Generic drug: ticagrelor Take 1 tablet (90 mg total) by mouth 2 (two) times daily.   calcium carbonate 1500 (600 Ca) MG Tabs tablet Commonly known as: OSCAL Take 1,500 mg by mouth 2 (two) times daily with a meal.   diclofenac Sodium 1 % Gel Commonly known as: Voltaren Apply 4 g topically 4 (four) times daily as needed. For the knee   metFORMIN 500 MG 24 hr tablet Commonly known as: GLUCOPHAGE-XR TAKE 2 TABLETS EVERY DAY AFTER BREAKFAST What  changed: See the new instructions.   omega-3 fish oil 1000 MG Caps capsule Commonly known as: MAXEPA Take 1 capsule by mouth daily.   pantoprazole 40 MG tablet Commonly known as: Protonix Take 1 tablet (40 mg total) by mouth daily.   rosuvastatin 5 MG tablet Commonly known as: CRESTOR Take 1 tablet (5 mg total) by mouth daily.   True Metrix Blood Glucose Test test strip Generic drug: glucose blood TEST BLOOD  SUGAR TWO TO THREE TIMES DAILY Dx K56.25   Trulicity 1.5 WL/8.9HT Sopn Generic drug: Dulaglutide Inject 1.5 mg into the skin once a week. What changed: additional instructions   Vitamin D3 50 MCG (2000 UT) Tabs Take 1 capsule by mouth daily.         Follow-up: Return in about 1 month (around 06/19/2022).  [name], M.D.

## 2022-05-20 ENCOUNTER — Ambulatory Visit: Payer: Medicare HMO | Attending: Internal Medicine

## 2022-05-20 DIAGNOSIS — I639 Cerebral infarction, unspecified: Secondary | ICD-10-CM | POA: Diagnosis not present

## 2022-05-20 DIAGNOSIS — G464 Cerebellar stroke syndrome: Secondary | ICD-10-CM | POA: Diagnosis not present

## 2022-05-20 LAB — CBC WITH DIFFERENTIAL/PLATELET
Basophils Absolute: 0 10*3/uL (ref 0.0–0.2)
Basos: 0 %
EOS (ABSOLUTE): 0.1 10*3/uL (ref 0.0–0.4)
Eos: 1 %
Hematocrit: 31.7 % — ABNORMAL LOW (ref 37.5–51.0)
Hemoglobin: 10.8 g/dL — ABNORMAL LOW (ref 13.0–17.7)
Immature Grans (Abs): 0 10*3/uL (ref 0.0–0.1)
Immature Granulocytes: 0 %
Lymphocytes Absolute: 0.7 10*3/uL (ref 0.7–3.1)
Lymphs: 11 %
MCH: 31.3 pg (ref 26.6–33.0)
MCHC: 34.1 g/dL (ref 31.5–35.7)
MCV: 92 fL (ref 79–97)
Monocytes Absolute: 0.3 10*3/uL (ref 0.1–0.9)
Monocytes: 5 %
Neutrophils Absolute: 4.9 10*3/uL (ref 1.4–7.0)
Neutrophils: 83 %
Platelets: 264 10*3/uL (ref 150–450)
RBC: 3.45 x10E6/uL — ABNORMAL LOW (ref 4.14–5.80)
RDW: 13.7 % (ref 11.6–15.4)
WBC: 6 10*3/uL (ref 3.4–10.8)

## 2022-05-20 LAB — BMP8+EGFR
BUN/Creatinine Ratio: 10 (ref 10–24)
BUN: 13 mg/dL (ref 8–27)
CO2: 20 mmol/L (ref 20–29)
Calcium: 9.6 mg/dL (ref 8.6–10.2)
Chloride: 109 mmol/L — ABNORMAL HIGH (ref 96–106)
Creatinine, Ser: 1.27 mg/dL (ref 0.76–1.27)
Glucose: 114 mg/dL — ABNORMAL HIGH (ref 70–99)
Potassium: 4.2 mmol/L (ref 3.5–5.2)
Sodium: 145 mmol/L — ABNORMAL HIGH (ref 134–144)
eGFR: 57 mL/min/{1.73_m2} — ABNORMAL LOW (ref >59)

## 2022-05-20 NOTE — Progress Notes (Signed)
Hello Patrick Moss,  Your lab result is normal and/or stable.Some minor variations that are not significant are commonly marked abnormal, but do not represent any medical problem for you.  Best regards, Earlee Herald, M.D.

## 2022-05-21 ENCOUNTER — Other Ambulatory Visit: Payer: Self-pay | Admitting: Family Medicine

## 2022-05-21 ENCOUNTER — Ambulatory Visit: Payer: Self-pay | Admitting: *Deleted

## 2022-05-21 ENCOUNTER — Encounter: Payer: Self-pay | Admitting: *Deleted

## 2022-05-21 MED ORDER — TRULICITY 1.5 MG/0.5ML ~~LOC~~ SOAJ: 1.5000 mg | SUBCUTANEOUS | 3 refills | Status: DC

## 2022-05-21 MED ORDER — METFORMIN HCL ER 500 MG PO TB24: 1000.0000 mg | ORAL_TABLET | Freq: Every day | ORAL | Status: DC

## 2022-05-21 NOTE — Patient Outreach (Signed)
Care Coordination   Follow Up Visit Note   05/21/2022 Name: Patrick Moss MRN: [id number] DOB: 1940/10/12  Patrick Moss is a 81 y.o. year old male who sees Patrick Fraise, MD for primary care. I spoke with  Patrick Moss by phone today.  What matters to the patients Moss and wellness today?  "Doing real good"  Left side weaknesses are reported to be resolved "little problem" States he is back to independent  Activities of daily living (ADLs) except he does not drive and has some difficulty writing He confirms low literacy level 10 grade education "one room education"  Transportation  His preference is for all medical appointments to be in [place] as his family are familiar with driving in the county but not comfortable with driving in [place] especially  He does not prefer [employer] transportation use as he voices concern with "riding with strangers"  He voices concern with the reconciliation of his medications, especially making sure Patrick Moss has all his new prescriptions and discontinues his medicines stopped at hospital discharge. Permission given to RN CM to outreach to Patrick Moss  Presently he reports support from his brother who is reported to be staying with him since his hospital discharge    Goals Addressed               This Visit's Progress     Moss Stated     manage transportation to medical appointments [facility]) (pt-stated)   On track     Care Coordination Interventions: Evaluation of current treatment plan related to transportation and upcoming medical appointments for imaging, urology and pcp and Moss's adherence to plan as established by provider Provided education to Moss re: Patrick Moss transportation benefits Discussed plans with Moss for ongoing care management follow up and provided Moss with direct contact information for care management team Assessed social determinant of Moss barriers Reviewed his  medical appointments for radiology on 05/29/22, 06/03/22 to urology and pcp 06/18/22.  Encouraged him or his brother to review/read any sheets of paper given to him when leaving the hospital and doctor offices Confirmed his only concern would be the transportation for the Moss follow up in [place] in January 2024 Confirmed he does not prefer use of his [employer] transportation benefit (does not want to ride with strangers) and prefers if possible all appointments in [place] to allow family to assist Reviewed Moss insurance coverage for possible neurologists closer to him than Patrick Moss. Outreached to 2 listed offices & spoke with staff to confirm Neurologists close to Patrick Moss Patrick be Patrick Moss) & [place] (Patrick Moss) Patrick Moss & offer options for transportation       medication management [facility]) (pt-stated)   On track     Care Coordination Interventions: Evaluation of current treatment plan related to delivery medication management (Patrick Moss)  and Moss's adherence to plan as established by provider Collaborated with Patrick Moss staff, Patrick Moss, pharmacist per Moss request regarding the discontinued medication treatment at hospital discharge and his medicines that needs further refill prescriptions to include metformin, Trulicity, Protonix Discussed plans with Moss for ongoing care management follow up and provided Moss with direct contact information for care management team Screening for signs and symptoms of depression related to chronic disease state  Message to pcp/pcp RN related to the prescription refill orders needed to be faxed to Patrick Moss at [phone] (metformin, Trulicity, Protonix  SDOH assessments and interventions completed:  Yes  SDOH Interventions Today    Flowsheet Row Most Recent Value  SDOH Interventions   Housing Interventions Intervention Not Indicated   Transportation Interventions Other (Comment), Payor Benefit  [Reviewed [employer] transportation benefit]  Utilities Interventions Intervention Not Indicated  Stress Interventions Intervention Not Indicated  Social Connections Interventions Intervention Not Indicated        Care Coordination Interventions:  Yes, provided   Follow up plan: Follow up call scheduled for 06/04/22 1000    Encounter Outcome:  Pt. Visit Completed   Patrick Moss L. Patrick Hamman, RN, BSN, [place] Coordinator Office number [phone]

## 2022-05-21 NOTE — Patient Instructions (Addendum)
Visit Information  Thank you for taking time to visit with me today. Please don't hesitate to contact me if I can be of assistance to you.   Following are the goals we discussed today:   Goals Addressed               This Visit's Progress     Patient Stated     manage transportation to medical appointments [facility]) (pt-stated)   On track     Care Coordination Interventions: Evaluation of current treatment plan related to transportation and upcoming medical appointments for imaging, urology and pcp and patient's adherence to plan as established by provider Provided education to patient re: [place] transportation benefits Discussed plans with patient for ongoing care management follow up and provided patient with direct contact information for care management team Assessed social determinant of health barriers Reviewed his medical appointments for radiology on 05/29/22, 06/03/22 to urology and pcp 06/18/22.  Encouraged him or his brother to review/read any sheets of paper given to him when leaving the hospital and doctor offices Confirmed his only concern would be the transportation for the neurology follow up in [place] in January 2024 Confirmed he does not prefer use of his [employer] transportation benefit (does not want to ride with strangers) and prefers if possible all appointments in [place] to allow family to assist Reviewed patient insurance coverage for possible neurologists closer to him than [place]. Outreached to 2 listed offices & spoke with staff to confirm Neurologists close to [facility] will be [facility] neurology) & [place] (Dr Rob Foell) Will review available neurologists with patient & offer options for transportation       medication management [facility]) (pt-stated)   On track     Care Coordination Interventions: Evaluation of current treatment plan related to delivery medication management ([facility])  and patient's adherence  to plan as established by provider Collaborated with [facility] staff, [place], pharmacist per patient request regarding the discontinued medication treatment at hospital discharge and his medicines that needs further refill prescriptions to include metformin, Trulicity, Protonix Discussed plans with patient for ongoing care management follow up and provided patient with direct contact information for care management team Screening for signs and symptoms of depression related to chronic disease state  Message to pcp/pcp RN related to the prescription refill orders needed to be faxed to [place] [facility] at [phone] (metformin, Trulicity, Protonix         Our next appointment is by telephone on 06/04/22 at 1000  Please call the care guide team at [phone] if you need to cancel or reschedule your appointment.   If you are experiencing a Mental Health or [place] or need someone to talk to, please call the Suicide and Crisis Lifeline: 988 call the [place] National Suicide Prevention Lifeline: (947)257-9526 or TTY: 418-348-5108 TTY 2053861517) to talk to a trained counselor call 1-800-273-TALK (toll free, 24 hour hotline) call the [facility]: 820-035-0625 call 911   The patient verbalized understanding of instructions, educational materials, and care plan provided today and DECLINED offer to receive copy of patient instructions, educational materials, and care plan.   The patient has been provided with contact information for the care management team and has been advised to call with any health related questions or concerns.   Halaina Vanduzer L. Lavina Hamman, RN, BSN, [place] Coordinator Office number [phone]

## 2022-05-23 ENCOUNTER — Telehealth: Payer: Self-pay | Admitting: Family Medicine

## 2022-05-23 NOTE — Telephone Encounter (Signed)
Pt wants to talk to L[place]le. Pt wants to know why Dulaglutide (TRULICITY) 1.5 SE/3.9RV SOPN  is at the pharmacy. He says that he normal picks up at the office. Please call back

## 2022-05-23 NOTE — Telephone Encounter (Signed)
It looks like Dr. Livia Snellen sent this in.

## 2022-05-23 NOTE — Telephone Encounter (Signed)
Returned call We send RX to pharmacy as back up  Called patient & left VM He also has insulin to pick up here

## 2022-05-29 DIAGNOSIS — C61 Malignant neoplasm of prostate: Secondary | ICD-10-CM | POA: Diagnosis not present

## 2022-06-02 ENCOUNTER — Ambulatory Visit (HOSPITAL_COMMUNITY)
Admission: RE | Admit: 2022-06-02 | Discharge: 2022-06-02 | Disposition: A | Discharge status: Home / Self Care | Payer: Medicare HMO | Source: Ambulatory Visit | Attending: Physician Assistant | Admitting: Physician Assistant

## 2022-06-02 ENCOUNTER — Telehealth: Payer: Self-pay

## 2022-06-02 DIAGNOSIS — I6521 Occlusion and stenosis of right carotid artery: Secondary | ICD-10-CM

## 2022-06-02 DIAGNOSIS — I63231 Cerebral infarction due to unspecified occlusion or stenosis of right carotid arteries: Secondary | ICD-10-CM

## 2022-06-02 DIAGNOSIS — Z9889 Other specified postprocedural states: Secondary | ICD-10-CM | POA: Diagnosis not present

## 2022-06-02 DIAGNOSIS — I639 Cerebral infarction, unspecified: Secondary | ICD-10-CM

## 2022-06-02 NOTE — Telephone Encounter (Signed)
Received fax from [employer] stating early termination requested for the patient.

## 2022-06-03 ENCOUNTER — Telehealth: Payer: Self-pay | Admitting: *Deleted

## 2022-06-03 ENCOUNTER — Ambulatory Visit: Payer: Medicare HMO | Admitting: Urology

## 2022-06-03 VITALS — BP 147/84 | HR 75

## 2022-06-03 DIAGNOSIS — C61 Malignant neoplasm of prostate: Secondary | ICD-10-CM | POA: Diagnosis not present

## 2022-06-03 HISTORY — PX: IR RADIOLOGIST EVAL & MGMT: IMG5224

## 2022-06-03 LAB — URINALYSIS, ROUTINE W REFLEX MICROSCOPIC
Bilirubin, UA: NEGATIVE
Glucose, UA: NEGATIVE
Leukocytes,UA: NEGATIVE
Nitrite, UA: NEGATIVE
RBC, UA: NEGATIVE
Specific Gravity, UA: 1.02 (ref 1.005–1.030)
Urobilinogen, Ur: 0.2 mg/dL (ref 0.2–1.0)
pH, UA: 5.5 (ref 5.0–7.5)

## 2022-06-03 LAB — MICROSCOPIC EXAMINATION
Bacteria, UA: NONE SEEN
RBC, Urine: NONE SEEN /hpf (ref 0–2)

## 2022-06-03 NOTE — Telephone Encounter (Signed)
Patient walked in with brother wanting clarification on his medication. Some confusion and a little difficult to understand. He was d/c'd from the hospital 05/15/2022 and was seen by Dr. Livia Snellen 05/19/2022 for hospital discharge.  He asked about refills on his medications. He brought the medications with him. I informed him that refills were on file for metformin, rosuvastatin and pantoprazole.  I informed that aspirin 81 can be bought over the counter.  The only medication that needed to be addressed was Brilinta.  He is needing a refill on that soon.  The initial script was written by the hospitalist, Holli Humbles, MD.  The patient also stated that the directions for Metformin changed from two capsules to one capsule daily. This message is not urgent. Routed to Dr. Livia Snellen

## 2022-06-03 NOTE — Progress Notes (Addendum)
History of Present Illness:   6.13.2023: He underwent ultrasound and biopsy of the prostate.  PSA 13.2, prostate volume 105 mL, PSA density 0.13.  Biopsy results as follows:    9/12 cores positive-- 1 core  revealed GG 1 pattern 1 core revealed GG 2 pattern 7 cores revealed GG 3 pattern  Perineural invasion was found in 4 of the cores.  PET CT scan was performed on 6.22.2023.  This revealed no evidence of extra prostatic disease.   8.22.2023: ST ADT commenced-the patient received Firmagon 240 mg.   9.5.2023: Underwent placement of fiducial markers and SpaceOAR.   9.26.2023: He returns today for continuation of short-term androgen deprivation therapy.  03/17/2022 - 04/25/2022-received 28 fractions of EBRT  12.19.2023: Recent hospitalization for acute ischemic stroke.  Found to have right carotid stenosis.  No specific voiding complaints.  He has had no blood in his urine.  Past Medical History:  Diagnosis Date   Borderline glaucoma of both eyes    BPH associated with nocturia    CKD (chronic kidney disease), stage II    Complication of anesthesia    per pt emergent hiccups   Hyperlipidemia, mixed    Hypertension    Malignant neoplasm prostate ([place]) 11/2021   primary urologist---  dr dahlstadt/  radiation oncology [facility];  dx 06/ 2023,  Gleason 4+3,  PSA  13.2   OA (osteoarthritis)    knees   Type 2 diabetes mellitus treated with insulin ([place])    followed by pcp ( previous seen by endocriologist--- dr g. nida ,lov note in epic 02-19-2021);    (02-11-2022  pt stated checks blood sugar twice daily,  fasting sugar average 90s  to 120s)   Wears dentures    upper   Wears glasses     Past Surgical History:  Procedure Laterality Date   FEMUR IM NAIL Right 05/03/2021   '@[facility]'$ --  W-S   FOOT FRACTURE SURGERY Right    2017;   ORIF , per pt plate under foot   GOLD SEED IMPLANT N/A 02/18/2022   Procedure: GOLD SEED IMPLANT;  Surgeon: Remi Haggard, MD;   Location: [facility];  Service: Urology;  Laterality: N/A;  [place] Bilateral 2007   approx   IR ANGIO INTRA EXTRACRAN SEL COM CAROTID INNOMINATE UNI R MOD SED  05/14/2022   IR ANGIO VERTEBRAL SEL VERTEBRAL UNI R MOD SED  05/14/2022   IR INTRAVSC STENT CERV CAROTID W/EMB-PROT MOD SED INCL ANGIO  05/14/2022   IR RADIOLOGIST EVAL & MGMT  06/03/2022   RADIOLOGY WITH ANESTHESIA N/A 05/14/2022   Procedure: R ICA stent;  Surgeon: Luanne Bras, MD;  Location: [facility];  Service: Radiology;  Laterality: N/A;   SPACE OAR INSTILLATION N/A 02/18/2022   Procedure: SPACE OAR INSTILLATION;  Surgeon: Remi Haggard, MD;  [facility];  Service: Urology;  Laterality: N/A;    Home Medications:  Allergies as of 06/03/2022       Reactions   Septra [sulfamethoxazole-trimethoprim] Hives        Medication List        Accurate as of June 03, 2022  8:19 AM. If you have any questions, ask your nurse or doctor.          aspirin 81 MG chewable tablet Chew 1 tablet (81 mg total) by mouth daily.   B-D ULTRAFINE III SHORT PEN 31G X 8 MM Misc Generic drug: Insulin  Pen Needle USE  1  PEN  NEEDLE AS DIRECTED   Basaglar KwikPen 100 UNIT/ML Inject 30 Units into the skin daily. What changed: when to take this   Brilinta 90 MG Tabs tablet Generic drug: ticagrelor Take 1 tablet (90 mg total) by mouth 2 (two) times daily.   calcium carbonate 1500 (600 Ca) MG Tabs tablet Commonly known as: OSCAL Take 1,500 mg by mouth 2 (two) times daily with a meal.   diclofenac Sodium 1 % Gel Commonly known as: Voltaren Apply 4 g topically 4 (four) times daily as needed. For the knee   metFORMIN 500 MG 24 hr tablet Commonly known as: GLUCOPHAGE-XR Take 2 tablets (1,000 mg total) by mouth daily with breakfast.   omega-3 fish oil 1000 MG Caps capsule Commonly known as: MAXEPA Take 1 capsule by mouth daily.   pantoprazole 40 MG  tablet Commonly known as: Protonix Take 1 tablet (40 mg total) by mouth daily.   rosuvastatin 5 MG tablet Commonly known as: CRESTOR Take 1 tablet (5 mg total) by mouth daily.   True Metrix Blood Glucose Test test strip Generic drug: glucose blood TEST BLOOD SUGAR TWO TO THREE TIMES DAILY Dx V25.36   Trulicity 1.5 UY/4.0HK Sopn Generic drug: Dulaglutide Inject 1.5 mg into the skin once a week. Saturday's   Vitamin D3 50 MCG (2000 UT) Tabs Take 1 capsule by mouth daily.        Allergies:  Allergies  Allergen Reactions   Septra [Sulfamethoxazole-Trimethoprim] Hives    Family History  Problem Relation Age of Onset   Diabetes Mother    Diabetes Father    Diabetes Brother     Social History:  reports that he has never smoked. He has never used smokeless tobacco. He reports that he does not drink alcohol and does not use drugs.  ROS: A complete review of systems was performed.  All systems are negative except for pertinent findings as noted.  Physical Exam:  Vital signs in last 24 hours: There were no vitals taken for this visit. Constitutional:  Alert and oriented, No acute distress Cardiovascular: Regular rate  Respiratory: Normal respiratory effort Lymphatic: No lymphadenopathy Neurologic: Grossly intact, no focal deficits Psychiatric: Normal mood and affect  I have reviewed prior pt notes  I have reviewed notes from referring/previous physicians-recent hospital records reviewed  I have reviewed urinalysis results  I have independently reviewed prior imaging  I have reviewed prior PSA results     Impression/Assessment:  Grade group 3 prostate cancer, completing ST ADT, status post EBRT, from a standpoint of urologic issues doing well  Plan:  At this point I would not proceed with further leuprolide therapy  I will have him come back in 3 months following the laboratories

## 2022-06-04 ENCOUNTER — Ambulatory Visit: Payer: Self-pay | Admitting: *Deleted

## 2022-06-04 ENCOUNTER — Other Ambulatory Visit: Payer: Self-pay | Admitting: Family Medicine

## 2022-06-04 MED ORDER — TICAGRELOR 90 MG PO TABS: 90.0000 mg | ORAL_TABLET | Freq: Two times a day (BID) | ORAL | 2 refills | Status: DC

## 2022-06-04 NOTE — Patient Outreach (Signed)
  Care Coordination   Follow Up Visit Note   07/22/2022 late entry for 06/04/22 Name: Patrick Moss MRN: [id number] DOB: Jan 05, 1941  Patrick Moss is a 81 y.o. year old male who sees Patrick Fraise, MD for primary care. I spoke with  Patrick Moss by phone today.  What matters to the patients health and wellness today?  Urology   Stroke residual symptoms mouth waters. Some left side weakness.plus hip weakness. Walks with a cane. Still having therapy today He walks a lot in and out of his home, outside and in stores     Goals Addressed               This Visit's Progress     Patient Stated     manage transportation to medical appointments [facility]) (pt-stated)        Care Coordination Interventions: Evaluation of current treatment plan related to transportation and upcoming medical appointments for imaging, urology and pcp and patient's adherence to plan as established by provider Provided education to patient re: [place] transportation benefits Discussed plans with patient for ongoing care management follow up and provided patient with direct contact information for care management team Assessed social determinant of health barriers Confirmed his only concern would be the transportation for the neurology follow up in [place] in January 2024 Will review available neurologists with patient & offer options for transportation       medication management ([place]) (pt-stated)        Care Coordination Interventions: Evaluation of current treatment plan related to delivery medication management ([facility])  and patient's adherence to plan as established by provider Discussed plans with patient for ongoing care management follow up and provided patient with direct contact information for care management team        SDOH assessments and interventions completed:  Yes  SDOH Interventions Today    Flowsheet Row Most Recent Value  SDOH Interventions   Transportation  Interventions Intervention Not Indicated        Care Coordination Interventions:  Yes, provided   Follow up plan: Follow up call scheduled for 07/28/22    Encounter Outcome:  Pt. Visit Completed   Patrick Moss L. Patrick Hamman, RN, BSN, [place] Coordinator Office number [phone]

## 2022-06-05 ENCOUNTER — Other Ambulatory Visit (HOSPITAL_COMMUNITY): Payer: Self-pay

## 2022-06-06 ENCOUNTER — Other Ambulatory Visit: Payer: Self-pay | Admitting: Family Medicine

## 2022-06-18 ENCOUNTER — Ambulatory Visit (INDEPENDENT_AMBULATORY_CARE_PROVIDER_SITE_OTHER): Payer: Medicare HMO | Admitting: Family Medicine

## 2022-06-18 ENCOUNTER — Telehealth: Payer: Self-pay | Admitting: Family Medicine

## 2022-06-18 ENCOUNTER — Encounter: Payer: Self-pay | Admitting: Family Medicine

## 2022-06-18 VITALS — BP 164/66 | HR 72 | Temp 97.0°F | Ht 66.0 in | Wt 171.0 lb

## 2022-06-18 DIAGNOSIS — Z794 Long term (current) use of insulin: Secondary | ICD-10-CM

## 2022-06-18 DIAGNOSIS — E11649 Type 2 diabetes mellitus with hypoglycemia without coma: Secondary | ICD-10-CM | POA: Diagnosis not present

## 2022-06-18 MED ORDER — BASAGLAR KWIKPEN 100 UNIT/ML ~~LOC~~ SOPN: 20.0000 [IU] | PEN_INJECTOR | Freq: Every day | SUBCUTANEOUS | 4 refills | Status: DC

## 2022-06-18 NOTE — Telephone Encounter (Signed)
Patient says that his Brilinta Rx is costing him too much money. Needs help getting assistance with it.  Rosendo Gros, can you reach out to patient about this?

## 2022-06-18 NOTE — Progress Notes (Addendum)
Subjective:  Patient ID: Patrick Moss, male    DOB: 1940-12-08  Age: 82 y.o. MRN: [id number]  CC: Follow-up   HPI Rebecca L Goodenow presents for low glucose readings. Felt bad in general with lows. Decreased the basaglar from 36 to 34 to 24 to 14 units over the last several days. Now he brings in readings showing readings in 60's then climbing to 150's. Done randomly.     08/04/2022    9:40 AM 07/28/2022    8:52 PM 06/18/2022    4:09 PM  Depression screen PHQ 2/9  Decreased Interest 0 0 0  Down, Depressed, Hopeless 0 0 0  PHQ - 2 Score 0 0 0    History Tor has a past medical history of Borderline glaucoma of both eyes, BPH associated with nocturia, CKD (chronic kidney disease), stage II, Complication of anesthesia, Hyperlipidemia, mixed, Hypertension, Malignant neoplasm prostate ([place]) (11/2021), OA (osteoarthritis), Type 2 diabetes mellitus treated with insulin ([place]), Wears dentures, and Wears glasses.   He has a past surgical history that includes Femur IM nail (Right, 05/03/2021); Foot fracture surgery (Right); Inguinal hernia repair (Bilateral, 2007); Gold seed implant (N/A, 02/18/2022); SPACE OAR INSTILLATION (N/A, 02/18/2022); Radiology with anesthesia (N/A, 05/14/2022); IR ANGIO INTRA EXTRACRAN SEL COM CAROTID INNOMINATE UNI R MOD SED (05/14/2022); IR INTRAVSC STENT CERV CAROTID W/EMB-PROT MOD SED (05/14/2022); IR ANGIO VERTEBRAL SEL VERTEBRAL UNI R MOD SED (05/14/2022); and IR Radiologist Eval & Mgmt (06/03/2022).   His family history includes Diabetes in his brother, father, and mother; Stroke in his father and mother.He reports that he has never smoked. He has never used smokeless tobacco. He reports that he does not drink alcohol and does not use drugs.    ROS Review of Systems  Constitutional:  Negative for fever.  Respiratory:  Negative for shortness of breath.   Cardiovascular:  Negative for chest pain.  Musculoskeletal:  Negative for arthralgias.  Skin:  Negative for  rash.  Neurological:  Positive for weakness. Negative for dizziness and syncope.    Objective:  BP (!) 164/66   Pulse 72   Temp (!) 97 F (36.1 C)   Ht 5' 6"$  (1.676 m)   Wt 171 lb (77.6 kg)   SpO2 98%   BMI 27.60 kg/m   BP Readings from Last 3 Encounters:  08/04/22 (!) 168/96  06/23/22 (!) 141/72  06/18/22 (!) 164/66    Wt Readings from Last 3 Encounters:  08/04/22 161 lb 3.2 oz (73.1 kg)  06/23/22 167 lb 6.4 oz (75.9 kg)  06/18/22 171 lb (77.6 kg)     Physical Exam Vitals reviewed.  Constitutional:      Appearance: He is well-developed.  HENT:     Head: Normocephalic and atraumatic.     Right Ear: External ear normal.     Left Ear: External ear normal.     Mouth/Throat:     Pharynx: No oropharyngeal exudate or posterior oropharyngeal erythema.  Eyes:     Pupils: Pupils are equal, round, and reactive to light.  Cardiovascular:     Rate and Rhythm: Normal rate and regular rhythm.     Heart sounds: No murmur heard. Pulmonary:     Effort: No respiratory distress.     Breath sounds: Normal breath sounds.  Musculoskeletal:     Cervical back: Normal range of motion and neck supple.  Neurological:     Mental Status: He is alert and oriented to person, place, and time.     Motor: Weakness (  on right) present.       Assessment & Plan:   Sanchez was seen today for follow-up.  Diagnoses and all orders for this visit:  Diabetic hypoglycemia ([place]) -     AMB Referral to Pharmacy Medication Management  Other orders -     Insulin Glargine (BASAGLAR KWIKPEN) 100 UNIT/ML; Inject 20 Units into the skin daily.       I have changed Cedar L. Pfiffner's Basaglar KwikPen. I am also having him maintain his B-D ULTRAFINE III SHORT PEN, Vitamin D3, omega-3 fish oil, calcium carbonate, rosuvastatin, diclofenac Sodium, aspirin, Trulicity, metFORMIN, ticagrelor, and True Metrix Blood Glucose Test.  Allergies as of 06/18/2022       Reactions   Septra  [sulfamethoxazole-trimethoprim] Hives        Medication List        Accurate as of June 18, 2022 11:59 PM. If you have any questions, ask your nurse or doctor.          aspirin 81 MG chewable tablet Chew 1 tablet (81 mg total) by mouth daily.   B-D ULTRAFINE III SHORT PEN 31G X 8 MM Misc Generic drug: Insulin Pen Needle USE  1  PEN  NEEDLE AS DIRECTED   Basaglar KwikPen 100 UNIT/ML Inject 20 Units into the skin daily. What changed: how much to take Changed by: [name], MD   calcium carbonate 1500 (600 Ca) MG Tabs tablet Commonly known as: OSCAL Take 1,500 mg by mouth 2 (two) times daily with a meal.   diclofenac Sodium 1 % Gel Commonly known as: Voltaren Apply 4 g topically 4 (four) times daily as needed. For the knee   metFORMIN 500 MG 24 hr tablet Commonly known as: GLUCOPHAGE-XR Take 2 tablets (1,000 mg total) by mouth daily with breakfast.   omega-3 fish oil 1000 MG Caps capsule Commonly known as: MAXEPA Take 1 capsule by mouth daily.   pantoprazole 40 MG tablet Commonly known as: Protonix Take 1 tablet (40 mg total) by mouth daily.   rosuvastatin 5 MG tablet Commonly known as: CRESTOR Take 1 tablet (5 mg total) by mouth daily.   ticagrelor 90 MG Tabs tablet Commonly known as: BRILINTA Take 1 tablet (90 mg total) by mouth 2 (two) times daily.   True Metrix Blood Glucose Test test strip Generic drug: glucose blood TEST BLOOD SUGAR TWO TO THREE TIMES DAILY   Trulicity 1.5 0000000 Sopn Generic drug: Dulaglutide Inject 1.5 mg into the skin once a week. Saturday's   Vitamin D3 50 MCG (2000 UT) Tabs Generic drug: Cholecalciferol Take 1 capsule by mouth daily.         Follow-up: Return in about 7 weeks (around 08/07/2022) for diabetes.  [name], M.D.

## 2022-06-19 ENCOUNTER — Other Ambulatory Visit (HOSPITAL_COMMUNITY): Payer: Self-pay

## 2022-06-19 NOTE — Telephone Encounter (Signed)
Looks like patient rec'd medication thru [employer] order for a 3 month supply (pt says this was $134). Attempted to run a test claim but refill would be too soon (next fill 08/12/22). Pt says insurance told him this would cost the same for this year as well.  Informed him I will mail an AZ&ME application to his home for him to complete and return to office. Also gave patient SSA's phone number to apply for LIS in the meantime, which could help his copays in general if approved.

## 2022-06-23 ENCOUNTER — Encounter: Payer: Self-pay | Admitting: Family Medicine

## 2022-06-23 ENCOUNTER — Ambulatory Visit: Payer: Medicare HMO | Admitting: Adult Health

## 2022-06-23 ENCOUNTER — Encounter: Payer: Self-pay | Admitting: Adult Health

## 2022-06-23 VITALS — BP 141/72 | HR 84 | Ht 66.0 in | Wt 167.4 lb

## 2022-06-23 DIAGNOSIS — I63231 Cerebral infarction due to unspecified occlusion or stenosis of right carotid arteries: Secondary | ICD-10-CM | POA: Diagnosis not present

## 2022-06-23 DIAGNOSIS — E119 Type 2 diabetes mellitus without complications: Secondary | ICD-10-CM

## 2022-06-23 DIAGNOSIS — Z794 Long term (current) use of insulin: Secondary | ICD-10-CM | POA: Diagnosis not present

## 2022-06-23 DIAGNOSIS — I639 Cerebral infarction, unspecified: Secondary | ICD-10-CM

## 2022-06-23 NOTE — Patient Instructions (Signed)
  Your Plan:  Continue aspirin 325 mg and Bilinta 90 mg twice a day until you follow-up with Dr. Estanislado Pandy  Blood pressure goal <130/90 Cholesterol LDL goal <70 Diabetes goal A1c <7 Monitor diet   Thank you for coming to see [place] at [facility]. I hope we have been able to provide you high quality care today.  You may receive a patient satisfaction survey over the next few weeks. We would appreciate your feedback and comments so that we may continue to improve ourselves and the health of our patients.

## 2022-06-23 NOTE — Progress Notes (Signed)
PATIENT: Patrick Moss DOB: February 25, 1941  REASON FOR VISIT: follow up HISTORY FROM: patient PRIMARY NEUROLOGIST: Dr. Leonie Man  Chief Complaint  Patient presents with   Follow-up    Pt in 19 with son and brother Pt states some swallowing issues and slurred speech.      HISTORY OF PRESENT ILLNESS: Today 06/23/22  Patrick Moss is a 82 y.o. male who has been followed in this office for history of acute right corona radiata ischemic infarct due to right ICA stenosis.  During his hospitalization he did have stenting of the right ICA.  Patient reports that he still has some slurred speech and dysphagia.  States that he takes small bites and small sips of water when eating.  Denies any weakness or numbness in the extremities.  Uses a cane when ambulating.  Has remained on aspirin and Brilinta.  Has a follow-up in 6 months with Dr. Estanislado Pandy. Returns today for follow-up.   HISTORY (Copied from Dr. Erlinda Hong):  Patrick Moss is a 81 y.o. male with history of  hypertension, diabetes, chronic kidney disease stage IIIa, was recently discharged from the hospital on 11/23 after being evaluated for acute/subacute stroke of right corona radiata.  At that time, he had presented with right facial droop and slurred speech.  He was noted to have carotid stenosis on carotid ultrasound.  Seen by neurology with recommendations for outpatient follow-up with interventional radiology to be considered for stenting.  He was started on aspirin and Plavix. After returning home, he reported worsening of his dysphagia/dysarthria.  He noted that he had trouble maintaining his oral secretions causing him to drool out of the left side of his mouth. On 11/24, he began noticing weakness in his left hand.  He was having trouble holding his cane and was dropping things from his left hand.  Due to development of new symptoms, he came to the ER for evaluation.   Stroke: Acute right corona radiata ischemic infarct  Etiology:  due  to right ICA stenosis/atherosclerosis CT head  1. Focal area of low attenuation within the right corona radiata corresponding to areas of restricted diffusion on recent MRI and compatible with evolutionary changes of previously noted acute to early subacute infarcts noted on 05/07/2022. 2. Chronic small vessel ischemic disease and brain atrophy. CTA head & neck  1. Negative for large vessel occlusion. But Positive for complex plaque in the proximal Right ICA with a possible small focus of superimposed adherent thrombus (series 8, image 141 and series 4, image 113). Hemodynamically significant stenosis there estimated at 60%. And positive also for bulky bilateral calcified plaque of the Dominant Right Vertebral V4 segment with up to Severe stenosis. This is distal to the Right PICA, and the Basilar artery is patent without stenosis. 2. Additionally, mild to moderate Left ICA siphon petrous and cavernous segment atherosclerotic stenosis. MRI: New acute infarct in the right corona radiata, adjacent to the previously noted acute infarcts on 05/07/2022 2D Echo 11/22: EF 60-65%. Left ventricular diastolic parameters are consistent with Grade I diastolic dysfunction. Left atrial size was mildly dilated.  Status post right ICA stenting 11/29. LDL 24 HgbA1c 6.1 VTE prophylaxis - Lovenox aspirin 325 mg daily and clopidogrel 75 mg daily prior to admission, now on aspirin 325 mg daily and Brilinta (ticagrelor) 90 mg bid duration per IR  Therapy recommendations:  HH PT/OT  Disposition: Home today  REVIEW OF SYSTEMS: Out of a complete 14 system review of symptoms, the patient  complains only of the following symptoms, and all other reviewed systems are negative.  ALLERGIES: Allergies  Allergen Reactions   Septra [Sulfamethoxazole-Trimethoprim] Hives    HOME MEDICATIONS: Outpatient Medications Prior to Visit  Medication Sig Dispense Refill   aspirin 81 MG chewable tablet Chew 1 tablet (81 mg total) by  mouth daily. 30 tablet 11   B-D ULTRAFINE III SHORT PEN 31G X 8 MM MISC USE  1  PEN  NEEDLE AS DIRECTED 270 each 3   calcium carbonate (OSCAL) 1500 (600 Ca) MG TABS tablet Take 1,500 mg by mouth 2 (two) times daily with a meal.     Cholecalciferol (VITAMIN D3) 50 MCG (2000 UT) TABS Take 1 capsule by mouth daily.     Dulaglutide (TRULICITY) 1.5 UJ/8.1XB SOPN Inject 1.5 mg into the skin once a week. Saturday's 6 mL 3   glucose blood (TRUE METRIX BLOOD GLUCOSE TEST) test strip TEST BLOOD SUGAR TWO TO THREE TIMES DAILY 300 strip 3   Insulin Glargine (BASAGLAR KWIKPEN) 100 UNIT/ML Inject 20 Units into the skin daily. 45 mL 4   metFORMIN (GLUCOPHAGE-XR) 500 MG 24 hr tablet Take 2 tablets (1,000 mg total) by mouth daily with breakfast. 180 tablet 03   omega-3 fish oil (MAXEPA) 1000 MG CAPS capsule Take 1 capsule by mouth daily.     pantoprazole (PROTONIX) 40 MG tablet Take 1 tablet (40 mg total) by mouth daily. 30 tablet 1   rosuvastatin (CRESTOR) 5 MG tablet Take 1 tablet (5 mg total) by mouth daily. 90 tablet 2   ticagrelor (BRILINTA) 90 MG TABS tablet Take 1 tablet (90 mg total) by mouth 2 (two) times daily. 180 tablet 2   diclofenac Sodium (VOLTAREN) 1 % GEL Apply 4 g topically 4 (four) times daily as needed. For the knee (Patient not taking: Reported on 06/23/2022)     Facility-Administered Medications Prior to Visit  Medication Dose Route Frequency Provider Last Rate Last Admin   sodium phosphate (FLEET) 7-19 GM/118ML enema 1 enema  1 enema Rectal Once Remi Haggard, MD        PAST MEDICAL HISTORY: Past Medical History:  Diagnosis Date   Borderline glaucoma of both eyes    BPH associated with nocturia    CKD (chronic kidney disease), stage II    Complication of anesthesia    per pt emergent hiccups   Hyperlipidemia, mixed    Hypertension    Malignant neoplasm prostate ([place]) 11/2021   primary urologist---  dr dahlstadt/  radiation oncology [facility];  dx 06/ 2023,   Gleason 4+3,  PSA  13.2   OA (osteoarthritis)    knees   Type 2 diabetes mellitus treated with insulin ([place])    followed by pcp ( previous seen by endocriologist--- dr g. nida ,lov note in epic 02-19-2021);    (02-11-2022  pt stated checks blood sugar twice daily,  fasting sugar average 90s  to 120s)   Wears dentures    upper   Wears glasses     PAST SURGICAL HISTORY: Past Surgical History:  Procedure Laterality Date   FEMUR IM NAIL Right 05/03/2021   '@[facility]'$ --  W-S   FOOT FRACTURE SURGERY Right    2017;   ORIF , per pt plate under foot   GOLD SEED IMPLANT N/A 02/18/2022   Procedure: GOLD SEED IMPLANT;  Surgeon: Remi Haggard, MD;  [facility];  Service: Urology;  Laterality: N/A;  [place]  Bilateral 2007   approx   IR ANGIO INTRA EXTRACRAN SEL COM CAROTID INNOMINATE UNI R MOD SED  05/14/2022   IR ANGIO VERTEBRAL SEL VERTEBRAL UNI R MOD SED  05/14/2022   IR INTRAVSC STENT CERV CAROTID W/EMB-PROT MOD SED INCL ANGIO  05/14/2022   IR RADIOLOGIST EVAL & MGMT  06/03/2022   RADIOLOGY WITH ANESTHESIA N/A 05/14/2022   Procedure: R ICA stent;  Surgeon: Luanne Bras, MD;  Location: [place];  Service: Radiology;  Laterality: N/A;   SPACE OAR INSTILLATION N/A 02/18/2022   Procedure: SPACE OAR INSTILLATION;  Surgeon: Remi Haggard, MD;  Location[facility];  Service: Urology;  Laterality: N/A;    FAMILY HISTORY: Family History  Problem Relation Age of Onset   Diabetes Mother    Stroke Mother    Diabetes Father    Stroke Father    Diabetes Brother     SOCIAL HISTORY: Social History   Socioeconomic History   Marital status: Divorced    Spouse name: Not on file   Number of children: 2   Years of education: 10   Highest education level: 10th grade  Occupational History   Occupation: retired  Tobacco Use   Smoking status: Never   Smokeless tobacco: Never  Scientific laboratory technician Use: Never used  Substance  and Sexual Activity   Alcohol use: No    Alcohol/week: 0.0 standard drinks of alcohol   Drug use: Never   Sexual activity: Not on file  Other Topics Concern   Not on file  Social History Narrative   Lives alone - son lives next door   [place] friend on his street - they check on each other several times per day and spend time together   Social Determinants of Health   Financial Resource Strain: Low Risk  (10/03/2021)   Overall Financial Resource Strain (CARDIA)    Difficulty of Paying Living Expenses: Not hard at all  Food Insecurity: No Food Insecurity (05/07/2022)   Hunger Vital Sign    Worried About Running Out of Food in the Last Year: Never true    [place] in the Last Year: Never true  Transportation Needs: No Transportation Needs (06/04/2022)   PRAPARE - Hydrologist (Medical): No    Lack of Transportation (Non-Medical): No  Physical Activity: Insufficiently Active (10/03/2021)   Exercise Vital Sign    Days of Exercise per Week: 7 days    Minutes of Exercise per Session: 20 min  Stress: No Stress Concern Present (05/21/2022)   [place]    Feeling of Stress : Not at all  Social Connections: Moderately Integrated (05/21/2022)   Social Connection and Isolation Panel [NHANES]    Frequency of Communication with Friends and Family: More than three times a week    Frequency of Social Gatherings with Friends and Family: More than three times a week    Attends Religious Services: 1 to 4 times per year    Active Member of [employer] or Organizations: Yes    Attends Archivist Meetings: 1 to 4 times per year    Marital Status: Divorced  Intimate Partner Violence: Not At Risk (05/07/2022)   Humiliation, Afraid, Rape, and Kick questionnaire    Fear of Current or Ex-Partner: No    Emotionally Abused: No    Physically Abused: No    Sexually Abused: No      PHYSICAL  EXAM  Vitals:   06/23/22 0846  BP: (!) 141/72  Pulse: 84  Weight: 167 lb 6.4 oz (75.9 kg)  Height: '5\' 6"'$  (1.676 m)   Body mass index is 27.02 kg/m.  Generalized: Well developed, in no acute distress   Neurological examination  Mentation: Alert oriented to time, place, history taking. Follows all commands speech and language fluent Cranial nerve II-XII: Pupils were equal round reactive to light. Extraocular movements were full, visual field were full on confrontational test. Facial sensation and strength were normal. Uvula tongue midline. Head turning and shoulder shrug  were normal and symmetric. Motor: The motor testing reveals 5 over 5 strength of all 4 extremities. Good symmetric motor tone is noted throughout.  Sensory: Sensory testing is intact to soft touch on all 4 extremities. No evidence of extinction is noted.  Coordination: Cerebellar testing reveals good finger-nose-finger and heel-to-shin bilaterally.  Gait and station: Gait is normal. Tandem gait is normal. Romberg is negative. No drift is seen.  Reflexes: Deep tendon reflexes are symmetric and normal bilaterally.   DIAGNOSTIC DATA (LABS, IMAGING, TESTING) - I reviewed patient records, labs, notes, testing and imaging myself where available.  Lab Results  Component Value Date   WBC 6.0 05/19/2022   HGB 10.8 (L) 05/19/2022   HCT 31.7 (L) 05/19/2022   MCV 92 05/19/2022   PLT 264 05/19/2022      Component Value Date/Time   NA 145 (H) 05/19/2022 0908   K 4.2 05/19/2022 0908   CL 109 (H) 05/19/2022 0908   CO2 20 05/19/2022 0908   GLUCOSE 114 (H) 05/19/2022 0908   GLUCOSE 163 (H) 05/15/2022 0054   BUN 13 05/19/2022 0908   CREATININE 1.27 05/19/2022 0908   CALCIUM 9.6 05/19/2022 0908   PROT 6.9 05/10/2022 0505   PROT 7.0 01/09/2022 0933   ALBUMIN 3.9 05/10/2022 0505   ALBUMIN 4.6 01/09/2022 0933   AST 24 05/10/2022 0505   ALT 30 05/10/2022 0505   ALKPHOS 60 05/10/2022 0505   BILITOT 0.7 05/10/2022 0505    BILITOT 0.5 01/09/2022 0933   GFRNONAA 50 (L) 05/15/2022 0054   GFRAA 52 (L) 07/26/2020 0901   Lab Results  Component Value Date   CHOL 92 05/08/2022   HDL 39 (L) 05/08/2022   LDLCALC 24 05/08/2022   TRIG 144 05/08/2022   CHOLHDL 2.4 05/08/2022   Lab Results  Component Value Date   HGBA1C 6.1 (H) 05/07/2022   No results found for: "VITAMINB12" Lab Results  Component Value Date   TSH 4.394 05/07/2022      ASSESSMENT AND PLAN 82 y.o. year old male  has a past medical history of Borderline glaucoma of both eyes, BPH associated with nocturia, CKD (chronic kidney disease), stage II, Complication of anesthesia, Hyperlipidemia, mixed, Hypertension, Malignant neoplasm prostate ([place]) (11/2021), OA (osteoarthritis), Type 2 diabetes mellitus treated with insulin ([place]), Wears dentures, and Wears glasses. here with:  Stroke: Acute right corona radiata ischemic infarct. Etiology:  due to right ICA stenosis/atherosclerosis     Continue Aspirin 325 mg daily and Brilinta 90 mg BID ( as instructed by IR)  for secondary stroke prevention.  Discussed secondary stroke prevention measures and importance of close PCP follow up for aggressive stroke risk factor management. I have gone over the pathophysiology of stroke, warning signs and symptoms, risk factors and their management in some detail with instructions to go to the closest emergency room for symptoms of concern. HTN: BP goal <130/90.   HLD: LDL goal <70.  Recent LDL 24.  Continue Crestor 5 mg  DMII: A1c goal<7.0. Recent A1c 6.1.  Encouraged patient to monitor diet  Keep appointment in 6 months with Dr. Estanislado Pandy FU with our office 6 months-patient requested a MyChart visit due to their drive     Ward Givens, MSN, NP-C 06/23/2022, 9:13 AM [facility] [address] [place], [place] [address] [phone]

## 2022-06-24 NOTE — Progress Notes (Signed)
I agree with the above plan 

## 2022-07-01 ENCOUNTER — Other Ambulatory Visit: Payer: Self-pay | Admitting: Family Medicine

## 2022-07-01 ENCOUNTER — Telehealth: Payer: Self-pay | Admitting: Family Medicine

## 2022-07-01 MED ORDER — PANTOPRAZOLE SODIUM 40 MG PO TBEC: 40.0000 mg | DELAYED_RELEASE_TABLET | Freq: Every day | ORAL | 3 refills | Status: DC

## 2022-07-01 NOTE — Telephone Encounter (Signed)
Left vm for pt to callback 

## 2022-07-01 NOTE — Telephone Encounter (Signed)
Please let the patient know that I sent their prescription to their pharmacy. Thanks, WS 

## 2022-07-01 NOTE — Telephone Encounter (Signed)
  Prescription Request  07/01/2022  Is this a "Controlled Substance" medicine? Not sure  Have you seen your PCP in the last 2 weeks? no  If YES, route message to pool  -  If NO, patient needs to be scheduled for appointment.  What is the name of the medication or equipment? Pantoprazole '40mg'$   Have you contacted your pharmacy to request a refill? no   Which pharmacy would you like this sent to? [facility]   Patient notified that their request is being sent to the clinical staff for review and that they should receive a response within 2 business days.    [name]' pt.  He said that he got this med at [facility].  Please call pt.

## 2022-07-02 ENCOUNTER — Telehealth: Payer: Self-pay | Admitting: Family Medicine

## 2022-07-02 MED ORDER — PANTOPRAZOLE SODIUM 40 MG PO TBEC: 40.0000 mg | DELAYED_RELEASE_TABLET | Freq: Every day | ORAL | 3 refills | Status: DC

## 2022-07-02 NOTE — Telephone Encounter (Signed)
Pt at [facility], refill was sent to mail order, pt needed sent to [facility]. RF sent. [facility] called & RF cancelled w/ them.

## 2022-07-22 NOTE — Patient Instructions (Addendum)
Visit Information  Thank you for taking time to visit with me today. Please don't hesitate to contact me if I can be of assistance to you.   Following are the goals we discussed today:   Goals Addressed               This Visit's Progress     Patient Stated     manage transportation to medical appointments [facility]) (pt-stated)        Care Coordination Interventions: Evaluation of current treatment plan related to transportation and upcoming medical appointments for imaging, urology and pcp and patient's adherence to plan as established by provider Provided education to patient re: [place] transportation benefits Discussed plans with patient for ongoing care management follow up and provided patient with direct contact information for care management team Assessed social determinant of health barriers Confirmed his only concern would be the transportation for the neurology follow up in [place] in January 2024 Will review available neurologists with patient & offer options for transportation       medication management [facility]) (pt-stated)        Care Coordination Interventions: Evaluation of current treatment plan related to delivery medication management ([facility])  and patient's adherence to plan as established by provider Discussed plans with patient for ongoing care management follow up and provided patient with direct contact information for care management team        Our next appointment is by telephone on 07/28/22 at 1030  Please call the care guide team at [phone] if you need to cancel or reschedule your appointment.   If you are experiencing a Mental Health or [place] or need someone to talk to, please call the Suicide and Crisis Lifeline: 988 call the [place] National Suicide Prevention Lifeline: (857) 174-0395 or TTY: 980-036-0278 TTY 269-169-9017) to talk to a trained counselor call 1-800-273-TALK (toll free, 24 hour hotline) go to  [facility] [address] [place] [phone]) call 911   The patient verbalized understanding of instructions, educational materials, and care plan provided today and DECLINED offer to receive copy of patient instructions, educational materials, and care plan.   The patient has been provided with contact information for the care management team and has been advised to call with any health related questions or concerns.     Sunday Klos L. Lavina Hamman, RN, BSN, [name] Coordinator Office number [phone]

## 2022-07-28 ENCOUNTER — Ambulatory Visit: Payer: Self-pay | Admitting: *Deleted

## 2022-07-28 ENCOUNTER — Encounter: Payer: Self-pay | Admitting: *Deleted

## 2022-07-28 NOTE — Patient Instructions (Addendum)
Visit Information  Thank you for taking time to visit with me today. Please don't hesitate to contact me if I can be of assistance to you.   Following are the goals we discussed today:   Goals Addressed               This Visit's Progress     Patient Stated     manage transportation to medical appointments [facility]) (pt-stated)   On track     Care Coordination Interventions: Evaluation of current treatment plan related to transportation and upcoming medical appointments for imaging, urology and pcp and patient's adherence to plan as established by provider Provided education to patient re: [facility] medicare transportation benefits Discussed plans with patient for ongoing care management follow up and provided patient with direct contact information for care management team Assessed social determinant of health barriers Confirmed his only concern would be the transportation for the neurology follow up in [place] in January 2024 Will review available neurologists with patient & offer options for transportation       medication management ([place]) (pt-stated)   On track     Care Coordination Interventions: Evaluation of current treatment plan related to delivery medication management ([facility])  and patient's adherence to plan as established by provider Discussed plans with patient for ongoing care management follow up and provided patient with direct contact information for care management team Confirmed he is obtaining assistance with his prescriptions with assistance of his pcp pharmacy staff Discussed his annual beginning of the year insurance coverage deductible       nasal congestion management ([facility]) (pt-stated)   Not on track     Care Coordination Interventions: Patient interviewed about adult health maintenance status including  covid precautions, nasal congestion management Provided education about diabetic tussin & coricidin to prevent from causing elevations of his blood  sugar and blood pressure.  Assessed his symptoms (nasal congestion, runny nose)  Answered questions        Our next appointment is by telephone on 09/05/22 at 1030  Please call the care guide team at [phone] if you need to cancel or reschedule your appointment.   If you are experiencing a Mental Health or [place] or need someone to talk to, please call the Suicide and Crisis Lifeline: 988 call the [place] National Suicide Prevention Lifeline: 856-610-7980 or TTY: (903)754-9507 TTY (774) 176-3407) to talk to a trained counselor call 1-800-273-TALK (toll free, 24 hour hotline) go to [facility] [address], [place] [phone]) call 911   The patient verbalized understanding of instructions, educational materials, and care plan provided today and DECLINED offer to receive copy of patient instructions, educational materials, and care plan.   The patient has been provided with contact information for the care management team and has been advised to call with any health related questions or concerns.    Cederic Mozley L. Lavina Hamman, RN, BSN, [place] Coordinator Office number [phone]

## 2022-07-28 NOTE — Patient Outreach (Signed)
  Care Coordination   Follow Up Visit Note   07/28/2022 Name: Patrick Moss MRN: [id number] DOB: [birth year]  Patrick Moss is a 82 y.o. year old male who sees Patrick Fraise, MD for primary care. I spoke with  Patrick Moss by phone today.  What matters to the patients health and wellness today?  Reports respiratory symptoms which include some nasal congestion and nasal runny nose. Also his brother & other church members are "sick" with the same symptoms  06/23/22 has a pcp visit  Appetite decreased but taking supplemental energy drinks  Brother stays with him and will be assisting with MD appointments via telehealth  Cbg values are good  Medication assistance  He confirmed he is getting medication assistance from his pcp pharmacy staff  He reports fear of completing the application because it may affect his medication assistance he is receiving for his insulin    Agrees to have CM speak with his pcp pharmacy staff   Goals Addressed               This Visit's Progress     Patient Stated     manage transportation to medical appointments Patrick Moss) (pt-stated)   On track     Care Coordination Interventions: Evaluation of current treatment plan related to transportation and upcoming medical appointments for imaging, urology and pcp and patient's adherence to plan as established by provider Provided education to patient re: [employer] transportation benefits Discussed plans with patient for ongoing care management follow up and provided patient with direct contact information for care management team Assessed social determinant of health barriers Confirmed his only concern would be the transportation for the neurology follow up in [place] in January 2024 Will review available neurologists with patient & offer options for transportation       medication management ([facility]) (pt-stated)   On track     Care Coordination Interventions: Evaluation of current treatment plan  related to delivery medication management ([place] well)  and patient's adherence to plan as established by provider Discussed plans with patient for ongoing care management follow up and provided patient with direct contact information for care management team Confirmed he is obtaining assistance with his prescriptions with assistance of his pcp pharmacy staff Discussed his annual beginning of the year insurance coverage deductible       nasal congestion management ([facility]) (pt-stated)   Not on track     Care Coordination Interventions: Patient interviewed about adult health maintenance status including  covid precautions, nasal congestion management Provided education about diabetic tussin & coricidin to prevent from causing elevations of his blood sugar and blood pressure.  Assessed his symptoms (nasal congestion, runny nose)  Answered questions        SDOH assessments and interventions completed:  Yes  SDOH Interventions Today    Flowsheet Row Most Recent Value  SDOH Interventions   Food Insecurity Interventions Intervention Not Indicated  Housing Interventions Intervention Not Indicated  Transportation Interventions Intervention Not Indicated  Utilities Interventions Intervention Not Indicated  Financial Strain Interventions Intervention Not Indicated  Stress Interventions Intervention Not Indicated  Social Connections Interventions Intervention Not Indicated        Care Coordination Interventions:  Yes, provided   Follow up plan: Follow up call scheduled for 09/05/22    Encounter Outcome:  Pt. Visit Completed   Patrick Moss L. Lavina Hamman, RN, BSN, [place] Coordinator Office number [phone]

## 2022-07-29 ENCOUNTER — Ambulatory Visit (INDEPENDENT_AMBULATORY_CARE_PROVIDER_SITE_OTHER): Payer: Medicare HMO

## 2022-07-29 DIAGNOSIS — M1990 Unspecified osteoarthritis, unspecified site: Secondary | ICD-10-CM

## 2022-07-29 DIAGNOSIS — E6609 Other obesity due to excess calories: Secondary | ICD-10-CM

## 2022-07-29 DIAGNOSIS — I152 Hypertension secondary to endocrine disorders: Secondary | ICD-10-CM

## 2022-07-29 DIAGNOSIS — K219 Gastro-esophageal reflux disease without esophagitis: Secondary | ICD-10-CM

## 2022-07-29 DIAGNOSIS — E1122 Type 2 diabetes mellitus with diabetic chronic kidney disease: Secondary | ICD-10-CM | POA: Diagnosis not present

## 2022-07-29 DIAGNOSIS — N1831 Chronic kidney disease, stage 3a: Secondary | ICD-10-CM | POA: Diagnosis not present

## 2022-07-29 DIAGNOSIS — M810 Age-related osteoporosis without current pathological fracture: Secondary | ICD-10-CM | POA: Diagnosis not present

## 2022-07-29 DIAGNOSIS — E1165 Type 2 diabetes mellitus with hyperglycemia: Secondary | ICD-10-CM | POA: Diagnosis not present

## 2022-07-29 DIAGNOSIS — E1159 Type 2 diabetes mellitus with other circulatory complications: Secondary | ICD-10-CM | POA: Diagnosis not present

## 2022-07-29 DIAGNOSIS — Z48812 Encounter for surgical aftercare following surgery on the circulatory system: Secondary | ICD-10-CM

## 2022-07-29 DIAGNOSIS — N400 Enlarged prostate without lower urinary tract symptoms: Secondary | ICD-10-CM | POA: Diagnosis not present

## 2022-07-29 DIAGNOSIS — R1310 Dysphagia, unspecified: Secondary | ICD-10-CM

## 2022-07-29 DIAGNOSIS — E782 Mixed hyperlipidemia: Secondary | ICD-10-CM

## 2022-08-04 ENCOUNTER — Encounter: Payer: Self-pay | Admitting: Family Medicine

## 2022-08-04 ENCOUNTER — Ambulatory Visit (INDEPENDENT_AMBULATORY_CARE_PROVIDER_SITE_OTHER): Payer: Medicare HMO | Admitting: Family Medicine

## 2022-08-04 VITALS — BP 168/96 | HR 87 | Temp 97.7°F | Ht 66.0 in | Wt 161.2 lb

## 2022-08-04 DIAGNOSIS — E1159 Type 2 diabetes mellitus with other circulatory complications: Secondary | ICD-10-CM | POA: Diagnosis not present

## 2022-08-04 DIAGNOSIS — E1149 Type 2 diabetes mellitus with other diabetic neurological complication: Secondary | ICD-10-CM | POA: Diagnosis not present

## 2022-08-04 DIAGNOSIS — I152 Hypertension secondary to endocrine disorders: Secondary | ICD-10-CM | POA: Diagnosis not present

## 2022-08-04 DIAGNOSIS — E782 Mixed hyperlipidemia: Secondary | ICD-10-CM | POA: Diagnosis not present

## 2022-08-04 LAB — BAYER DCA HB A1C WAIVED: HB A1C (BAYER DCA - WAIVED): 6.4 % — ABNORMAL HIGH (ref 4.8–5.6)

## 2022-08-04 NOTE — Progress Notes (Signed)
Subjective:  Patient ID: Patrick Moss,  male    DOB: 01/22/1941  Age: 82 y.o.    CC: Medical Management of Chronic Issues   HPI Eulalio L Shad presents for  follow-up of hypertension. Patient has no history of headache chest pain or shortness of breath or recent cough. Patient also denies symptoms of TIA such as numbness weakness lateralizing. Patient denies side effects from medication. States taking it regularly.  Patient also  in for follow-up of elevated cholesterol. Doing well without complaints on current medication. Denies side effects  including myalgia and arthralgia and nausea. Also in today for liver function testing. Currently no chest pain, shortness of breath or other cardiovascular related symptoms noted.  Follow-up of diabetes. Patient does check blood sugar at home. Readings run between 70 and 150 Patient denies symptoms such as excessive hunger or urinary frequency, excessive hunger, nausea No significant hypoglycemic spells noted. No symptoms.  Medications reviewed. Pt reports taking them regularly. Pt. denies complication/adverse reaction today.    History Kirkwood has a past medical history of Borderline glaucoma of both eyes, BPH associated with nocturia, CKD (chronic kidney disease), stage II, Complication of anesthesia, Hyperlipidemia, mixed, Hypertension, Malignant neoplasm prostate ([place]) (11/2021), OA (osteoarthritis), Type 2 diabetes mellitus treated with insulin ([place]), Wears dentures, and Wears glasses.   He has a past surgical history that includes Femur IM nail (Right, 05/03/2021); Foot fracture surgery (Right); Inguinal hernia repair (Bilateral, 2007); Gold seed implant (N/A, 02/18/2022); SPACE OAR INSTILLATION (N/A, 02/18/2022); Radiology with anesthesia (N/A, 05/14/2022); IR ANGIO INTRA EXTRACRAN SEL COM CAROTID INNOMINATE UNI R MOD SED (05/14/2022); IR INTRAVSC STENT CERV CAROTID W/EMB-PROT MOD SED (05/14/2022); IR ANGIO VERTEBRAL SEL VERTEBRAL UNI R MOD SED  (05/14/2022); and IR Radiologist Eval & Mgmt (06/03/2022).   His family history includes Diabetes in his brother, father, and mother; Stroke in his father and mother.He reports that he has never smoked. He has never used smokeless tobacco. He reports that he does not drink alcohol and does not use drugs.  Current Outpatient Medications on File Prior to Visit  Medication Sig Dispense Refill   aspirin 81 MG chewable tablet Chew 1 tablet (81 mg total) by mouth daily. 30 tablet 11   B-D ULTRAFINE III SHORT PEN 31G X 8 MM MISC USE  1  PEN  NEEDLE AS DIRECTED 270 each 3   calcium carbonate (OSCAL) 1500 (600 Ca) MG TABS tablet Take 1,500 mg by mouth 2 (two) times daily with a meal.     Cholecalciferol (VITAMIN D3) 50 MCG (2000 UT) TABS Take 1 capsule by mouth daily.     Dulaglutide (TRULICITY) 1.5 0000000 SOPN Inject 1.5 mg into the skin once a week. Saturday's 6 mL 3   glucose blood (TRUE METRIX BLOOD GLUCOSE TEST) test strip TEST BLOOD SUGAR TWO TO THREE TIMES DAILY 300 strip 3   Insulin Glargine (BASAGLAR KWIKPEN) 100 UNIT/ML Inject 20 Units into the skin daily. 45 mL 4   metFORMIN (GLUCOPHAGE-XR) 500 MG 24 hr tablet Take 2 tablets (1,000 mg total) by mouth daily with breakfast. 180 tablet 03   omega-3 fish oil (MAXEPA) 1000 MG CAPS capsule Take 1 capsule by mouth daily.     pantoprazole (PROTONIX) 40 MG tablet Take 1 tablet (40 mg total) by mouth daily. 90 tablet 3   rosuvastatin (CRESTOR) 5 MG tablet Take 1 tablet (5 mg total) by mouth daily. 90 tablet 2   ticagrelor (BRILINTA) 90 MG TABS tablet Take 1 tablet (90 mg  total) by mouth 2 (two) times daily. 180 tablet 2   diclofenac Sodium (VOLTAREN) 1 % GEL Apply 4 g topically 4 (four) times daily as needed. For the knee (Patient not taking: Reported on 06/23/2022)     Current Facility-Administered Medications on File Prior to Visit  Medication Dose Route Frequency Provider Last Rate Last Admin   sodium phosphate (FLEET) 7-19 GM/118ML enema 1 enema  1  enema Rectal Once Remi Haggard, MD        ROS Review of Systems  Constitutional:  Negative for fever.  Respiratory:  Negative for shortness of breath.   Cardiovascular:  Negative for chest pain.  Musculoskeletal:  Negative for arthralgias.  Skin:  Negative for rash.    Objective:  BP (!) 168/96   Pulse 87   Temp 97.7 F (36.5 C)   Ht 5' 6"$  (1.676 m)   Wt 161 lb 3.2 oz (73.1 kg)   SpO2 99%   BMI 26.02 kg/m   BP Readings from Last 3 Encounters:  08/04/22 (!) 168/96  06/23/22 (!) 141/72  06/18/22 (!) 164/66    Wt Readings from Last 3 Encounters:  08/04/22 161 lb 3.2 oz (73.1 kg)  06/23/22 167 lb 6.4 oz (75.9 kg)  06/18/22 171 lb (77.6 kg)     Physical Exam Vitals reviewed.  Constitutional:      Appearance: He is well-developed.  HENT:     Head: Normocephalic and atraumatic.     Right Ear: External ear normal.     Left Ear: External ear normal.     Mouth/Throat:     Pharynx: No oropharyngeal exudate or posterior oropharyngeal erythema.  Eyes:     Pupils: Pupils are equal, round, and reactive to light.  Cardiovascular:     Rate and Rhythm: Normal rate and regular rhythm.     Heart sounds: No murmur heard. Pulmonary:     Effort: No respiratory distress.     Breath sounds: Normal breath sounds.  Musculoskeletal:     Cervical back: Normal range of motion and neck supple.  Neurological:     Mental Status: He is alert and oriented to person, place, and time.     Diabetic Foot Exam - Simple   Simple Foot Form Diabetic Foot exam was performed with the following findings: Yes 08/04/2022 10:37 AM  Visual Inspection No deformities, no ulcerations, no other skin breakdown bilaterally: Yes Sensation Testing Intact to touch and monofilament testing bilaterally: Yes Pulse Check Posterior Tibialis and Dorsalis pulse intact bilaterally: Yes Comments     Lab Results  Component Value Date   HGBA1C 6.1 (H) 05/07/2022   HGBA1C 6.1 (H) 01/09/2022   HGBA1C 6.6  (H) 08/27/2021    Assessment & Plan:   Suave was seen today for medical management of chronic issues.  Diagnoses and all orders for this visit:  Hypertension associated with diabetes ([place]) -     CBC with Differential/Platelet -     CMP14+EGFR  Mixed hyperlipidemia -     Lipid panel  Type 2 diabetes mellitus with neurological complications ([place]) -     Bayer DCA Hb A1c Waived -     Microalbumin / creatinine urine ratio   I am having Jahmere L. Dempsey maintain his B-D ULTRAFINE III SHORT PEN, Vitamin D3, omega-3 fish oil, calcium carbonate, rosuvastatin, diclofenac Sodium, aspirin, Trulicity, metFORMIN, ticagrelor, True Metrix Blood Glucose Test, Basaglar KwikPen, and pantoprazole.  No orders of the defined types were placed in this encounter.  Follow-up: No follow-ups on file.  [name], M.D.

## 2022-08-04 NOTE — Addendum Note (Signed)
Addended by: [name] on: 08/04/2022 10:34 AM   Modules accepted: Orders

## 2022-08-05 LAB — CMP14+EGFR
ALT: 25 IU/L (ref 0–44)
AST: 18 IU/L (ref 0–40)
Albumin/Globulin Ratio: 1.7 (ref 1.2–2.2)
Albumin: 4 g/dL (ref 3.8–4.8)
Alkaline Phosphatase: 107 IU/L (ref 44–121)
BUN/Creatinine Ratio: 16 (ref 10–24)
BUN: 17 mg/dL (ref 8–27)
Bilirubin Total: 0.3 mg/dL (ref 0.0–1.2)
CO2: 23 mmol/L (ref 20–29)
Calcium: 9.9 mg/dL (ref 8.6–10.2)
Chloride: 106 mmol/L (ref 96–106)
Creatinine, Ser: 1.04 mg/dL (ref 0.76–1.27)
Globulin, Total: 2.4 g/dL (ref 1.5–4.5)
Glucose: 99 mg/dL (ref 70–99)
Potassium: 4.7 mmol/L (ref 3.5–5.2)
Sodium: 143 mmol/L (ref 134–144)
Total Protein: 6.4 g/dL (ref 6.0–8.5)
eGFR: 73 mL/min/{1.73_m2} (ref >59)

## 2022-08-05 LAB — CBC WITH DIFFERENTIAL/PLATELET
Basophils Absolute: 0 10*3/uL (ref 0.0–0.2)
Basos: 0 %
EOS (ABSOLUTE): 0.1 10*3/uL (ref 0.0–0.4)
Eos: 1 %
Hematocrit: 42.8 % (ref 37.5–51.0)
Hemoglobin: 14.4 g/dL (ref 13.0–17.7)
Immature Grans (Abs): 0 10*3/uL (ref 0.0–0.1)
Immature Granulocytes: 0 %
Lymphocytes Absolute: 0.9 10*3/uL (ref 0.7–3.1)
Lymphs: 15 %
MCH: 31.3 pg (ref 26.6–33.0)
MCHC: 33.6 g/dL (ref 31.5–35.7)
MCV: 93 fL (ref 79–97)
Monocytes Absolute: 0.3 10*3/uL (ref 0.1–0.9)
Monocytes: 6 %
Neutrophils Absolute: 4.5 10*3/uL (ref 1.4–7.0)
Neutrophils: 78 %
Platelets: 233 10*3/uL (ref 150–450)
RBC: 4.6 x10E6/uL (ref 4.14–5.80)
RDW: 12.5 % (ref 11.6–15.4)
WBC: 5.9 10*3/uL (ref 3.4–10.8)

## 2022-08-05 LAB — LIPID PANEL
Chol/HDL Ratio: 2.1 ratio (ref 0.0–5.0)
Cholesterol, Total: 105 mg/dL (ref 100–199)
HDL: 50 mg/dL (ref >39)
LDL Chol Calc (NIH): 33 mg/dL (ref 0–99)
Triglycerides: 122 mg/dL (ref 0–149)
VLDL Cholesterol Cal: 22 mg/dL (ref 5–40)

## 2022-08-05 LAB — MICROALBUMIN / CREATININE URINE RATIO
Creatinine, Urine: 149.1 mg/dL
Microalb/Creat Ratio: 9 mg/g creat (ref 0–29)
Microalbumin, Urine: 13.5 ug/mL

## 2022-08-05 NOTE — Progress Notes (Signed)
Hello Teige,  Your lab result is normal and/or stable.Some minor variations that are not significant are commonly marked abnormal, but do not represent any medical problem for you.  Best regards, [name], M.D.

## 2022-08-06 ENCOUNTER — Telehealth: Payer: Self-pay

## 2022-08-06 NOTE — Progress Notes (Signed)
  Care Coordination  Note  08/06/2022 Name: Patrick Moss MRN: [id number] DOB: 09-29-1940  Patrick Moss is a 82 y.o. year old male who is a primary care patient of [name], [name], MD. I reached out to [employer] by phone today to offer care coordination services.      [name] was given information about Care Coordination services today including:  The Care Coordination services include support from the care team which includes your Nurse Coordinator, Clinical Social Worker, or Pharmacist.  The Care Coordination team is here to help remove barriers to the health concerns and goals most important to you. Care Coordination services are voluntary and the patient may decline or stop services at any time by request to their care team member.   Patient agreed to services and verbal consent obtained.   Follow up plan: Telephone appointment with care coordination team member scheduled for:08/08/2022  [name], [place], [place] [address] Direct Dial: [phone] [name]

## 2022-08-06 NOTE — Progress Notes (Signed)
  Care Management   Outreach Note  08/06/2022 Name: Patrick Moss MRN: [id number] DOB: [birth year]  An unsuccessful telephone outreach was attempted today to contact the patient about Chronic Care Management needs.    Follow Up Plan:  A HIPAA compliant phone message was left for the patient providing contact information and requesting a return call.  The care management team will reach out to the patient again over the next 7 days.  If patient returns call to provider office, please advise to call [name] * at [name], [name], [name] [address] Direct Dial: [phone] [email or web address]

## 2022-08-08 ENCOUNTER — Telehealth: Payer: Medicare HMO

## 2022-08-12 ENCOUNTER — Telehealth: Payer: Self-pay | Admitting: Family Medicine

## 2022-08-12 ENCOUNTER — Telehealth: Payer: Self-pay

## 2022-08-12 NOTE — Progress Notes (Signed)
  Care Coordination Note  08/12/2022 Name: GURPREET GAUNCE MRN: [id number] DOB: Dec 06, 1940  [name] is a 82 y.o. year old male who is a primary care patient of [name], [name], MD and is actively engaged with the Chronic Care Management team. I reached out to [place] by phone today to assist with re-scheduling a follow up visit with the Pharmacist  Follow up plan: Unsuccessful telephone outreach attempt made. A HIPAA compliant phone message was left for the patient providing contact information and requesting a return call.  The care management team will reach out to the patient again over the next 7 days.  If patient returns call to provider office, please advise to call [facility]  at [place], [place], [place] [address] Direct Dial: [phone] [email or web address]

## 2022-08-12 NOTE — Telephone Encounter (Signed)
We called this patient several times! He can reschedule / not urgent

## 2022-08-12 NOTE — Progress Notes (Signed)
  Care Coordination Note  08/12/2022 Name: TRIPPER SCHNEEKLOTH MRN: [id number] DOB: 10/08/1940  [name] is a 82 y.o. year old male who is a primary care patient of [name], [name], MD and is actively engaged with the Chronic Care Management team. I reached out to [place] by phone today to assist with re-scheduling an initial visit with the Pharmacist  Follow up plan: Patient declines further follow up and engagement by the care management team. Appropriate care team members and provider have been notified via electronic communication.   [name], [place], [place] [address] Direct Dial: [phone] [email or web address]

## 2022-08-13 ENCOUNTER — Other Ambulatory Visit: Payer: Self-pay | Admitting: *Deleted

## 2022-08-13 ENCOUNTER — Other Ambulatory Visit: Payer: Self-pay | Admitting: Family Medicine

## 2022-08-13 ENCOUNTER — Telehealth: Payer: Self-pay | Admitting: Family Medicine

## 2022-08-13 DIAGNOSIS — M47816 Spondylosis without myelopathy or radiculopathy, lumbar region: Secondary | ICD-10-CM

## 2022-08-13 MED ORDER — CELECOXIB 400 MG PO CAPS: 400.0000 mg | ORAL_CAPSULE | Freq: Every day | ORAL | 3 refills | Status: DC

## 2022-08-13 MED ORDER — CLOPIDOGREL BISULFATE 75 MG PO TABS: 75.0000 mg | ORAL_TABLET | Freq: Every day | ORAL | 3 refills | Status: DC

## 2022-08-13 NOTE — Telephone Encounter (Signed)
Okay to refill. Also I am sending in plavix to replace the brilinta

## 2022-08-13 NOTE — Telephone Encounter (Signed)
Celebrax not on pt's med list, talked w/ him told him this was to be stopped back in November at hospital discharge. He said this was for his arthritis and he was just to take it as needed. His hospitalization was for an acute CVA Please advise if pt is to remain off of the Celebrax or if this can be refilled.

## 2022-08-13 NOTE — Telephone Encounter (Signed)
Called patient, no answer, left message to return call 

## 2022-08-13 NOTE — Telephone Encounter (Signed)
I sent in plavix to [facility] mail order to replace the [place]

## 2022-08-13 NOTE — Telephone Encounter (Signed)
CALLED PATIENT, NO ANSWER, LEFT MESSAGE TO RETURN CALL

## 2022-08-13 NOTE — Telephone Encounter (Signed)
  Prescription Request  08/13/2022  Is this a "Controlled Substance" medicine? no  Have you seen your PCP in the last 2 weeks? no  If YES, route message to pool  -  If NO, patient needs to be scheduled for appointment.  What is the name of the medication or equipment? Celecoxib 400 mg Cap  Have you contacted your pharmacy to request a refill? No   Which pharmacy would you like this sent to? [facility]   Patient notified that their request is being sent to the clinical staff for review and that they should receive a response within 2 business days.

## 2022-08-14 NOTE — Telephone Encounter (Signed)
Patient aware.

## 2022-08-27 ENCOUNTER — Other Ambulatory Visit: Payer: Medicare HMO

## 2022-08-27 DIAGNOSIS — C61 Malignant neoplasm of prostate: Secondary | ICD-10-CM

## 2022-08-28 LAB — PSA: Prostate Specific Ag, Serum: <0.1 ng/mL (ref 0.0–4.0)

## 2022-08-28 LAB — TESTOSTERONE: Testosterone: 3 ng/dL — ABNORMAL LOW (ref 264–916)

## 2022-09-05 ENCOUNTER — Ambulatory Visit: Payer: Self-pay | Admitting: *Deleted

## 2022-09-05 NOTE — Patient Instructions (Signed)
Visit Information  Thank you for taking time to visit with me today. Please don't hesitate to contact me if I can be of assistance to you.   Following are the goals we discussed today:   Goals Addressed               This Visit's Progress     Patient Stated     COMPLETED: manage transportation to medical appointments [facility]) (pt-stated)   On track     Care Coordination Interventions: Evaluation of current treatment plan related to transportation and upcoming medical appointments for imaging, urology and pcp and patient's adherence to plan as established by provider Provided education to patient re: [facility] medicare transportation benefits Discussed plans with patient for ongoing care management follow up and provided patient with direct contact information for care management team Assessed social determinant of health barriers Confirmed transportation needs met - Completed goal      COMPLETED: medication management ([facility]) (pt-stated)   On track     Care Coordination Interventions: Evaluation of current treatment plan related to delivery medication management ([facility] well)  and patient's adherence to plan as established by provider Discussed plans with patient for ongoing care management follow up and provided patient with direct contact information for care management team Confirmed he is now has Brilinta Goal met      nasal congestion management ([facility]) (pt-stated)   On track     Care Coordination Interventions: Patient interviewed about adult health maintenance status including  covid precautions, nasal congestion management Provided education about diabetic tussin & coricidin to prevent from causing elevations of his blood sugar and blood pressure.  Assessed his symptoms (nasal congestion, runny nose)  Confirms he continues to have some episodes of a runny nose and believes it is related to allergies Confirms he is using over the counter medicines for allergies        Our next  appointment is by telephone on 11/05/22 at 1115  Please call the care guide team at [phone] if you need to cancel or reschedule your appointment.   If you are experiencing a Mental Health or [place] or need someone to talk to, please call the Suicide and Crisis Lifeline: 988 call the [place] National Suicide Prevention Lifeline: 6061058549 or TTY: 551-079-5157 TTY 919 337 2042) to talk to a trained counselor call 1-800-273-TALK (toll free, 24 hour hotline) call the [facility]: (941) 622-6997 call 911   The patient verbalized understanding of instructions, educational materials, and care plan provided today and DECLINED offer to receive copy of patient instructions, educational materials, and care plan.   The patient has been provided with contact information for the care management team and has been advised to call with any health related questions or concerns.   Jimmi Sidener L. Lavina Hamman, RN, BSN, [place] Coordinator Office number [phone]

## 2022-09-05 NOTE — Patient Outreach (Signed)
  Care Coordination   Follow Up Visit Note   09/05/2022 Name: Patrick Moss MRN: [id number] DOB: 08-09-1940  Patrick Moss is a 82 y.o. year old male who sees [name], MD for primary care. I spoke with  Patrick Moss by phone today.  What matters to the patients health and wellness today?  Interested in a continuous glucometer to help with the multiple finger stick and pain of his fingers, Still has a runny nose Now walking without a cane - I'm Got his Brilinta - 3 month supply  "I feel good"   To see urology 09/09/22 1030 Diabetes- Checks 2-3 times a day Inquire about a continuous glucometer - no smart mobile phone- poor phone reception hone    Goals Addressed               This Visit's Progress     Patient Stated     COMPLETED: manage transportation to medical appointments [facility]) (pt-stated)   On track     Care Coordination Interventions: Evaluation of current treatment plan related to transportation and upcoming medical appointments for imaging, urology and pcp and patient's adherence to plan as established by provider Provided education to patient re: [place] transportation benefits Discussed plans with patient for ongoing care management follow up and provided patient with direct contact information for care management team Assessed social determinant of health barriers Confirmed transportation needs met - Completed goal      COMPLETED: medication management ([facility]) (pt-stated)   On track     Care Coordination Interventions: Evaluation of current treatment plan related to delivery medication management ([facility])  and patient's adherence to plan as established by provider Discussed plans with patient for ongoing care management follow up and provided patient with direct contact information for care management team Confirmed he is now has Brilinta Goal met      nasal congestion management ([facility]) (pt-stated)   On track     Care Coordination  Interventions: Patient interviewed about adult health maintenance status including  covid precautions, nasal congestion management Provided education about diabetic tussin & coricidin to prevent from causing elevations of his blood sugar and blood pressure.  Assessed his symptoms (nasal congestion, runny nose)  Confirms he continues to have some episodes of a runny nose and believes it is related to allergies Confirms he is using over the counter medicines for allergies        SDOH assessments and interventions completed:  No     Care Coordination Interventions:  Yes, provided   Follow up plan: Follow up call scheduled for 11/05/22    Encounter Outcome:  Pt. Visit Completed   Dillon Livermore L. Lavina Hamman, RN, BSN, [place] Coordinator Office number [phone]

## 2022-09-08 ENCOUNTER — Other Ambulatory Visit: Payer: Self-pay | Admitting: Family Medicine

## 2022-09-08 MED ORDER — LANCET DEVICE MISC: 1.0000 | Freq: Three times a day (TID) | 0 refills | Status: AC

## 2022-09-08 MED ORDER — LANCETS MISC. MISC: 1.0000 | Freq: Three times a day (TID) | 5 refills | Status: AC

## 2022-09-08 MED ORDER — BLOOD GLUCOSE TEST VI STRP: 1.0000 | ORAL_STRIP | Freq: Three times a day (TID) | 5 refills | Status: DC

## 2022-09-08 MED ORDER — BLOOD GLUCOSE MONITORING SUPPL DEVI: 1.0000 | Freq: Three times a day (TID) | 0 refills | Status: DC

## 2022-09-08 NOTE — Progress Notes (Signed)
History of Present Illness:   6.13.2023: He underwent ultrasound and biopsy of the prostate.  PSA 13.2, prostate volume 105 mL, PSA density 0.13.  Biopsy results as follows:    9/12 cores positive-- 1 core  revealed GG 1 pattern 1 core revealed GG 2 pattern 7 cores revealed GG 3 pattern  Perineural invasion was found in 4 of the cores.  PET CT scan was performed on 6.22.2023.  This revealed no evidence of extra prostatic disease.   8.22.2023: ST ADT commenced-the patient received Firmagon 240 mg.   9.5.2023: Underwent placement of fiducial markers and SpaceOAR.  9.26.2023: Received a 54-month leuprolide injection.   10.2.2023 - 04/25/2022-received 28 fractions of EBRT  11.22.2023: Admitted for management of a CVA  3.26.2024: Here for routine check.  Does have somewhat of a slow stream but it is tolerable.  No gross hematuria, no dysuria.  No blood per rectum.  PSA less than 0.1.  He has not received leuprolide since September, I think we are done with these injections.  Past Medical History:  Diagnosis Date   Borderline glaucoma of both eyes    BPH associated with nocturia    CKD (chronic kidney disease), stage II    Complication of anesthesia    per pt emergent hiccups   Hyperlipidemia, mixed    Hypertension    Malignant neoplasm prostate ([place]) 11/2021   primary urologist---  dr dahl[place]/  radiation oncology [facility];  dx 06/ 2023,  Gleason 4+3,  PSA  13.2   OA (osteoarthritis)    knees   Type 2 diabetes mellitus treated with insulin ([place])    followed by pcp ( previous seen by endocriologist--- dr g. nida ,lov note in epic 02-19-2021);    (02-11-2022  pt stated checks blood sugar twice daily,  fasting sugar average 90s  to 120s)   Wears dentures    upper   Wears glasses     Past Surgical History:  Procedure Laterality Date   FEMUR IM NAIL Right 05/03/2021   @[facility] --  W-S   FOOT FRACTURE SURGERY Right    2017;   ORIF , per pt plate under foot    GOLD SEED IMPLANT N/A 02/18/2022   Procedure: GOLD SEED IMPLANT;  Surgeon: Remi Haggard, MD;  Location: [facility];  Service: Urology;  Laterality: N/A;  [place] Bilateral 2007   approx   IR ANGIO INTRA EXTRACRAN SEL COM CAROTID INNOMINATE UNI R MOD SED  05/14/2022   IR ANGIO VERTEBRAL SEL VERTEBRAL UNI R MOD SED  05/14/2022   IR INTRAVSC STENT CERV CAROTID W/EMB-PROT MOD SED INCL ANGIO  05/14/2022   IR RADIOLOGIST EVAL & MGMT  06/03/2022   RADIOLOGY WITH ANESTHESIA N/A 05/14/2022   Procedure: R ICA stent;  Surgeon: Luanne Bras, MD;  Location: [place];  Service: Radiology;  Laterality: N/A;   SPACE OAR INSTILLATION N/A 02/18/2022   Procedure: SPACE OAR INSTILLATION;  Surgeon: Remi Haggard, MD;  Location: [facility];  Service: Urology;  Laterality: N/A;    Home Medications:  Allergies as of 09/09/2022       Reactions   Septra [sulfamethoxazole-trimethoprim] Hives        Medication List        Accurate as of September 08, 2022  8:18 PM. If you have any questions, ask your nurse or doctor.          aspirin 81 MG chewable tablet Chew  1 tablet (81 mg total) by mouth daily.   B-D ULTRAFINE III SHORT PEN 31G X 8 MM Misc Generic drug: Insulin Pen Needle USE  1  PEN  NEEDLE AS DIRECTED   Basaglar KwikPen 100 UNIT/ML Inject 20 Units into the skin daily.   Blood Glucose Monitoring Suppl Devi 1 each by Does not apply route in the morning, at noon, and at bedtime. May substitute to any manufacturer covered by patient's insurance.   calcium carbonate 1500 (600 Ca) MG Tabs tablet Commonly known as: OSCAL Take 1,500 mg by mouth 2 (two) times daily with a meal.   celecoxib 400 MG capsule Commonly known as: CELEBREX Take 1 capsule (400 mg total) by mouth daily. With food   clopidogrel 75 MG tablet Commonly known as: PLAVIX Take 1 tablet (75 mg total) by mouth daily.   diclofenac Sodium 1 % Gel Commonly known as:  Voltaren Apply 4 g topically 4 (four) times daily as needed. For the knee   Lancet Device Misc 1 each by Does not apply route in the morning, at noon, and at bedtime. May substitute to any manufacturer covered by patient's insurance.   Lancets Misc. Misc 1 each by Does not apply route in the morning, at noon, and at bedtime. May substitute to any manufacturer covered by patient's insurance.   metFORMIN 500 MG 24 hr tablet Commonly known as: GLUCOPHAGE-XR Take 2 tablets (1,000 mg total) by mouth daily with breakfast.   omega-3 fish oil 1000 MG Caps capsule Commonly known as: MAXEPA Take 1 capsule by mouth daily.   pantoprazole 40 MG tablet Commonly known as: Protonix Take 1 tablet (40 mg total) by mouth daily.   rosuvastatin 5 MG tablet Commonly known as: CRESTOR Take 1 tablet (5 mg total) by mouth daily.   True Metrix Blood Glucose Test test strip Generic drug: glucose blood TEST BLOOD SUGAR TWO TO THREE TIMES DAILY   BLOOD GLUCOSE TEST STRIPS Strp 1 each by In Vitro route in the morning, at noon, and at bedtime. May substitute to any manufacturer covered by patient's insurance.   Trulicity 1.5 0000000 Sopn Generic drug: Dulaglutide Inject 1.5 mg into the skin once a week. Saturday's   Vitamin D3 50 MCG (2000 UT) Tabs Generic drug: Cholecalciferol Take 1 capsule by mouth daily.        Allergies:  Allergies  Allergen Reactions   Septra [Sulfamethoxazole-Trimethoprim] Hives    Family History  Problem Relation Age of Onset   Diabetes Mother    Stroke Mother    Diabetes Father    Stroke Father    Diabetes Brother     Social History:  reports that he has never smoked. He has never used smokeless tobacco. He reports that he does not drink alcohol and does not use drugs.  ROS: A complete review of systems was performed.  All systems are negative except for pertinent findings as noted.  Physical Exam:  Vital signs in last 24 hours: There were no vitals taken  for this visit. Constitutional:  Alert and oriented, No acute distress Cardiovascular: Regular rate  Respiratory: Normal respiratory effort Neurologic: Grossly intact, no focal deficits Psychiatric: Normal mood and affect  I have reviewed prior pt notes  I have reviewed urinalysis results  I have independently reviewed prior imaging  I have reviewed prior PSA and pathology results    Impression/Assessment:  Grade group 3 prostate cancer, status post EBRT/ST ADT with excellent PSA response thus far  BPH with moderate symptomatology,  patient would prefer no medical therapy  Plan:  I will see him back in 6 months with PSA, at the same time as his twin brother, Abe People.

## 2022-09-09 ENCOUNTER — Other Ambulatory Visit: Payer: Self-pay

## 2022-09-09 ENCOUNTER — Ambulatory Visit: Payer: Medicare HMO | Admitting: Urology

## 2022-09-09 ENCOUNTER — Encounter: Payer: Self-pay | Admitting: Urology

## 2022-09-09 VITALS — BP 162/79 | HR 65 | Ht 66.0 in | Wt 161.2 lb

## 2022-09-09 DIAGNOSIS — C61 Malignant neoplasm of prostate: Secondary | ICD-10-CM | POA: Diagnosis not present

## 2022-09-09 DIAGNOSIS — N401 Enlarged prostate with lower urinary tract symptoms: Secondary | ICD-10-CM | POA: Diagnosis not present

## 2022-09-09 LAB — MICROSCOPIC EXAMINATION: Bacteria, UA: NONE SEEN

## 2022-09-09 LAB — URINALYSIS, ROUTINE W REFLEX MICROSCOPIC
Bilirubin, UA: NEGATIVE
Glucose, UA: NEGATIVE
Leukocytes,UA: NEGATIVE
Nitrite, UA: NEGATIVE
RBC, UA: NEGATIVE
Specific Gravity, UA: 1.03 (ref 1.005–1.030)
Urobilinogen, Ur: 1 mg/dL (ref 0.2–1.0)
pH, UA: 5 (ref 5.0–7.5)

## 2022-10-07 ENCOUNTER — Telehealth: Payer: Self-pay | Admitting: Family Medicine

## 2022-10-07 ENCOUNTER — Ambulatory Visit (INDEPENDENT_AMBULATORY_CARE_PROVIDER_SITE_OTHER): Payer: Medicare HMO

## 2022-10-07 VITALS — Ht 66.0 in | Wt 162.0 lb

## 2022-10-07 DIAGNOSIS — Z0000 Encounter for general adult medical examination without abnormal findings: Secondary | ICD-10-CM | POA: Diagnosis not present

## 2022-10-07 DIAGNOSIS — E119 Type 2 diabetes mellitus without complications: Secondary | ICD-10-CM

## 2022-10-07 NOTE — Patient Instructions (Signed)
Patrick Moss , Thank you for taking time to come for your Medicare Wellness Visit. I appreciate your ongoing commitment to your health goals. Please review the following plan we discussed and let me know if I can assist you in the future.   These are the goals we discussed:  Goals       Exercise 3x per week (30 min per time)      10/03/2021 AWV Goal: Exercise for General Health  Patient will verbalize understanding of the benefits of increased physical activity: Exercising regularly is important. It will improve your overall fitness, flexibility, and endurance. Regular exercise also will improve your overall health. It can help you control your weight, reduce stress, and improve your bone density. Over the next year, patient will increase physical activity as tolerated with a goal of at least 150 minutes of moderate physical activity per week.  You can tell that you are exercising at a moderate intensity if your heart starts beating faster and you start breathing faster but can still hold a conversation. Moderate-intensity exercise ideas include: Walking 1 mile (1.6 km) in about 15 minutes Biking Hiking Golfing Dancing Water aerobics Patient will verbalize understanding of everyday activities that increase physical activity by providing examples like the following: Yard work, such as: Insurance underwriter Gardening Washing windows or floors Patient will be able to explain general safety guidelines for exercising:  Before you start a new exercise program, talk with your health care provider. Do not exercise so much that you hurt yourself, feel dizzy, or get very short of breath. Wear comfortable clothes and wear shoes with good support. Drink plenty of water while you exercise to prevent dehydration or heat stroke. Work out until your breathing and your heartbeat get faster.   Goals Addressed              This Visit's Progress    Exercise 3x per week (30 min per time)   On track    09/30/2019 AWV Goal: Exercise for General Health  Patient will verbalize understanding of the benefits of increased physical activity: Exercising regularly is important. It will improve your overall fitness, flexibility, and endurance. Regular exercise also will improve your overall health. It can help you control your weight, reduce stress, and improve your bone density. Over the next year, patient will increase physical activity as tolerated with a goal of at least 150 minutes of moderate physical activity per week.  You can tell that you are exercising at a moderate intensity if your heart starts beating faster and you start breathing faster but can still hold a conversation. Moderate-intensity exercise ideas include: Walking 1 mile (1.6 km) in about 15 minutes Biking Hiking Golfing Dancing Water aerobics Patient will verbalize understanding of everyday activities that increase physical activity by providing examples like the following: Yard work, such as: Insurance underwriter Gardening Washing windows or floors Patient will be able to explain general safety guidelines for exercising:  Before you start a new exercise program, talk with your health care provider. Do not exercise so much that you hurt yourself, feel dizzy, or get very short of breath. Wear comfortable clothes and wear shoes with good support. Drink plenty of water while you exercise to prevent dehydration or heat stroke. Work out until your breathing and your heartbeat get faster.  nasal congestion management ([facility]N) (pt-stated)      Care Coordination Interventions: Patient interviewed about adult health maintenance status including  covid precautions, nasal congestion management Provided education about diabetic tussin & coricidin to  prevent from causing elevations of his blood sugar and blood pressure.  Assessed his symptoms (nasal congestion, runny nose)  Confirms he continues to have some episodes of a runny nose and believes it is related to allergies Confirms he is using over the counter medicines for allergies      Patient Stated      10/03/2021 - Over the next year, he would like to have bathroom remodeled to be handicap accessible, wants to maintain independence      T2DM-PHARMD (pt-stated)      Current Barriers:  Unable to independently afford treatment regimen Unable to achieve control of T2DM  Suboptimal therapeutic regimen for T2DM  Pharmacist Clinical Goal(s):  Over the next 90 days, patient will verbalize ability to afford treatment regimen achieve control of T2DM as evidenced by GOAL A1C<7% through collaboration with PharmD and provider.    Interventions: 1:1 collaboration with [name], MD regarding development and update of comprehensive plan of care as evidenced by provider attestation and co-signature Inter-disciplinary care team collaboration (see longitudinal plan of care) Comprehensive medication review performed; medication list updated in electronic medical record  Diabetes: Controlled-A1C 6.6%, GFR 62; current treatment: BASAGLAR 30 units DAILY;  METFORMIN; TRULICITY 1.5MG  SQ WEEKLY Patient enrolled IN [employer] PATIENT ASSISTANCE PROGRAM until 06/15/22 for TRULICITY --MEDICATION TO SHIP TO PCP OFFICE DUE TO PHONE NOT ALWAYS WORKING CONTINUE TRULICITY 1.5mg  weekly Denies personal and family history of Medullary thyroid cancer (MTC) TOLERATING WELL CONTINUE BASAGLAR 30 UNITS DAILY Shipment arrived today CONSIDER ADDING SGLT2 AT F/U Current glucose readings: fasting glucose: <135, post prandial glucose: N/A Denies hypoglycemic/hyperglycemic symptoms Discussed meal planning options and Plate method for healthy eating Avoid sugary drinks and desserts Incorporate balanced protein,  non starchy veggies, 1 serving of carbohydrate with each meal Increase water intake Increase physical activity as able Current exercise: N/A Educated on TRULICITY--MEDICATION PURPOSE & SIDE EFFECTS Recommended patient stated blood sugar was starting to increase, but FBG was 109--will continue current management; patient at goal Assessed patient finances. Patient enrolled in [employer] patient assistance program for Trulicity & BASAGLAR FOR 2023  HYPERLIPIDEMIA Discussed statin and compliance--patient at goal   Lipid Panel     Component Value Date/Time   CHOL 113 08/27/2021 1321   TRIG 116 08/27/2021 1321   HDL 40 08/27/2021 1321   CHOLHDL 2.8 08/27/2021 1321   LDLCALC 52 08/27/2021 1321   LABVLDL 21 08/27/2021 1321  Patient Goals/Self-Care Activities Over the next 90 days, patient will:  - take medications as prescribed check glucose DAILY (FASTING), document, and provide at future appointments  Follow Up Plan: Telephone follow up appointment with care management team member scheduled for: 3 MONTHS        This is a list of the screening recommended for you and due dates:  Health Maintenance  Topic Date Due   COVID-19 Vaccine (4 - 2023-24 season) 02/14/2022   Flu Shot  01/15/2023   Hemoglobin A1C  02/02/2023   Eye exam for diabetics  03/14/2023   Yearly kidney function blood test for diabetes  08/05/2023   Yearly kidney health urinalysis for diabetes  08/05/2023   Complete foot exam   08/05/2023   Medicare Annual Wellness Visit  10/07/2023   DTaP/Tdap/Td vaccine (2 - Td or Tdap) 12/26/2024  Pneumonia Vaccine  Completed   Zoster (Shingles) Vaccine  Completed   HPV Vaccine  Aged Out    Advanced directives: Please bring a copy of your health care power of attorney and living will to the office to be added to your chart at your convenience.   Conditions/risks identified: Aim for 30 minutes of exercise or brisk walking, 6-8 glasses of water, and 5 servings of fruits and  vegetables each day.   Next appointment: Follow up in one year for your annual wellness visit.   Preventive Care 77 Years and Older, Male  Preventive care refers to lifestyle choices and visits with your health care provider that can promote health and wellness. What does preventive care include? A yearly physical exam. This is also called an annual well check. Dental exams once or twice a year. Routine eye exams. Ask your health care provider how often you should have your eyes checked. Personal lifestyle choices, including: Daily care of your teeth and gums. Regular physical activity. Eating a healthy diet. Avoiding tobacco and drug use. Limiting alcohol use. Practicing safe sex. Taking low doses of aspirin every day. Taking vitamin and mineral supplements as recommended by your health care provider. What happens during an annual well check? The services and screenings done by your health care provider during your annual well check will depend on your age, overall health, lifestyle risk factors, and family history of disease. Counseling  Your health care provider may ask you questions about your: Alcohol use. Tobacco use. Drug use. Emotional well-being. Home and relationship well-being. Sexual activity. Eating habits. History of falls. Memory and ability to understand (cognition). Work and work Astronomer. Screening  You may have the following tests or measurements: Height, weight, and BMI. Blood pressure. Lipid and cholesterol levels. These may be checked every 5 years, or more frequently if you are over 32 years old. Skin check. Lung cancer screening. You may have this screening every year starting at age 43 if you have a 30-pack-year history of smoking and currently smoke or have quit within the past 15 years. Fecal occult blood test (FOBT) of the stool. You may have this test every year starting at age 71. Flexible sigmoidoscopy or colonoscopy. You may have a  sigmoidoscopy every 5 years or a colonoscopy every 10 years starting at age 70. Prostate cancer screening. Recommendations will vary depending on your family history and other risks. Hepatitis C blood test. Hepatitis B blood test. Sexually transmitted disease (STD) testing. Diabetes screening. This is done by checking your blood sugar (glucose) after you have not eaten for a while (fasting). You may have this done every 1-3 years. Abdominal aortic aneurysm (AAA) screening. You may need this if you are a current or former smoker. Osteoporosis. You may be screened starting at age 43 if you are at high risk. Talk with your health care provider about your test results, treatment options, and if necessary, the need for more tests. Vaccines  Your health care provider may recommend certain vaccines, such as: Influenza vaccine. This is recommended every year. Tetanus, diphtheria, and acellular pertussis (Tdap, Td) vaccine. You may need a Td booster every 10 years. Zoster vaccine. You may need this after age 16. Pneumococcal 13-valent conjugate (PCV13) vaccine. One dose is recommended after age 12. Pneumococcal polysaccharide (PPSV23) vaccine. One dose is recommended after age 37. Talk to your health care provider about which screenings and vaccines you need and how often you need them. This information is not intended to replace  advice given to you by your health care provider. Make sure you discuss any questions you have with your health care provider. Document Released: 06/29/2015 Document Revised: 02/20/2016 Document Reviewed: 04/03/2015 Elsevier Interactive Patient Education  2017 [employer].  Fall Prevention in the Home Falls can cause injuries. They can happen to people of all ages. There are many things you can do to make your home safe and to help prevent falls. What can I do on the outside of my home? Regularly fix the edges of walkways and driveways and fix any cracks. Remove anything  that might make you trip as you walk through a door, such as a raised step or threshold. Trim any bushes or trees on the path to your home. Use bright outdoor lighting. Clear any walking paths of anything that might make someone trip, such as rocks or tools. Regularly check to see if handrails are loose or broken. Make sure that both sides of any steps have handrails. Any raised decks and porches should have guardrails on the edges. Have any leaves, snow, or ice cleared regularly. Use sand or salt on walking paths during winter. Clean up any spills in your garage right away. This includes oil or grease spills. What can I do in the bathroom? Use night lights. Install grab bars by the toilet and in the tub and shower. Do not use towel bars as grab bars. Use non-skid mats or decals in the tub or shower. If you need to sit down in the shower, use a plastic, non-slip stool. Keep the floor dry. Clean up any water that spills on the floor as soon as it happens. Remove soap buildup in the tub or shower regularly. Attach bath mats securely with double-sided non-slip rug tape. Do not have throw rugs and other things on the floor that can make you trip. What can I do in the bedroom? Use night lights. Make sure that you have a light by your bed that is easy to reach. Do not use any sheets or blankets that are too big for your bed. They should not hang down onto the floor. Have a firm chair that has side arms. You can use this for support while you get dressed. Do not have throw rugs and other things on the floor that can make you trip. What can I do in the kitchen? Clean up any spills right away. Avoid walking on wet floors. Keep items that you use a lot in easy-to-reach places. If you need to reach something above you, use a strong step stool that has a grab bar. Keep electrical cords out of the way. Do not use floor polish or wax that makes floors slippery. If you must use wax, use non-skid floor  wax. Do not have throw rugs and other things on the floor that can make you trip. What can I do with my stairs? Do not leave any items on the stairs. Make sure that there are handrails on both sides of the stairs and use them. Fix handrails that are broken or loose. Make sure that handrails are as long as the stairways. Check any carpeting to make sure that it is firmly attached to the stairs. Fix any carpet that is loose or worn. Avoid having throw rugs at the top or bottom of the stairs. If you do have throw rugs, attach them to the floor with carpet tape. Make sure that you have a light switch at the top of the stairs and the bottom  of the stairs. If you do not have them, ask someone to add them for you. What else can I do to help prevent falls? Wear shoes that: Do not have high heels. Have rubber bottoms. Are comfortable and fit you well. Are closed at the toe. Do not wear sandals. If you use a stepladder: Make sure that it is fully opened. Do not climb a closed stepladder. Make sure that both sides of the stepladder are locked into place. Ask someone to hold it for you, if possible. Clearly mark and make sure that you can see: Any grab bars or handrails. First and last steps. Where the edge of each step is. Use tools that help you move around (mobility aids) if they are needed. These include: Canes. Walkers. Scooters. Crutches. Turn on the lights when you go into a dark area. Replace any light bulbs as soon as they burn out. Set up your furniture so you have a clear path. Avoid moving your furniture around. If any of your floors are uneven, fix them. If there are any pets around you, be aware of where they are. Review your medicines with your doctor. Some medicines can make you feel dizzy. This can increase your chance of falling. Ask your doctor what other things that you can do to help prevent falls. This information is not intended to replace advice given to you by your  health care provider. Make sure you discuss any questions you have with your health care provider. Document Released: 03/29/2009 Document Revised: 11/08/2015 Document Reviewed: 07/07/2014 Elsevier Interactive Patient Education  2017 [employer].

## 2022-10-07 NOTE — Telephone Encounter (Signed)
Pt called to let Raynelle Fanning know ahead of time that he has about a month left of his Trulicity. Wants Raynelle Fanning to call him. Can leave detailed message if pt can't answer.

## 2022-10-07 NOTE — Progress Notes (Signed)
Subjective:   Patrick Moss is a 82 y.o. male who presents for Medicare Annual/Subsequent preventive examination. I connected with  Patrick Moss on 10/07/22 by a audio enabled telemedicine application and verified that I am speaking with the correct person using two identifiers.  Patient Location: Home  Provider Location: Home Office  I discussed the limitations of evaluation and management by telemedicine. The patient expressed understanding and agreed to proceed.  Review of Systems     Cardiac Risk Factors include: advanced age (>49men, >34 women);male gender;diabetes mellitus;dyslipidemia;hypertension     Objective:    Today's Vitals   10/07/22 0819  Weight: 162 lb (73.5 kg)  Height: 5\' 6"  (1.676 m)   Body mass index is 26.15 kg/m.     10/07/2022    8:24 AM 05/21/2022    1:11 PM 05/14/2022   11:31 AM 05/10/2022    4:04 AM 05/07/2022   12:30 PM 02/18/2022    7:27 AM 10/03/2021    8:26 AM  Advanced Directives  Does Patient Have a Medical Advance Directive? Yes No No No No Yes Yes  Type of Estate agent of [place];Living will     Living will Healthcare Power of [place];Living will  Does patient want to make changes to medical advance directive?      No - Patient declined   Copy of Healthcare Power of Attorney in Chart? No - copy requested      No - copy requested  Would patient like information on creating a medical advance directive?  No - Patient declined   No - Patient declined      Current Medications (verified) Outpatient Encounter Medications as of 10/07/2022  Medication Sig   aspirin 81 MG chewable tablet Chew 1 tablet (81 mg total) by mouth daily.   B-D ULTRAFINE III SHORT PEN 31G X 8 MM MISC USE  1  PEN  NEEDLE AS DIRECTED   Blood Glucose Monitoring Suppl DEVI 1 each by Does not apply route in the morning, at noon, and at bedtime. May substitute to any manufacturer covered by patient's insurance.   calcium carbonate (OSCAL) 1500 (600  Ca) MG TABS tablet Take 1,500 mg by mouth 2 (two) times daily with a meal.   celecoxib (CELEBREX) 400 MG capsule Take 1 capsule (400 mg total) by mouth daily. With food   Cholecalciferol (VITAMIN D3) 50 MCG (2000 UT) TABS Take 1 capsule by mouth daily.   clopidogrel (PLAVIX) 75 MG tablet Take 1 tablet (75 mg total) by mouth daily.   diclofenac Sodium (VOLTAREN) 1 % GEL Apply 4 g topically 4 (four) times daily as needed. For the knee   Dulaglutide (TRULICITY) 1.5 MG/0.5ML SOPN Inject 1.5 mg into the skin once a week. Saturday's   Glucose Blood (BLOOD GLUCOSE TEST STRIPS) STRP 1 each by In Vitro route in the morning, at noon, and at bedtime. May substitute to any manufacturer covered by patient's insurance.   glucose blood (TRUE METRIX BLOOD GLUCOSE TEST) test strip TEST BLOOD SUGAR TWO TO THREE TIMES DAILY   Insulin Glargine (BASAGLAR KWIKPEN) 100 UNIT/ML Inject 20 Units into the skin daily.   Lancet Device MISC 1 each by Does not apply route in the morning, at noon, and at bedtime. May substitute to any manufacturer covered by patient's insurance.   Lancets Misc. MISC 1 each by Does not apply route in the morning, at noon, and at bedtime. May substitute to any manufacturer covered by patient's insurance.   metFORMIN (  GLUCOPHAGE-XR) 500 MG 24 hr tablet Take 2 tablets (1,000 mg total) by mouth daily with breakfast.   omega-3 fish oil (MAXEPA) 1000 MG CAPS capsule Take 1 capsule by mouth daily.   pantoprazole (PROTONIX) 40 MG tablet Take 1 tablet (40 mg total) by mouth daily.   rosuvastatin (CRESTOR) 5 MG tablet Take 1 tablet (5 mg total) by mouth daily.   Facility-Administered Encounter Medications as of 10/07/2022  Medication   sodium phosphate (FLEET) 7-19 GM/118ML enema 1 enema    Allergies (verified) Septra [sulfamethoxazole-trimethoprim]   History: Past Medical History:  Diagnosis Date   Borderline glaucoma of both eyes    BPH associated with nocturia    CKD (chronic kidney disease),  stage II    Complication of anesthesia    per pt emergent hiccups   Hyperlipidemia, mixed    Hypertension    Malignant neoplasm prostate 11/2021   primary urologist---  dr dahlstadt/  radiation oncology [facility];  dx 06/ 2023,  Gleason 4+3,  PSA  13.2   OA (osteoarthritis)    knees   Type 2 diabetes mellitus treated with insulin    followed by pcp ( previous seen by endocriologist--- dr g. nida ,lov note in epic 02-19-2021);    (02-11-2022  pt stated checks blood sugar twice daily,  fasting sugar average 90s  to 120s)   Wears dentures    upper   Wears glasses    Past Surgical History:  Procedure Laterality Date   FEMUR IM NAIL Right 05/03/2021   --  W-S   FOOT FRACTURE SURGERY Right    2017;   ORIF , per pt plate under foot   GOLD SEED IMPLANT N/A 02/18/2022   Procedure: GOLD SEED IMPLANT;  Surgeon: Belva Agee, MD;  [facility];  Service: Urology;  Laterality: N/A;  30 MINS   INGUINAL HERNIA REPAIR Bilateral 2007   approx   IR ANGIO INTRA EXTRACRAN SEL COM CAROTID INNOMINATE UNI R MOD SED  05/14/2022   IR ANGIO VERTEBRAL SEL VERTEBRAL UNI R MOD SED  05/14/2022   IR INTRAVSC STENT CERV CAROTID W/EMB-PROT MOD SED INCL ANGIO  05/14/2022   IR RADIOLOGIST EVAL & MGMT  06/03/2022   RADIOLOGY WITH ANESTHESIA N/A 05/14/2022   Procedure: R ICA stent;  Surgeon: Julieanne Cotton, MD;  Location: [facility] OR;  Service: Radiology;  Laterality: N/A;   SPACE OAR INSTILLATION N/A 02/18/2022   Procedure: SPACE OAR INSTILLATION;  Surgeon: Belva Agee, MD;  Location: [facility];  Service: Urology;  Laterality: N/A;   Family History  Problem Relation Age of Onset   Diabetes Mother    Stroke Mother    Diabetes Father    Stroke Father    Diabetes Brother    Social History   Socioeconomic History   Marital status: Divorced    Spouse name: Not on file   Number of children: 2   Years of education: 10   Highest education  level: 10th grade  Occupational History   Occupation: retired  Tobacco Use   Smoking status: Never   Smokeless tobacco: Never  Building services engineer Use: Never used  Substance and Sexual Activity   Alcohol use: No    Alcohol/week: 0.0 standard drinks of alcohol   Drug use: Never   Sexual activity: Not on file  Other Topics Concern   Not on file  Social History Narrative   Lives alone - son lives next door  Lady friend on his street - they check on each other several times per day and spend time together   Social Determinants of Health   Financial Resource Strain: Low Risk  (10/07/2022)   Overall Financial Resource Strain (CARDIA)    Difficulty of Paying Living Expenses: Not hard at all  Food Insecurity: No Food Insecurity (10/07/2022)   Hunger Vital Sign    Worried About Running Out of Food in the Last Year: Never true    Ran Out of Food in the Last Year: Never true  Transportation Needs: No Transportation Needs (10/07/2022)   PRAPARE - Administrator, Civil Service (Medical): No    Lack of Transportation (Non-Medical): No  Physical Activity: Insufficiently Active (10/07/2022)   Exercise Vital Sign    Days of Exercise per Week: 3 days    Minutes of Exercise per Session: 30 min  Stress: No Stress Concern Present (10/07/2022)   [employer] of Occupational Health - Occupational Stress Questionnaire    Feeling of Stress : Not at all  Social Connections: Moderately Isolated (10/07/2022)   Social Connection and Isolation Panel [NHANES]    Frequency of Communication with Friends and Family: More than three times a week    Frequency of Social Gatherings with Friends and Family: More than three times a week    Attends Religious Services: More than 4 times per year    Active Member of [employer] or Organizations: No    Attends Engineer, structural: Never    Marital Status: Divorced    Tobacco Counseling Counseling given: Not Answered   Clinical  Intake:  Pre-visit preparation completed: Yes  Pain : No/denies pain     Nutritional Risks: None Diabetes: No  How often do you need to have someone help you when you read instructions, pamphlets, or other written materials from your doctor or pharmacy?: 1 - Never  Diabetic?yes  Nutrition Risk Assessment:  Has the patient had any N/V/D within the last 2 months?  No  Does the patient have any non-healing wounds?  No  Has the patient had any unintentional weight loss or weight gain?  No   Diabetes:  Is the patient diabetic?  Yes  If diabetic, was a CBG obtained today?  No  Did the patient bring in their glucometer from home?  No  How often do you monitor your CBG's? 2 x day .   Financial Strains and Diabetes Management:  Are you having any financial strains with the device, your supplies or your medication? No .  Does the patient want to be seen by Chronic Care Management for management of their diabetes?  No  Would the patient like to be referred to a Nutritionist or for Diabetic Management?  No   Diabetic Exams:  Diabetic Eye Exam: Completed 05/2022 Diabetic Foot Exam: Overdue, Pt has been advised about the importance in completing this exam. Pt is scheduled for diabetic foot exam on next office visit .   Interpreter Needed?: No  Information entered by :: Renie Ora, LPN   Activities of Daily Living    10/07/2022    8:24 AM 05/11/2022   12:00 AM  In your present state of health, do you have any difficulty performing the following activities:  Hearing? 0 0  Vision? 0 0  Difficulty concentrating or making decisions? 0 0  Walking or climbing stairs? 0 1  Dressing or bathing? 0 0  Doing errands, shopping? 0 0  Preparing Food and eating ?  N   Using the Toilet? N   In the past six months, have you accidently leaked urine? N   Do you have problems with loss of bowel control? N   Managing your Medications? N   Managing your Finances? N   Housekeeping or managing  your Housekeeping? N     Patient Care Team: [name], MD as PCP - General (Family Medicine) Roma Kayser, MD as Consulting Physician (Endocrinology) Dione Booze, Bertram Millard, MD as Consulting Physician (Ophthalmology) Danella Maiers, [facility] as Pharmacist (Family Medicine) Pilson, Tyrone Apple, MD (Orthopedic Surgery) Clinton Gallant, RN as [facility] Care Management  Indicate any recent Medical Services you may have received from other than [facility] providers in the past year (date may be approximate).     Assessment:   This is a routine wellness examination for [place].  Hearing/Vision screen Vision Screening - Comments:: Wears rx glasses - up to date with routine eye exams with  Dr.Groat   Dietary issues and exercise activities discussed: Current Exercise Habits: Home exercise routine, Type of exercise: walking, Time (Minutes): 30, Frequency (Times/Week): 5, Weekly Exercise (Minutes/Week): 150, Intensity: Mild, Exercise limited by: None identified   Goals Addressed             This Visit's Progress    Exercise 3x per week (30 min per time)   On track    10/03/2021 AWV Goal: Exercise for General Health  Patient will verbalize understanding of the benefits of increased physical activity: Exercising regularly is important. It will improve your overall fitness, flexibility, and endurance. Regular exercise also will improve your overall health. It can help you control your weight, reduce stress, and improve your bone density. Over the next year, patient will increase physical activity as tolerated with a goal of at least 150 minutes of moderate physical activity per week.  You can tell that you are exercising at a moderate intensity if your heart starts beating faster and you start breathing faster but can still hold a conversation. Moderate-intensity exercise ideas include: Walking 1 mile (1.6 km) in about 15  minutes Biking Hiking Golfing Dancing Water aerobics Patient will verbalize understanding of everyday activities that increase physical activity by providing examples like the following: Yard work, such as: Insurance underwriter Gardening Washing windows or floors Patient will be able to explain general safety guidelines for exercising:  Before you start a new exercise program, talk with your health care provider. Do not exercise so much that you hurt yourself, feel dizzy, or get very short of breath. Wear comfortable clothes and wear shoes with good support. Drink plenty of water while you exercise to prevent dehydration or heat stroke. Work out until your breathing and your heartbeat get faster.   Goals Addressed             This Visit's Progress    Exercise 3x per week (30 min per time)   On track    09/30/2019 AWV Goal: Exercise for General Health  Patient will verbalize understanding of the benefits of increased physical activity: Exercising regularly is important. It will improve your overall fitness, flexibility, and endurance. Regular exercise also will improve your overall health. It can help you control your weight, reduce stress, and improve your bone density. Over the next year, patient will increase physical activity as tolerated with a goal of at least 150 minutes of moderate physical activity per week.  You can tell that you are exercising at a moderate intensity if your heart starts beating faster and you start breathing faster but can still hold a conversation. Moderate-intensity exercise ideas include: Walking 1 mile (1.6 km) in about 15 minutes Biking Hiking Golfing Dancing Water aerobics Patient will verbalize understanding of everyday activities that increase physical activity by providing examples like the following: Yard work, such as: Garment/textile technologist Gardening Washing windows or floors Patient will be able to explain general safety guidelines for exercising:  Before you start a new exercise program, talk with your health care provider. Do not exercise so much that you hurt yourself, feel dizzy, or get very short of breath. Wear comfortable clothes and wear shoes with good support. Drink plenty of water while you exercise to prevent dehydration or heat stroke. Work out until your breathing and your heartbeat get faster.                Depression Screen    08/04/2022    9:40 AM 07/28/2022    8:52 PM 06/18/2022    4:09 PM 06/04/2022   10:31 AM 05/21/2022    1:16 PM 01/09/2022    9:02 AM 10/03/2021    8:22 AM  PHQ 2/9 Scores  PHQ - 2 Score 0 0 0 0 0 0 0  PHQ- 9 Score       0    Fall Risk    10/07/2022    8:20 AM 08/04/2022    9:40 AM 06/18/2022    4:09 PM 05/19/2022    8:20 AM 01/09/2022    9:02 AM  Fall Risk   Falls in the past year? 0 0 0 0 0  Number falls in past yr: 0      Injury with Fall? 0      Risk for fall due to : No Fall Risks      Follow up Falls prevention discussed        FALL RISK PREVENTION PERTAINING TO THE HOME:  Any stairs in or around the home? Yes  If so, are there any without handrails? No  Home free of loose throw rugs in walkways, pet beds, electrical cords, etc? Yes  Adequate lighting in your home to reduce risk of falls? Yes   ASSISTIVE DEVICES UTILIZED TO PREVENT FALLS:  Life alert? No  Use of a cane, walker or w/c? Yes  Grab bars in the bathroom? Yes  Shower chair or bench in shower? Yes  Elevated toilet seat or a handicapped toilet? Yes          10/07/2022    8:22 AM 10/03/2021    8:29 AM 10/02/2020    8:50 AM 09/30/2019    8:43 AM  6CIT Screen  What Year? 0 points 0 points 0 points 0 points  What month? 0 points 0 points 0 points 0 points  What time? 0 points 0 points 0 points 0 points  Count back from 20 0  points 0 points 0 points 0 points  Months in reverse 0 points 0 points 0 points 0 points  Repeat phrase 0 points 0 points 0 points 2 points  Total Score 0 points 0 points 0 points 2 points    Immunizations Immunization History  Administered Date(s) Administered   Fluad Quad(high Dose 65+) 04/07/2016, 04/30/2016, 03/20/2017, 03/17/2018, 03/29/2020   Influenza, High Dose Seasonal PF 03/28/2015, 04/07/2016, 04/30/2016, 03/20/2017, 03/17/2018, 03/07/2019  Influenza,inj,Quad PF,6+ Mos 04/07/2016, 04/30/2016, 03/20/2017, 03/17/2018   Moderna Sars-Covid-2 Vaccination 07/06/2019, 08/03/2019, 05/02/2020   Pneumococcal Conjugate-13 06/30/2016   Pneumococcal Polysaccharide-23 07/15/2017   Tdap 12/27/2014   Zoster Recombinat (Shingrix) 01/24/2021, 08/27/2021    TDAP status: Up to date  Flu Vaccine status: Up to date  Pneumococcal vaccine status: Up to date  Covid-19 vaccine status: Completed vaccines  Qualifies for Shingles Vaccine? Yes   Zostavax completed Yes   Shingrix Completed?: Yes  Screening Tests Health Maintenance  Topic Date Due   COVID-19 Vaccine (4 - 2023-24 season) 02/14/2022   INFLUENZA VACCINE  01/15/2023   HEMOGLOBIN A1C  02/02/2023   OPHTHALMOLOGY EXAM  03/14/2023   Diabetic kidney evaluation - eGFR measurement  08/05/2023   Diabetic kidney evaluation - Urine ACR  08/05/2023   FOOT EXAM  08/05/2023   Medicare Annual Wellness (AWV)  10/07/2023   DTaP/Tdap/Td (2 - Td or Tdap) 12/26/2024   Pneumonia Vaccine 42+ Years old  Completed   Zoster Vaccines- Shingrix  Completed   HPV VACCINES  Aged Out    Health Maintenance  Health Maintenance Due  Topic Date Due   COVID-19 Vaccine (4 - 2023-24 season) 02/14/2022    Colorectal cancer screening: No longer required.   Lung Cancer Screening: (Low Dose CT Chest recommended if Age 17-80 years, 30 pack-year currently smoking OR have quit w/in 15years.) does not qualify.   Lung Cancer Screening Referral:  n/a  Additional Screening:  Hepatitis C Screening: does not qualify;   Vision Screening: Recommended annual ophthalmology exams for early detection of glaucoma and other disorders of the eye. Is the patient up to date with their annual eye exam?  Yes  Who is the provider or what is the name of the office in which the patient attends annual eye exams? Dr.Groat  If pt is not established with a provider, would they like to be referred to a provider to establish care? No .   Dental Screening: Recommended annual dental exams for proper oral hygiene  Community Resource Referral / Chronic Care Management: CRR required this visit?  No   CCM required this visit?  No      Plan:     I have personally reviewed and noted the following in the patient's chart:   Medical and social history Use of alcohol, tobacco or illicit drugs  Current medications and supplements including opioid prescriptions. Patient is not currently taking opioid prescriptions. Functional ability and status Nutritional status Physical activity Advanced directives List of other physicians Hospitalizations, surgeries, and ER visits in previous 12 months Vitals Screenings to include cognitive, depression, and falls Referrals and appointments  In addition, I have reviewed and discussed with patient certain preventive protocols, quality metrics, and best practice recommendations. A written personalized care plan for preventive services as well as general preventive health recommendations were provided to patient.     Lorrene Reid, LPN   [id number]   Nurse Notes: none

## 2022-10-10 NOTE — Telephone Encounter (Signed)
Pt called stating that he called the [facility] regarding his [facility] Hospital doctor and was told that they are not taking anymore applications for these medicines. Needs Julies or Camilles advise.

## 2022-10-10 NOTE — Telephone Encounter (Signed)
Left message for pt to reach out to [facility] regarding refills [phone]). Also suggested he let them know to put medication on auto refill.

## 2022-10-13 ENCOUNTER — Telehealth: Payer: Self-pay

## 2022-10-13 NOTE — Telephone Encounter (Signed)
Chantel from [facility]. LVM stating she send over a medical clearance form last week. Stated pt has an appointment tomorrow morning.

## 2022-10-14 NOTE — Telephone Encounter (Signed)
Patrick Moss stated she was about to try and fax medical clearance again this morning because pt appointment is at 9 am this morning.

## 2022-10-14 NOTE — Telephone Encounter (Signed)
Received clearance request form for dental cleaning and x-rays. Ok to proceed per [facility] NP, no need to hold plavix/aspirin or give any pre-meds as form questions. Form completed (wrote reminder patient is on plavix and aspirin), signed by Aundra Millet NP and faxed back to [facility]. Received a receipt of confirmation.

## 2022-10-16 MED ORDER — BASAGLAR KWIKPEN 100 UNIT/ML ~~LOC~~ SOPN: 20.0000 [IU] | PEN_INJECTOR | Freq: Every day | SUBCUTANEOUS | 4 refills | Status: DC

## 2022-10-16 MED ORDER — TRULICITY 1.5 MG/0.5ML ~~LOC~~ SOAJ
1.5000 mg | SUBCUTANEOUS | 3 refills | Status: DC
Start: 2022-10-16 — End: 2023-06-04

## 2022-10-16 NOTE — Telephone Encounter (Signed)
Rxs for basaglar and trulicity escribed to [place]

## 2022-10-16 NOTE — Addendum Note (Signed)
Addended by: Vanice Sarah D on: 10/16/2022 09:06 AM   Modules accepted: Orders

## 2022-10-20 ENCOUNTER — Other Ambulatory Visit: Payer: Self-pay | Admitting: Family Medicine

## 2022-10-20 DIAGNOSIS — E119 Type 2 diabetes mellitus without complications: Secondary | ICD-10-CM

## 2022-10-20 NOTE — Telephone Encounter (Signed)
  Prescription Request  10/20/2022  Is this a "Controlled Substance" medicine? No  Have you seen your PCP in the last 2 weeks? yes  If YES, route message to pool  -  If NO, patient needs to be scheduled for appointment.  What is the name of the medication or equipment? Basaglar  Have you contacted your pharmacy to request a refill? NO   Which pharmacy would you like this sent to? [facility]   Patient notified that their request is being sent to the clinical staff for review and that they should receive a response within 2 business days.

## 2022-10-21 NOTE — Telephone Encounter (Signed)
Faxed renewal for [employer] to company.

## 2022-10-23 NOTE — Telephone Encounter (Signed)
Refills have already been escribed on 10/16/22 to [facility] for [facility] patient assistance Cancelled duplicate request His application is pending

## 2022-10-23 NOTE — Telephone Encounter (Signed)
Rec'd approval fax for basaglar.

## 2022-11-03 ENCOUNTER — Ambulatory Visit: Payer: Medicare HMO

## 2022-11-05 ENCOUNTER — Ambulatory Visit: Payer: Self-pay | Admitting: *Deleted

## 2022-11-05 NOTE — Patient Instructions (Signed)
Visit Information  Thank you for taking time to visit with me today. Please don't hesitate to contact me if I can be of assistance to you.   Following are the goals we discussed today:   Goals Addressed               This Visit's Progress     Patient Stated     COMPLETED: nasal congestion management ([facility]) (pt-stated)        Care Coordination Interventions: Completed goal  Nasal congestion, respiratory symptoms resolved      Other     [facility] care coordination services, Diabetes, dental services+        Interventions Today    Flowsheet Row Most Recent Value  Chronic Disease   Chronic disease during today's visit Diabetes, Other  General Interventions   General Interventions Discussed/Reviewed General Interventions Reviewed, Durable Medical Equipment (DME), Doctor Visits  Doctor Visits Discussed/Reviewed Doctor Visits Reviewed, PCP, Specialist  Durable Medical Equipment (DME) Glucomoter  PCP/Specialist Visits Compliance with follow-up visit  Communication with PCP/Specialists, RN  [continuous glucose meter that does not have to use wifi or mobile data connection is Freestyle Libre 3 on the 2024 humana medicare gold choice HMO plan may need pre authorization. Informed pcp RN pt requests assist to obtain one if possible]  Exercise Interventions   Exercise Discussed/Reviewed Exercise Reviewed, Physical Activity  Physical Activity Discussed/Reviewed Physical Activity Reviewed, Home Exercise Program (HEP)  [stays active with lawn care and house work]  Education Interventions   Education Provided Provided Education  [discussed the importance of obtaining cardiac clearance before deep dental cleaning related to taking aspirin & xarelto]              Our next appointment is by telephone on 01/15/23 at 1115  Please call the care guide team at [phone] if you need to cancel or reschedule your appointment.   If you are experiencing a Mental Health or Behavioral Health Crisis or  need someone to talk to, please call the Suicide and Crisis Lifeline: 988 call the [place] National Suicide Prevention Lifeline: 249-056-0803 or TTY: 223-026-6486 TTY 272-874-3027) to talk to a trained counselor call 1-800-273-TALK (toll free, 24 hour hotline) call the [facility]: [phone] call 911   The patient verbalized understanding of instructions, educational materials, and care plan provided today and DECLINED offer to receive copy of patient instructions, educational materials, and care plan.   The patient has been provided with contact information for the care management team and has been advised to call with any health related questions or concerns.   Rilley Stash L. Noelle Penner, RN, BSN, CCM [facility] Care Management Community Coordinator Office number [phone]

## 2022-11-05 NOTE — Patient Outreach (Signed)
  Care Coordination   Follow Up Visit Note   11/05/2022 Name: Patrick Moss MRN: [id number] DOB: [birth year]  Patrick Moss is a 82 y.o. year old male who sees [name], MD for primary care. I spoke with  Patrick Moss by phone today.  What matters to the patients health and wellness today?  Diabetes management- "doing really good" Still using a finger stick glucose meter. Did not get a continuous blood monitor.  Insulin (Basaglar)  is now decrease, cbg 109 this morning, rarely over 150  average from 99-130 Does not have a mobile phone with good reception for a regular continuous glucose meter Reports understanding that a [employer] does not require wifi or data connection Inquired if MD could be informed and assist with a [employer] He reports a loss of 40 lbs  Still uses a cane to walk with after he broke his hip, Reports he stills has some pain when he steps down on his right foot or over    Dentist appointment completed on last week He voices understanding of needing cardiac clearing prior to a deep dental cleaning related to anticoagulants  He is keeping active by mowing and home errands Overall he reports   Nasal congestion resolved   Goals Addressed               This Visit's Progress     Patient Stated     COMPLETED: nasal congestion management (THN) (pt-stated)        Care Coordination Interventions: Completed goal  Nasal congestion, respiratory symptoms resolved      Other     [facility] care coordination services, Diabetes, dental services+        Interventions Today    Flowsheet Row Most Recent Value  Chronic Disease   Chronic disease during today's visit Diabetes, Other  General Interventions   General Interventions Discussed/Reviewed General Interventions Reviewed, Durable Medical Equipment (DME), Doctor Visits  Doctor Visits Discussed/Reviewed Doctor Visits Reviewed, PCP, Specialist  Durable Medical Equipment (DME) Glucomoter   PCP/Specialist Visits Compliance with follow-up visit  Communication with PCP/Specialists, RN  [continuous glucose meter that does not have to use wifi or mobile data connection is Freestyle Libre 3 on the 2024 [employer] gold choice HMO plan may need pre authorization. Informed pcp RN pt requests assist to obtain one if possible]  Exercise Interventions   Exercise Discussed/Reviewed Exercise Reviewed, Physical Activity  Physical Activity Discussed/Reviewed Physical Activity Reviewed, Home Exercise Program (HEP)  Patrick Moss active with lawn care and house work]  Education Interventions   Education Provided Provided Education  [discussed the importance of obtaining cardiac clearance before deep dental cleaning related to taking aspirin & xarelto]              SDOH assessments and interventions completed:  No     Care Coordination Interventions:  Yes, provided   Follow up plan: Follow up call scheduled for 01/15/23    Encounter Outcome:  Pt. Visit Completed   Patrick Renier L. Noelle Penner, RN, BSN, CCM [facility] Care Management Community Coordinator Office number [phone]

## 2022-11-11 ENCOUNTER — Other Ambulatory Visit: Payer: Self-pay | Admitting: Family Medicine

## 2022-11-11 MED ORDER — FREESTYLE LIBRE 3 READER DEVI: 1.0000 [IU] | Freq: Four times a day (QID) | 99 refills | Status: DC | PRN

## 2022-11-11 MED ORDER — FREESTYLE LIBRE 3 SENSOR MISC: 1.0000 [IU] | 3 refills | Status: DC | PRN

## 2022-11-17 ENCOUNTER — Encounter: Payer: Self-pay | Admitting: Family Medicine

## 2022-11-17 ENCOUNTER — Ambulatory Visit (INDEPENDENT_AMBULATORY_CARE_PROVIDER_SITE_OTHER): Payer: Medicare HMO | Admitting: Family Medicine

## 2022-11-17 ENCOUNTER — Other Ambulatory Visit: Payer: Self-pay | Admitting: *Deleted

## 2022-11-17 VITALS — BP 152/65 | HR 69 | Temp 97.5°F | Ht 66.0 in | Wt 169.6 lb

## 2022-11-17 DIAGNOSIS — I152 Hypertension secondary to endocrine disorders: Secondary | ICD-10-CM

## 2022-11-17 DIAGNOSIS — E782 Mixed hyperlipidemia: Secondary | ICD-10-CM | POA: Diagnosis not present

## 2022-11-17 DIAGNOSIS — E119 Type 2 diabetes mellitus without complications: Secondary | ICD-10-CM | POA: Diagnosis not present

## 2022-11-17 DIAGNOSIS — M47816 Spondylosis without myelopathy or radiculopathy, lumbar region: Secondary | ICD-10-CM

## 2022-11-17 DIAGNOSIS — E785 Hyperlipidemia, unspecified: Secondary | ICD-10-CM | POA: Diagnosis not present

## 2022-11-17 DIAGNOSIS — E1159 Type 2 diabetes mellitus with other circulatory complications: Secondary | ICD-10-CM

## 2022-11-17 DIAGNOSIS — Z794 Long term (current) use of insulin: Secondary | ICD-10-CM

## 2022-11-17 LAB — CMP14+EGFR: Total Protein: 6.2 g/dL (ref 6.0–8.5)

## 2022-11-17 LAB — LIPID PANEL

## 2022-11-17 LAB — CBC WITH DIFFERENTIAL/PLATELET
Immature Grans (Abs): 0 10*3/uL (ref 0.0–0.1)
MCH: 29.7 pg (ref 26.6–33.0)
Monocytes Absolute: 0.3 10*3/uL (ref 0.1–0.9)
Neutrophils Absolute: 3.4 10*3/uL (ref 1.4–7.0)
Platelets: 211 10*3/uL (ref 150–450)
WBC: 5.3 10*3/uL (ref 3.4–10.8)

## 2022-11-17 LAB — BAYER DCA HB A1C WAIVED: HB A1C (BAYER DCA - WAIVED): 6.1 % — ABNORMAL HIGH (ref 4.8–5.6)

## 2022-11-17 MED ORDER — LISINOPRIL 10 MG PO TABS
10.0000 mg | ORAL_TABLET | Freq: Every day | ORAL | 3 refills | Status: DC
Start: 2022-11-17 — End: 2023-02-19

## 2022-11-17 MED ORDER — ROSUVASTATIN CALCIUM 5 MG PO TABS: 5.0000 mg | ORAL_TABLET | Freq: Every day | ORAL | 3 refills | Status: DC

## 2022-11-17 MED ORDER — CELECOXIB 400 MG PO CAPS: 400.0000 mg | ORAL_CAPSULE | Freq: Every day | ORAL | 3 refills | Status: DC

## 2022-11-17 NOTE — Progress Notes (Signed)
Subjective:  Patient ID: Patrick Moss,  male    DOB: [birth year]  Age: 82 y.o.    CC: Medical Management of Chronic Issues   HPI Patrick Moss presents for  follow-up of hypertension. Multiple elevated readings. Says it is good elsewhere  Patient also  in for follow-up of elevated cholesterol. Doing well without complaints on current medication. Denies side effects  including myalgia and arthralgia and nausea. Also in today for liver function testing. Currently no chest pain, shortness of breath or other cardiovascular related symptoms noted.  Follow-up of diabetes. Patient does check blood sugar at home. Readings run between 80 and 130 Patient denies symptoms such as excessive hunger or urinary frequency, excessive hunger, nausea No significant hypoglycemic spells noted. Medications reviewed. Pt reports taking them regularly. Pt. denies complication/adverse reaction today.    History Patrick Moss has a past medical history of Borderline glaucoma of both eyes, BPH associated with nocturia, CKD (chronic kidney disease), stage II, Complication of anesthesia, Hyperlipidemia, mixed, Hypertension, Malignant neoplasm prostate (HCC) (11/2021), OA (osteoarthritis), Type 2 diabetes mellitus treated with insulin (HCC), Wears dentures, and Wears glasses.   Patrick Moss has a past surgical history that includes Femur IM nail (Right, 05/03/2021); Foot fracture surgery (Right); Inguinal hernia repair (Bilateral, 2007); Gold seed implant (N/A, 02/18/2022); SPACE OAR INSTILLATION (N/A, 02/18/2022); Radiology with anesthesia (N/A, 05/14/2022); IR ANGIO INTRA EXTRACRAN SEL COM CAROTID INNOMINATE UNI R MOD SED (05/14/2022); IR INTRAVSC STENT CERV CAROTID W/EMB-PROT MOD SED (05/14/2022); IR ANGIO VERTEBRAL SEL VERTEBRAL UNI R MOD SED (05/14/2022); and IR Radiologist Eval & Mgmt (06/03/2022).   His family history includes Diabetes in his brother, father, and mother; Stroke in his father and mother.Patrick Moss reports that Patrick Moss has never  smoked. Patrick Moss has never used smokeless tobacco. Patrick Moss reports that Patrick Moss does not drink alcohol and does not use drugs.  Current Outpatient Medications on File Prior to Visit  Medication Sig Dispense Refill   aspirin 81 MG chewable tablet Chew 1 tablet (81 mg total) by mouth daily. 30 tablet 11   B-D ULTRAFINE III SHORT PEN 31G X 8 MM MISC USE  1  PEN  NEEDLE AS DIRECTED 270 each 3   Blood Glucose Monitoring Suppl DEVI 1 each by Does not apply route in the morning, at noon, and at bedtime. May substitute to any manufacturer covered by patient's insurance. 1 each 0   calcium carbonate (OSCAL) 1500 (600 Ca) MG TABS tablet Take 1,500 mg by mouth 2 (two) times daily with a meal.     celecoxib (CELEBREX) 400 MG capsule Take 1 capsule (400 mg total) by mouth daily. With food 90 capsule 3   Cholecalciferol (VITAMIN D3) 50 MCG (2000 UT) TABS Take 1 capsule by mouth daily.     clopidogrel (PLAVIX) 75 MG tablet Take 1 tablet (75 mg total) by mouth daily. 90 tablet 3   Continuous Glucose Receiver (FREESTYLE LIBRE 3 READER) DEVI 1 Units by Does not apply route 4 (four) times daily as needed. 1 each PRN   Continuous Glucose Sensor (FREESTYLE LIBRE 3 SENSOR) MISC 1 Units by Does not apply route every 2 (two) hours as needed. 6 each 3   diclofenac Sodium (VOLTAREN) 1 % GEL Apply 4 g topically 4 (four) times daily as needed. For the knee     Dulaglutide (TRULICITY) 1.5 MG/0.5ML SOPN Inject 1.5 mg into the skin once a week. Saturday's 6 mL 3   Glucose Blood (BLOOD GLUCOSE TEST STRIPS) STRP 1 each by In Vitro  route in the morning, at noon, and at bedtime. May substitute to any manufacturer covered by patient's insurance. 100 strip 5   glucose blood (TRUE METRIX BLOOD GLUCOSE TEST) test strip TEST BLOOD SUGAR TWO TO THREE TIMES DAILY 300 strip 3   Insulin Glargine (BASAGLAR KWIKPEN) 100 UNIT/ML Inject 20 Units into the skin daily. 45 mL 4   metFORMIN (GLUCOPHAGE-XR) 500 MG 24 hr tablet Take 2 tablets (1,000 mg total) by mouth  daily with breakfast. 180 tablet 03   omega-3 fish oil (MAXEPA) 1000 MG CAPS capsule Take 1 capsule by mouth daily.     pantoprazole (PROTONIX) 40 MG tablet Take 1 tablet (40 mg total) by mouth daily. 90 tablet 3   Current Facility-Administered Medications on File Prior to Visit  Medication Dose Route Frequency Provider Last Rate Last Admin   sodium phosphate (FLEET) 7-19 GM/118ML enema 1 enema  1 enema Rectal Once Belva Agee, MD        ROS Review of Systems  Constitutional:  Negative for fever.  Respiratory:  Negative for shortness of breath.   Cardiovascular:  Negative for chest pain.  Musculoskeletal:  Positive for gait problem (RLE - due to old fracture and CVA, exercises it, no pain,but weak if walking over 50 feet.). Negative for arthralgias.  Skin:  Negative for rash.    Objective:  BP (!) 152/65   Pulse 69   Temp (!) 97.5 F (36.4 C)   Ht 5\' 6"  (1.676 m)   Wt 169 lb 9.6 oz (76.9 kg)   SpO2 99%   BMI 27.37 kg/m   BP Readings from Last 3 Encounters:  11/17/22 (!) 152/65  09/09/22 (!) 162/79  08/04/22 (!) 168/96    Wt Readings from Last 3 Encounters:  11/17/22 169 lb 9.6 oz (76.9 kg)  10/07/22 162 lb (73.5 kg)  09/09/22 161 lb 3.2 oz (73.1 kg)     Physical Exam Vitals reviewed.  Constitutional:      Appearance: Patrick Moss is well-developed.  HENT:     Head: Normocephalic and atraumatic.     Right Ear: External ear normal.     Left Ear: External ear normal.     Mouth/Throat:     Pharynx: No oropharyngeal exudate or posterior oropharyngeal erythema.  Eyes:     Pupils: Pupils are equal, round, and reactive to light.  Cardiovascular:     Rate and Rhythm: Normal rate and regular rhythm.     Heart sounds: No murmur heard. Pulmonary:     Effort: No respiratory distress.     Breath sounds: Normal breath sounds.  Musculoskeletal:        General: Normal range of motion.     Cervical back: Normal range of motion and neck supple.  Neurological:     Mental  Status: Patrick Moss is alert and oriented to person, place, and time.     Diabetic Foot Exam - Simple   No data filed     Lab Results  Component Value Date   HGBA1C 6.4 (H) 08/04/2022   HGBA1C 6.1 (H) 05/07/2022   HGBA1C 6.1 (H) 01/09/2022    Assessment & Plan:   Elimelech was seen today for medical management of chronic issues.  Diagnoses and all orders for this visit:  Hyperlipidemia, unspecified hyperlipidemia type  Hypertension associated with diabetes (HCC) -     CBC with Differential/Platelet -     CMP14+EGFR  Type 2 diabetes mellitus without complication, with long-term current use of insulin (HCC) -  Bayer DCA Hb A1c Waived  Mixed hyperlipidemia -     Lipid panel -     rosuvastatin (CRESTOR) 5 MG tablet; Take 1 tablet (5 mg total) by mouth daily.  Other orders -     lisinopril (ZESTRIL) 10 MG tablet; Take 1 tablet (10 mg total) by mouth daily.   I am having Kasten L. Cordrey start on lisinopril. I am also having him maintain his B-D ULTRAFINE III SHORT PEN, Vitamin D3, omega-3 fish oil, calcium carbonate, diclofenac Sodium, aspirin, metFORMIN, True Metrix Blood Glucose Test, pantoprazole, clopidogrel, celecoxib, Blood Glucose Monitoring Suppl, BLOOD GLUCOSE TEST STRIPS, Basaglar KwikPen, Trulicity, [employer] 3 Reader, [employer] 3 Sensor, and rosuvastatin.  Meds ordered this encounter  Medications   rosuvastatin (CRESTOR) 5 MG tablet    Sig: Take 1 tablet (5 mg total) by mouth daily.    Dispense:  90 tablet    Refill:  3   lisinopril (ZESTRIL) 10 MG tablet    Sig: Take 1 tablet (10 mg total) by mouth daily.    Dispense:  90 tablet    Refill:  3     Follow-up: Return in about 3 months (around 02/17/2023).  [name], M.D.

## 2022-11-18 LAB — CBC WITH DIFFERENTIAL/PLATELET
Basophils Absolute: 0 10*3/uL (ref 0.0–0.2)
Basos: 0 %
EOS (ABSOLUTE): 0.1 10*3/uL (ref 0.0–0.4)
Eos: 2 %
Hematocrit: 40.7 % (ref 37.5–51.0)
Hemoglobin: 13.2 g/dL (ref 13.0–17.7)
Immature Granulocytes: 1 %
Lymphocytes Absolute: 1.4 10*3/uL (ref 0.7–3.1)
Lymphs: 26 %
MCHC: 32.4 g/dL (ref 31.5–35.7)
MCV: 92 fL (ref 79–97)
Monocytes: 6 %
Neutrophils: 65 %
RBC: 4.44 x10E6/uL (ref 4.14–5.80)
RDW: 13.1 % (ref 11.6–15.4)

## 2022-11-18 LAB — CMP14+EGFR
AST: 14 IU/L (ref 0–40)
Albumin/Globulin Ratio: 1.7 (ref 1.2–2.2)
Alkaline Phosphatase: 125 IU/L — ABNORMAL HIGH (ref 44–121)
BUN: 14 mg/dL (ref 8–27)
CO2: 25 mmol/L (ref 20–29)
Calcium: 10.1 mg/dL (ref 8.6–10.2)
Chloride: 105 mmol/L (ref 96–106)
Creatinine, Ser: 1.09 mg/dL (ref 0.76–1.27)
Globulin, Total: 2.3 g/dL (ref 1.5–4.5)
Glucose: 121 mg/dL — ABNORMAL HIGH (ref 70–99)
Potassium: 4.6 mmol/L (ref 3.5–5.2)
Sodium: 143 mmol/L (ref 134–144)
eGFR: 69 mL/min/{1.73_m2} (ref >59)

## 2022-11-18 LAB — LIPID PANEL: HDL: 52 mg/dL (ref >39)

## 2022-11-18 NOTE — Progress Notes (Signed)
Hello Patrick Moss,  Your lab result is normal and/or stable.Some minor variations that are not significant are commonly marked abnormal, but do not represent any medical problem for you.  Best regards, Eren Ryser, M.D.

## 2022-11-25 ENCOUNTER — Telehealth: Payer: Self-pay

## 2022-11-25 NOTE — Telephone Encounter (Signed)
*  Primary   PA request received via CMM for FreeStyle Libre 3 Sensor  PA submitted to [facility] and is pending additional questions/determination  Key: [id number]

## 2022-11-25 NOTE — Telephone Encounter (Signed)
*  Primary  PA request received via CMM for FreeStyle Libre 3 Reader device  PA submitted to [facility] and is pending additional questions/determination  Key: [id number]

## 2022-11-29 NOTE — Telephone Encounter (Signed)
Patient Advocate Encounter  Prior Authorization for [employer] Reader device has been approved through [place].    Key: [id number]  Effective: 11-25-2022 to 06-15-2023

## 2022-11-29 NOTE — Telephone Encounter (Signed)
Patient Advocate Encounter  Prior Authorization for [employer] Sensor has been approved through [place].    Key: [id number]  Effective: 11-25-2022 to 06-15-2023

## 2022-12-29 ENCOUNTER — Other Ambulatory Visit: Payer: Self-pay | Admitting: Family Medicine

## 2022-12-29 ENCOUNTER — Telehealth: Payer: Self-pay | Admitting: Family Medicine

## 2022-12-29 MED ORDER — LANCET DEVICE MISC: 1.0000 | Freq: Three times a day (TID) | 0 refills | Status: AC

## 2022-12-29 MED ORDER — BLOOD GLUCOSE TEST VI STRP: 1.0000 | ORAL_STRIP | Freq: Three times a day (TID) | 10 refills | Status: DC

## 2022-12-29 MED ORDER — LANCETS MISC. MISC: 1.0000 | Freq: Three times a day (TID) | 0 refills | Status: AC

## 2022-12-29 MED ORDER — BLOOD GLUCOSE MONITORING SUPPL DEVI: 1.0000 | Freq: Three times a day (TID) | 0 refills | Status: DC

## 2022-12-29 NOTE — Telephone Encounter (Signed)
 PATIENT AWARE

## 2022-12-29 NOTE — Telephone Encounter (Signed)
hE WILL JUST HAVE TO CHECK WITH FINGER STICKS. I SENT IN A SCRIP TO [facility] FOR A monitor and supplies

## 2022-12-29 NOTE — Telephone Encounter (Signed)
Patient calling about FreeStyle Josephine Igo, said that it is too expensive and he reached out to insurance about it already. Wants to know what he can do or if something else can be called in

## 2023-01-06 ENCOUNTER — Telehealth: Payer: Medicare HMO | Admitting: Adult Health

## 2023-01-15 ENCOUNTER — Ambulatory Visit: Payer: Self-pay | Admitting: *Deleted

## 2023-01-15 NOTE — Patient Instructions (Addendum)
Visit Information  Thank you for taking time to visit with me today. Please don't hesitate to contact me if I can be of assistance to you.   Following are the goals we discussed today:   Goals Addressed             This Visit's Progress    [facility] care coordination services, Diabetes, dental services+   On track    Interventions Today    Flowsheet Row Most Recent Value  Chronic Disease   Chronic disease during today's visit Diabetes, Hypertension (HTN)  General Interventions   General Interventions Discussed/Reviewed General Interventions Reviewed, Doctor Visits  Doctor Visits Discussed/Reviewed Doctor Visits Reviewed, PCP  PCP/Specialist Visits Compliance with follow-up visit  Exercise Interventions   Exercise Discussed/Reviewed Exercise Reviewed, Physical Activity  Physical Activity Discussed/Reviewed Physical Activity Reviewed  Mental Health Interventions   Mental Health Discussed/Reviewed Mental Health Discussed, Coping Strategies  Nutrition Interventions   Nutrition Discussed/Reviewed Nutrition Discussed, Decreasing sugar intake  Pharmacy Interventions   Pharmacy Dicussed/Reviewed Pharmacy Topics Reviewed, Affording Medications  Safety Interventions   Safety Discussed/Reviewed Safety Discussed  [no falls]  Advanced Directive Interventions   Advanced Directives Discussed/Reviewed Advanced Directives Discussed, Advanced Care Planning              Our next appointment is by telephone on 03/17/23 at 1115  Please call the care guide team at [phone] if you need to cancel or reschedule your appointment.   If you are experiencing a Mental Health or Behavioral Health Crisis or need someone to talk to, please call the Suicide and Crisis Lifeline: 988 call the [place] National Suicide Prevention Lifeline: 808 410 4773 or TTY: 442-042-9972 TTY 269-410-2972) to talk to a trained counselor call 1-800-273-TALK (toll free, 24 hour hotline) go to [facility] [address] [place] [phone]) call 911   No computer access, no preference for copy of AVS    The patient has been provided with contact information for the care management team and has been advised to call with any health related questions or concerns.   Cabela Pacifico L. Noelle Penner, RN, BSN, CCM [facility] Care Management Community Coordinator Office number [phone]

## 2023-01-15 NOTE — Patient Outreach (Signed)
  Care Coordination   Follow Up Visit Note   01/15/2023 Name: Patrick Moss MRN: [id number] DOB: 03-30-1941  Patrick Moss is a 82 y.o. year old male who sees Patrick Claude, MD for primary care. I spoke with  Patrick Moss by phone today.  What matters to the patients health and wellness today?  Doing good He denies any illnesses He is keeping active He mows his yard and cleaning his home    Diabetes cbg 100-170    He reports some friends    Goals Addressed             This Visit's Progress    [facility] care coordination services, Diabetes, dental services+   On track    Interventions Today    Flowsheet Row Most Recent Value  Chronic Disease   Chronic disease during today's visit Diabetes, Hypertension (HTN)  General Interventions   General Interventions Discussed/Reviewed General Interventions Reviewed, Doctor Visits  Doctor Visits Discussed/Reviewed Doctor Visits Reviewed, PCP  PCP/Specialist Visits Compliance with follow-up visit  Exercise Interventions   Exercise Discussed/Reviewed Exercise Reviewed, Physical Activity  Physical Activity Discussed/Reviewed Physical Activity Reviewed  Mental Health Interventions   Mental Health Discussed/Reviewed Mental Health Discussed, Coping Strategies  Nutrition Interventions   Nutrition Discussed/Reviewed Nutrition Discussed, Decreasing sugar intake  Pharmacy Interventions   Pharmacy Dicussed/Reviewed Pharmacy Topics Reviewed, Affording Medications  Safety Interventions   Safety Discussed/Reviewed Safety Discussed  [no falls]  Advanced Directive Interventions   Advanced Directives Discussed/Reviewed Advanced Directives Discussed, Advanced Care Planning              SDOH assessments and interventions completed:  No     Care Coordination Interventions:  Yes, provided   Follow up plan: Follow up call scheduled for 03/17/23    Encounter Outcome:  Pt. Visit Completed   Patrick Christine L. Noelle Penner, RN, BSN, CCM [facility] Care  Management Community Coordinator Office number [phone]

## 2023-01-20 ENCOUNTER — Telehealth: Payer: Medicare HMO | Admitting: Adult Health

## 2023-02-19 ENCOUNTER — Ambulatory Visit (INDEPENDENT_AMBULATORY_CARE_PROVIDER_SITE_OTHER): Payer: Medicare HMO | Admitting: Family Medicine

## 2023-02-19 ENCOUNTER — Encounter: Payer: Self-pay | Admitting: Family Medicine

## 2023-02-19 VITALS — BP 156/83 | HR 67 | Temp 97.5°F | Ht 66.0 in | Wt 177.8 lb

## 2023-02-19 DIAGNOSIS — E1159 Type 2 diabetes mellitus with other circulatory complications: Secondary | ICD-10-CM

## 2023-02-19 DIAGNOSIS — E785 Hyperlipidemia, unspecified: Secondary | ICD-10-CM | POA: Diagnosis not present

## 2023-02-19 DIAGNOSIS — E119 Type 2 diabetes mellitus without complications: Secondary | ICD-10-CM | POA: Diagnosis not present

## 2023-02-19 DIAGNOSIS — Z794 Long term (current) use of insulin: Secondary | ICD-10-CM | POA: Diagnosis not present

## 2023-02-19 DIAGNOSIS — I152 Hypertension secondary to endocrine disorders: Secondary | ICD-10-CM | POA: Diagnosis not present

## 2023-02-19 LAB — BAYER DCA HB A1C WAIVED: HB A1C (BAYER DCA - WAIVED): 6.5 % — ABNORMAL HIGH (ref 4.8–5.6)

## 2023-02-19 MED ORDER — VITAMIN D3 50 MCG (2000 UT) PO TABS: 50.0000 ug | ORAL_TABLET | Freq: Every day | ORAL | 3 refills | Status: AC

## 2023-02-19 MED ORDER — FEXOFENADINE HCL 180 MG PO TABS: 180.0000 mg | ORAL_TABLET | Freq: Every day | ORAL | 3 refills | Status: DC

## 2023-02-19 MED ORDER — LISINOPRIL 20 MG PO TABS: 20.0000 mg | ORAL_TABLET | Freq: Every day | ORAL | 3 refills | Status: DC

## 2023-02-19 MED ORDER — BLOOD GLUCOSE TEST VI STRP: 1.0000 | ORAL_STRIP | Freq: Three times a day (TID) | 10 refills | Status: AC

## 2023-02-19 NOTE — Progress Notes (Signed)
Subjective:  Patient ID: Patrick Moss,  male    DOB: 1941-03-08  Age: 82 y.o.    CC: Medical Management of Chronic Issues   HPI Patrick Moss presents for  follow-up of hypertension. Patient has no history of headache chest pain or shortness of breath or recent cough. Patient also denies symptoms of TIA such as numbness weakness lateralizing. Patient denies side effects from medication. States taking it regularly.  Patient also  in for follow-up of elevated cholesterol. Doing well without complaints on current medication. Denies side effects  including myalgia and arthralgia and nausea. Also in today for liver function testing. Currently no chest pain, shortness of breath or other cardiovascular related symptoms noted.  Follow-up of diabetes. Patient does check blood sugar at home. Readings run between 89-130 Patient denies symptoms such as excessive hunger or urinary frequency, excessive hunger, nausea No significant hypoglycemic spells noted. Medications reviewed. Pt reports taking them regularly. Pt. denies complication/adverse reaction today.    History Patrick Moss has a past medical history of Borderline glaucoma of both eyes, BPH associated with nocturia, CKD (chronic kidney disease), stage II, Complication of anesthesia, Hyperlipidemia, mixed, Hypertension, Malignant neoplasm prostate (HCC) (11/2021), OA (osteoarthritis), Type 2 diabetes mellitus treated with insulin (HCC), Wears dentures, and Wears glasses.   Patrick Moss has a past surgical history that includes Femur IM nail (Right, 05/03/2021); Foot fracture surgery (Right); Inguinal hernia repair (Bilateral, 2007); Gold seed implant (N/A, 02/18/2022); SPACE OAR INSTILLATION (N/A, 02/18/2022); Radiology with anesthesia (N/A, 05/14/2022); IR ANGIO INTRA EXTRACRAN SEL COM CAROTID INNOMINATE UNI R MOD SED (05/14/2022); IR INTRAVSC STENT CERV CAROTID W/EMB-PROT MOD SED (05/14/2022); IR ANGIO VERTEBRAL SEL VERTEBRAL UNI R MOD SED (05/14/2022); and IR  Radiologist Eval & Mgmt (06/03/2022).   His family history includes Diabetes in his brother, father, and mother; Stroke in his father and mother.Patrick Moss reports that Patrick Moss has never smoked. Patrick Moss has never used smokeless tobacco. Patrick Moss reports that Patrick Moss does not drink alcohol and does not use drugs.  Current Outpatient Medications on File Prior to Visit  Medication Sig Dispense Refill   aspirin 81 MG chewable tablet Chew 1 tablet (81 mg total) by mouth daily. 30 tablet 11   B-D ULTRAFINE III SHORT PEN 31G X 8 MM MISC USE  1  PEN  NEEDLE AS DIRECTED 270 each 3   Blood Glucose Monitoring Suppl DEVI 1 each by Does not apply route in the morning, at noon, and at bedtime. May substitute to any manufacturer covered by patient's insurance. 1 each 0   calcium carbonate (OSCAL) 1500 (600 Ca) MG TABS tablet Take 1,500 mg by mouth 2 (two) times daily with a meal.     celecoxib (CELEBREX) 400 MG capsule Take 1 capsule (400 mg total) by mouth daily. With food 90 capsule 3   clopidogrel (PLAVIX) 75 MG tablet Take 1 tablet (75 mg total) by mouth daily. 90 tablet 3   Continuous Glucose Receiver (FREESTYLE LIBRE 3 READER) DEVI 1 Units by Does not apply route 4 (four) times daily as needed. 1 each PRN   Continuous Glucose Sensor (FREESTYLE LIBRE 3 SENSOR) MISC 1 Units by Does not apply route every 2 (two) hours as needed. 6 each 3   diclofenac Sodium (VOLTAREN) 1 % GEL Apply 4 g topically 4 (four) times daily as needed. For the knee     Dulaglutide (TRULICITY) 1.5 MG/0.5ML SOPN Inject 1.5 mg into the skin once a week. Saturday's 6 mL 3   Insulin Glargine [facility])  100 UNIT/ML Inject 20 Units into the skin daily. 45 mL 4   metFORMIN (GLUCOPHAGE-XR) 500 MG 24 hr tablet Take 2 tablets (1,000 mg total) by mouth daily with breakfast. 180 tablet 03   omega-3 fish oil (MAXEPA) 1000 MG CAPS capsule Take 1 capsule by mouth daily.     pantoprazole (PROTONIX) 40 MG tablet Take 1 tablet (40 mg total) by mouth daily. 90 tablet 3    rosuvastatin (CRESTOR) 5 MG tablet Take 1 tablet (5 mg total) by mouth daily. 90 tablet 3   Current Facility-Administered Medications on File Prior to Visit  Medication Dose Route Frequency Provider Last Rate Last Admin   sodium phosphate (FLEET) 7-19 GM/118ML enema 1 enema  1 enema Rectal Once Belva Agee, MD        ROS Review of Systems  Constitutional:  Negative for fever.  HENT:  Positive for postnasal drip.   Respiratory:  Negative for shortness of breath.   Cardiovascular:  Negative for chest pain.  Musculoskeletal:  Negative for arthralgias.  Skin:  Negative for rash.    Objective:  BP (!) 156/83   Pulse 67   Temp (!) 97.5 F (36.4 C)   Ht 5\' 6"  (1.676 m)   Wt 177 lb 12.8 oz (80.6 kg)   SpO2 98%   BMI 28.70 kg/m   BP Readings from Last 3 Encounters:  02/19/23 (!) 156/83  11/17/22 (!) 152/65  09/09/22 (!) 162/79    Wt Readings from Last 3 Encounters:  02/19/23 177 lb 12.8 oz (80.6 kg)  11/17/22 169 lb 9.6 oz (76.9 kg)  10/07/22 162 lb (73.5 kg)     Physical Exam Vitals reviewed.  Constitutional:      Appearance: Patrick Moss is well-developed.  HENT:     Head: Normocephalic and atraumatic.     Right Ear: External ear normal.     Left Ear: External ear normal.     Mouth/Throat:     Pharynx: No oropharyngeal exudate or posterior oropharyngeal erythema.  Eyes:     Pupils: Pupils are equal, round, and reactive to light.  Cardiovascular:     Rate and Rhythm: Normal rate and regular rhythm.     Heart sounds: No murmur heard. Pulmonary:     Effort: No respiratory distress.     Breath sounds: Normal breath sounds.  Musculoskeletal:     Cervical back: Normal range of motion and neck supple.  Neurological:     Mental Status: Patrick Moss is alert and oriented to person, place, and time.     Diabetic Foot Exam - Simple   No data filed     Lab Results  Component Value Date   HGBA1C 6.1 (H) 11/17/2022   HGBA1C 6.4 (H) 08/04/2022   HGBA1C 6.1 (H) 05/07/2022     Assessment & Plan:   Patrick Moss was seen today for medical management of chronic issues.  Diagnoses and all orders for this visit:  Hyperlipidemia, unspecified hyperlipidemia type -     Lipid panel  Hypertension associated with diabetes (HCC) -     CBC with Differential/Platelet -     CMP14+EGFR  Type 2 diabetes mellitus without complication, with long-term current use of insulin (HCC) -     Bayer DCA Hb A1c Waived  Other orders -     Cholecalciferol (VITAMIN D3) 50 MCG (2000 UT) TABS; Take 1 tablet (50 mcg total) by mouth daily. -     lisinopril (ZESTRIL) 20 MG tablet; Take 1 tablet (20 mg total) by  mouth daily. -     Glucose Blood (BLOOD GLUCOSE TEST STRIPS) STRP; 1 each by In Vitro route in the morning, at noon, and at bedtime. May substitute to any manufacturer covered by patient's insurance. -     fexofenadine (ALLEGRA) 180 MG tablet; Take 1 tablet (180 mg total) by mouth daily. For allergy symptoms   I have changed Shuaib L. Thorner's Vitamin D3 and lisinopril. I am also having him start on fexofenadine. Additionally, I am having him maintain his B-D ULTRAFINE III SHORT PEN, omega-3 fish oil, calcium carbonate, diclofenac Sodium, aspirin, metFORMIN, pantoprazole, clopidogrel, Basaglar KwikPen, Trulicity, [employer] 3 Reader, [employer] 3 Sensor, rosuvastatin, celecoxib, Blood Glucose Monitoring Suppl, and BLOOD GLUCOSE TEST STRIPS.  Meds ordered this encounter  Medications   Cholecalciferol (VITAMIN D3) 50 MCG (2000 UT) TABS    Sig: Take 1 tablet (50 mcg total) by mouth daily.    Dispense:  100 tablet    Refill:  3   lisinopril (ZESTRIL) 20 MG tablet    Sig: Take 1 tablet (20 mg total) by mouth daily.    Dispense:  90 tablet    Refill:  3   Glucose Blood (BLOOD GLUCOSE TEST STRIPS) STRP    Sig: 1 each by In Vitro route in the morning, at noon, and at bedtime. May substitute to any manufacturer covered by patient's insurance.    Dispense:  100 strip    Refill:  10    fexofenadine (ALLEGRA) 180 MG tablet    Sig: Take 1 tablet (180 mg total) by mouth daily. For allergy symptoms    Dispense:  90 tablet    Refill:  3     Follow-up: No follow-ups on file.  [name], M.D.

## 2023-02-20 ENCOUNTER — Other Ambulatory Visit: Payer: Self-pay | Admitting: Family Medicine

## 2023-02-20 LAB — LIPID PANEL
Chol/HDL Ratio: 2.7 ratio (ref 0.0–5.0)
Cholesterol, Total: 122 mg/dL (ref 100–199)
HDL: 46 mg/dL (ref >39)
LDL Chol Calc (NIH): 53 mg/dL (ref 0–99)
Triglycerides: 130 mg/dL (ref 0–149)
VLDL Cholesterol Cal: 23 mg/dL (ref 5–40)

## 2023-02-20 LAB — CMP14+EGFR
ALT: 16 IU/L (ref 0–44)
AST: 14 IU/L (ref 0–40)
Albumin: 4 g/dL (ref 3.8–4.8)
Alkaline Phosphatase: 108 IU/L (ref 44–121)
BUN/Creatinine Ratio: 11 (ref 10–24)
BUN: 14 mg/dL (ref 8–27)
Bilirubin Total: 0.4 mg/dL (ref 0.0–1.2)
CO2: 24 mmol/L (ref 20–29)
Calcium: 10 mg/dL (ref 8.6–10.2)
Chloride: 107 mmol/L — ABNORMAL HIGH (ref 96–106)
Creatinine, Ser: 1.22 mg/dL (ref 0.76–1.27)
Globulin, Total: 2.3 g/dL (ref 1.5–4.5)
Glucose: 133 mg/dL — ABNORMAL HIGH (ref 70–99)
Potassium: 4.6 mmol/L (ref 3.5–5.2)
Sodium: 144 mmol/L (ref 134–144)
Total Protein: 6.3 g/dL (ref 6.0–8.5)
eGFR: 60 mL/min/{1.73_m2} (ref >59)

## 2023-02-20 LAB — CBC WITH DIFFERENTIAL/PLATELET
Basophils Absolute: 0 10*3/uL (ref 0.0–0.2)
Basos: 0 %
EOS (ABSOLUTE): 0 10*3/uL (ref 0.0–0.4)
Eos: 0 %
Hematocrit: 38.2 % (ref 37.5–51.0)
Hemoglobin: 13.1 g/dL (ref 13.0–17.7)
Immature Grans (Abs): 0 10*3/uL (ref 0.0–0.1)
Immature Granulocytes: 0 %
Lymphocytes Absolute: 0.9 10*3/uL (ref 0.7–3.1)
Lymphs: 10 %
MCH: 30.5 pg (ref 26.6–33.0)
MCHC: 34.3 g/dL (ref 31.5–35.7)
MCV: 89 fL (ref 79–97)
Monocytes Absolute: 0.3 10*3/uL (ref 0.1–0.9)
Monocytes: 4 %
Neutrophils Absolute: 7.6 10*3/uL — ABNORMAL HIGH (ref 1.4–7.0)
Neutrophils: 86 %
Platelets: 191 10*3/uL (ref 150–450)
RBC: 4.29 x10E6/uL (ref 4.14–5.80)
RDW: 13.2 % (ref 11.6–15.4)
WBC: 9 10*3/uL (ref 3.4–10.8)

## 2023-02-23 NOTE — Progress Notes (Signed)
Hello Patrick Moss,  Your lab result is normal and/or stable.Some minor variations that are not significant are commonly marked abnormal, but do not represent any medical problem for you.  Best regards, Warren [name], M.D.

## 2023-02-26 ENCOUNTER — Other Ambulatory Visit: Payer: Medicare HMO

## 2023-02-26 ENCOUNTER — Encounter: Payer: Self-pay | Admitting: Family Medicine

## 2023-02-26 ENCOUNTER — Ambulatory Visit (INDEPENDENT_AMBULATORY_CARE_PROVIDER_SITE_OTHER): Payer: Medicare HMO | Admitting: Family Medicine

## 2023-02-26 VITALS — BP 131/58 | HR 62 | Temp 98.2°F | Ht 66.0 in | Wt 168.0 lb

## 2023-02-26 DIAGNOSIS — I499 Cardiac arrhythmia, unspecified: Secondary | ICD-10-CM | POA: Diagnosis not present

## 2023-02-26 DIAGNOSIS — I4891 Unspecified atrial fibrillation: Secondary | ICD-10-CM

## 2023-02-26 DIAGNOSIS — R051 Acute cough: Secondary | ICD-10-CM

## 2023-02-26 DIAGNOSIS — C61 Malignant neoplasm of prostate: Secondary | ICD-10-CM | POA: Diagnosis not present

## 2023-02-26 MED ORDER — APIXABAN 2.5 MG PO TABS
2.5000 mg | ORAL_TABLET | Freq: Two times a day (BID) | ORAL | 0 refills | Status: DC
Start: 2023-02-26 — End: 2023-03-23

## 2023-02-26 NOTE — Progress Notes (Signed)
Subjective:  Patient ID: Patrick Moss, male    DOB: 1941-06-07, 82 y.o.   MRN: [id number]  Patient Care Team: [name], MD as PCP - General (Family Medicine) Roma Kayser, MD as Consulting Physician (Endocrinology) Dione Booze, Bertram Millard, MD as Consulting Physician (Ophthalmology) Danella Maiers, [facility] as Pharmacist (Family Medicine) Pilson, Tyrone Apple, MD (Orthopedic Surgery) Clinton Gallant, RN as [facility] Care Management   Chief Complaint:  covid like symptoms (Fatigue/Loss of taste/Cough/Congestion/X 1 week)  HPI: Patrick Moss is a 82 y.o. male presenting on 02/26/2023 for covid like symptoms (Fatigue/Loss of taste/Cough/Congestion/X 1 week) Presents today with brother who supports history.   1. Acute cough States that he started feeling poorly last week. Reports that he was seen at [facility] by Dr. Darlyn Read and "everything was good" except he had PND at that time and was prescribed allegra.  States that he slipped from his recliner and hit his side several days ago. States that it was after last Thursday's appt. Reports that it is getting better. In addition, he has URI symptoms. Reports cough, congestion, fatigue, loss of taste, loss of appetite, and rhinorrhea. Reports a productive cough with white, clear phlegm. Denies fever. Endorses weakness and numbness in his legs  Has not taken anything OTC. He has not tested himself for covid.   Relevant past medical, surgical, family, and social history reviewed and updated as indicated.  Allergies and medications reviewed and updated. Data reviewed: Chart in Epic.  Past Medical History:  Diagnosis Date   Borderline glaucoma of both eyes    BPH associated with nocturia    CKD (chronic kidney disease), stage II    Complication of anesthesia    per pt emergent hiccups   Hyperlipidemia, mixed    Hypertension    Malignant neoplasm prostate (HCC) 11/2021   primary urologist---  dr dahlstadt/   radiation oncology [facility];  dx 06/ 2023,  Gleason 4+3,  PSA  13.2   OA (osteoarthritis)    knees   Type 2 diabetes mellitus treated with insulin (HCC)    followed by pcp ( previous seen by endocriologist--- dr g. nida ,lov note in epic 02-19-2021);    (02-11-2022  pt stated checks blood sugar twice daily,  fasting sugar average 90s  to 120s)   Wears dentures    upper   Wears glasses     Past Surgical History:  Procedure Laterality Date   FEMUR IM NAIL Right 05/03/2021   @[facility] --  W-S   FOOT FRACTURE SURGERY Right    2017;   ORIF , per pt plate under foot   GOLD SEED IMPLANT N/A 02/18/2022   Procedure: GOLD SEED IMPLANT;  Surgeon: Belva Agee, MD;  Location: [facility];  Service: Urology;  Laterality: N/A;  30 MINS   INGUINAL HERNIA REPAIR Bilateral 2007   approx   IR ANGIO INTRA EXTRACRAN SEL COM CAROTID INNOMINATE UNI R MOD SED  05/14/2022   IR ANGIO VERTEBRAL SEL VERTEBRAL UNI R MOD SED  05/14/2022   IR INTRAVSC STENT CERV CAROTID W/EMB-PROT MOD SED INCL ANGIO  05/14/2022   IR RADIOLOGIST EVAL & MGMT  06/03/2022   RADIOLOGY WITH ANESTHESIA N/A 05/14/2022   Procedure: R ICA stent;  Surgeon: Julieanne Cotton, MD;  Location: [facility] OR;  Service: Radiology;  Laterality: N/A;   SPACE OAR INSTILLATION N/A 02/18/2022   Procedure: SPACE OAR INSTILLATION;  Surgeon: Belva Agee, MD;  Location:  [facility];  Service: Urology;  Laterality: N/A;    Social History   Socioeconomic History   Marital status: Divorced    Spouse name: Not on file   Number of children: 2   Years of education: 10   Highest education level: 10th grade  Occupational History   Occupation: retired  Tobacco Use   Smoking status: Never   Smokeless tobacco: Never  Vaping Use   Vaping status: Never Used  Substance and Sexual Activity   Alcohol use: No    Alcohol/week: 0.0 standard drinks of alcohol   Drug use: Never   Sexual activity: Not on file   Other Topics Concern   Not on file  Social History Narrative   Lives alone - son lives next door   [place] friend on his street - they check on each other several times per day and spend time together   Social Determinants of Health   Financial Resource Strain: Low Risk  (10/07/2022)   Overall Financial Resource Strain (CARDIA)    Difficulty of Paying Living Expenses: Not hard at all  Food Insecurity: No Food Insecurity (10/07/2022)   Hunger Vital Sign    Worried About Running Out of Food in the Last Year: Never true    Ran Out of Food in the Last Year: Never true  Transportation Needs: No Transportation Needs (10/07/2022)   PRAPARE - Administrator, Civil Service (Medical): No    Lack of Transportation (Non-Medical): No  Physical Activity: Insufficiently Active (10/07/2022)   Exercise Vital Sign    Days of Exercise per Week: 3 days    Minutes of Exercise per Session: 30 min  Stress: No Stress Concern Present (10/07/2022)   [employer] of Occupational Health - Occupational Stress Questionnaire    Feeling of Stress : Not at all  Social Connections: Moderately Isolated (10/07/2022)   Social Connection and Isolation Panel [NHANES]    Frequency of Communication with Friends and Family: More than three times a week    Frequency of Social Gatherings with Friends and Family: More than three times a week    Attends Religious Services: More than 4 times per year    Active Member of [employer] or Organizations: No    Attends Banker Meetings: Never    Marital Status: Divorced  Catering manager Violence: Not At Risk (10/07/2022)   Humiliation, Afraid, Rape, and Kick questionnaire    Fear of Current or Ex-Partner: No    Emotionally Abused: No    Physically Abused: No    Sexually Abused: No    Outpatient Encounter Medications as of 02/26/2023  Medication Sig   aspirin 81 MG chewable tablet Chew 1 tablet (81 mg total) by mouth daily.   B-D ULTRAFINE III SHORT PEN 31G X  8 MM MISC USE  1  PEN  NEEDLE AS DIRECTED   Blood Glucose Monitoring Suppl DEVI 1 each by Does not apply route in the morning, at noon, and at bedtime. May substitute to any manufacturer covered by patient's insurance.   calcium carbonate (OSCAL) 1500 (600 Ca) MG TABS tablet Take 1,500 mg by mouth 2 (two) times daily with a meal.   celecoxib (CELEBREX) 400 MG capsule Take 1 capsule (400 mg total) by mouth daily. With food   Cholecalciferol (VITAMIN D3) 50 MCG (2000 UT) TABS Take 1 tablet (50 mcg total) by mouth daily.   clopidogrel (PLAVIX) 75 MG tablet Take 1 tablet (75 mg total) by mouth daily.  Continuous Glucose Receiver (FREESTYLE LIBRE 3 READER) DEVI USE AS DIRECTED   Continuous Glucose Sensor (FREESTYLE LIBRE 3 SENSOR) MISC 1 Units by Does not apply route every 2 (two) hours as needed.   diclofenac Sodium (VOLTAREN) 1 % GEL Apply 4 g topically 4 (four) times daily as needed. For the knee   Dulaglutide (TRULICITY) 1.5 MG/0.5ML SOPN Inject 1.5 mg into the skin once a week. Saturday's   fexofenadine (ALLEGRA) 180 MG tablet Take 1 tablet (180 mg total) by mouth daily. For allergy symptoms   Glucose Blood (BLOOD GLUCOSE TEST STRIPS) STRP 1 each by In Vitro route in the morning, at noon, and at bedtime. May substitute to any manufacturer covered by patient's insurance.   Insulin Glargine (BASAGLAR KWIKPEN) 100 UNIT/ML Inject 20 Units into the skin daily.   lisinopril (ZESTRIL) 20 MG tablet Take 1 tablet (20 mg total) by mouth daily.   metFORMIN (GLUCOPHAGE-XR) 500 MG 24 hr tablet Take 2 tablets (1,000 mg total) by mouth daily with breakfast.   omega-3 fish oil (MAXEPA) 1000 MG CAPS capsule Take 1 capsule by mouth daily.   pantoprazole (PROTONIX) 40 MG tablet Take 1 tablet (40 mg total) by mouth daily.   rosuvastatin (CRESTOR) 5 MG tablet Take 1 tablet (5 mg total) by mouth daily.   Facility-Administered Encounter Medications as of 02/26/2023  Medication   sodium phosphate (FLEET) 7-19 GM/118ML  enema 1 enema    Allergies  Allergen Reactions   Septra [Sulfamethoxazole-Trimethoprim] Hives    Review of Systems As per HPI  Objective:  BP (!) 131/58   Pulse 62   Temp 98.2 F (36.8 C)   Ht 5\' 6"  (1.676 m)   Wt 168 lb (76.2 kg)   SpO2 96%   BMI 27.12 kg/m    Wt Readings from Last 3 Encounters:  02/26/23 168 lb (76.2 kg)  02/19/23 177 lb 12.8 oz (80.6 kg)  11/17/22 169 lb 9.6 oz (76.9 kg)   Physical Exam Constitutional:      General: He is awake. He is not in acute distress.    Appearance: Normal appearance. He is well-developed and well-groomed. He is ill-appearing. He is not toxic-appearing or diaphoretic.  HENT:     Right Ear: No laceration, drainage, swelling or tenderness. A middle ear effusion is present. There is no impacted cerumen. No foreign body. No mastoid tenderness. No PE tube. No hemotympanum. Tympanic membrane is not injected, scarred, perforated, erythematous, retracted or bulging.     Left Ear: No laceration, drainage, swelling or tenderness. A middle ear effusion is present. There is no impacted cerumen. No foreign body. No mastoid tenderness. No PE tube. No hemotympanum. Tympanic membrane is not injected, scarred, perforated, erythematous, retracted or bulging.     Nose: Congestion and rhinorrhea present. Rhinorrhea is clear.     Right Sinus: No maxillary sinus tenderness or frontal sinus tenderness.     Left Sinus: No maxillary sinus tenderness or frontal sinus tenderness.     Mouth/Throat:     Lips: Pink. No lesions.     Mouth: Mucous membranes are moist.     Palate: No mass.     Pharynx: Oropharynx is clear. Posterior oropharyngeal erythema present. No pharyngeal swelling or oropharyngeal exudate.     Tonsils: No tonsillar exudate or tonsillar abscesses. 2+ on the right. 2+ on the left.  Cardiovascular:     Rate and Rhythm: Normal rate. Rhythm irregular.     Heart sounds: Normal heart sounds.  Pulmonary:  Effort: Pulmonary effort is normal.      Breath sounds: Normal breath sounds. No stridor, decreased air movement or transmitted upper airway sounds. No decreased breath sounds, wheezing, rhonchi or rales.  Musculoskeletal:     Cervical back: Full passive range of motion without pain.     Comments: Walks with cane  Lymphadenopathy:     Head:     Right side of head: No submental, submandibular, tonsillar, preauricular or posterior auricular adenopathy.     Left side of head: No submental, submandibular, tonsillar, preauricular or posterior auricular adenopathy.     Cervical:     Right cervical: No superficial, deep or posterior cervical adenopathy.    Left cervical: No superficial, deep or posterior cervical adenopathy.  Skin:    General: Skin is warm.     Capillary Refill: Capillary refill takes less than 2 seconds.  Neurological:     General: No focal deficit present.     Mental Status: He is alert, oriented to person, place, and time and easily aroused.  Psychiatric:        Attention and Perception: Attention and perception normal.        Mood and Affect: Mood and affect normal.        Speech: Speech normal.        Behavior: Behavior normal. Behavior is cooperative.        Thought Content: Thought content normal.        Cognition and Memory: Cognition normal.        Judgment: Judgment normal.    Results for orders placed or performed in visit on 02/19/23  CBC with Differential/Platelet  Result Value Ref Range   WBC 9.0 3.4 - 10.8 x10E3/uL   RBC 4.29 4.14 - 5.80 x10E6/uL   Hemoglobin 13.1 13.0 - 17.7 g/dL   Hematocrit 09.8 11.9 - 51.0 %   MCV 89 79 - 97 fL   MCH 30.5 26.6 - 33.0 pg   MCHC 34.3 31.5 - 35.7 g/dL   RDW 14.7 82.9 - 56.2 %   Platelets 191 150 - 450 x10E3/uL   Neutrophils 86 Not Estab. %   Lymphs 10 Not Estab. %   Monocytes 4 Not Estab. %   Eos 0 Not Estab. %   Basos 0 Not Estab. %   Neutrophils Absolute 7.6 (H) 1.4 - 7.0 x10E3/uL   Lymphocytes Absolute 0.9 0.7 - 3.1 x10E3/uL   Monocytes Absolute  0.3 0.1 - 0.9 x10E3/uL   EOS (ABSOLUTE) 0.0 0.0 - 0.4 x10E3/uL   Basophils Absolute 0.0 0.0 - 0.2 x10E3/uL   Immature Granulocytes 0 Not Estab. %   Immature Grans (Abs) 0.0 0.0 - 0.1 x10E3/uL  CMP14+EGFR  Result Value Ref Range   Glucose 133 (H) 70 - 99 mg/dL   BUN 14 8 - 27 mg/dL   Creatinine, Ser 1.30 0.76 - 1.27 mg/dL   eGFR 60 >86 VH/QIO/9.62   BUN/Creatinine Ratio 11 10 - 24   Sodium 144 134 - 144 mmol/L   Potassium 4.6 3.5 - 5.2 mmol/L   Chloride 107 (H) 96 - 106 mmol/L   CO2 24 20 - 29 mmol/L   Calcium 10.0 8.6 - 10.2 mg/dL   Total Protein 6.3 6.0 - 8.5 g/dL   Albumin 4.0 3.8 - 4.8 g/dL   Globulin, Total 2.3 1.5 - 4.5 g/dL   Bilirubin Total 0.4 0.0 - 1.2 mg/dL   Alkaline Phosphatase 108 44 - 121 IU/L   AST 14 0 - 40 IU/L  ALT 16 0 - 44 IU/L  Lipid panel  Result Value Ref Range   Cholesterol, Total 122 100 - 199 mg/dL   Triglycerides 829 0 - 149 mg/dL   HDL 46 >56 mg/dL   VLDL Cholesterol Cal 23 5 - 40 mg/dL   LDL Chol Calc (NIH) 53 0 - 99 mg/dL   Chol/HDL Ratio 2.7 0.0 - 5.0 ratio  Bayer DCA Hb A1c Waived  Result Value Ref Range   HB A1C (BAYER DCA - WAIVED) 6.5 (H) 4.8 - 5.6 %       02/26/2023    9:58 AM 02/19/2023    9:17 AM 11/17/2022    7:59 AM 08/04/2022    9:40 AM 07/28/2022    8:52 PM  Depression screen PHQ 2/9  Decreased Interest 0 0 0 0 0  Down, Depressed, Hopeless 0 0 0 0 0  PHQ - 2 Score 0 0 0 0 0  Altered sleeping 0      Tired, decreased energy 0      Change in appetite 0      Feeling bad or failure about yourself  0      Trouble concentrating 0      Moving slowly or fidgety/restless 0      Suicidal thoughts 0      PHQ-9 Score 0      Difficult doing work/chores Not difficult at all           02/26/2023    9:58 AM 08/27/2021    1:20 PM  GAD 7 : Generalized Anxiety Score  Nervous, Anxious, on Edge 0 0  Control/stop worrying 0 0  Worry too much - different things 0 0  Trouble relaxing 0 0  Restless 0 0  Easily annoyed or irritable 0 0   Afraid - awful might happen 0 0  Total GAD 7 Score 0 0  Anxiety Difficulty Not difficult at all Not difficult at all   Pertinent labs & imaging results that were available during my care of the patient were reviewed by me and considered in my medical decision making.  Assessment & Plan:  Lafe was seen today for covid like symptoms.  Diagnoses and all orders for this visit:  Acute cough Labs as below. Will communicate results to patient once available. Will await results to determine next steps.  Discussed with patient the etiology of symptoms could be viral or bacterial. Will test as below and consider antibiotic once resulted.  -     COVID-19, Flu A+B and RSV  Irregular heart beat Confirmed atrial fibrillation. will start low dose eliquis due to patient age. Referral placed to Cardiology as below. Discussed with pt to stop celebrex due to increased risk of bleeding.  Reviewed HAS BLED score and CHA?DS?-VASc score -     EKG 12-Lead  Atrial fibrillation, unspecified type (HCC) As above.  -     Ambulatory referral to Cardiology -     apixaban (ELIQUIS) 2.5 MG TABS tablet; Take 1 tablet (2.5 mg total) by mouth 2 (two) times daily.  Continue all other maintenance medications.  CHA?DS?-VASc Score for Atrial Fibrillation Stroke Risk from [email or web address]  on 02/26/2023 ** All calculations should be rechecked by clinician prior to use **  RESULT SUMMARY: 7 points Stroke risk was 11.2% per year in >90,000 patients (the [place] Atrial Fibrillation Cohort Study) and 15.7% risk of stroke/TIA/systemic embolism.  One recommendation suggests a 0 score for men or 1 score for women (no clinical risk factors)  is "low" risk and may not require anticoagulation; a 1 score for men or 2 score for women is "low-moderate" risk and should consider anticoagulation; and a score >=2 for men or >=3 for women is "moderate-high" risk and should otherwise be an anticoagulation candidate.   INPUTS: Age --> 2 =  >=75 Sex --> 0 = Male CHF history --> 0 = No Hypertension history --> 1 = Yes Stroke/TIA/thromboembolism history --> 2 = Yes Vascular disease history (prior MI, peripheral artery disease, or aortic plaque) --> 1 = Yes Diabetes history --> 1 = Yes  HAS-BLED Score for Major Bleeding Risk from [email or web address]  on 02/26/2023 ** All calculations should be rechecked by clinician prior to use **  RESULT SUMMARY: 4 points Risk was 8.9% in one validation study (Lip 2011) and 8.70 bleeds per 100 patient-years in another validation study (Pisters 2010).  Alternatives to anticoagulation should be considered: Patient is at high risk for major bleeding.  INPUTS: Hypertension --> 0 = No Renal disease --> 1 = Yes Liver disease --> 0 = No Stroke history --> 1 = Yes Prior major bleeding or predisposition to bleeding --> 0 = No Labile INR --> 0 = No Age >65 --> 1 = Yes Medication usage predisposing to bleeding --> 1 = Yes Alcohol use --> 0 = No   Follow up plan: Return if symptoms worsen or fail to improve.  Continue healthy lifestyle choices, including diet (rich in fruits, vegetables, and lean proteins, and low in salt and simple carbohydrates) and exercise (at least 30 minutes of moderate physical activity daily).  Written and verbal instructions provided   The above assessment and management plan was discussed with the patient. The patient verbalized understanding of and has agreed to the management plan. Patient is aware to call the clinic if they develop any new symptoms or if symptoms persist or worsen. Patient is aware when to return to the clinic for a follow-up visit. Patient educated on when it is appropriate to go to the emergency department.   Neale Burly, DNP-FNP [facility] [address] [place], [place] [address] [phone]

## 2023-02-26 NOTE — Patient Instructions (Addendum)
STOP CELEBREX  It appears that you have a viral upper respiratory infection (cold).  Cold symptoms can last up to 2 weeks.  I recommend that you only use cold medications that are safe in high blood pressure like Coricidin (generic is fine).  Other cold medications can increase your blood pressure.    - Get plenty of rest and drink plenty of fluids. - Try to breathe moist air. Use a cold mist humidifier. - Consume warm fluids (soup or tea) to provide relief for a stuffy nose and to loosen phlegm. - For nasal stuffiness, try saline nasal spray or a Neti Pot.  Afrin nasal spray can also be used but this product should not be used longer than 3 days or it will cause rebound nasal stuffiness (worsening nasal congestion). - For sore throat pain relief: use chloraseptic spray, suck on throat lozenges, hard candy or popsicles; gargle with warm salt water (1/4 tsp. salt per 8 oz. of water); and eat soft, bland foods. - Eat a well-balanced diet. If you cannot, ensure you are getting enough nutrients by taking a daily multivitamin. - Avoid dairy products, as they can thicken phlegm. - Avoid alcohol, as it impairs your body's immune system.  CONTACT YOUR DOCTOR IF YOU EXPERIENCE ANY OF THE FOLLOWING: - High fever - Ear pain - Sinus-type headache - Unusually severe cold symptoms - Cough that gets worse while other cold symptoms improve - Flare up of any chronic lung problem, such as asthma - Your symptoms persist longer than 2 weeks

## 2023-02-27 LAB — COVID-19, FLU A+B AND RSV
Influenza A, NAA: NOT DETECTED
Influenza B, NAA: NOT DETECTED
RSV, NAA: NOT DETECTED
SARS-CoV-2, NAA: DETECTED — AB

## 2023-03-02 NOTE — Progress Notes (Signed)
Positive for Covid. Given that his symptoms started the week before, he is technically outside of the treatment window. Recommend increasing fluids, humidifier, and Coricidin HBP due to Hypertension.

## 2023-03-05 NOTE — Progress Notes (Signed)
History of Present Illness: Patrick Moss is here today for continued follow-up of prostate cancer, following curative therapy.  6.13.2023: He underwent ultrasound and biopsy of the prostate.  PSA 13.2, prostate volume 105 mL, PSA density 0.13.  Biopsy results as follows:    9/12 cores positive-- 1 core  revealed GG 1 pattern 1 core revealed GG 2 pattern 7 cores revealed GG 3 pattern  Perineural invasion was found in 4 of the cores.  PET CT scan was performed on 6.22.2023.  This revealed no evidence of extra prostatic disease.   8.22.2023: ST ADT commenced-the patient received Firmagon 240 mg.   9.5.2023: Underwent placement of fiducial markers and SpaceOAR.  9.26.2023: Received a 32-month leuprolide injection.   10.2.2023 - 04/25/2022-received 28 fractions of EBRT  11.22.2023: Admitted for management of a CVA  3.26.2024: PSA <0.1  9.24.2024: PSA <0.1 overall doing well.  Had COVID earlier this month.  He does have slow urinary stream but this is not terribly bothersome.  He would not like to be on any medical therapy.  He is not experiencing blood in his urine or stool.  Past Medical History:  Diagnosis Date   Borderline glaucoma of both eyes    BPH associated with nocturia    CKD (chronic kidney disease), stage II    Complication of anesthesia    per pt emergent hiccups   Hyperlipidemia, mixed    Hypertension    Malignant neoplasm prostate (HCC) 11/2021   primary urologist---  dr dahlstadt/  radiation oncology [facility] center;  dx 06/ 2023,  Gleason 4+3,  PSA  13.2   OA (osteoarthritis)    knees   Type 2 diabetes mellitus treated with insulin (HCC)    followed by pcp ( previous seen by endocriologist--- dr g. nida ,lov note in epic 02-19-2021);    (02-11-2022  pt stated checks blood sugar twice daily,  fasting sugar average 90s  to 120s)   Wears dentures    upper   Wears glasses     Past Surgical History:  Procedure Laterality Date   FEMUR IM NAIL Right  05/03/2021   @[facility] --  [facility]   FOOT FRACTURE SURGERY Right    2017;   ORIF , per pt plate under foot   GOLD SEED IMPLANT N/A 02/18/2022   Procedure: GOLD SEED IMPLANT;  Surgeon: Belva Agee, MD;  Location: [facility];  Service: Urology;  Laterality: N/A;  30 MINS   INGUINAL HERNIA REPAIR Bilateral 2007   approx   IR ANGIO INTRA EXTRACRAN SEL COM CAROTID INNOMINATE UNI R MOD SED  05/14/2022   IR ANGIO VERTEBRAL SEL VERTEBRAL UNI R MOD SED  05/14/2022   IR INTRAVSC STENT CERV CAROTID W/EMB-PROT MOD SED INCL ANGIO  05/14/2022   IR RADIOLOGIST EVAL & MGMT  06/03/2022   RADIOLOGY WITH ANESTHESIA N/A 05/14/2022   Procedure: R ICA stent;  Surgeon: Julieanne Cotton, MD;  [facility];  Service: Radiology;  Laterality: N/A;   SPACE OAR INSTILLATION N/A 02/18/2022   Procedure: SPACE OAR INSTILLATION;  Surgeon: Belva Agee, MD;  Location: [facility];  Service: Urology;  Laterality: N/A;    Home Medications:  Allergies as of 03/10/2023       Reactions   Septra [sulfamethoxazole-trimethoprim] Hives        Medication List        Accurate as of March 05, 2023 10:06 AM. If you have any questions, ask your nurse or doctor.  apixaban 2.5 MG Tabs tablet Commonly known as: ELIQUIS Take 1 tablet (2.5 mg total) by mouth 2 (two) times daily.   aspirin 81 MG chewable tablet Chew 1 tablet (81 mg total) by mouth daily.   B-D ULTRAFINE III SHORT PEN 31G X 8 MM Misc Generic drug: Insulin Pen Needle USE  1  PEN  NEEDLE AS DIRECTED   Basaglar KwikPen 100 UNIT/ML Inject 20 Units into the skin daily.   Blood Glucose Monitoring Suppl Devi 1 each by Does not apply route in the morning, at noon, and at bedtime. May substitute to any manufacturer covered by patient's insurance.   BLOOD GLUCOSE TEST STRIPS Strp 1 each by In Vitro route in the morning, at noon, and at bedtime. May substitute to any manufacturer covered by patient's insurance.    calcium carbonate 1500 (600 Ca) MG Tabs tablet Commonly known as: OSCAL Take 1,500 mg by mouth 2 (two) times daily with a meal.   celecoxib 400 MG capsule Commonly known as: CELEBREX Take 1 capsule (400 mg total) by mouth daily. With food   clopidogrel 75 MG tablet Commonly known as: PLAVIX Take 1 tablet (75 mg total) by mouth daily.   diclofenac Sodium 1 % Gel Commonly known as: Voltaren Apply 4 g topically 4 (four) times daily as needed. For the knee   fexofenadine 180 MG tablet Commonly known as: ALLEGRA Take 1 tablet (180 mg total) by mouth daily. For allergy symptoms   FreeStyle Libre 3 Reader Hardie Pulley USE AS DIRECTED   FreeStyle Libre 3 Sensor Misc 1 Units by Does not apply route every 2 (two) hours as needed.   lisinopril 20 MG tablet Commonly known as: ZESTRIL Take 1 tablet (20 mg total) by mouth daily.   metFORMIN 500 MG 24 hr tablet Commonly known as: GLUCOPHAGE-XR Take 2 tablets (1,000 mg total) by mouth daily with breakfast.   omega-3 fish oil 1000 MG Caps capsule Commonly known as: MAXEPA Take 1 capsule by mouth daily.   pantoprazole 40 MG tablet Commonly known as: Protonix Take 1 tablet (40 mg total) by mouth daily.   rosuvastatin 5 MG tablet Commonly known as: CRESTOR Take 1 tablet (5 mg total) by mouth daily.   Trulicity 1.5 MG/0.5ML Sopn Generic drug: Dulaglutide Inject 1.5 mg into the skin once a week. Saturday's   Vitamin D3 50 MCG (2000 UT) Tabs Take 1 tablet (50 mcg total) by mouth daily.        Allergies:  Allergies  Allergen Reactions   Septra [Sulfamethoxazole-Trimethoprim] Hives    Family History  Problem Relation Age of Onset   Diabetes Mother    Stroke Mother    Diabetes Father    Stroke Father    Diabetes Brother     Social History:  reports that he has never smoked. He has never used smokeless tobacco. He reports that he does not drink alcohol and does not use drugs.  ROS: A complete review of systems was performed.   All systems are negative except for pertinent findings as noted.  Physical Exam:  Vital signs in last 24 hours: There were no vitals taken for this visit. Constitutional:  Alert and oriented, No acute distress Cardiovascular: Regular rate  Respiratory: Normal respiratory effort Neurologic: Grossly intact, no focal deficits Psychiatric: Normal mood and affect  I have reviewed prior pt notes  I have reviewed urinalysis results--clear  I have independently reviewed prior imaging--prostate ultrasound  I have reviewed prior PSA and pathology results  Impression/Assessment:  1.  Grade group 3 prostate cancer, status post ST ADT/EBRT.  Doing well  2.  BPH with moderate symptomatology but not terribly bothersome  Plan:  I will have him come back in 6 months following PSA

## 2023-03-10 ENCOUNTER — Ambulatory Visit: Payer: Medicare HMO | Admitting: Urology

## 2023-03-10 ENCOUNTER — Encounter: Payer: Self-pay | Admitting: Urology

## 2023-03-10 VITALS — BP 120/67 | HR 84

## 2023-03-10 DIAGNOSIS — Z8546 Personal history of malignant neoplasm of prostate: Secondary | ICD-10-CM

## 2023-03-10 DIAGNOSIS — N401 Enlarged prostate with lower urinary tract symptoms: Secondary | ICD-10-CM

## 2023-03-10 LAB — URINALYSIS, ROUTINE W REFLEX MICROSCOPIC
Bilirubin, UA: NEGATIVE
Glucose, UA: NEGATIVE
Ketones, UA: NEGATIVE
Leukocytes,UA: NEGATIVE
Nitrite, UA: NEGATIVE
Protein,UA: NEGATIVE
RBC, UA: NEGATIVE
Specific Gravity, UA: 1.02 (ref 1.005–1.030)
Urobilinogen, Ur: 0.2 mg/dL (ref 0.2–1.0)
pH, UA: 6 (ref 5.0–7.5)

## 2023-03-17 ENCOUNTER — Ambulatory Visit: Payer: Self-pay | Admitting: *Deleted

## 2023-03-17 NOTE — Patient Outreach (Signed)
Care Coordination   03/17/2023 Name: Patrick Moss MRN: [id number] DOB: 1941/03/19   Care Coordination Outreach Attempts:  An unsuccessful telephone outreach was attempted for a scheduled appointment today.  Follow Up Plan:  Additional outreach attempts will be made to offer the patient care coordination information and services.   Encounter Outcome:  No Answer Attempts to reach patient x 2 Each time his phone would ring twice and afterwards a loud noise would be heard before the phone call disconnect/drop   Care Coordination Interventions:  No, not indicated     Azaylea Maves L. Noelle Penner, RN, BSN, CCM, Care Management Coordinator [phone]

## 2023-03-19 DIAGNOSIS — Z961 Presence of intraocular lens: Secondary | ICD-10-CM | POA: Diagnosis not present

## 2023-03-19 DIAGNOSIS — Z794 Long term (current) use of insulin: Secondary | ICD-10-CM | POA: Diagnosis not present

## 2023-03-19 DIAGNOSIS — H40023 Open angle with borderline findings, high risk, bilateral: Secondary | ICD-10-CM | POA: Diagnosis not present

## 2023-03-19 DIAGNOSIS — E119 Type 2 diabetes mellitus without complications: Secondary | ICD-10-CM | POA: Diagnosis not present

## 2023-03-19 LAB — HM DIABETES EYE EXAM

## 2023-03-23 ENCOUNTER — Telehealth: Payer: Self-pay | Admitting: Family Medicine

## 2023-03-23 DIAGNOSIS — I4891 Unspecified atrial fibrillation: Secondary | ICD-10-CM

## 2023-03-23 MED ORDER — APIXABAN 2.5 MG PO TABS: 2.5000 mg | ORAL_TABLET | Freq: Two times a day (BID) | ORAL | 1 refills | Status: DC

## 2023-03-23 NOTE — Telephone Encounter (Signed)
  Prescription Request  03/23/2023  Is this a "Controlled Substance" medicine? no  Have you seen your PCP in the last 2 weeks? Not sure  If YES, route message to pool  -  If NO, patient needs to be scheduled for appointment.  What is the name of the medication or equipment? Eliquis 2.5mg   Have you contacted your pharmacy to request a refill? no   Which pharmacy would you like this sent to? [facility]   Patient notified that their request is being sent to the clinical staff for review and that they should receive a response within 2 business days.    Pt seen GM last & she switched him to this. He usually sees [name]. He is almost out of this med. Please send in a refill.

## 2023-03-23 NOTE — Telephone Encounter (Signed)
LMOVM refill sent to pharmacy 

## 2023-04-02 ENCOUNTER — Telehealth: Payer: Self-pay | Admitting: *Deleted

## 2023-04-02 NOTE — Progress Notes (Signed)
Care Coordination Note  04/02/2023 Name: Patrick Moss MRN: [id number] DOB: [birth year]  [name] is a 82 y.o. year old male who is a primary care patient of [name], Broadus John, MD and is actively engaged with the care management team. I reached out to Reita Cliche L Arganbright by phone today to assist with re-scheduling a follow up visit with the RN Case Manager  Follow up plan: Unsuccessful telephone outreach attempt made.   [facility]  Care Coordination Care Guide  Direct Dial: [phone]

## 2023-04-07 NOTE — Progress Notes (Signed)
Care Coordination Note  04/07/2023 Name: ANZEL MATHIASON MRN: [id number] DOB: 1941/05/31  [name] is a 82 y.o. year old male who is a primary care patient of [name], Broadus John, MD and is actively engaged with the care management team. I reached out to Reita Cliche L Santino by phone today to assist with re-scheduling a follow up visit with the RN Case Manager  Follow up plan: Unsuccessful telephone outreach attempt made.We have been unable to make contact with the patient for follow up. The care management team is available to follow up with the patient after provider conversation with the patient regarding recommendation for care management engagement and subsequent re-referral to the care management team.   [facility] Coordination Care Guide  Direct Dial: [phone]

## 2023-04-10 ENCOUNTER — Ambulatory Visit: Payer: Self-pay | Admitting: *Deleted

## 2023-04-10 NOTE — Patient Outreach (Addendum)
Care Coordination   Case closure  Visit Note   04/10/2023 Name: TOR SAMMON MRN: [id number] DOB: 16-Jun-1941  [name] is a 82 y.o. year old male who sees [name], MD for primary care. I  received a message from CMA  What matters to the patients health and wellness today?  Case closure unable to reach to reschedule appointment    Goals Addressed             This Visit's Progress    COMPLETED: [facility] care coordination services, Diabetes, dental services+       Case closure unable to reach to reschedule appointment         SDOH assessments and interventions completed:  No     Care Coordination Interventions:  No, not indicated   Follow up plan: No further intervention required.   Encounter Outcome:  Patient Visit Completed   Cala Bradford L. Noelle Penner, RN, BSN, [facility]  VBCI Care Management Coordinator  [phone]  Fax: [phone]

## 2023-04-22 IMAGING — PT NM PET TUM IMG SKULL BASE T - THIGH
1 series · 1 of 1 positions shown · non-contrast
Comparison: None Available.

CLINICAL DATA: Prostate carcinoma with biochemical recurrence.

EXAM:
NUCLEAR MEDICINE PET SKULL BASE TO THIGH
TECHNIQUE: 8.9 mCi F18 Piflufolastat (Pylarify) was injected intravenously.
Full-ring PET imaging was performed from the skull base to thigh
after the radiotracer. CT data was obtained and used for attenuation
correction and anatomic localization.

[Series 1040: results mm oncology reading · 4.1mm · 0.90mm/px · 1 of 1 slices shown]
[im 1/1]
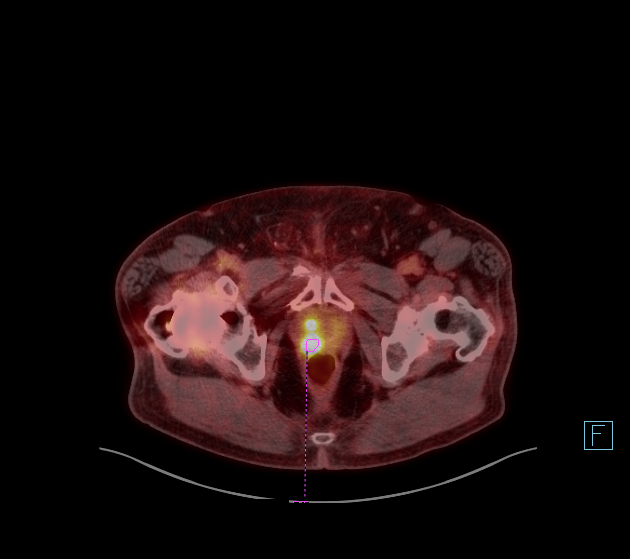

[1 of 1 positions shown; findings below may reference images not displayed]

FINDINGS: NECK

No radiotracer activity in neck lymph nodes.

Incidental CT finding: None

CHEST

No radiotracer accumulation within mediastinal or hilar lymph nodes.
No suspicious pulmonary nodules on the CT scan.

Incidental CT finding: Coronary artery calcification and aortic
atherosclerotic calcification.

ABDOMEN/PELVIS

Prostate: 2 foci of intense radiotracer activity in the RIGHT lobe
of the prostate gland with SUV max equal 18.2 in the larger lesion
(image 41).

Lymph nodes: No abnormal radiotracer accumulation within pelvic or
abdominal nodes.

Liver: No evidence of liver metastasis

Incidental CT finding: A small RIGHT scrotal hydrocele

SKELETON

No focal activity to suggest skeletal metastasis. Internal fixation
of the RIGHT hip
IMPRESSION: 1. Two foci of intense radiotracer activity in the RIGHT lobe of the
prostate gland consistent with primary prostate adenocarcinoma.
2. No evidence metastatic adenopathy in the pelvis or periaortic
retroperitoneum.
3. No evidence of visceral metastasis or skeletal metastasis.

## 2023-05-17 DIAGNOSIS — I482 Chronic atrial fibrillation, unspecified: Secondary | ICD-10-CM | POA: Insufficient documentation

## 2023-05-17 NOTE — Progress Notes (Unsigned)
Cardiology Office Note   Date:  05/20/2023   ID:  Patrick Moss 24-Jan-1941, MRN [id number]  PCP:  [name], MD  Cardiologist:   None Referring:  Arrie Senate*  Chief Complaint  Patient presents with   Palpitations      History of Present Illness: Patrick Moss is a 82 y.o. male who presents for presents for evaluation of arrhythmia.  He has no past cardiac history  Echo in 2023 demonstrated normal ejection fraction with no significant abnormalities.  He did have very mild aortic sclerosis.  He had a hypermobile septum which was not clinically significant.  Other than that he is not any cardiac history.  He had COVID in September and he said he still has a residual cough.  He really felt poorly with this.  He had an EKG that suggested possible atrial fibrillation although I reviewed this and looks like sinus rhythm with premature atrial contractions.  He has been on low-dose Eliquis since then.  He has not had any palpitations, presyncope or syncope.  He gets around with a cane after he had a broken leg some years ago.  He denies any chest discomfort, neck or arm discomfort.  He had no weight gain or edema.  He presents today with his identical twin and they look remarkably similar.   Past Medical History:  Diagnosis Date   Borderline glaucoma of both eyes    BPH associated with nocturia    CKD (chronic kidney disease), stage II    Complication of anesthesia    per pt emergent hiccups   Hyperlipidemia, mixed    Hypertension    Malignant neoplasm prostate (HCC) 11/2021   primary urologist---  dr dahlstadt/  radiation oncology [facility];  dx 06/ 2023,  Gleason 4+3,  PSA  13.2   OA (osteoarthritis)    knees   Type 2 diabetes mellitus treated with insulin (HCC)    followed by pcp ( previous seen by endocriologist--- dr g. nida ,lov note in epic 02-19-2021);    (02-11-2022  pt stated checks blood sugar twice daily,  fasting sugar  average 90s  to 120s)   Wears dentures    upper   Wears glasses     Past Surgical History:  Procedure Laterality Date   FEMUR IM NAIL Right 05/03/2021   @[facility] --  [facility]   FOOT FRACTURE SURGERY Right    2017;   ORIF , per pt plate under foot   GOLD SEED IMPLANT N/A 02/18/2022   Procedure: GOLD SEED IMPLANT;  Surgeon: Belva Agee, MD;  Location[facility];  Service: Urology;  Laterality: N/A;  30 MINS   INGUINAL HERNIA REPAIR Bilateral 2007   approx   IR ANGIO INTRA EXTRACRAN SEL COM CAROTID INNOMINATE UNI R MOD SED  05/14/2022   IR ANGIO VERTEBRAL SEL VERTEBRAL UNI R MOD SED  05/14/2022   IR INTRAVSC STENT CERV CAROTID W/EMB-PROT MOD SED INCL ANGIO  05/14/2022   IR RADIOLOGIST EVAL & MGMT  06/03/2022   RADIOLOGY WITH ANESTHESIA N/A 05/14/2022   Procedure: R ICA stent;  Surgeon: Julieanne Cotton, MD;  Location: [facility];  Service: Radiology;  Laterality: N/A;   SPACE OAR INSTILLATION N/A 02/18/2022   Procedure: SPACE OAR INSTILLATION;  Surgeon: Belva Agee, MD;  Location: [facility];  Service: Urology;  Laterality: N/A;     Current Outpatient Medications  Medication Sig Dispense Refill   aspirin 81 MG  chewable tablet Chew 1 tablet (81 mg total) by mouth daily. 30 tablet 11   calcium carbonate (OSCAL) 1500 (600 Ca) MG TABS tablet Take 1,500 mg by mouth 2 (two) times daily with a meal.     Cholecalciferol (VITAMIN D3) 50 MCG (2000 UT) TABS Take 1 tablet (50 mcg total) by mouth daily. 100 tablet 3   Dulaglutide (TRULICITY) 1.5 MG/0.5ML SOPN Inject 1.5 mg into the skin once a week. Saturday's 6 mL 3   Insulin Glargine (BASAGLAR KWIKPEN) 100 UNIT/ML Inject 20 Units into the skin daily. 45 mL 4   lisinopril (ZESTRIL) 20 MG tablet Take 1 tablet (20 mg total) by mouth daily. 90 tablet 3   metFORMIN (GLUCOPHAGE-XR) 500 MG 24 hr tablet Take 2 tablets (1,000 mg total) by mouth daily with breakfast. 180 tablet 03   omega-3 fish oil (MAXEPA) 1000 MG CAPS  capsule Take 1 capsule by mouth daily.     pantoprazole (PROTONIX) 40 MG tablet Take 1 tablet (40 mg total) by mouth daily. 90 tablet 3   rosuvastatin (CRESTOR) 5 MG tablet Take 1 tablet (5 mg total) by mouth daily. 90 tablet 3   apixaban (ELIQUIS) 2.5 MG TABS tablet Take 1 tablet (2.5 mg total) by mouth 2 (two) times daily. (Patient not taking: Reported on 05/20/2023) 60 tablet 1   B-D ULTRAFINE III SHORT PEN 31G X 8 MM MISC USE  1  PEN  NEEDLE AS DIRECTED 270 each 3   Blood Glucose Monitoring Suppl DEVI 1 each by Does not apply route in the morning, at noon, and at bedtime. May substitute to any manufacturer covered by patient's insurance. 1 each 0   celecoxib (CELEBREX) 400 MG capsule Take 1 capsule (400 mg total) by mouth daily. With food (Patient not taking: Reported on 05/20/2023) 90 capsule 3   Continuous Glucose Receiver (FREESTYLE LIBRE 3 READER) DEVI USE AS DIRECTED 1 each 3   Continuous Glucose Sensor (FREESTYLE LIBRE 3 SENSOR) MISC 1 Units by Does not apply route every 2 (two) hours as needed. 6 each 3   diclofenac Sodium (VOLTAREN) 1 % GEL Apply 4 g topically 4 (four) times daily as needed. For the knee (Patient not taking: Reported on 05/20/2023)     fexofenadine (ALLEGRA) 180 MG tablet Take 1 tablet (180 mg total) by mouth daily. For allergy symptoms (Patient not taking: Reported on 05/20/2023) 90 tablet 3   Glucose Blood (BLOOD GLUCOSE TEST STRIPS) STRP 1 each by In Vitro route in the morning, at noon, and at bedtime. May substitute to any manufacturer covered by patient's insurance. 100 strip 10   No current facility-administered medications for this visit.   Facility-Administered Medications Ordered in Other Visits  Medication Dose Route Frequency Provider Last Rate Last Admin   sodium phosphate (FLEET) 7-19 GM/118ML enema 1 enema  1 enema Rectal Once Belva Agee, MD        Allergies:   Septra [sulfamethoxazole-trimethoprim]    Social History:  The patient  reports that he  has never smoked. He has never used smokeless tobacco. He reports that he does not drink alcohol and does not use drugs.   Family History:  The patient's family history includes Diabetes in his brother, father, and mother; Stroke in his father and mother.    ROS:  Please see the history of present illness.   Otherwise, review of systems are positive for none.   All other systems are reviewed and negative.    PHYSICAL EXAM: VS:  BP (!) 160/70   Pulse 68   Ht 5\' 6"  (1.676 m)   Wt 171 lb (77.6 kg)   BMI 27.60 kg/m  , BMI Body mass index is 27.6 kg/m. GENERAL:  Well appearing HEENT:  Pupils equal round and reactive, fundi not visualized, oral mucosa unremarkable NECK:  No jugular venous distention, waveform within normal limits, carotid upstroke brisk and symmetric, no bruits, no thyromegaly LYMPHATICS:  No cervical, inguinal adenopathy LUNGS:  Clear to auscultation bilaterally BACK:  No CVA tenderness CHEST:  Unremarkable HEART:  PMI not displaced or sustained,S1 and S2 within normal limits, no S3, no S4, no clicks, no rubs, no murmurs ABD:  Flat, positive bowel sounds normal in frequency in pitch, no bruits, no rebound, no guarding, no midline pulsatile mass, no hepatomegaly, no splenomegaly EXT:  2 plus pulses throughout, no edema, no cyanosis no clubbing SKIN:  No rashes no nodules NEURO:  Cranial nerves II through XII grossly intact, motor grossly intact throughout [facility]:  Cognitively intact, oriented to person place and time    EKG:  EKG Interpretation Date/Time:  Wednesday May 20 2023 10:27:44 EST Ventricular Rate:  77 PR Interval:    QRS Duration:  96 QT Interval:  384 QTC Calculation: 434 R Axis:   -53  Text Interpretation: Normal sinus rhythm Palpitations Left anterior fascicular block When compared with ECG of 10-May-2022 07:01, NO SIGNIFICANT CHANGE SINCE LAST TRACING YESTERDAY Confirmed by Rollene Rotunda ([id number]) on 05/20/2023 10:33:41 AM     Recent  Labs: 02/19/2023: ALT 16; BUN 14; Creatinine, Ser 1.22; Hemoglobin 13.1; Platelets 191; Potassium 4.6; Sodium 144    Lipid Panel    Component Value Date/Time   CHOL 122 02/19/2023 0922   TRIG 130 02/19/2023 0922   HDL 46 02/19/2023 0922   CHOLHDL 2.7 02/19/2023 0922   CHOLHDL 2.4 05/08/2022 0447   VLDL 29 05/08/2022 0447   LDLCALC 53 02/19/2023 0922      Wt Readings from Last 3 Encounters:  05/20/23 171 lb (77.6 kg)  02/26/23 168 lb (76.2 kg)  02/19/23 177 lb 12.8 oz (80.6 kg)      Other studies Reviewed: Additional studies/ records that were reviewed today include: Primary care records. Review of the above records demonstrates:  Please see elsewhere in the note.     ASSESSMENT AND PLAN:  Arrhythmia: I do not think this is atrial fibrillation.  I am going to have him wear a 3-day monitor.  If this does not definitively demonstrate fibrillation I would discontinue the Eliquis.  For now he is going to hold his aspirin.  If he stops the Eliquis he will need to resume his aspirin.  HTN:   His blood pressure is elevated but this is the first time I am seeing him and this is the first reading.  We can try to get him to get a blood pressure cuff and keep a diary.  He is seeing his primary provider tomorrow.  Perhaps he has blood pressure is again elevated tomorrow we should likely increase his lisinopril.    Current medicines are reviewed at length with the patient today.  The patient does not have concerns regarding medicines.  The following changes have been made:  no change  Labs/ tests ordered today include:   Orders Placed This Encounter  Procedures   LONG TERM MONITOR (3-14 DAYS)   EKG 12-Lead     Disposition:   FU with with me as needed after the monitor.     Signed,  Rollene Rotunda, MD  05/20/2023 11:07 AM    [facility]

## 2023-05-20 ENCOUNTER — Telehealth: Payer: Self-pay | Admitting: Family Medicine

## 2023-05-20 ENCOUNTER — Ambulatory Visit: Payer: Medicare HMO | Attending: Cardiology

## 2023-05-20 ENCOUNTER — Encounter: Payer: Self-pay | Admitting: Cardiology

## 2023-05-20 ENCOUNTER — Ambulatory Visit (INDEPENDENT_AMBULATORY_CARE_PROVIDER_SITE_OTHER): Payer: Medicare HMO | Admitting: Cardiology

## 2023-05-20 VITALS — BP 160/70 | HR 68 | Ht 66.0 in | Wt 171.0 lb

## 2023-05-20 DIAGNOSIS — I10 Essential (primary) hypertension: Secondary | ICD-10-CM

## 2023-05-20 DIAGNOSIS — I482 Chronic atrial fibrillation, unspecified: Secondary | ICD-10-CM

## 2023-05-20 DIAGNOSIS — E785 Hyperlipidemia, unspecified: Secondary | ICD-10-CM

## 2023-05-20 NOTE — Patient Instructions (Addendum)
Medication Instructions:  Please discontinue your Plavix and hold your aspirin. Continue all other medications as listed.  *If you need a refill on your cardiac medications before your next appointment, please call your pharmacy*   Testing/Procedures: ZIO XT- Long Term Monitor Instructions  Your physician has requested you wear a ZIO patch monitor for 14 days.  This is a single patch monitor. [employer] supplies one patch monitor per enrollment. Additional stickers are not available. Please do not apply patch if you will be having a Nuclear Stress Test,  Echocardiogram, Cardiac CT, MRI, or Chest Xray during the period you would be wearing the  monitor. The patch cannot be worn during these tests. You cannot remove and re-apply the  ZIO XT patch monitor.  Your ZIO patch monitor will be mailed 3 day USPS to your address on file. It may take 3-5 days  to receive your monitor after you have been enrolled.  Once you have received your monitor, please review the enclosed instructions. Your monitor  has already been registered assigning a specific monitor serial # to you.  Billing and Patient Assistance Program Information  We have supplied [employer] with any of your insurance information on file for billing purposes. [employer] offers a sliding scale Patient Assistance Program for patients that do not have  insurance, or whose insurance does not completely cover the cost of the ZIO monitor.  You must apply for the Patient Assistance Program to qualify for this discounted rate.  To apply, please call [employer] at [phone], select option 4, select option 2, ask to apply for  Patient Assistance Program. Meredeth Ide will ask your household income, and how many people  are in your household. They will quote your out-of-pocket cost based on that information.  [employer] will also be able to set up a 46-month, interest-free payment plan if needed.  Applying the monitor   Shave hair from upper left chest.   Hold abrader disc by orange tab. Rub abrader in 40 strokes over the upper left chest as  indicated in your monitor instructions.  Clean area with 4 enclosed alcohol pads. Let dry.  Apply patch as indicated in monitor instructions. Patch will be placed under collarbone on left  side of chest with arrow pointing upward.  Rub patch adhesive wings for 2 minutes. Remove white label marked "1". Remove the white  label marked "2". Rub patch adhesive wings for 2 additional minutes.  While looking in a mirror, press and release button in center of patch. A small green light will  flash 3-4 times. This will be your only indicator that the monitor has been turned on.  Do not shower for the first 24 hours. You may shower after the first 24 hours.  Press the button if you feel a symptom. You will hear a small click. Record Date, Time and  Symptom in the Patient Logbook.  When you are ready to remove the patch, follow instructions on the last 2 pages of Patient  Logbook. Stick patch monitor onto the last page of Patient Logbook.  Place Patient Logbook in the blue and white box. Use locking tab on box and tape box closed  securely. The blue and white box has prepaid postage on it. Please place it in the mailbox as  soon as possible. Your physician should have your test results approximately 7 days after the  monitor has been mailed back to [facility].  Call [facility] Customer Care at [phone] if you have questions regarding  your  ZIO XT patch monitor. Call them immediately if you see an orange light blinking on your  monitor.  If your monitor falls off in less than 4 days, contact our Monitor department at [phone].  If your monitor becomes loose or falls off after 4 days call [employer] at [phone] for  suggestions on securing your monitor   Follow-Up: At [facility], you and your health needs are our priority.  As part of our continuing mission to provide you with  exceptional heart care, we have created designated Provider Care Teams.  These Care Teams include your primary Cardiologist (physician) and Advanced Practice Providers (APPs -  Physician Assistants and Nurse Practitioners) who all work together to provide you with the care you need, when you need it.  We recommend signing up for the patient portal called "MyChart".  Sign up information is provided on this After Visit Summary.  MyChart is used to connect with patients for Virtual Visits (Telemedicine).  Patients are able to view lab/test results, encounter notes, upcoming appointments, etc.  Non-urgent messages can be sent to your provider as well.   To learn more about what you can do with MyChart, go to [email or web address].    Your next appointment:   Follow up will be based on the results of the above testing.

## 2023-05-20 NOTE — Telephone Encounter (Signed)
Patient needs update on status of TRULICITY Rx that he gets through [employer] patient assistance. Has not heard anything and is almost out.

## 2023-05-20 NOTE — Progress Notes (Unsigned)
Enrolled patient for 3 day Zio XT monitor to be mailed to patients home

## 2023-05-21 ENCOUNTER — Encounter: Payer: Self-pay | Admitting: Family Medicine

## 2023-05-21 ENCOUNTER — Ambulatory Visit (INDEPENDENT_AMBULATORY_CARE_PROVIDER_SITE_OTHER): Payer: Medicare HMO | Admitting: Family Medicine

## 2023-05-21 VITALS — BP 156/71 | HR 62 | Temp 97.2°F | Ht 66.0 in | Wt 171.6 lb

## 2023-05-21 DIAGNOSIS — E119 Type 2 diabetes mellitus without complications: Secondary | ICD-10-CM | POA: Diagnosis not present

## 2023-05-21 DIAGNOSIS — E1159 Type 2 diabetes mellitus with other circulatory complications: Secondary | ICD-10-CM

## 2023-05-21 DIAGNOSIS — I152 Hypertension secondary to endocrine disorders: Secondary | ICD-10-CM

## 2023-05-21 DIAGNOSIS — N1831 Chronic kidney disease, stage 3a: Secondary | ICD-10-CM | POA: Diagnosis not present

## 2023-05-21 DIAGNOSIS — Z794 Long term (current) use of insulin: Secondary | ICD-10-CM

## 2023-05-21 DIAGNOSIS — E782 Mixed hyperlipidemia: Secondary | ICD-10-CM

## 2023-05-21 DIAGNOSIS — E1122 Type 2 diabetes mellitus with diabetic chronic kidney disease: Secondary | ICD-10-CM | POA: Diagnosis not present

## 2023-05-21 LAB — BAYER DCA HB A1C WAIVED: HB A1C (BAYER DCA - WAIVED): 5.4 % (ref 4.8–5.6)

## 2023-05-21 NOTE — Progress Notes (Signed)
Subjective:  Patient ID: Patrick Moss,  male    DOB: 09-18-1940  Age: 82 y.o.    CC: Medical Management of Chronic Issues and Diabetes   HPI Gurnie L Faidley presents for  follow-up of hypertension. Patient has no history of headache chest pain or shortness of breath or recent cough. Patient also denies symptoms of TIA such as numbness weakness lateralizing. Patient denies side effects from medication. States taking it regularly. Got a BP monitor yesterday. Learning how to use it.   Patient also  in for follow-up of elevated cholesterol. Doing well without complaints on current medication. Denies side effects  including myalgia and arthralgia and nausea. Also in today for liver function testing. Currently no chest pain, shortness of breath or other cardiovascular related symptoms noted.  Follow-up of diabetes. Patient does check blood sugar at home. Readings run between 110 fasting and 130 postprandial.  Patient denies symptoms such as excessive hunger or urinary frequency, excessive hunger, nausea No significant hypoglycemic spells noted. Medications reviewed. Pt reports taking them regularly. Pt. denies complication/adverse reaction today.    History Ronnell has a past medical history of Borderline glaucoma of both eyes, BPH associated with nocturia, CKD (chronic kidney disease), stage II, Complication of anesthesia, Hyperlipidemia, mixed, Hypertension, Malignant neoplasm prostate (HCC) (11/2021), OA (osteoarthritis), Type 2 diabetes mellitus treated with insulin (HCC), Wears dentures, and Wears glasses.   He has a past surgical history that includes Femur IM nail (Right, 05/03/2021); Foot fracture surgery (Right); Inguinal hernia repair (Bilateral, 2007); Gold seed implant (N/A, 02/18/2022); SPACE OAR INSTILLATION (N/A, 02/18/2022); Radiology with anesthesia (N/A, 05/14/2022); IR ANGIO INTRA EXTRACRAN SEL COM CAROTID INNOMINATE UNI R MOD SED (05/14/2022); IR INTRAVSC STENT CERV CAROTID  W/EMB-PROT MOD SED (05/14/2022); IR ANGIO VERTEBRAL SEL VERTEBRAL UNI R MOD SED (05/14/2022); and IR Radiologist Eval & Mgmt (06/03/2022).   His family history includes Diabetes in his brother, father, and mother; Stroke in his father and mother.He reports that he has never smoked. He has never used smokeless tobacco. He reports that he does not drink alcohol and does not use drugs.  Current Outpatient Medications on File Prior to Visit  Medication Sig Dispense Refill   apixaban (ELIQUIS) 2.5 MG TABS tablet Take 1 tablet (2.5 mg total) by mouth 2 (two) times daily. 60 tablet 1   B-D ULTRAFINE III SHORT PEN 31G X 8 MM MISC USE  1  PEN  NEEDLE AS DIRECTED 270 each 3   Blood Glucose Monitoring Suppl DEVI 1 each by Does not apply route in the morning, at noon, and at bedtime. May substitute to any manufacturer covered by patient's insurance. 1 each 0   calcium carbonate (OSCAL) 1500 (600 Ca) MG TABS tablet Take 1,500 mg by mouth 2 (two) times daily with a meal.     celecoxib (CELEBREX) 400 MG capsule Take 1 capsule (400 mg total) by mouth daily. With food 90 capsule 3   Cholecalciferol (VITAMIN D3) 50 MCG (2000 UT) TABS Take 1 tablet (50 mcg total) by mouth daily. 100 tablet 3   Continuous Glucose Receiver (FREESTYLE LIBRE 3 READER) DEVI USE AS DIRECTED 1 each 3   Continuous Glucose Sensor (FREESTYLE LIBRE 3 SENSOR) MISC 1 Units by Does not apply route every 2 (two) hours as needed. 6 each 3   diclofenac Sodium (VOLTAREN) 1 % GEL Apply 4 g topically 4 (four) times daily as needed. For the knee     Dulaglutide (TRULICITY) 1.5 MG/0.5ML SOPN Inject 1.5 mg into  the skin once a week. Saturday's 6 mL 3   Glucose Blood (BLOOD GLUCOSE TEST STRIPS) STRP 1 each by In Vitro route in the morning, at noon, and at bedtime. May substitute to any manufacturer covered by patient's insurance. 100 strip 10   Insulin Glargine (BASAGLAR KWIKPEN) 100 UNIT/ML Inject 20 Units into the skin daily. 45 mL 4   lisinopril  (ZESTRIL) 20 MG tablet Take 1 tablet (20 mg total) by mouth daily. 90 tablet 3   metFORMIN (GLUCOPHAGE-XR) 500 MG 24 hr tablet Take 2 tablets (1,000 mg total) by mouth daily with breakfast. 180 tablet 03   omega-3 fish oil (MAXEPA) 1000 MG CAPS capsule Take 1 capsule by mouth daily.     pantoprazole (PROTONIX) 40 MG tablet Take 1 tablet (40 mg total) by mouth daily. 90 tablet 3   rosuvastatin (CRESTOR) 5 MG tablet Take 1 tablet (5 mg total) by mouth daily. 90 tablet 3   aspirin 81 MG chewable tablet Chew 1 tablet (81 mg total) by mouth daily. (Patient not taking: Reported on 05/21/2023) 30 tablet 11   fexofenadine (ALLEGRA) 180 MG tablet Take 1 tablet (180 mg total) by mouth daily. For allergy symptoms (Patient not taking: Reported on 05/21/2023) 90 tablet 3   Current Facility-Administered Medications on File Prior to Visit  Medication Dose Route Frequency Provider Last Rate Last Admin   sodium phosphate (FLEET) 7-19 GM/118ML enema 1 enema  1 enema Rectal Once Belva Agee, MD        ROS Review of Systems  Constitutional:  Negative for fever.  Respiratory:  Negative for shortness of breath.   Cardiovascular:  Negative for chest pain.  Musculoskeletal:  Negative for arthralgias.  Skin:  Negative for rash.    Objective:  BP (!) 156/71   Pulse 62   Temp (!) 97.2 F (36.2 C)   Ht 5\' 6"  (1.676 m)   Wt 171 lb 9.6 oz (77.8 kg)   SpO2 98%   BMI 27.70 kg/m   BP Readings from Last 3 Encounters:  05/21/23 (!) 156/71  05/20/23 (!) 160/70  03/10/23 120/67    Wt Readings from Last 3 Encounters:  05/21/23 171 lb 9.6 oz (77.8 kg)  05/20/23 171 lb (77.6 kg)  02/26/23 168 lb (76.2 kg)     Physical Exam Vitals reviewed.  Constitutional:      Appearance: He is well-developed.  HENT:     Head: Normocephalic and atraumatic.     Right Ear: External ear normal.     Left Ear: External ear normal.     Mouth/Throat:     Pharynx: No oropharyngeal exudate or posterior oropharyngeal  erythema.  Eyes:     Pupils: Pupils are equal, round, and reactive to light.  Cardiovascular:     Rate and Rhythm: Normal rate and regular rhythm.     Heart sounds: No murmur heard. Pulmonary:     Effort: No respiratory distress.     Breath sounds: Normal breath sounds.  Musculoskeletal:     Cervical back: Normal range of motion and neck supple.  Neurological:     Mental Status: He is alert and oriented to person, place, and time.     Diabetic Foot Exam - Simple   No data filed     Lab Results  Component Value Date   HGBA1C 6.5 (H) 02/19/2023   HGBA1C 6.1 (H) 11/17/2022   HGBA1C 6.4 (H) 08/04/2022    Assessment & Plan:   Agamveer was seen today for  medical management of chronic issues and diabetes.  Diagnoses and all orders for this visit:  Hypertension associated with diabetes (HCC) -     CBC with Differential/Platelet -     CMP14+EGFR  Type 2 diabetes mellitus without complication, with long-term current use of insulin (HCC) -     Bayer DCA Hb A1c Waived -     Vitamin B12  Mixed hyperlipidemia -     Lipid panel  Stage 3a chronic kidney disease (CKD) (HCC) -     VITAMIN D 25 Hydroxy (Vit-D Deficiency, Fractures)   I am having Nate L. Eidson maintain his B-D ULTRAFINE III SHORT PEN, omega-3 fish oil, calcium carbonate, diclofenac Sodium, aspirin, metFORMIN, pantoprazole, Basaglar KwikPen, Trulicity, FreeStyle Libre 3 Sensor, rosuvastatin, celecoxib, Blood Glucose Monitoring Suppl, Vitamin D3, lisinopril, BLOOD GLUCOSE TEST STRIPS, fexofenadine, FreeStyle Libre 3 Reader, and apixaban.  No orders of the defined types were placed in this encounter.    Follow-up: Return in about 3 months (around 08/19/2023) for diabetes.  [name], M.D.

## 2023-05-21 NOTE — Telephone Encounter (Signed)
Left message for patient to call [phone] ([facility]) to follow up on his shipment status. Also informed him a renewal application will be mailed to his home.

## 2023-05-22 LAB — CBC WITH DIFFERENTIAL/PLATELET
Basophils Absolute: 0 10*3/uL (ref 0.0–0.2)
Basos: 0 %
EOS (ABSOLUTE): 0.1 10*3/uL (ref 0.0–0.4)
Eos: 1 %
Hematocrit: 41.4 % (ref 37.5–51.0)
Hemoglobin: 13.5 g/dL (ref 13.0–17.7)
Immature Grans (Abs): 0 10*3/uL (ref 0.0–0.1)
Immature Granulocytes: 0 %
Lymphocytes Absolute: 1.7 10*3/uL (ref 0.7–3.1)
Lymphs: 30 %
MCH: 30.8 pg (ref 26.6–33.0)
MCHC: 32.6 g/dL (ref 31.5–35.7)
MCV: 95 fL (ref 79–97)
Monocytes Absolute: 0.4 10*3/uL (ref 0.1–0.9)
Monocytes: 7 %
Neutrophils Absolute: 3.6 10*3/uL (ref 1.4–7.0)
Neutrophils: 62 %
Platelets: 247 10*3/uL (ref 150–450)
RBC: 4.38 x10E6/uL (ref 4.14–5.80)
RDW: 13.4 % (ref 11.6–15.4)
WBC: 5.9 10*3/uL (ref 3.4–10.8)

## 2023-05-22 LAB — LIPID PANEL
Chol/HDL Ratio: 2.2 {ratio} (ref 0.0–5.0)
Cholesterol, Total: 106 mg/dL (ref 100–199)
HDL: 49 mg/dL (ref >39)
LDL Chol Calc (NIH): 42 mg/dL (ref 0–99)
Triglycerides: 73 mg/dL (ref 0–149)
VLDL Cholesterol Cal: 15 mg/dL (ref 5–40)

## 2023-05-22 LAB — CMP14+EGFR
ALT: 14 [IU]/L (ref 0–44)
AST: 16 [IU]/L (ref 0–40)
Albumin: 4.2 g/dL (ref 3.7–4.7)
Alkaline Phosphatase: 101 [IU]/L (ref 44–121)
BUN/Creatinine Ratio: 9 — ABNORMAL LOW (ref 10–24)
BUN: 10 mg/dL (ref 8–27)
Bilirubin Total: 0.5 mg/dL (ref 0.0–1.2)
CO2: 23 mmol/L (ref 20–29)
Calcium: 9.9 mg/dL (ref 8.6–10.2)
Chloride: 105 mmol/L (ref 96–106)
Creatinine, Ser: 1.12 mg/dL (ref 0.76–1.27)
Globulin, Total: 2.4 g/dL (ref 1.5–4.5)
Glucose: 92 mg/dL (ref 70–99)
Potassium: 4.7 mmol/L (ref 3.5–5.2)
Sodium: 143 mmol/L (ref 134–144)
Total Protein: 6.6 g/dL (ref 6.0–8.5)
eGFR: 66 mL/min/{1.73_m2} (ref >59)

## 2023-05-22 LAB — VITAMIN B12: Vitamin B-12: 145 pg/mL — ABNORMAL LOW (ref 232–1245)

## 2023-05-22 LAB — VITAMIN D 25 HYDROXY (VIT D DEFICIENCY, FRACTURES): Vit D, 25-Hydroxy: 54.4 ng/mL (ref 30.0–100.0)

## 2023-05-24 ENCOUNTER — Other Ambulatory Visit: Payer: Self-pay | Admitting: Family Medicine

## 2023-05-24 DIAGNOSIS — I482 Chronic atrial fibrillation, unspecified: Secondary | ICD-10-CM

## 2023-05-24 DIAGNOSIS — I4891 Unspecified atrial fibrillation: Secondary | ICD-10-CM

## 2023-05-24 NOTE — Progress Notes (Signed)
Vitamin B12 is low. Needs to get injections, one ml weekly for a month then one ml monthly.

## 2023-05-25 ENCOUNTER — Telehealth: Payer: Self-pay | Admitting: Family Medicine

## 2023-05-25 ENCOUNTER — Other Ambulatory Visit: Payer: Self-pay | Admitting: Family Medicine

## 2023-05-25 ENCOUNTER — Telehealth: Payer: Self-pay

## 2023-05-25 ENCOUNTER — Other Ambulatory Visit: Payer: Self-pay | Admitting: *Deleted

## 2023-05-25 DIAGNOSIS — I4891 Unspecified atrial fibrillation: Secondary | ICD-10-CM

## 2023-05-25 DIAGNOSIS — E538 Deficiency of other specified B group vitamins: Secondary | ICD-10-CM

## 2023-05-25 MED ORDER — CYANOCOBALAMIN 1000 MCG/ML IJ SOLN
1000.0000 ug | Freq: Once | INTRAMUSCULAR | Status: AC
  Administered 2023-06-02: 1000 ug via INTRAMUSCULAR

## 2023-05-25 MED ORDER — CYANOCOBALAMIN 1000 MCG/ML IJ SOLN
1000.0000 ug | INTRAMUSCULAR | Status: AC
  Administered 2023-06-18 – 2024-06-01 (×12): 1000 ug via INTRAMUSCULAR

## 2023-05-25 NOTE — Telephone Encounter (Signed)
Duplicate message, also has result note

## 2023-05-25 NOTE — Telephone Encounter (Signed)
Called patient again, no answer

## 2023-05-25 NOTE — Telephone Encounter (Signed)
Copied from CRM [id number]. Topic: Clinical - Lab/Test Results >> May 25, 2023 12:52 PM Dennison Nancy wrote: Reason for CRM: patient received a call regarding his test results . Nurse will be calling patient back to go  over labs

## 2023-05-26 ENCOUNTER — Other Ambulatory Visit: Payer: Self-pay | Admitting: Family Medicine

## 2023-05-26 ENCOUNTER — Encounter: Payer: Self-pay | Admitting: *Deleted

## 2023-05-26 DIAGNOSIS — I4891 Unspecified atrial fibrillation: Secondary | ICD-10-CM

## 2023-05-27 DIAGNOSIS — C61 Malignant neoplasm of prostate: Secondary | ICD-10-CM | POA: Diagnosis not present

## 2023-05-27 NOTE — Telephone Encounter (Signed)
Mailed to pt home.  Faxed pg 8 of application to pcp.

## 2023-06-02 ENCOUNTER — Other Ambulatory Visit: Payer: Self-pay | Admitting: Family Medicine

## 2023-06-02 ENCOUNTER — Ambulatory Visit (INDEPENDENT_AMBULATORY_CARE_PROVIDER_SITE_OTHER): Payer: Medicare HMO

## 2023-06-02 DIAGNOSIS — E538 Deficiency of other specified B group vitamins: Secondary | ICD-10-CM | POA: Diagnosis not present

## 2023-06-02 NOTE — Progress Notes (Signed)
Patient is in office today for a nurse visit for B12 Injection. Patient Injection was given in the  Left deltoid. Patient tolerated injection well.

## 2023-06-03 DIAGNOSIS — I482 Chronic atrial fibrillation, unspecified: Secondary | ICD-10-CM | POA: Diagnosis not present

## 2023-06-04 ENCOUNTER — Telehealth: Payer: Self-pay | Admitting: Pharmacist

## 2023-06-04 DIAGNOSIS — E119 Type 2 diabetes mellitus without complications: Secondary | ICD-10-CM

## 2023-06-04 MED ORDER — TRULICITY 1.5 MG/0.5ML ~~LOC~~ SOAJ: 1.5000 mg | SUBCUTANEOUS | 3 refills | Status: DC

## 2023-06-04 MED ORDER — BASAGLAR KWIKPEN 100 UNIT/ML ~~LOC~~ SOPN: 20.0000 [IU] | PEN_INJECTOR | Freq: Every day | SUBCUTANEOUS | 5 refills | Status: DC

## 2023-06-04 NOTE — Telephone Encounter (Signed)
   Patient enrolled in the [employer] patient assistance program for Basaglar/Trulicity.  Updated RXs escribed to [facility] specialty mail order (pharmacy for [place] patient assistance).  Patient is stable on current regimen.  Patient PAP portion faxed to CPhT.  Kieth Brightly, PharmD, BCACP, CPP Clinical Pharmacist, [facility]

## 2023-06-04 NOTE — Telephone Encounter (Addendum)
Rec'd pcp pages via fax 06/01/23. Awaiting pt pgs.

## 2023-06-05 NOTE — Progress Notes (Signed)
Pharmacy Medication Assistance Program Note    06/12/2023  Patient ID: Patrick Moss, male   DOB: 10/13/1940, 82 y.o.   MRN: [id number]     05/25/2023  Outreach Medication One  Manufacturer Medication One [employer] Basaglar  Dose of Basaglar u100 [employer]  Type of Radiographer, therapeutic Assistance  Date Application Sent to Patient 05/27/2023  Application Items Requested Application;Proof of Income  Date Application Sent to Prescriber 05/27/2023  Date Application Received From Patient 06/01/2023  Date Application Submitted to Manufacturer 06/05/2023  Method Application Sent to Manufacturer Fax  Patient Assistance Determination Approved  Approval Start Date 06/17/2023  Approval End Date 06/15/2024         05/25/2023  Outreach Medication Two  Manufacturer Medication Two [employer]  [facility] Trulicity  Dose of Trulicity 1.5mg   Type of Radiographer, therapeutic Assistance  Date Application Sent to Patient 05/27/2023  Application Items Requested Application  Date Application Sent to Prescriber 05/27/2023  Date Application Received From Patient 06/01/2023  Method Application Sent to Manufacturer Fax  Date Application Submitted to Manufacturer 06/05/2023  Patient Assistance Determination Approved  Approval Start Date 06/17/2023

## 2023-06-11 ENCOUNTER — Ambulatory Visit (INDEPENDENT_AMBULATORY_CARE_PROVIDER_SITE_OTHER): Payer: Medicare HMO | Admitting: *Deleted

## 2023-06-11 DIAGNOSIS — E538 Deficiency of other specified B group vitamins: Secondary | ICD-10-CM

## 2023-06-11 MED ORDER — CYANOCOBALAMIN 1000 MCG/ML IJ SOLN
1000.0000 ug | Freq: Once | INTRAMUSCULAR | Status: AC
  Administered 2023-06-11: 1000 ug via INTRAMUSCULAR

## 2023-06-11 NOTE — Progress Notes (Signed)
Pt given B12 injection IM right deltoid and tolerated well. 

## 2023-06-12 ENCOUNTER — Telehealth: Payer: Self-pay | Admitting: *Deleted

## 2023-06-12 NOTE — Telephone Encounter (Signed)
-----   Message from Rollene Rotunda sent at 06/09/2023 11:31 AM EST ----- No evidence of atrial fib.  OK to stop the Eliquis and continue ASA.  Call Mr. Kulas with the results and send results to [name], MD

## 2023-06-12 NOTE — Telephone Encounter (Signed)
Spoke with pt, aware of dr hochrein's recommendations. ?

## 2023-06-17 ENCOUNTER — Other Ambulatory Visit: Payer: Self-pay | Admitting: Family Medicine

## 2023-06-18 ENCOUNTER — Ambulatory Visit (INDEPENDENT_AMBULATORY_CARE_PROVIDER_SITE_OTHER): Payer: Medicare HMO | Admitting: *Deleted

## 2023-06-18 DIAGNOSIS — E538 Deficiency of other specified B group vitamins: Secondary | ICD-10-CM

## 2023-06-18 NOTE — Progress Notes (Signed)
B12 injection given left deltoid intramuscular. Patient tolerated well. 

## 2023-06-25 ENCOUNTER — Ambulatory Visit (INDEPENDENT_AMBULATORY_CARE_PROVIDER_SITE_OTHER): Payer: Medicare HMO

## 2023-06-25 DIAGNOSIS — E538 Deficiency of other specified B group vitamins: Secondary | ICD-10-CM

## 2023-06-25 NOTE — Progress Notes (Signed)
 Patient is in office today for a nurse visit for B12 Injection. Patient Injection was given in the  Right deltoid. Patient tolerated injection well.

## 2023-07-09 ENCOUNTER — Other Ambulatory Visit (HOSPITAL_COMMUNITY): Payer: Self-pay

## 2023-07-09 ENCOUNTER — Telehealth: Payer: Self-pay | Admitting: Pharmacy Technician

## 2023-07-09 NOTE — Telephone Encounter (Signed)
Pharmacy Patient Advocate Encounter   Received notification from CoverMyMeds that prior authorization for FreeStyle Libre 3 Sensor is required/requested.   Insurance verification completed.   The patient is insured through [employer] .   Per test claim: The current 84 day co-pay is, $76.84.  No PA needed at this time. This test claim was processed through [facility]- copay amounts may vary at other pharmacies due to pharmacy/plan contracts, or as the patient moves through the different stages of their insurance plan.

## 2023-07-29 ENCOUNTER — Ambulatory Visit (INDEPENDENT_AMBULATORY_CARE_PROVIDER_SITE_OTHER): Payer: Medicare HMO

## 2023-07-29 DIAGNOSIS — E538 Deficiency of other specified B group vitamins: Secondary | ICD-10-CM

## 2023-07-29 NOTE — Progress Notes (Signed)
Patient is in office today for a nurse visit for B12 Injection. Patient Injection was given in the  Left deltoid. Patient tolerated injection well.

## 2023-08-04 ENCOUNTER — Other Ambulatory Visit: Payer: Self-pay | Admitting: *Deleted

## 2023-08-04 MED ORDER — PANTOPRAZOLE SODIUM 40 MG PO TBEC: 40.0000 mg | DELAYED_RELEASE_TABLET | Freq: Every day | ORAL | 0 refills | Status: DC

## 2023-08-20 ENCOUNTER — Encounter: Payer: Self-pay | Admitting: Family Medicine

## 2023-08-20 ENCOUNTER — Ambulatory Visit (INDEPENDENT_AMBULATORY_CARE_PROVIDER_SITE_OTHER): Payer: Medicare HMO | Admitting: Family Medicine

## 2023-08-20 VITALS — BP 129/73 | HR 71 | Temp 97.4°F | Ht 66.0 in | Wt 169.0 lb

## 2023-08-20 DIAGNOSIS — E782 Mixed hyperlipidemia: Secondary | ICD-10-CM

## 2023-08-20 DIAGNOSIS — E1122 Type 2 diabetes mellitus with diabetic chronic kidney disease: Secondary | ICD-10-CM

## 2023-08-20 DIAGNOSIS — E1159 Type 2 diabetes mellitus with other circulatory complications: Secondary | ICD-10-CM

## 2023-08-20 DIAGNOSIS — E538 Deficiency of other specified B group vitamins: Secondary | ICD-10-CM | POA: Diagnosis not present

## 2023-08-20 DIAGNOSIS — C61 Malignant neoplasm of prostate: Secondary | ICD-10-CM | POA: Diagnosis not present

## 2023-08-20 DIAGNOSIS — Z794 Long term (current) use of insulin: Secondary | ICD-10-CM | POA: Diagnosis not present

## 2023-08-20 DIAGNOSIS — I152 Hypertension secondary to endocrine disorders: Secondary | ICD-10-CM | POA: Diagnosis not present

## 2023-08-20 DIAGNOSIS — E119 Type 2 diabetes mellitus without complications: Secondary | ICD-10-CM | POA: Diagnosis not present

## 2023-08-20 DIAGNOSIS — N1831 Chronic kidney disease, stage 3a: Secondary | ICD-10-CM | POA: Diagnosis not present

## 2023-08-20 DIAGNOSIS — I4891 Unspecified atrial fibrillation: Secondary | ICD-10-CM | POA: Diagnosis not present

## 2023-08-20 LAB — BAYER DCA HB A1C WAIVED: HB A1C (BAYER DCA - WAIVED): 6 % — ABNORMAL HIGH (ref 4.8–5.6)

## 2023-08-20 MED ORDER — ASPIRIN 81 MG PO CHEW: 81.0000 mg | CHEWABLE_TABLET | Freq: Every day | ORAL | 3 refills | Status: AC

## 2023-08-20 MED ORDER — METFORMIN HCL ER 500 MG PO TB24: 500.0000 mg | ORAL_TABLET | Freq: Every day | ORAL | 0 refills | Status: DC

## 2023-08-20 MED ORDER — PANTOPRAZOLE SODIUM 40 MG PO TBEC: 40.0000 mg | DELAYED_RELEASE_TABLET | Freq: Every day | ORAL | 0 refills | Status: DC

## 2023-08-20 MED ORDER — FEXOFENADINE HCL 180 MG PO TABS
180.0000 mg | ORAL_TABLET | Freq: Every day | ORAL | 3 refills | Status: AC
Start: 2023-08-20 — End: ?

## 2023-08-20 NOTE — Patient Instructions (Signed)
 Use cetaphil lotion to moisturize your skin daily

## 2023-08-20 NOTE — Progress Notes (Signed)
 Subjective:  Patient ID: Patrick Moss,  male    DOB: 09/15/1940  Age: 83 y.o.    CC: Medical Management of Chronic Issues   HPI Patrick Moss presents for  follow-up of hypertension. Patient has no history of headache chest pain or shortness of breath or recent cough. Patient also denies symptoms of TIA such as numbness weakness lateralizing. Patient denies side effects from medication. States taking it regularly.  Patient also  in for follow-up of elevated cholesterol. Doing well without complaints on current medication. Denies side effects  including myalgia and arthralgia and nausea. Also in today for liver function testing. Currently no chest pain, shortness of breath or other cardiovascular related symptoms noted.  Follow-up of diabetes. Patient does check blood sugar at home. Readings run between 100 and 130 Patient denies symptoms such as excessive hunger or urinary frequency, excessive hunger, nausea No significant hypoglycemic spells noted. Medications reviewed. Pt reports taking them regularly. Pt. denies complication/adverse reaction today.    History Patrick Moss has a past medical history of Borderline glaucoma of both eyes, BPH associated with nocturia, CKD (chronic kidney disease), stage II, Complication of anesthesia, Hyperlipidemia, mixed, Hypertension, Malignant neoplasm prostate (HCC) (11/2021), OA (osteoarthritis), Type 2 diabetes mellitus treated with insulin (HCC), Wears dentures, and Wears glasses.   He has a past surgical history that includes Femur IM nail (Right, 05/03/2021); Foot fracture surgery (Right); Inguinal hernia repair (Bilateral, 2007); Gold seed implant (N/A, 02/18/2022); SPACE OAR INSTILLATION (N/A, 02/18/2022); Radiology with anesthesia (N/A, 05/14/2022); IR ANGIO INTRA EXTRACRAN SEL COM CAROTID INNOMINATE UNI R MOD SED (05/14/2022); IR INTRAVSC STENT CERV CAROTID W/EMB-PROT MOD SED (05/14/2022); IR ANGIO VERTEBRAL SEL VERTEBRAL UNI R MOD SED (05/14/2022);  and IR Radiologist Eval & Mgmt (06/03/2022).   His family history includes Diabetes in his brother, father, and mother; Stroke in his father and mother.He reports that he has never smoked. He has never used smokeless tobacco. He reports that he does not drink alcohol and does not use drugs.  Current Outpatient Medications on File Prior to Visit  Medication Sig Dispense Refill   B-D ULTRAFINE III SHORT PEN 31G X 8 MM MISC USE  1  PEN  NEEDLE AS DIRECTED 270 each 3   Blood Glucose Monitoring Suppl DEVI 1 each by Does not apply route in the morning, at noon, and at bedtime. May substitute to any manufacturer covered by patient's insurance. 1 each 0   calcium carbonate (OSCAL) 1500 (600 Ca) MG TABS tablet Take 1,500 mg by mouth 2 (two) times daily with a meal.     Cholecalciferol (VITAMIN D3) 50 MCG (2000 UT) TABS Take 1 tablet (50 mcg total) by mouth daily. 100 tablet 3   Continuous Glucose Receiver (FREESTYLE LIBRE 3 READER) DEVI USE AS DIRECTED 1 each 3   Continuous Glucose Sensor (FREESTYLE LIBRE 3 SENSOR) MISC 1 Units by Does not apply route every 2 (two) hours as needed. 6 each 3   Dulaglutide (TRULICITY) 1.5 MG/0.5ML SOAJ Inject 1.5 mg into the skin once a week. Saturday's (Patient taking differently: Inject 1.5 mg into the skin once a week. Saturday's) 6 mL 3   Glucose Blood (BLOOD GLUCOSE TEST STRIPS) STRP 1 each by In Vitro route in the morning, at noon, and at bedtime. May substitute to any manufacturer covered by patient's insurance. 100 strip 10   Insulin Glargine (BASAGLAR KWIKPEN) 100 UNIT/ML Inject 20 Units into the skin daily. 45 mL 5   lisinopril (ZESTRIL) 20 MG tablet Take  1 tablet (20 mg total) by mouth daily. 90 tablet 3   omega-3 fish oil (MAXEPA) 1000 MG CAPS capsule Take 1 capsule by mouth daily.     rosuvastatin (CRESTOR) 5 MG tablet Take 1 tablet (5 mg total) by mouth daily. 90 tablet 3   Current Facility-Administered Medications on File Prior to Visit  Medication Dose Route  Frequency Provider Last Rate Last Admin   cyanocobalamin (VITAMIN B12) injection 1,000 mcg  1,000 mcg Intramuscular Q30 days [name], MD   1,000 mcg at 07/29/23 0956   sodium phosphate (FLEET) 7-19 GM/118ML enema 1 enema  1 enema Rectal Once Belva Agee, MD        ROS Review of Systems  Constitutional:  Negative for fever.  Respiratory:  Negative for shortness of breath.   Cardiovascular:  Negative for chest pain.  Musculoskeletal:  Negative for arthralgias.  Skin:  Negative for rash.  Neurological:  Positive for numbness (a little bit, occasionally for about 15 min going down the lower leg, on the left).    Objective:  BP 129/73   Pulse 71   Temp (!) 97.4 F (36.3 C)   Ht 5\' 6"  (1.676 m)   Wt 169 lb (76.7 kg)   SpO2 97%   BMI 27.28 kg/m   BP Readings from Last 3 Encounters:  08/20/23 129/73  05/21/23 (!) 156/71  05/20/23 (!) 160/70    Wt Readings from Last 3 Encounters:  08/20/23 169 lb (76.7 kg)  05/21/23 171 lb 9.6 oz (77.8 kg)  05/20/23 171 lb (77.6 kg)     Physical Exam Vitals reviewed.  Constitutional:      Appearance: He is well-developed.  HENT:     Head: Normocephalic and atraumatic.     Right Ear: External ear normal.     Left Ear: External ear normal.     Mouth/Throat:     Pharynx: No oropharyngeal exudate or posterior oropharyngeal erythema.  Eyes:     Pupils: Pupils are equal, round, and reactive to light.  Cardiovascular:     Rate and Rhythm: Normal rate and regular rhythm.     Heart sounds: No murmur heard. Pulmonary:     Effort: No respiratory distress.     Breath sounds: Normal breath sounds.  Musculoskeletal:     Cervical back: Normal range of motion and neck supple.  Neurological:     Mental Status: He is alert and oriented to person, place, and time.     Diabetic Foot Exam - Simple   Simple Foot Form Diabetic Foot exam was performed with the following findings: Yes 08/20/2023 10:00 AM  Visual Inspection No deformities,  no ulcerations, no other skin breakdown bilaterally: Yes Sensation Testing Intact to touch and monofilament testing bilaterally: Yes Pulse Check Posterior Tibialis and Dorsalis pulse intact bilaterally: Yes Comments     Lab Results  Component Value Date   HGBA1C 6.0 (H) 08/20/2023   HGBA1C 5.4 05/21/2023   HGBA1C 6.5 (H) 02/19/2023    Assessment & Plan:   Patrick Moss was seen today for medical management of chronic issues.  Diagnoses and all orders for this visit:  Type 2 diabetes mellitus without complication, with long-term current use of insulin (HCC) -     Bayer DCA Hb A1c Waived  B12 deficiency -     Vitamin B12  Hypertension associated with diabetes (HCC) -     CBC with Differential/Platelet -     CMP14+EGFR  Mixed hyperlipidemia -     Lipid  panel  Stage 3a chronic kidney disease (CKD) (HCC) -     Bayer DCA Hb A1c Waived -     CMP14+EGFR  Atrial fibrillation, unspecified type (HCC) -     CBC with Differential/Platelet -     CMP14+EGFR  Other orders -     metFORMIN (GLUCOPHAGE-XR) 500 MG 24 hr tablet; Take 1 tablet (500 mg total) by mouth daily with breakfast. -     pantoprazole (PROTONIX) 40 MG tablet; Take 1 tablet (40 mg total) by mouth daily. -     aspirin 81 MG chewable tablet; Chew 1 tablet (81 mg total) by mouth daily. -     fexofenadine (ALLEGRA) 180 MG tablet; Take 1 tablet (180 mg total) by mouth daily. For allergy symptoms   I have discontinued Norvin L. Shuffler's diclofenac Sodium and celecoxib. I have also changed his metFORMIN. Additionally, I am having him maintain his B-D ULTRAFINE III SHORT PEN, omega-3 fish oil, calcium carbonate, FreeStyle Libre 3 Sensor, rosuvastatin, Blood Glucose Monitoring Suppl, Vitamin D3, lisinopril, BLOOD GLUCOSE TEST STRIPS, FreeStyle Libre 3 Reader, Basaglar KwikPen, Trulicity, pantoprazole, aspirin, and fexofenadine. We will continue to administer cyanocobalamin.  Meds ordered this encounter  Medications   metFORMIN  (GLUCOPHAGE-XR) 500 MG 24 hr tablet    Sig: Take 1 tablet (500 mg total) by mouth daily with breakfast.    Dispense:  180 tablet    Refill:  0   pantoprazole (PROTONIX) 40 MG tablet    Sig: Take 1 tablet (40 mg total) by mouth daily.    Dispense:  90 tablet    Refill:  0   aspirin 81 MG chewable tablet    Sig: Chew 1 tablet (81 mg total) by mouth daily.    Dispense:  90 each    Refill:  3   fexofenadine (ALLEGRA) 180 MG tablet    Sig: Take 1 tablet (180 mg total) by mouth daily. For allergy symptoms    Dispense:  90 tablet    Refill:  3     Follow-up: Return in about 3 months (around 11/20/2023).  [name], M.D.

## 2023-08-21 ENCOUNTER — Encounter: Payer: Self-pay | Admitting: Urology

## 2023-08-21 LAB — LIPID PANEL
Chol/HDL Ratio: 2.5 ratio (ref 0.0–5.0)
Cholesterol, Total: 119 mg/dL (ref 100–199)
HDL: 48 mg/dL (ref >39)
LDL Chol Calc (NIH): 54 mg/dL (ref 0–99)
Triglycerides: 85 mg/dL (ref 0–149)
VLDL Cholesterol Cal: 17 mg/dL (ref 5–40)

## 2023-08-21 LAB — CMP14+EGFR
ALT: 13 IU/L (ref 0–44)
AST: 16 IU/L (ref 0–40)
Albumin: 4.4 g/dL (ref 3.7–4.7)
Alkaline Phosphatase: 106 IU/L (ref 44–121)
BUN/Creatinine Ratio: 13 (ref 10–24)
BUN: 17 mg/dL (ref 8–27)
Bilirubin Total: 0.5 mg/dL (ref 0.0–1.2)
CO2: 23 mmol/L (ref 20–29)
Calcium: 10.5 mg/dL — ABNORMAL HIGH (ref 8.6–10.2)
Chloride: 104 mmol/L (ref 96–106)
Creatinine, Ser: 1.27 mg/dL (ref 0.76–1.27)
Globulin, Total: 2.4 g/dL (ref 1.5–4.5)
Glucose: 89 mg/dL (ref 70–99)
Potassium: 4.8 mmol/L (ref 3.5–5.2)
Sodium: 143 mmol/L (ref 134–144)
Total Protein: 6.8 g/dL (ref 6.0–8.5)
eGFR: 57 mL/min/{1.73_m2} — ABNORMAL LOW (ref >59)

## 2023-08-21 LAB — CBC WITH DIFFERENTIAL/PLATELET
Basophils Absolute: 0 10*3/uL (ref 0.0–0.2)
Basos: 1 %
EOS (ABSOLUTE): 0.2 10*3/uL (ref 0.0–0.4)
Eos: 3 %
Hematocrit: 45.1 % (ref 37.5–51.0)
Hemoglobin: 14.8 g/dL (ref 13.0–17.7)
Immature Grans (Abs): 0.1 10*3/uL (ref 0.0–0.1)
Immature Granulocytes: 1 %
Lymphocytes Absolute: 2 10*3/uL (ref 0.7–3.1)
Lymphs: 32 %
MCH: 29.8 pg (ref 26.6–33.0)
MCHC: 32.8 g/dL (ref 31.5–35.7)
MCV: 91 fL (ref 79–97)
Monocytes Absolute: 0.4 10*3/uL (ref 0.1–0.9)
Monocytes: 6 %
Neutrophils Absolute: 3.8 10*3/uL (ref 1.4–7.0)
Neutrophils: 57 %
Platelets: 253 10*3/uL (ref 150–450)
RBC: 4.96 x10E6/uL (ref 4.14–5.80)
RDW: 13 % (ref 11.6–15.4)
WBC: 6.5 10*3/uL (ref 3.4–10.8)

## 2023-08-21 LAB — VITAMIN B12: Vitamin B-12: 392 pg/mL (ref 232–1245)

## 2023-08-23 NOTE — Progress Notes (Signed)
Hello Nickolaos,  Your lab result is normal and/or stable.Some minor variations that are not significant are commonly marked abnormal, but do not represent any medical problem for you.  Best regards, Kaimana Neuzil, M.D.

## 2023-08-26 ENCOUNTER — Other Ambulatory Visit: Payer: Self-pay | Admitting: *Deleted

## 2023-08-26 MED ORDER — TRUE METRIX AIR GLUCOSE METER W/DEVICE KIT: PACK | 0 refills | Status: AC

## 2023-08-27 ENCOUNTER — Telehealth: Payer: Self-pay

## 2023-08-27 ENCOUNTER — Other Ambulatory Visit: Payer: Self-pay | Admitting: Family Medicine

## 2023-08-27 DIAGNOSIS — E119 Type 2 diabetes mellitus without complications: Secondary | ICD-10-CM

## 2023-08-27 MED ORDER — BASAGLAR KWIKPEN 100 UNIT/ML ~~LOC~~ SOPN: 20.0000 [IU] | PEN_INJECTOR | Freq: Every day | SUBCUTANEOUS | 5 refills | Status: AC

## 2023-08-27 NOTE — Telephone Encounter (Signed)
 Rec'd refill request fax from [employer] (for [facility] patient assistance).   Refills needed for [employer] Please send a 90 day RX with refills. Thanks!

## 2023-09-02 ENCOUNTER — Ambulatory Visit (INDEPENDENT_AMBULATORY_CARE_PROVIDER_SITE_OTHER): Payer: Medicare HMO

## 2023-09-02 DIAGNOSIS — E538 Deficiency of other specified B group vitamins: Secondary | ICD-10-CM

## 2023-09-02 NOTE — Progress Notes (Signed)
 Patient is in office today for a nurse visit for B12 Injection. Patient Injection was given in the  Right deltoid. Patient tolerated injection well.

## 2023-09-07 NOTE — Progress Notes (Signed)
 History of Present Illness: Patrick Moss is here today for continued follow-up of prostate cancer, following curative therapy.  6.13.2023: He underwent ultrasound and biopsy of the prostate.  PSA 13.2, prostate volume 105 mL, PSA density 0.13.  Biopsy results as follows:    9/12 cores positive-- 1 core  revealed GG 1 pattern 1 core revealed GG 2 pattern 7 cores revealed GG 3 pattern  Perineural invasion was found in 4 of the cores.  PET CT scan was performed on 6.22.2023.  This revealed no evidence of extra prostatic disease.   8.22.2023: ST ADT commenced-the patient received Firmagon 240 mg.   9.5.2023: Underwent placement of fiducial markers and SpaceOAR.  9.26.2023: Received a 49-month leuprolide injection.   10.2.2023 - 04/25/2022-received 28 fractions of EBRT  11.22.2023: Admitted for management of a CVA  3.25.2025:    Past Medical History:  Diagnosis Date   Borderline glaucoma of both eyes    BPH associated with nocturia    CKD (chronic kidney disease), stage II    Complication of anesthesia    per pt emergent hiccups   Hyperlipidemia, mixed    Hypertension    Malignant neoplasm prostate (HCC) 11/2021   primary urologist---  dr dahlstadt/  radiation oncology [facility];  dx 06/ 2023,  Gleason 4+3,  PSA  13.2   OA (osteoarthritis)    knees   Type 2 diabetes mellitus treated with insulin (HCC)    followed by pcp ( previous seen by endocriologist--- dr g. nida ,lov note in epic 02-19-2021);    (02-11-2022  pt stated checks blood sugar twice daily,  fasting sugar average 90s  to 120s)   Wears dentures    upper   Wears glasses     Past Surgical History:  Procedure Laterality Date   FEMUR IM NAIL Right 05/03/2021   @[id number] --  [facility]   FOOT FRACTURE SURGERY Right    2017;   ORIF , per pt plate under foot   GOLD SEED IMPLANT N/A 02/18/2022   Procedure: GOLD SEED IMPLANT;  Surgeon: Belva Agee, MD;  Location: [facility];  Service:  Urology;  Laterality: N/A;  30 MINS   INGUINAL HERNIA REPAIR Bilateral 2007   approx   IR ANGIO INTRA EXTRACRAN SEL COM CAROTID INNOMINATE UNI R MOD SED  05/14/2022   IR ANGIO VERTEBRAL SEL VERTEBRAL UNI R MOD SED  05/14/2022   IR INTRAVSC STENT CERV CAROTID W/EMB-PROT MOD SED INCL ANGIO  05/14/2022   IR RADIOLOGIST EVAL & MGMT  06/03/2022   RADIOLOGY WITH ANESTHESIA N/A 05/14/2022   Procedure: R ICA stent;  Surgeon: Julieanne Cotton, MD;  Location: [facility] OR;  Service: Radiology;  Laterality: N/A;   SPACE OAR INSTILLATION N/A 02/18/2022   Procedure: SPACE OAR INSTILLATION;  Surgeon: Belva Agee, MD;  Location: [facility];  Service: Urology;  Laterality: N/A;    Home Medications:  Allergies as of 09/08/2023       Reactions   Septra [sulfamethoxazole-trimethoprim] Hives        Medication List        Accurate as of September 07, 2023 12:11 PM. If you have any questions, ask your nurse or doctor.          aspirin 81 MG chewable tablet Chew 1 tablet (81 mg total) by mouth daily.   B-D ULTRAFINE III SHORT PEN 31G X 8 MM Misc Generic drug: Insulin Pen Needle USE  1  PEN  NEEDLE AS DIRECTED  Basaglar KwikPen 100 UNIT/ML Inject 20 Units into the skin daily.   BLOOD GLUCOSE TEST STRIPS Strp 1 each by In Vitro route in the morning, at noon, and at bedtime. May substitute to any manufacturer covered by patient's insurance.   calcium carbonate 1500 (600 Ca) MG Tabs tablet Commonly known as: OSCAL Take 1,500 mg by mouth 2 (two) times daily with a meal.   fexofenadine 180 MG tablet Commonly known as: ALLEGRA Take 1 tablet (180 mg total) by mouth daily. For allergy symptoms   FreeStyle Libre 3 Reader Hardie Pulley USE AS DIRECTED   FreeStyle Libre 3 Sensor Misc 1 Units by Does not apply route every 2 (two) hours as needed.   lisinopril 20 MG tablet Commonly known as: ZESTRIL Take 1 tablet (20 mg total) by mouth daily.   metFORMIN 500 MG 24 hr tablet Commonly  known as: GLUCOPHAGE-XR Take 1 tablet (500 mg total) by mouth daily with breakfast.   omega-3 fish oil 1000 MG Caps capsule Commonly known as: MAXEPA Take 1 capsule by mouth daily.   pantoprazole 40 MG tablet Commonly known as: PROTONIX Take 1 tablet (40 mg total) by mouth daily.   rosuvastatin 5 MG tablet Commonly known as: CRESTOR Take 1 tablet (5 mg total) by mouth daily.   True Metrix Air Glucose Meter w/Device Kit Test BS in the morning, at noon and at night Dx E11.9   Trulicity 1.5 MG/0.5ML Soaj Generic drug: Dulaglutide Inject 1.5 mg into the skin once a week. Saturday's   Vitamin D3 50 MCG (2000 UT) Tabs Take 1 tablet (50 mcg total) by mouth daily.        Allergies:  Allergies  Allergen Reactions   Septra [Sulfamethoxazole-Trimethoprim] Hives    Family History  Problem Relation Age of Onset   Diabetes Mother    Stroke Mother    Diabetes Father    Stroke Father    Diabetes Brother     Social History:  reports that he has never smoked. He has never used smokeless tobacco. He reports that he does not drink alcohol and does not use drugs.  ROS: A complete review of systems was performed.  All systems are negative except for pertinent findings as noted.  Physical Exam:  Vital signs in last 24 hours: There were no vitals taken for this visit. Constitutional:  Alert and oriented, No acute distress Cardiovascular: Regular rate  Respiratory: Normal respiratory effort Neurologic: Grossly intact, no focal deficits Psychiatric: Normal mood and affect  I have reviewed prior pt notes  I have reviewed urinalysis results  I have independently reviewed prior imaging--prostate ultrasound/volume  I have reviewed prior PSA and pathology results     Impression/Assessment:  1.  Grade group 3 prostate cancer, status post ST ADT/EBRT.  Doing well  2.  BPH with moderate symptomatology but not terribly bothersome  Plan:

## 2023-09-08 ENCOUNTER — Encounter: Payer: Self-pay | Admitting: Urology

## 2023-09-08 ENCOUNTER — Ambulatory Visit (INDEPENDENT_AMBULATORY_CARE_PROVIDER_SITE_OTHER): Payer: Medicare HMO | Admitting: Urology

## 2023-09-08 VITALS — BP 174/75 | HR 65

## 2023-09-08 DIAGNOSIS — N401 Enlarged prostate with lower urinary tract symptoms: Secondary | ICD-10-CM

## 2023-09-08 DIAGNOSIS — Z8546 Personal history of malignant neoplasm of prostate: Secondary | ICD-10-CM

## 2023-09-08 LAB — URINALYSIS, ROUTINE W REFLEX MICROSCOPIC
Bilirubin, UA: NEGATIVE
Glucose, UA: NEGATIVE
Ketones, UA: NEGATIVE
Leukocytes,UA: NEGATIVE
Nitrite, UA: NEGATIVE
Specific Gravity, UA: 1.025 (ref 1.005–1.030)
Urobilinogen, Ur: 0.2 mg/dL (ref 0.2–1.0)
pH, UA: 6 (ref 5.0–7.5)

## 2023-09-08 LAB — MICROSCOPIC EXAMINATION: Bacteria, UA: NONE SEEN

## 2023-09-08 MED ORDER — TAMSULOSIN HCL 0.4 MG PO CAPS: 0.4000 mg | ORAL_CAPSULE | Freq: Every day | ORAL | 3 refills | Status: DC

## 2023-09-09 LAB — PSA: Prostate Specific Ag, Serum: <0.1 ng/mL (ref 0.0–4.0)

## 2023-09-14 ENCOUNTER — Telehealth: Payer: Self-pay

## 2023-09-14 NOTE — Telephone Encounter (Signed)
 Called to relay message from MD Pt voiced his understanding

## 2023-09-14 NOTE — Telephone Encounter (Signed)
-----   Message from Bertram Millard Dahlstedt sent at 09/13/2023  6:43 PM EDT ----- Pllease call --good news psa 0 ----- Message ----- From: Nell Range Lab Results In Sent: 09/08/2023   3:36 PM EDT To: Marcine Matar, MD

## 2023-10-07 ENCOUNTER — Ambulatory Visit (INDEPENDENT_AMBULATORY_CARE_PROVIDER_SITE_OTHER)

## 2023-10-07 DIAGNOSIS — E538 Deficiency of other specified B group vitamins: Secondary | ICD-10-CM | POA: Diagnosis not present

## 2023-10-07 NOTE — Progress Notes (Signed)
 Patient is in office today for a nurse visit for B12 Injection. Patient Injection was given in the  Right deltoid. Patient tolerated injection well.

## 2023-10-09 ENCOUNTER — Ambulatory Visit: Payer: Medicare HMO

## 2023-10-09 VITALS — BP 174/75 | HR 65 | Ht 66.0 in | Wt 169.0 lb

## 2023-10-09 DIAGNOSIS — Z0000 Encounter for general adult medical examination without abnormal findings: Secondary | ICD-10-CM | POA: Diagnosis not present

## 2023-10-09 NOTE — Progress Notes (Signed)
 Subjective:   Patrick Moss is a 83 y.o. who presents for a Medicare Wellness preventive visit.  Visit Complete: Virtual I connected with  Thorsten L Petter on 10/09/23 by a audio enabled telemedicine application and verified that I am speaking with the correct person using two identifiers.  Patient Location: Home  Provider Location: Home Office  I discussed the limitations of evaluation and management by telemedicine. The patient expressed understanding and agreed to proceed.  Vital Signs: Because this visit was a virtual/telehealth visit, some criteria may be missing or patient reported. Any vitals not documented were not able to be obtained and vitals that have been documented are patient reported.  VideoDeclined- This patient declined Librarian, academic. Therefore the visit was completed with audio only.  Persons Participating in Visit: Patient.  AWV Questionnaire: No: Patient Medicare AWV questionnaire was not completed prior to this visit.  Cardiac Risk Factors include: advanced age (>35men, >41 women);diabetes mellitus;obesity (BMI >30kg/m2);male gender;hypertension;dyslipidemia;Other (see comment), Risk factor comments: stroke     Objective:    Today's Vitals   10/09/23 0841  BP: (!) 174/75  Pulse: 65  Weight: 169 lb (76.7 kg)  Height: 5\' 6"  (1.676 m)   Body mass index is 27.28 kg/m.     10/09/2023    8:50 AM 10/07/2022    8:24 AM 05/21/2022    1:11 PM 05/14/2022   11:31 AM 05/10/2022    4:04 AM 05/07/2022   12:30 PM 02/18/2022    7:27 AM  Advanced Directives  Does Patient Have a Medical Advance Directive? Yes Yes No No No No Yes  Type of Estate agent of [employer] Power of [place];Living will     Living will  Does patient want to make changes to medical advance directive?       No - Patient declined  Copy of Healthcare Power of Attorney in Chart? Yes - validated most recent copy scanned in chart (See  row information) No - copy requested       Would patient like information on creating a medical advance directive?   No - Patient declined   No - Patient declined     Current Medications (verified) Outpatient Encounter Medications as of 10/09/2023  Medication Sig   aspirin  81 MG chewable tablet Chew 1 tablet (81 mg total) by mouth daily.   B-D ULTRAFINE III SHORT PEN 31G X 8 MM MISC USE  1  PEN  NEEDLE AS DIRECTED   Blood Glucose Monitoring Suppl (TRUE METRIX AIR GLUCOSE METER) w/Device KIT Test BS in the morning, at noon and at night Dx E11.9   calcium  carbonate (OSCAL) 1500 (600 Ca) MG TABS tablet Take 1,500 mg by mouth 2 (two) times daily with a meal.   Cholecalciferol (VITAMIN D3) 50 MCG (2000 UT) TABS Take 1 tablet (50 mcg total) by mouth daily.   Dulaglutide  (TRULICITY ) 1.5 MG/0.5ML SOAJ Inject 1.5 mg into the skin once a week. Saturday's (Patient taking differently: Inject 1.5 mg into the skin once a week. Saturday's)   fexofenadine  (ALLEGRA ) 180 MG tablet Take 1 tablet (180 mg total) by mouth daily. For allergy symptoms   Glucose Blood (BLOOD GLUCOSE TEST STRIPS) STRP 1 each by In Vitro route in the morning, at noon, and at bedtime. May substitute to any manufacturer covered by patient's insurance.   Insulin  Glargine (BASAGLAR  KWIKPEN) 100 UNIT/ML Inject 20 Units into the skin daily.   lisinopril  (ZESTRIL ) 20 MG tablet Take 1 tablet (20  mg total) by mouth daily.   metFORMIN  (GLUCOPHAGE -XR) 500 MG 24 hr tablet Take 1 tablet (500 mg total) by mouth daily with breakfast.   omega-3 fish oil (MAXEPA) 1000 MG CAPS capsule Take 1 capsule by mouth daily.   pantoprazole  (PROTONIX ) 40 MG tablet Take 1 tablet (40 mg total) by mouth daily.   rosuvastatin  (CRESTOR ) 5 MG tablet Take 1 tablet (5 mg total) by mouth daily.   tamsulosin  (FLOMAX ) 0.4 MG CAPS capsule Take 1 capsule (0.4 mg total) by mouth daily after supper.   [DISCONTINUED] apixaban  (ELIQUIS ) 2.5 MG TABS tablet Take 1 tablet (2.5 mg total)  by mouth 2 (two) times daily.   [DISCONTINUED] aspirin  81 MG chewable tablet Chew 1 tablet (81 mg total) by mouth daily. (Patient not taking: Reported on 05/21/2023)   [DISCONTINUED] Blood Glucose Monitoring Suppl DEVI 1 each by Does not apply route in the morning, at noon, and at bedtime. May substitute to any manufacturer covered by patient's insurance.   [DISCONTINUED] celecoxib  (CELEBREX ) 400 MG capsule Take 1 capsule (400 mg total) by mouth daily. With food (Patient not taking: Reported on 08/20/2023)   [DISCONTINUED] clopidogrel  (PLAVIX ) 75 MG tablet Take 1 tablet (75 mg total) by mouth daily. (Patient not taking: Reported on 05/20/2023)   [DISCONTINUED] Continuous Glucose Receiver (FREESTYLE LIBRE 3 READER) DEVI USE AS DIRECTED (Patient not taking: Reported on 10/09/2023)   [DISCONTINUED] Continuous Glucose Sensor (FREESTYLE LIBRE 3 SENSOR) MISC 1 Units by Does not apply route every 2 (two) hours as needed. (Patient not taking: Reported on 10/09/2023)   [DISCONTINUED] diclofenac  Sodium (VOLTAREN ) 1 % GEL Apply 4 g topically 4 (four) times daily as needed. For the knee (Patient not taking: Reported on 08/20/2023)   [DISCONTINUED] Dulaglutide  (TRULICITY ) 1.5 MG/0.5ML SOPN Inject 1.5 mg into the skin once a week. Saturday's   [DISCONTINUED] fexofenadine  (ALLEGRA ) 180 MG tablet Take 1 tablet (180 mg total) by mouth daily. For allergy symptoms (Patient not taking: Reported on 05/21/2023)   [DISCONTINUED] Insulin  Glargine (BASAGLAR  KWIKPEN) 100 UNIT/ML Inject 20 Units into the skin daily.   [DISCONTINUED] metFORMIN  (GLUCOPHAGE -XR) 500 MG 24 hr tablet Take 2 tablets (1,000 mg total) by mouth daily with breakfast.   [DISCONTINUED] pantoprazole  (PROTONIX ) 40 MG tablet Take 1 tablet (40 mg total) by mouth daily.   Facility-Administered Encounter Medications as of 10/09/2023  Medication   cyanocobalamin  (VITAMIN B12) injection 1,000 mcg   sodium phosphate  (FLEET) 7-19 GM/118ML enema 1 enema    Allergies  (verified) Septra [sulfamethoxazole-trimethoprim]   History: Past Medical History:  Diagnosis Date   Borderline glaucoma of both eyes    BPH associated with nocturia    CKD (chronic kidney disease), stage II    Complication of anesthesia    per pt emergent hiccups   Hyperlipidemia, mixed    Hypertension    Malignant neoplasm prostate (HCC) 11/2021   primary urologist---  dr dahlstadt/  radiation oncology [facility];  dx 06/ 2023,  Gleason 4+3,  PSA  13.2   OA (osteoarthritis)    knees   Type 2 diabetes mellitus treated with insulin  (HCC)    followed by pcp ( previous seen by endocriologist--- dr g. nida ,lov note in epic 02-19-2021);    (02-11-2022  pt stated checks blood sugar twice daily,  fasting sugar average 90s  to 120s)   Wears dentures    upper   Wears glasses    Past Surgical History:  Procedure Laterality Date   FEMUR IM NAIL Right  05/03/2021   @[id number] --  W-S   FOOT FRACTURE SURGERY Right    2017;   ORIF , per pt plate under foot   GOLD SEED IMPLANT N/A 02/18/2022   Procedure: GOLD SEED IMPLANT;  Surgeon: Sherlyn Ditto, MD;  Location: [facility];  Service: Urology;  Laterality: N/A;  30 MINS   INGUINAL HERNIA REPAIR Bilateral 2007   approx   IR ANGIO INTRA EXTRACRAN SEL COM CAROTID INNOMINATE UNI R MOD SED  05/14/2022   IR ANGIO VERTEBRAL SEL VERTEBRAL UNI R MOD SED  05/14/2022   IR INTRAVSC STENT CERV CAROTID W/EMB-PROT MOD SED INCL ANGIO  05/14/2022   IR RADIOLOGIST EVAL & MGMT  06/03/2022   RADIOLOGY WITH ANESTHESIA N/A 05/14/2022   Procedure: R ICA stent;  Surgeon: Luellen Sages, MD;  Location: [facility] OR;  Service: Radiology;  Laterality: N/A;   SPACE OAR INSTILLATION N/A 02/18/2022   Procedure: SPACE OAR INSTILLATION;  Surgeon: Sherlyn Ditto, MD;  Location: [facility];  Service: Urology;  Laterality: N/A;   Family History  Problem Relation Age of Onset   Diabetes Mother    Stroke Mother    Diabetes  Father    Stroke Father    Diabetes Brother    Social History   Socioeconomic History   Marital status: Divorced    Spouse name: Not on file   Number of children: 2   Years of education: 10   Highest education level: 10th grade  Occupational History   Occupation: retired  Tobacco Use   Smoking status: Never   Smokeless tobacco: Never  Vaping Use   Vaping status: Never Used  Substance and Sexual Activity   Alcohol use: No    Alcohol/week: 0.0 standard drinks of alcohol   Drug use: Never   Sexual activity: Not on file  Other Topics Concern   Not on file  Social History Narrative   Lives alone - son lives next door   [place] friend on his street - they check on each other several times per day and spend time together   Social Drivers of Health   Financial Resource Strain: Low Risk  (10/09/2023)   Overall Financial Resource Strain (CARDIA)    Difficulty of Paying Living Expenses: Not hard at all  Food Insecurity: No Food Insecurity (10/09/2023)   Hunger Vital Sign    Worried About Running Out of Food in the Last Year: Never true    Ran Out of Food in the Last Year: Never true  Transportation Needs: No Transportation Needs (10/09/2023)   PRAPARE - Administrator, Civil Service (Medical): No    Lack of Transportation (Non-Medical): No  Physical Activity: Insufficiently Active (10/09/2023)   Exercise Vital Sign    Days of Exercise per Week: 7 days    Minutes of Exercise per Session: 20 min  Stress: No Stress Concern Present (10/09/2023)   [employer] of Occupational Health - Occupational Stress Questionnaire    Feeling of Stress : Not at all  Social Connections: Moderately Integrated (10/09/2023)   Social Connection and Isolation Panel [NHANES]    Frequency of Communication with Friends and Family: More than three times a week    Frequency of Social Gatherings with Friends and Family: More than three times a week    Attends Religious Services: More than 4 times  per year    Active Member of [employer] or Organizations: No    Attends Engineer, structural: More than  4 times per year    Marital Status: Divorced    Tobacco Counseling Counseling given: Yes    Clinical Intake:  Pre-visit preparation completed: Yes  Pain : No/denies pain     BMI - recorded: 27.28 Nutritional Status: BMI 25 -29 Overweight Nutritional Risks: None Diabetes: Yes CBG done?: Yes (100)  Lab Results  Component Value Date   HGBA1C 6.0 (H) 08/20/2023   HGBA1C 5.4 05/21/2023   HGBA1C 6.5 (H) 02/19/2023     How often do you need to have someone help you when you read instructions, pamphlets, or other written materials from your doctor or pharmacy?: 3 - Sometimes (per pt stated have a lady friend down the street from his house that helps him w/reading ie dr directions)  Interpreter Needed?: No  Information entered by :: Alia T/cma   Activities of Daily Living     10/09/2023    8:46 AM  In your present state of health, do you have any difficulty performing the following activities:  Hearing? 0  Vision? 0  Difficulty concentrating or making decisions? 1  Comment pt stated that has lady friend to help when he need help  Walking or climbing stairs? 0  Dressing or bathing? 0  Doing errands, shopping? 0  Preparing Food and eating ? N  Using the Toilet? N  In the past six months, have you accidently leaked urine? N  Do you have problems with loss of bowel control? N  Managing your Medications? N  Managing your Finances? N  Housekeeping or managing your Housekeeping? N    Patient Care Team: Roise Cleaver, MD as PCP - General (Family Medicine) Baby Bolt, MD as Consulting Physician (Endocrinology) Candi Chafe, Pearletha Bouche, MD as Consulting Physician (Ophthalmology) Delilah Fend, [facility] as Pharmacist (Family Medicine) Pilson, Annelle Kiel, MD (Orthopedic Surgery)  Indicate any recent Medical Services you may have received from other than  [facility] providers in the past year (date may be approximate).     Assessment:   This is a routine wellness examination for [place].  Hearing/Vision screen Hearing Screening - Comments:: Pt denies hearing dif Vision Screening - Comments:: Pt denies vision dif Pt goes to Dr. Candi Chafe in [place]   Goals Addressed             This Visit's Progress    Exercise 3x per week (30 min per time)   On track    10/03/2021 AWV Goal: Exercise for General Health  Patient will verbalize understanding of the benefits of increased physical activity: Exercising regularly is important. It will improve your overall fitness, flexibility, and endurance. Regular exercise also will improve your overall health. It can help you control your weight, reduce stress, and improve your bone density. Over the next year, patient will increase physical activity as tolerated with a goal of at least 150 minutes of moderate physical activity per week.  You can tell that you are exercising at a moderate intensity if your heart starts beating faster and you start breathing faster but can still hold a conversation. Moderate-intensity exercise ideas include: Walking 1 mile (1.6 km) in about 15 minutes Biking Hiking Golfing Dancing Water aerobics Patient will verbalize understanding of everyday activities that increase physical activity by providing examples like the following: Yard work, such as: Insurance underwriter Gardening Washing windows or floors Patient will be able to explain general safety guidelines for exercising:  Before you start  a new exercise program, talk with your health care provider. Do not exercise so much that you hurt yourself, feel dizzy, or get very short of breath. Wear comfortable clothes and wear shoes with good support. Drink plenty of water while you exercise to prevent dehydration or heat stroke. Work out  until your breathing and your heartbeat get faster.   Goals Addressed             This Visit's Progress    Exercise 3x per week (30 min per time)   On track    09/30/2019 AWV Goal: Exercise for General Health  Patient will verbalize understanding of the benefits of increased physical activity: Exercising regularly is important. It will improve your overall fitness, flexibility, and endurance. Regular exercise also will improve your overall health. It can help you control your weight, reduce stress, and improve your bone density. Over the next year, patient will increase physical activity as tolerated with a goal of at least 150 minutes of moderate physical activity per week.  You can tell that you are exercising at a moderate intensity if your heart starts beating faster and you start breathing faster but can still hold a conversation. Moderate-intensity exercise ideas include: Walking 1 mile (1.6 km) in about 15 minutes Biking Hiking Golfing Dancing Water aerobics Patient will verbalize understanding of everyday activities that increase physical activity by providing examples like the following: Yard work, such as: Insurance underwriter Gardening Washing windows or floors Patient will be able to explain general safety guidelines for exercising:  Before you start a new exercise program, talk with your health care provider. Do not exercise so much that you hurt yourself, feel dizzy, or get very short of breath. Wear comfortable clothes and wear shoes with good support. Drink plenty of water while you exercise to prevent dehydration or heat stroke. Work out until your breathing and your heartbeat get faster.                 Depression Screen     10/09/2023    8:56 AM 08/20/2023    9:02 AM 05/21/2023    9:31 AM 02/26/2023    9:58 AM 02/19/2023    9:17 AM 11/17/2022    7:59 AM 08/04/2022    9:40  AM  PHQ 2/9 Scores  PHQ - 2 Score 0 0 0 0 0 0 0  PHQ- 9 Score 0 0 0 0       Fall Risk     10/09/2023    8:51 AM 05/21/2023    9:30 AM 02/26/2023    9:58 AM 02/19/2023    9:17 AM 01/15/2023   11:44 AM  Fall Risk   Falls in the past year? 0 0  0 0  Number falls in past yr: 0      Injury with Fall? 0      Risk for fall due to : No Fall Risks  History of fall(s)    Follow up Falls prevention discussed;Falls evaluation completed  Falls evaluation completed      MEDICARE RISK AT HOME:  Medicare Risk at Home Any stairs in or around the home?: Yes If so, are there any without handrails?: Yes Home free of loose throw rugs in walkways, pet beds, electrical cords, etc?: Yes Adequate lighting in your home to reduce risk of falls?: Yes Life alert?: No Use of a cane, walker or w/c?: Yes (use  cane) Grab bars in the bathroom?: Yes Shower chair or bench in shower?: Yes Elevated toilet seat or a handicapped toilet?: Yes  TIMED UP AND GO:  Was the test performed?  no  Cognitive Function: 6CIT completed        10/09/2023    8:59 AM 10/07/2022    8:22 AM 10/03/2021    8:29 AM 10/02/2020    8:50 AM 09/30/2019    8:43 AM  6CIT Screen  What Year? 4 points 0 points 0 points 0 points 0 points  What month? 0 points 0 points 0 points 0 points 0 points  What time? 0 points 0 points 0 points 0 points 0 points  Count back from 20 0 points 0 points 0 points 0 points 0 points  Months in reverse 2 points 0 points 0 points 0 points 0 points  Repeat phrase 10 points 0 points 0 points 0 points 2 points  Total Score 16 points 0 points 0 points 0 points 2 points    Immunizations Immunization History  Administered Date(s) Administered   Fluad Quad(high Dose 65+) 04/07/2016, 04/30/2016, 03/20/2017, 03/17/2018, 03/29/2020   Influenza, High Dose Seasonal PF 03/28/2015, 04/07/2016, 04/30/2016, 03/20/2017, 03/17/2018, 03/07/2019   Influenza,inj,Quad PF,6+ Mos 04/07/2016, 04/30/2016, 03/20/2017, 03/17/2018    Influenza-Unspecified 04/09/2023   Moderna Sars-Covid-2 Vaccination 07/06/2019, 08/03/2019, 05/02/2020   Pneumococcal Conjugate-13 06/30/2016   Pneumococcal Polysaccharide-23 07/15/2017   Tdap 12/27/2014   Zoster Recombinant(Shingrix ) 01/24/2021, 08/27/2021    Screening Tests Health Maintenance  Topic Date Due   Diabetic kidney evaluation - Urine ACR  08/05/2023   COVID-19 Vaccine (4 - 2024-25 season) 06/05/2024 (Originally 02/15/2023)   INFLUENZA VACCINE  01/15/2024   HEMOGLOBIN A1C  02/20/2024   OPHTHALMOLOGY EXAM  03/18/2024   Diabetic kidney evaluation - eGFR measurement  08/19/2024   FOOT EXAM  08/19/2024   Medicare Annual Wellness (AWV)  10/08/2024   DTaP/Tdap/Td (2 - Td or Tdap) 12/26/2024   Pneumonia Vaccine 70+ Years old  Completed   Zoster Vaccines- Shingrix   Completed   HPV VACCINES  Aged Out   Meningococcal B Vaccine  Aged Out   Hepatitis C Screening  Discontinued    Health Maintenance  Health Maintenance Due  Topic Date Due   Diabetic kidney evaluation - Urine ACR  08/05/2023   Health Maintenance Items Addressed: See Nurse Notes  Additional Screening:  Vision Screening: Recommended annual ophthalmology exams for early detection of glaucoma and other disorders of the eye.  Dental Screening: Recommended annual dental exams for proper oral hygiene  Community Resource Referral / Chronic Care Management: CRR required this visit?  No   CCM required this visit?  No     Plan:     I have personally reviewed and noted the following in the patient's chart:   Medical and social history Use of alcohol, tobacco or illicit drugs  Current medications and supplements including opioid prescriptions. Patient is not currently taking opioid prescriptions. Functional ability and status Nutritional status Physical activity Advanced directives List of other physicians Hospitalizations, surgeries, and ER visits in previous 12 months Vitals Screenings to include  cognitive, depression, and falls Referrals and appointments  In addition, I have reviewed and discussed with patient certain preventive protocols, quality metrics, and best practice recommendations. A written personalized care plan for preventive services as well as general preventive health recommendations were provided to patient.     Michaelle Adolphus, CMA   10/09/2023   After Visit Summary: (Declined) Due to this being a  telephonic visit, with patients personalized plan was offered to patient but patient Declined AVS at this time   Notes: Please refer to Routing Comments.

## 2023-10-09 NOTE — Patient Instructions (Signed)
 [name] , Thank you for taking time to come for your Medicare Wellness Visit. I appreciate your ongoing commitment to your health goals. Please review the following plan we discussed and let me know if I can assist you in the future.   Referrals/Orders/Follow-Ups/Clinician Recommendations: n/a  This is a list of the screening recommended for you and due dates:  Health Maintenance  Topic Date Due   Yearly kidney health urinalysis for diabetes  08/05/2023   COVID-19 Vaccine (4 - 2024-25 season) 06/05/2024*   Flu Shot  01/15/2024   Hemoglobin A1C  02/20/2024   Eye exam for diabetics  03/18/2024   Yearly kidney function blood test for diabetes  08/19/2024   Complete foot exam   08/19/2024   Medicare Annual Wellness Visit  10/08/2024   DTaP/Tdap/Td vaccine (2 - Td or Tdap) 12/26/2024   Pneumonia Vaccine  Completed   Zoster (Shingles) Vaccine  Completed   HPV Vaccine  Aged Out   Meningitis B Vaccine  Aged Out   Hepatitis C Screening  Discontinued  *Topic was postponed. The date shown is not the original due date.    Advanced directives: (In Chart) A copy of your advanced directives are scanned into your chart should your provider ever need it.  Next Medicare Annual Wellness Visit scheduled for next year: Yes

## 2023-11-02 ENCOUNTER — Other Ambulatory Visit: Payer: Self-pay | Admitting: Family Medicine

## 2023-11-11 ENCOUNTER — Ambulatory Visit (INDEPENDENT_AMBULATORY_CARE_PROVIDER_SITE_OTHER): Admitting: *Deleted

## 2023-11-11 DIAGNOSIS — E538 Deficiency of other specified B group vitamins: Secondary | ICD-10-CM

## 2023-11-11 NOTE — Progress Notes (Signed)
 Patient is in office today for a nurse visit for B12 Injection. Patient Injection was given in the  Left deltoid. Patient tolerated injection well.

## 2023-11-26 ENCOUNTER — Other Ambulatory Visit: Payer: Self-pay | Admitting: Family Medicine

## 2023-11-26 DIAGNOSIS — E782 Mixed hyperlipidemia: Secondary | ICD-10-CM

## 2023-12-02 ENCOUNTER — Ambulatory Visit: Admitting: Family Medicine

## 2023-12-09 ENCOUNTER — Encounter: Payer: Self-pay | Admitting: Family Medicine

## 2023-12-09 ENCOUNTER — Ambulatory Visit: Payer: Self-pay | Admitting: Family Medicine

## 2023-12-09 ENCOUNTER — Ambulatory Visit: Admitting: Family Medicine

## 2023-12-09 VITALS — BP 170/69 | HR 62 | Temp 97.8°F | Ht 66.0 in | Wt 166.0 lb

## 2023-12-09 DIAGNOSIS — Z794 Long term (current) use of insulin: Secondary | ICD-10-CM | POA: Diagnosis not present

## 2023-12-09 DIAGNOSIS — N1831 Chronic kidney disease, stage 3a: Secondary | ICD-10-CM

## 2023-12-09 DIAGNOSIS — E782 Mixed hyperlipidemia: Secondary | ICD-10-CM

## 2023-12-09 DIAGNOSIS — E119 Type 2 diabetes mellitus without complications: Secondary | ICD-10-CM

## 2023-12-09 DIAGNOSIS — I152 Hypertension secondary to endocrine disorders: Secondary | ICD-10-CM

## 2023-12-09 DIAGNOSIS — E1159 Type 2 diabetes mellitus with other circulatory complications: Secondary | ICD-10-CM | POA: Diagnosis not present

## 2023-12-09 LAB — BAYER DCA HB A1C WAIVED: HB A1C (BAYER DCA - WAIVED): 5.5 % (ref 4.8–5.6)

## 2023-12-09 LAB — LIPID PANEL

## 2023-12-09 MED ORDER — OLMESARTAN MEDOXOMIL 40 MG PO TABS: 40.0000 mg | ORAL_TABLET | Freq: Every day | ORAL | 1 refills | Status: DC

## 2023-12-09 NOTE — Progress Notes (Signed)
 Subjective:  Patient ID: Patrick Moss,  male    DOB: [birth year]  Age: 83 y.o.    CC: Diabetes (No concerns at this time. )   HPI Patrick Moss presents for  follow-up of hypertension. Patient has no history of headache chest pain or shortness of breath or recent cough. Patient also denies symptoms of TIA such as numbness weakness lateralizing. Patient denies side effects from medication. States taking it regularly.  Patient also  in for follow-up of elevated cholesterol. Doing well without complaints on current medication. Denies side effects  including myalgia and arthralgia and nausea. Also in today for liver function testing. Currently no chest pain, shortness of breath or other cardiovascular related symptoms noted.  Follow-up of diabetes. Patient does check blood sugar at home. Readings run between 90 and 113 fasting an prandial.  Patient denies symptoms such as excessive hunger or urinary frequency, excessive hunger, nausea No significant hypoglycemic spells noted. Medications reviewed. Pt reports taking them regularly. Pt. denies complication/adverse reaction today.    History Patrick Moss has a past medical history of Borderline glaucoma of both eyes, BPH associated with nocturia, CKD (chronic kidney disease), stage II, Complication of anesthesia, Hyperlipidemia, mixed, Hypertension, Malignant neoplasm prostate (HCC) (11/2021), OA (osteoarthritis), Type 2 diabetes mellitus treated with insulin  (HCC), Wears dentures, and Wears glasses.   He has a past surgical history that includes Femur IM nail (Right, 05/03/2021); Foot fracture surgery (Right); Inguinal hernia repair (Bilateral, 2007); Gold seed implant (N/A, 02/18/2022); SPACE OAR INSTILLATION (N/A, 02/18/2022); Radiology with anesthesia (N/A, 05/14/2022); IR ANGIO INTRA EXTRACRAN SEL COM CAROTID INNOMINATE UNI R MOD SED (05/14/2022); IR INTRAVSC STENT CERV CAROTID W/EMB-PROT MOD SED (05/14/2022); IR ANGIO VERTEBRAL SEL VERTEBRAL UNI R  MOD SED (05/14/2022); and IR Radiologist Eval & Mgmt (06/03/2022).   His family history includes Diabetes in his brother, father, and mother; Stroke in his father and mother.He reports that he has never smoked. He has never used smokeless tobacco. He reports that he does not drink alcohol and does not use drugs.  Current Outpatient Medications on File Prior to Visit  Medication Sig Dispense Refill   aspirin  81 MG chewable tablet Chew 1 tablet (81 mg total) by mouth daily. 90 each 3   B-D ULTRAFINE III SHORT PEN 31G X 8 MM MISC USE  1  PEN  NEEDLE AS DIRECTED 270 each 3   Blood Glucose Monitoring Suppl (TRUE METRIX AIR GLUCOSE METER) w/Device KIT Test BS in the morning, at noon and at night Dx E11.9 1 kit 0   calcium  carbonate (OSCAL) 1500 (600 Ca) MG TABS tablet Take 1,500 mg by mouth 2 (two) times daily with a meal.     Cholecalciferol (VITAMIN D3) 50 MCG (2000 UT) TABS Take 1 tablet (50 mcg total) by mouth daily. 100 tablet 3   Dulaglutide  (TRULICITY ) 1.5 MG/0.5ML SOAJ Inject 1.5 mg into the skin once a week. Saturday's 6 mL 3   fexofenadine  (ALLEGRA ) 180 MG tablet Take 1 tablet (180 mg total) by mouth daily. For allergy symptoms 90 tablet 3   Glucose Blood (BLOOD GLUCOSE TEST STRIPS) STRP 1 each by In Vitro route in the morning, at noon, and at bedtime. May substitute to any manufacturer covered by patient's insurance. 100 strip 10   Insulin  Glargine (BASAGLAR  KWIKPEN) 100 UNIT/ML Inject 20 Units into the skin daily. 45 mL 5   lisinopril  (ZESTRIL ) 20 MG tablet Take 1 tablet (20 mg total) by mouth daily. 90 tablet 3   metFORMIN  (  GLUCOPHAGE -XR) 500 MG 24 hr tablet Take 1 tablet (500 mg total) by mouth daily with breakfast. 180 tablet 0   omega-3 fish oil (MAXEPA) 1000 MG CAPS capsule Take 1 capsule by mouth daily.     pantoprazole  (PROTONIX ) 40 MG tablet TAKE 1 TABLET EVERY DAY 90 tablet 0   rosuvastatin  (CRESTOR ) 5 MG tablet TAKE 1 TABLET EVERY DAY 90 tablet 0   tamsulosin  (FLOMAX ) 0.4 MG CAPS  capsule Take 1 capsule (0.4 mg total) by mouth daily after supper. 90 capsule 3   Current Facility-Administered Medications on File Prior to Visit  Medication Dose Route Frequency Provider Last Rate Last Admin   cyanocobalamin  (VITAMIN B12) injection 1,000 mcg  1,000 mcg Intramuscular Q30 days Patrick Lowers, MD   1,000 mcg at 11/11/23 0914   sodium phosphate  (FLEET) 7-19 GM/118ML enema 1 enema  1 enema Rectal Once Newsome, George B, MD        ROS Review of Systems  Constitutional:  Negative for fever.  Respiratory:  Negative for shortness of breath.   Cardiovascular:  Negative for chest pain.  Endocrine: Negative for polyphagia.  Musculoskeletal:  Negative for arthralgias.  Skin:  Negative for rash.    Objective:  BP (!) 170/69   Pulse 62   Temp 97.8 F (36.6 C)   Ht 5' 6 (1.676 m)   Wt 166 lb (75.3 kg)   SpO2 97%   BMI 26.79 kg/m   BP Readings from Last 3 Encounters:  12/09/23 (!) 170/69  10/09/23 (!) 174/75  09/08/23 (!) 174/75    Wt Readings from Last 3 Encounters:  12/09/23 166 lb (75.3 kg)  10/09/23 169 lb (76.7 kg)  08/20/23 169 lb (76.7 kg)    Lab Results  Component Value Date   HGBA1C 6.0 (H) 08/20/2023   HGBA1C 5.4 05/21/2023   HGBA1C 6.5 (H) 02/19/2023    Physical Exam Vitals reviewed.  Constitutional:      Appearance: He is well-developed.  HENT:     Head: Normocephalic and atraumatic.     Right Ear: External ear normal.     Left Ear: External ear normal.     Mouth/Throat:     Pharynx: No oropharyngeal exudate or posterior oropharyngeal erythema.   Eyes:     Pupils: Pupils are equal, round, and reactive to light.    Cardiovascular:     Rate and Rhythm: Normal rate and regular rhythm.     Heart sounds: No murmur heard. Pulmonary:     Effort: No respiratory distress.     Breath sounds: Normal breath sounds.   Musculoskeletal:     Cervical back: Normal range of motion and neck supple.   Neurological:     Mental Status: He is alert  and oriented to person, place, and time.         Assessment & Plan:  Type 2 diabetes mellitus without complication, with long-term current use of insulin  (HCC) -     Microalbumin / creatinine urine ratio -     Bayer DCA Hb A1c Waived  Stage 3a chronic kidney disease (CKD) (HCC) -     CMP14+EGFR -     Lipid panel  Mixed hyperlipidemia -     CMP14+EGFR -     Lipid panel  Hypertension associated with diabetes (HCC) -     CMP14+EGFR    Follow-up: No follow-ups on file.  Moss Patrick, M.D.

## 2023-12-09 NOTE — Patient Instructions (Signed)
 Discontinue Lisinopril  when you receive the new medication called Olmesartan

## 2023-12-10 LAB — LIPID PANEL
Cholesterol, Total: 103 mg/dL (ref 100–199)
HDL: 44 mg/dL (ref >39)
LDL CALC COMMENT:: 2.3 ratio (ref 0.0–5.0)
LDL Chol Calc (NIH): 40 mg/dL (ref 0–99)
Triglycerides: 103 mg/dL (ref 0–149)
VLDL Cholesterol Cal: 19 mg/dL (ref 5–40)

## 2023-12-10 LAB — CMP14+EGFR
ALT: 13 IU/L (ref 0–44)
AST: 16 IU/L (ref 0–40)
Albumin: 4.3 g/dL (ref 3.7–4.7)
Alkaline Phosphatase: 94 IU/L (ref 44–121)
BUN/Creatinine Ratio: 12 (ref 10–24)
BUN: 15 mg/dL (ref 8–27)
Bilirubin Total: 0.5 mg/dL (ref 0.0–1.2)
CO2: 22 mmol/L (ref 20–29)
Calcium: 10.3 mg/dL — ABNORMAL HIGH (ref 8.6–10.2)
Chloride: 103 mmol/L (ref 96–106)
Creatinine, Ser: 1.24 mg/dL (ref 0.76–1.27)
Globulin, Total: 2.4 g/dL (ref 1.5–4.5)
Glucose: 77 mg/dL (ref 70–99)
Potassium: 4.5 mmol/L (ref 3.5–5.2)
Sodium: 143 mmol/L (ref 134–144)
Total Protein: 6.7 g/dL (ref 6.0–8.5)
eGFR: 58 mL/min/{1.73_m2} — ABNORMAL LOW (ref >59)

## 2023-12-10 LAB — MICROALBUMIN / CREATININE URINE RATIO
Creatinine, Urine: 218.2 mg/dL
Microalb/Creat Ratio: 20 mg/g{creat} (ref 0–29)
Microalbumin, Urine: 44.3 ug/mL

## 2023-12-12 ENCOUNTER — Encounter (HOSPITAL_COMMUNITY): Payer: Self-pay | Admitting: Interventional Radiology

## 2023-12-15 NOTE — Progress Notes (Signed)
Hello Nickolaos,  Your lab result is normal and/or stable.Some minor variations that are not significant are commonly marked abnormal, but do not represent any medical problem for you.  Best regards, Kaimana Neuzil, M.D.

## 2023-12-16 ENCOUNTER — Ambulatory Visit (INDEPENDENT_AMBULATORY_CARE_PROVIDER_SITE_OTHER): Admitting: *Deleted

## 2023-12-16 DIAGNOSIS — E538 Deficiency of other specified B group vitamins: Secondary | ICD-10-CM | POA: Diagnosis not present

## 2023-12-16 NOTE — Progress Notes (Signed)
 Patient is in office today for a nurse visit for B12 Injection. Patient Injection was given in the  right deltoid. Patient tolerated injection well.

## 2024-01-14 ENCOUNTER — Other Ambulatory Visit: Payer: Self-pay | Admitting: Family Medicine

## 2024-01-20 ENCOUNTER — Ambulatory Visit (INDEPENDENT_AMBULATORY_CARE_PROVIDER_SITE_OTHER)

## 2024-01-20 DIAGNOSIS — E538 Deficiency of other specified B group vitamins: Secondary | ICD-10-CM | POA: Diagnosis not present

## 2024-01-20 NOTE — Progress Notes (Signed)
 Patient is in office today for a nurse visit for B12 Injection. Patient Injection was given in the  Left deltoid. Patient tolerated injection well.

## 2024-02-05 ENCOUNTER — Other Ambulatory Visit: Payer: Self-pay | Admitting: Family Medicine

## 2024-02-05 DIAGNOSIS — E782 Mixed hyperlipidemia: Secondary | ICD-10-CM

## 2024-02-24 ENCOUNTER — Ambulatory Visit (INDEPENDENT_AMBULATORY_CARE_PROVIDER_SITE_OTHER): Admitting: *Deleted

## 2024-02-24 DIAGNOSIS — E538 Deficiency of other specified B group vitamins: Secondary | ICD-10-CM | POA: Diagnosis not present

## 2024-02-24 NOTE — Progress Notes (Signed)
 Patient is in office today for a nurse visit for B12 Injection. Patient Injection was given in the  Right deltoid. Patient tolerated injection well.

## 2024-03-02 ENCOUNTER — Ambulatory Visit (INDEPENDENT_AMBULATORY_CARE_PROVIDER_SITE_OTHER): Admitting: Family Medicine

## 2024-03-02 ENCOUNTER — Encounter: Payer: Self-pay | Admitting: Family Medicine

## 2024-03-02 VITALS — BP 155/74 | HR 68 | Temp 98.2°F | Ht 66.0 in | Wt 164.0 lb

## 2024-03-02 DIAGNOSIS — E119 Type 2 diabetes mellitus without complications: Secondary | ICD-10-CM

## 2024-03-02 DIAGNOSIS — E782 Mixed hyperlipidemia: Secondary | ICD-10-CM

## 2024-03-02 DIAGNOSIS — I10 Essential (primary) hypertension: Secondary | ICD-10-CM | POA: Diagnosis not present

## 2024-03-02 DIAGNOSIS — Z794 Long term (current) use of insulin: Secondary | ICD-10-CM | POA: Diagnosis not present

## 2024-03-02 DIAGNOSIS — Z23 Encounter for immunization: Secondary | ICD-10-CM | POA: Diagnosis not present

## 2024-03-02 DIAGNOSIS — Z8546 Personal history of malignant neoplasm of prostate: Secondary | ICD-10-CM | POA: Diagnosis not present

## 2024-03-02 LAB — BAYER DCA HB A1C WAIVED: HB A1C (BAYER DCA - WAIVED): 5.4 % (ref 4.8–5.6)

## 2024-03-02 NOTE — Progress Notes (Addendum)
 Subjective:  Patient ID: Patrick Moss, male    DOB: April 07, 1941  Age: 83 y.o. MRN: [id number]  CC: Medical Management of Chronic Issues   HPI  Discussed the use of AI scribe software for clinical note transcription with the patient, who gave verbal consent to proceed.  History of Present Illness  presents forFollow-up of diabetes. Patient denies symptoms such as polyuria, polydipsia, excessive hunger, nausea No significant hypoglycemic spells noted. Medications reviewed. Pt reports taking them regularly without complication/adverse reaction being reported today.  Lab Results  Component Value Date   HGBA1C 5.4 03/02/2024   HGBA1C 5.5 12/09/2023   HGBA1C 6.0 (H) 08/20/2023     presents for  follow-up of hypertension. Patient has no history of headache chest pain or shortness of breath or recent cough. Patient also denies symptoms of TIA such as focal numbness or weakness. Patient denies side effects from medication. States taking it regularly.   in for follow-up of elevated cholesterol. Doing well without complaints on current medication. Denies side effects of statin including myalgia and arthralgia and nausea. Currently no chest pain, shortness of breath or other cardiovascular related symptoms noted.      03/02/2024   11:37 AM 10/09/2023    8:56 AM 08/20/2023    9:02 AM  Depression screen PHQ 2/9  Decreased Interest 0 0 0  Down, Depressed, Hopeless 0 0 0  PHQ - 2 Score 0 0 0  Altered sleeping  0 0  Tired, decreased energy  0 0  Change in appetite  0 0  Feeling bad or failure about yourself   0 0  Trouble concentrating  0 0  Moving slowly or fidgety/restless  0 0  Suicidal thoughts  0 0  PHQ-9 Score  0 0  Difficult doing work/chores  Not difficult at all Not difficult at all    History Chayanne has a past medical history of Acute ischemic stroke (HCC) (05/07/2022), Borderline glaucoma of both eyes, BPH associated with nocturia, CKD (chronic kidney disease), stage II,  Complication of anesthesia, Hyperlipidemia, mixed, Hypertension, Malignant neoplasm prostate (HCC) (11/2021), OA (osteoarthritis), Type 2 diabetes mellitus treated with insulin  (HCC), Wears dentures, and Wears glasses.   He has a past surgical history that includes Femur IM nail (Right, 05/03/2021); Foot fracture surgery (Right); Inguinal hernia repair (Bilateral, 2007); Gold seed implant (N/A, 02/18/2022); SPACE OAR INSTILLATION (N/A, 02/18/2022); Radiology with anesthesia (N/A, 05/14/2022); IR ANGIO INTRA EXTRACRAN SEL COM CAROTID INNOMINATE UNI R MOD SED (05/14/2022); IR INTRAVSC STENT CERV CAROTID W/EMB-PROT MOD SED (05/14/2022); IR ANGIO VERTEBRAL SEL VERTEBRAL UNI R MOD SED (05/14/2022); and IR Radiologist Eval & Mgmt (06/03/2022).   His family history includes Diabetes in his brother, father, and mother; Stroke in his father and mother.He reports that he has never smoked. He has never used smokeless tobacco. He reports that he does not drink alcohol and does not use drugs.    ROS Review of Systems  Constitutional: Negative.   HENT: Negative.    Eyes:  Negative for visual disturbance.  Respiratory:  Negative for cough and shortness of breath.   Cardiovascular:  Negative for chest pain and leg swelling.  Gastrointestinal:  Negative for abdominal pain, diarrhea, nausea and vomiting.  Genitourinary:  Negative for difficulty urinating.  Musculoskeletal:  Negative for arthralgias and myalgias.  Skin:  Negative for rash.  Neurological:  Negative for headaches.  Psychiatric/Behavioral:  Negative for sleep disturbance.     Objective:  BP (!) 155/74   Pulse 68  Temp 98.2 F (36.8 C)   Ht 5' 6 (1.676 m)   Wt 164 lb (74.4 kg)   SpO2 99%   BMI 26.47 kg/m   BP Readings from Last 3 Encounters:  03/02/24 (!) 155/74  12/09/23 (!) 170/69  10/09/23 (!) 174/75    Wt Readings from Last 3 Encounters:  03/02/24 164 lb (74.4 kg)  12/09/23 166 lb (75.3 kg)  10/09/23 169 lb (76.7 kg)      Physical Exam Vitals reviewed.  Constitutional:      Appearance: He is well-developed.  HENT:     Head: Normocephalic and atraumatic.     Right Ear: External ear normal.     Left Ear: External ear normal.     Mouth/Throat:     Pharynx: No oropharyngeal exudate or posterior oropharyngeal erythema.  Eyes:     Pupils: Pupils are equal, round, and reactive to light.  Cardiovascular:     Rate and Rhythm: Normal rate and regular rhythm.     Heart sounds: No murmur heard. Pulmonary:     Effort: No respiratory distress.     Breath sounds: Normal breath sounds.  Musculoskeletal:     Cervical back: Normal range of motion and neck supple.  Neurological:     Mental Status: He is alert and oriented to person, place, and time.      Assessment & Plan:   1. Type 2 diabetes mellitus without complication, with long-term current use of insulin  (HCC)   2. Mixed hyperlipidemia   3. Encounter for immunization   4. Benign essential hypertension     Meds ordered this encounter  Medications   amLODipine  (NORVASC ) 5 MG tablet    Sig: Take 1 tablet (5 mg total) by mouth daily. For blood pressure    Dispense:  90 tablet    Refill:  1    Orders Placed This Encounter  Procedures   Flu vaccine HIGH DOSE PF(Fluzone Trivalent)   Bayer DCA Hb A1c Waived   CMP14+EGFR   Lipid panel    Follow up 3 mos  Butler Der, MD   Assessment & Plan    Follow-up: Return in about 3 months (around 06/01/2024).  Butler Der, M.D.

## 2024-03-03 LAB — PSA: Prostate Specific Ag, Serum: <0.1 ng/mL (ref 0.0–4.0)

## 2024-03-03 LAB — CMP14+EGFR
ALT: 16 IU/L (ref 0–44)
AST: 16 IU/L (ref 0–40)
Albumin: 4.3 g/dL (ref 3.7–4.7)
Alkaline Phosphatase: 106 IU/L (ref 48–129)
BUN/Creatinine Ratio: 13 (ref 10–24)
BUN: 14 mg/dL (ref 8–27)
Bilirubin Total: 0.6 mg/dL (ref 0.0–1.2)
CO2: 23 mmol/L (ref 20–29)
Calcium: 9.9 mg/dL (ref 8.6–10.2)
Chloride: 103 mmol/L (ref 96–106)
Creatinine, Ser: 1.12 mg/dL (ref 0.76–1.27)
Globulin, Total: 2.3 g/dL (ref 1.5–4.5)
Glucose: 84 mg/dL (ref 70–99)
Potassium: 4.7 mmol/L (ref 3.5–5.2)
Sodium: 142 mmol/L (ref 134–144)
Total Protein: 6.6 g/dL (ref 6.0–8.5)
eGFR: 66 mL/min/1.73 (ref >59)

## 2024-03-03 LAB — LIPID PANEL
Chol/HDL Ratio: 2.3 ratio (ref 0.0–5.0)
Cholesterol, Total: 114 mg/dL (ref 100–199)
HDL: 50 mg/dL (ref >39)
LDL Chol Calc (NIH): 48 mg/dL (ref 0–99)
Triglycerides: 79 mg/dL (ref 0–149)
VLDL Cholesterol Cal: 16 mg/dL (ref 5–40)

## 2024-03-07 ENCOUNTER — Ambulatory Visit: Payer: Self-pay | Admitting: Family Medicine

## 2024-03-07 NOTE — Progress Notes (Signed)
Hello Patrick Moss,  Your lab result is normal and/or stable.Some minor variations that are not significant are commonly marked abnormal, but do not represent any medical problem for you.  Best regards, Kaimana Neuzil, M.D.

## 2024-03-08 ENCOUNTER — Encounter: Payer: Self-pay | Admitting: Family Medicine

## 2024-03-10 ENCOUNTER — Telehealth: Payer: Self-pay | Admitting: Family Medicine

## 2024-03-10 NOTE — Telephone Encounter (Signed)
 Copied from CRM #[id number]. Topic: Clinical - Medication Question >> Mar 10, 2024  1:15 PM Carlatta H wrote: Reason for CRM: Patient stated he was supposed to be prescribed a new medication that he never prescribed//Please send prescription to [facility] - [place], [place] - [address] [place] [address] Phone: [phone] Fax: [phone] Hours: Not open 24 hours

## 2024-03-11 NOTE — Telephone Encounter (Signed)
 Please tell him I have no recollection of that. If he can tell the nurse  what he remembers about it I will try to figure it out.

## 2024-03-11 NOTE — Telephone Encounter (Signed)
 Pt was told at office visit that Dr. Zollie was going to add another BP med. Pt currently takes Olmesartan  40mg  every day.  At 9/17 visit BP was 155/74. Pt denies any symptoms. Aware to increase water intake, decrease salt intake.  Informed that Dr. Zollie will address on Monday. Pt needs Rx sent to [facility].

## 2024-03-14 MED ORDER — AMLODIPINE BESYLATE 5 MG PO TABS: 5.0000 mg | ORAL_TABLET | Freq: Every day | ORAL | 1 refills | Status: DC

## 2024-03-14 NOTE — Addendum Note (Signed)
 Addended by: ZOLLIE LOWERS on: 03/14/2024 11:47 AM   Modules accepted: Orders

## 2024-03-14 NOTE — Progress Notes (Signed)
 Impression/Assessment:  1.  Grade group 3 prostate cancer, status post ST ADT/EBRT.  Excellent PSA response thus far with no significant radiation-related sequelae  2.  BPH with moderate symptomatology on tamsulosin , doing well.  Plan:  I will see him back in 6 months with his brother, following PSA   History of Present Illness: Patrick Moss is here today for continued follow-up of prostate cancer, following curative therapy.  6.13.2023: He underwent ultrasound and biopsy of the prostate.  PSA 13.2, prostate volume 105 mL, PSA density 0.13.  Biopsy results as follows:    9/12 cores positive-- 1 core  revealed GG 1 pattern 1 core revealed GG 2 pattern 7 cores revealed GG 3 pattern  Perineural invasion was found in 4 of the cores.  PET CT scan was performed on 6.22.2023.  This revealed no evidence of extra prostatic disease.   8.22.2023: ST ADT commenced-the patient received Firmagon  240 mg.   9.5.2023: Underwent placement of fiducial markers and SpaceOAR.  9.26.2023: Received a 69-month leuprolide  injection.   10.2.2023 - 04/25/2022-received 28 fractions of EBRT  11.22.2023: Admitted for management of a CVA  3.25.2025: He has been doing well.  He does have somewhat of a slow stream, and would like to consider medical therapy.  He has not seen blood in his urine or stool.  PSA 6 months ago was less than 0.1.  9.30.2025: Here today for routine check.  Over the past 6 months he has been doing well.  No urinary complaints.  He has not noted blood in his urine or stool.  IPSS 3/1.  Most recent PSA <0.1   Past Medical History:  Diagnosis Date   Acute ischemic stroke (HCC) 05/07/2022   Borderline glaucoma of both eyes    BPH associated with nocturia    CKD (chronic kidney disease), stage II    Complication of anesthesia    per pt emergent hiccups   Hyperlipidemia, mixed    Hypertension    Malignant neoplasm prostate (HCC) 11/2021   primary urologist---  dr dahlstadt/  radiation  oncology [facility];  dx 06/ 2023,  Gleason 4+3,  PSA  13.2   OA (osteoarthritis)    knees   Type 2 diabetes mellitus treated with insulin  (HCC)    followed by pcp ( previous seen by endocriologist--- dr g. nida ,lov note in epic 02-19-2021);    (02-11-2022  pt stated checks blood sugar twice daily,  fasting sugar average 90s  to 120s)   Wears dentures    upper   Wears glasses     Past Surgical History:  Procedure Laterality Date   FEMUR IM NAIL Right 05/03/2021   @[id number] --  [place]   FOOT FRACTURE SURGERY Right    2017;   ORIF , per pt plate under foot   GOLD SEED IMPLANT N/A 02/18/2022   Procedure: GOLD SEED IMPLANT;  Surgeon: Rosalind Zachary NOVAK, MD;  Location: [facility];  Service: Urology;  Laterality: N/A;  30 MINS   INGUINAL HERNIA REPAIR Bilateral 2007   approx   IR ANGIO INTRA EXTRACRAN SEL COM CAROTID INNOMINATE UNI R MOD SED  05/14/2022   IR ANGIO VERTEBRAL SEL VERTEBRAL UNI R MOD SED  05/14/2022   IR INTRAVSC STENT CERV CAROTID W/EMB-PROT MOD SED INCL ANGIO  05/14/2022   IR RADIOLOGIST EVAL & MGMT  06/03/2022   RADIOLOGY WITH ANESTHESIA N/A 05/14/2022   Procedure: R ICA stent;  Surgeon: Dolphus Carrion, MD;  Location: [facility] OR;  Service: Radiology;  Laterality: N/A;   SPACE OAR INSTILLATION N/A 02/18/2022   Procedure: SPACE OAR INSTILLATION;  Surgeon: Rosalind Zachary NOVAK, MD;  Location: [facility];  Service: Urology;  Laterality: N/A;    Home Medications:  Allergies as of 03/15/2024       Reactions   Septra [sulfamethoxazole-trimethoprim] Hives        Medication List        Accurate as of March 14, 2024  9:04 AM. If you have any questions, ask your nurse or doctor.          aspirin  81 MG chewable tablet Chew 1 tablet (81 mg total) by mouth daily.   B-D ULTRAFINE III SHORT PEN 31G X 8 MM Misc Generic drug: Insulin  Pen Needle USE  1  PEN  NEEDLE AS DIRECTED   Basaglar  KwikPen 100 UNIT/ML Inject 20 Units into  the skin daily.   BLOOD GLUCOSE TEST STRIPS Strp 1 each by In Vitro route in the morning, at noon, and at bedtime. May substitute to any manufacturer covered by patient's insurance.   calcium  carbonate 1500 (600 Ca) MG Tabs tablet Commonly known as: OSCAL Take 1,500 mg by mouth 2 (two) times daily with a meal.   fexofenadine  180 MG tablet Commonly known as: ALLEGRA  Take 1 tablet (180 mg total) by mouth daily. For allergy symptoms   metFORMIN  500 MG 24 hr tablet Commonly known as: GLUCOPHAGE -XR TAKE 1 TABLET EVERY DAY WITH BREAKFAST   olmesartan  40 MG tablet Commonly known as: Benicar  Take 1 tablet (40 mg total) by mouth daily. For blood pressure   omega-3 fish oil 1000 MG Caps capsule Commonly known as: MAXEPA Take 1 capsule by mouth daily.   pantoprazole  40 MG tablet Commonly known as: PROTONIX  TAKE 1 TABLET EVERY DAY   rosuvastatin  5 MG tablet Commonly known as: CRESTOR  TAKE 1 TABLET EVERY DAY   tamsulosin  0.4 MG Caps capsule Commonly known as: FLOMAX  Take 1 capsule (0.4 mg total) by mouth daily after supper.   True Metrix Air Glucose Meter w/Device Kit Test BS in the morning, at noon and at night Dx E11.9   Trulicity  1.5 MG/0.5ML Soaj Generic drug: Dulaglutide  Inject 1.5 mg into the skin once a week. Saturday's   Vitamin D3 50 MCG (2000 UT) Tabs Take 1 tablet (50 mcg total) by mouth daily.        Allergies:  Allergies  Allergen Reactions   Septra [Sulfamethoxazole-Trimethoprim] Hives    Family History  Problem Relation Age of Onset   Diabetes Mother    Stroke Mother    Diabetes Father    Stroke Father    Diabetes Brother     Social History:  reports that he has never smoked. He has never used smokeless tobacco. He reports that he does not drink alcohol and does not use drugs.  ROS: A complete review of systems was performed.  All systems are negative except for pertinent findings as noted.  Physical Exam:  Vital signs in last 24 hours: There  were no vitals taken for this visit. Constitutional:  Alert and oriented, No acute distress Cardiovascular: Regular rate  Respiratory: Normal respiratory effort Neurologic: Grossly intact, no focal deficits Psychiatric: Normal mood and affect  I have reviewed prior pt notes  I have reviewed urinalysis results--clear  I have independently reviewed prior imaging--prostate ultrasound/volume  I have reviewed most recent PSA-less than 0.1  Pathology results reviewed  IPSS sheet reviewed

## 2024-03-14 NOTE — Telephone Encounter (Signed)
 Please give him my apology for forgetting that part of our discussion. I sent the medicine to [facility].

## 2024-03-15 ENCOUNTER — Ambulatory Visit: Admitting: Urology

## 2024-03-15 VITALS — BP 145/75 | HR 80

## 2024-03-15 DIAGNOSIS — Z8546 Personal history of malignant neoplasm of prostate: Secondary | ICD-10-CM | POA: Diagnosis not present

## 2024-03-15 DIAGNOSIS — N401 Enlarged prostate with lower urinary tract symptoms: Secondary | ICD-10-CM | POA: Diagnosis not present

## 2024-03-15 LAB — URINALYSIS, ROUTINE W REFLEX MICROSCOPIC
Bilirubin, UA: NEGATIVE
Glucose, UA: NEGATIVE
Ketones, UA: NEGATIVE
Leukocytes,UA: NEGATIVE
Nitrite, UA: NEGATIVE
RBC, UA: NEGATIVE
Specific Gravity, UA: 1.015 (ref 1.005–1.030)
Urobilinogen, Ur: 0.2 mg/dL (ref 0.2–1.0)
pH, UA: 6.5 (ref 5.0–7.5)

## 2024-03-15 NOTE — Telephone Encounter (Signed)
 Left detailed message making patient aware and advised for patient to call back if he had any questions.

## 2024-03-16 ENCOUNTER — Telehealth: Payer: Self-pay | Admitting: Family Medicine

## 2024-03-16 NOTE — Telephone Encounter (Signed)
 PT needs appt to see Mliss for his trulicity 

## 2024-03-18 NOTE — Telephone Encounter (Signed)
 Please call Darice Minerva to reach him at #[phone]. His phone is not working. He needs an appt w/Julie.

## 2024-03-22 ENCOUNTER — Telehealth: Payer: Self-pay

## 2024-03-22 NOTE — Progress Notes (Signed)
 Complex Care Management Care Guide Note  03/22/2024 Name: Patrick Moss MRN: [id number] DOB: [birth year]  Patrick Moss is a 83 y.o. year old male who is a primary care patient of [name], Butler, MD and is actively engaged with the care management team. I reached out to Patrick Moss by phone today to assist with scheduling  with the Pharmacist.  Follow up plan: Unsuccessful telephone outreach attempt made. A HIPAA compliant phone message was left for the patient providing contact information and requesting a return call.  Jeoffrey Buffalo , RMA     [facility]  [facility], [facility] Guide  Direct Dial: [phone]  Website: [email or web address]

## 2024-03-27 ENCOUNTER — Other Ambulatory Visit: Payer: Self-pay | Admitting: Family Medicine

## 2024-03-30 ENCOUNTER — Ambulatory Visit (INDEPENDENT_AMBULATORY_CARE_PROVIDER_SITE_OTHER): Admitting: *Deleted

## 2024-03-30 ENCOUNTER — Telehealth: Payer: Self-pay | Admitting: Family Medicine

## 2024-03-30 DIAGNOSIS — E538 Deficiency of other specified B group vitamins: Secondary | ICD-10-CM

## 2024-03-30 NOTE — Telephone Encounter (Signed)
 PT needs appt to get trulicity  rx

## 2024-03-30 NOTE — Progress Notes (Signed)
 Complex Care Management Care Guide Note  03/30/2024 Name: Patrick Moss MRN: [id number] DOB: 08/03/1940  Patrick Moss is a 83 y.o. year old male who is a primary care patient of [name], Butler, MD and is actively engaged with the care management team. I reached out to Patrick Moss by phone today to assist with scheduling  with the Pharmacist.  Follow up plan: Unsuccessful telephone outreach attempt made. A HIPAA compliant phone message was left for the patient providing contact information and requesting a return call.  Jeoffrey Buffalo , RMA     [facility]  [facility], [facility] Guide  Direct Dial: [phone]  Website: [email or web address]

## 2024-03-30 NOTE — Progress Notes (Signed)
 Complex Care Management Care Guide Note  03/30/2024 Name: Patrick Moss MRN: [id number] DOB: 30-Jun-1940  Patrick Moss is a 83 y.o. year old male who is a primary care patient of [name], Butler, MD and is actively engaged with the care management team. I reached out to Patrick Moss by phone today to assist with scheduling  with the Pharmacist.  Follow up plan: Telephone appointment with complex care management team member scheduled for:  04/07/2024  Patrick Moss , RMA     [place]  [facility] Guide  Direct Dial: [phone]  Website: [email or web address]

## 2024-03-30 NOTE — Progress Notes (Signed)
 Patient is in office today for a nurse visit for B12 Injection. Patient Injection was given in the  Left deltoid. Patient tolerated injection well.

## 2024-03-31 ENCOUNTER — Encounter: Payer: Self-pay | Admitting: Family Medicine

## 2024-03-31 DIAGNOSIS — E119 Type 2 diabetes mellitus without complications: Secondary | ICD-10-CM | POA: Diagnosis not present

## 2024-03-31 DIAGNOSIS — Z961 Presence of intraocular lens: Secondary | ICD-10-CM | POA: Diagnosis not present

## 2024-03-31 DIAGNOSIS — H0102A Squamous blepharitis right eye, upper and lower eyelids: Secondary | ICD-10-CM | POA: Diagnosis not present

## 2024-03-31 DIAGNOSIS — H0102B Squamous blepharitis left eye, upper and lower eyelids: Secondary | ICD-10-CM | POA: Diagnosis not present

## 2024-03-31 DIAGNOSIS — H40023 Open angle with borderline findings, high risk, bilateral: Secondary | ICD-10-CM | POA: Diagnosis not present

## 2024-03-31 LAB — OPHTHALMOLOGY REPORT-SCANNED

## 2024-04-07 ENCOUNTER — Other Ambulatory Visit

## 2024-04-14 ENCOUNTER — Telehealth: Payer: Self-pay

## 2024-04-14 NOTE — Telephone Encounter (Signed)
 Rec'd [employer] re-enrollment application for patients Trulicity  and Basaglar . In progress of completing provider pages.

## 2024-04-14 NOTE — Telephone Encounter (Signed)
 PAP: RE-ENROLLMENT application for Basaglar  and Trulicity  has been submitted to [employer], via fax.

## 2024-04-18 ENCOUNTER — Other Ambulatory Visit: Payer: Self-pay | Admitting: *Deleted

## 2024-04-18 DIAGNOSIS — E782 Mixed hyperlipidemia: Secondary | ICD-10-CM

## 2024-04-18 DIAGNOSIS — N401 Enlarged prostate with lower urinary tract symptoms: Secondary | ICD-10-CM

## 2024-04-18 NOTE — Telephone Encounter (Signed)
 PAP: Patient assistance application for Basaglar  and Trulicity  has been approved by PAP Companies: [employer] from 06/16/24 to 06/15/25.   Medication should be delivered to: Home.   For further shipping updates, please contact [employer] at [phone]. Patient ID is: [phone]

## 2024-05-04 ENCOUNTER — Ambulatory Visit (INDEPENDENT_AMBULATORY_CARE_PROVIDER_SITE_OTHER): Payer: Self-pay | Admitting: *Deleted

## 2024-05-04 DIAGNOSIS — E538 Deficiency of other specified B group vitamins: Secondary | ICD-10-CM | POA: Diagnosis not present

## 2024-05-04 NOTE — Progress Notes (Signed)
 Patient is in office today for a nurse visit for B12 Injection. Patient Injection was given in the  Right deltoid. Patient tolerated injection well.

## 2024-05-17 ENCOUNTER — Other Ambulatory Visit: Payer: Self-pay | Admitting: Family Medicine

## 2024-06-01 ENCOUNTER — Ambulatory Visit: Payer: Self-pay | Admitting: Family Medicine

## 2024-06-01 ENCOUNTER — Encounter: Payer: Self-pay | Admitting: Family Medicine

## 2024-06-01 VITALS — BP 128/73 | HR 71 | Temp 98.0°F | Ht 66.0 in | Wt 163.0 lb

## 2024-06-01 DIAGNOSIS — E782 Mixed hyperlipidemia: Secondary | ICD-10-CM

## 2024-06-01 DIAGNOSIS — N401 Enlarged prostate with lower urinary tract symptoms: Secondary | ICD-10-CM | POA: Diagnosis not present

## 2024-06-01 DIAGNOSIS — Z794 Long term (current) use of insulin: Secondary | ICD-10-CM

## 2024-06-01 DIAGNOSIS — E538 Deficiency of other specified B group vitamins: Secondary | ICD-10-CM | POA: Diagnosis not present

## 2024-06-01 DIAGNOSIS — E1159 Type 2 diabetes mellitus with other circulatory complications: Secondary | ICD-10-CM | POA: Diagnosis not present

## 2024-06-01 DIAGNOSIS — I152 Hypertension secondary to endocrine disorders: Secondary | ICD-10-CM | POA: Diagnosis not present

## 2024-06-01 DIAGNOSIS — E119 Type 2 diabetes mellitus without complications: Secondary | ICD-10-CM

## 2024-06-01 LAB — BAYER DCA HB A1C WAIVED: HB A1C (BAYER DCA - WAIVED): 5.7 % — ABNORMAL HIGH (ref 4.8–5.6)

## 2024-06-01 MED ORDER — TAMSULOSIN HCL 0.4 MG PO CAPS: 0.4000 mg | ORAL_CAPSULE | Freq: Every day | ORAL | 3 refills | Status: AC

## 2024-06-01 NOTE — Progress Notes (Signed)
 Subjective:  Patient ID: Patrick Moss, male    DOB: Oct 30, 1940  Age: 83 y.o. MRN: [id number]  CC: Medical Management of Chronic Issues (Wants b12 shot)   HPI  Discussed the use of AI scribe software for clinical note transcription with the patient, who gave verbal consent to proceed.  History of Present Illness Patrick Moss is an 83 year old male who presents for medication management and follow-up.  He has a history of prostate cancer, which previously caused difficulty urinating. He was prescribed tamsulosin  by his urologist to alleviate these symptoms and takes one tablet daily, which he states works well for him. Initially, he had difficulty obtaining the medication.  He mentions having had COVID-19 a couple of years ago, which he describes as a severe experience, stating it 'like to kill me.' He is cautious about exposure and discusses mask-wearing practices.  He monitors his blood sugar levels, which he reports have been stable, typically around 80 mg/dL in the mornings. No symptoms of hypoglycemia such as weakness or shakiness.  No current stomach issues and his medications are agreeing with him.  A nurse from his insurance company visited his home last week for a routine check-up.    in for follow-up of elevated cholesterol. Doing well without complaints on current medication. Denies side effects of statin including myalgia and arthralgia and nausea. Currently no chest pain, shortness of breath or other cardiovascular related symptoms noted.   presents for  follow-up of hypertension. Patient has no history of headache chest pain or shortness of breath or recent cough. Patient also denies symptoms of TIA such as focal numbness or weakness. Patient denies side effects from medication. States taking it regularly.  Patient in for follow-up of GERD. Currently asymptomatic taking  PPI daily. There is no chest pain or heartburn. No hematemesis and no melena. No dysphagia or  choking. Onset is remote. Progression is stable. Complicating factors, none.       06/01/2024    9:56 AM 03/02/2024   11:37 AM 10/09/2023    8:56 AM  Depression screen PHQ 2/9  Decreased Interest 0 0 0  Down, Depressed, Hopeless 0 0 0  PHQ - 2 Score 0 0 0  Altered sleeping 0  0  Tired, decreased energy 0  0  Change in appetite 0  0  Feeling bad or failure about yourself  0  0  Trouble concentrating 0  0  Moving slowly or fidgety/restless 0  0  Suicidal thoughts 0  0  PHQ-9 Score 0  0   Difficult doing work/chores Not difficult at all  Not difficult at all     Data saved with a previous flowsheet row definition    History Da has a past medical history of Acute ischemic stroke (HCC) (05/07/2022), Borderline glaucoma of both eyes, BPH associated with nocturia, CKD (chronic kidney disease), stage II, Complication of anesthesia, Hyperlipidemia, mixed, Hypertension, Malignant neoplasm prostate (HCC) (11/2021), OA (osteoarthritis), Type 2 diabetes mellitus treated with insulin  (HCC), Wears dentures, and Wears glasses.   He has a past surgical history that includes Femur IM nail (Right, 05/03/2021); Foot fracture surgery (Right); Inguinal hernia repair (Bilateral, 2007); Gold seed implant (N/A, 02/18/2022); SPACE OAR INSTILLATION (N/A, 02/18/2022); Radiology with anesthesia (N/A, 05/14/2022); IR ANGIO INTRA EXTRACRAN SEL COM CAROTID INNOMINATE UNI R MOD SED (05/14/2022); IR INTRAVSC STENT CERV CAROTID W/EMB-PROT MOD SED (05/14/2022); IR ANGIO VERTEBRAL SEL VERTEBRAL UNI R MOD SED (05/14/2022); and IR Radiologist Eval & Mgmt (06/03/2022).  His family history includes Diabetes in his brother, father, and mother; Stroke in his father and mother.He reports that he has never smoked. He has never used smokeless tobacco. He reports that he does not drink alcohol and does not use drugs.    ROS Review of Systems  Constitutional: Negative.   HENT: Negative.    Eyes:  Negative for visual  disturbance.  Respiratory:  Negative for cough and shortness of breath.   Cardiovascular:  Negative for chest pain and leg swelling.  Gastrointestinal:  Negative for abdominal pain, diarrhea, nausea and vomiting.  Genitourinary:  Negative for difficulty urinating.  Musculoskeletal:  Negative for arthralgias and myalgias.  Skin:  Negative for rash.  Neurological:  Negative for headaches.  Psychiatric/Behavioral:  Negative for sleep disturbance.     Objective:  BP 128/73   Pulse 71   Temp 98 F (36.7 C)   Ht 5' 6 (1.676 m)   Wt 163 lb (73.9 kg)   SpO2 99%   BMI 26.31 kg/m   BP Readings from Last 3 Encounters:  06/01/24 128/73  03/15/24 (!) 145/75  03/02/24 (!) 155/74    Wt Readings from Last 3 Encounters:  06/01/24 163 lb (73.9 kg)  03/02/24 164 lb (74.4 kg)  12/09/23 166 lb (75.3 kg)     Physical Exam Physical Exam GENERAL: Alert, cooperative, well developed, no acute distress. HEENT: Normocephalic, normal oropharynx, moist mucous membranes. CHEST: Clear to auscultation bilaterally, no wheezes, rhonchi, or crackles. CARDIOVASCULAR: Normal heart rate and rhythm, S1 and S2 normal without murmurs. ABDOMEN: Soft, non-tender, non-distended, without organomegaly, normal bowel sounds. EXTREMITIES: No cyanosis or edema. NEUROLOGICAL: Cranial nerves grossly intact, moves all extremities without gross motor or sensory deficit.   Assessment & Plan:  Type 2 diabetes mellitus without complication, with long-term current use of insulin  (HCC) -     Bayer DCA Hb A1c Waived  Benign prostatic hyperplasia with lower urinary tract symptoms, symptom details unspecified -     Tamsulosin  HCl; Take 1 capsule (0.4 mg total) by mouth daily after supper. For urine flow  Dispense: 90 capsule; Refill: 3  Mixed hyperlipidemia  Hypertension associated with diabetes (HCC)    Assessment and Plan Assessment & Plan Type 2 diabetes mellitus   Blood glucose levels are well-controlled,  averaging 80 mg/dL in the mornings without hypoglycemia symptoms. Continue current diabetes management plan.  Benign prostatic hyperplasia with lower urinary tract symptoms   He experiences difficulty urinating due to benign prostatic hyperplasia. Previously, he had trouble obtaining tamsulosin , but now takes it once daily with effective results. Continue tamsulosin  once daily. Refilled tamsulosin  prescription for one year and sent to pharmacy.  History of prostate cancer   Prostate cancer is in remission with a recent PSA level less than 0.1, indicating no current evidence of disease.       Follow-up: Return in about 3 months (around 08/30/2024).  Butler Der, M.D.

## 2024-06-26 ENCOUNTER — Other Ambulatory Visit: Payer: Self-pay | Admitting: Family Medicine

## 2024-06-26 DIAGNOSIS — E119 Type 2 diabetes mellitus without complications: Secondary | ICD-10-CM

## 2024-07-01 ENCOUNTER — Other Ambulatory Visit: Payer: Self-pay | Admitting: Family Medicine

## 2024-07-01 DIAGNOSIS — E782 Mixed hyperlipidemia: Secondary | ICD-10-CM

## 2024-07-06 ENCOUNTER — Ambulatory Visit: Admitting: *Deleted

## 2024-07-06 DIAGNOSIS — E538 Deficiency of other specified B group vitamins: Secondary | ICD-10-CM

## 2024-07-06 MED ORDER — CYANOCOBALAMIN 1000 MCG/ML IJ SOLN
1000.0000 ug | INTRAMUSCULAR | Status: AC
  Administered 2024-07-06: 1000 ug via INTRAMUSCULAR

## 2024-07-06 NOTE — Progress Notes (Signed)
 Patient is in office today for a nurse visit for B12 Injection. Patient Injection was given in the  Right deltoid. Patient tolerated injection well.

## 2024-07-21 ENCOUNTER — Other Ambulatory Visit: Payer: Self-pay | Admitting: Family Medicine

## 2024-07-22 ENCOUNTER — Other Ambulatory Visit: Payer: Self-pay | Admitting: Family Medicine

## 2024-07-22 DIAGNOSIS — E782 Mixed hyperlipidemia: Secondary | ICD-10-CM

## 2024-08-10 ENCOUNTER — Ambulatory Visit

## 2024-08-31 ENCOUNTER — Ambulatory Visit: Admitting: Family Medicine

## 2024-09-13 ENCOUNTER — Ambulatory Visit: Admitting: Urology
